# Patient Record
Sex: Male | Born: 1970
Health system: Southern US, Community
[De-identification: ages and names within clinical notes are randomized; demographics above are authoritative.]

## PROBLEM LIST (undated history)

## (undated) DIAGNOSIS — D582 Other hemoglobinopathies: Secondary | ICD-10-CM

## (undated) DIAGNOSIS — F32A Depression, unspecified: Secondary | ICD-10-CM

## (undated) DIAGNOSIS — F419 Anxiety disorder, unspecified: Secondary | ICD-10-CM

## (undated) DIAGNOSIS — R7989 Other specified abnormal findings of blood chemistry: Secondary | ICD-10-CM

## (undated) DIAGNOSIS — G4730 Sleep apnea, unspecified: Secondary | ICD-10-CM

## (undated) DIAGNOSIS — F329 Major depressive disorder, single episode, unspecified: Secondary | ICD-10-CM

## (undated) DIAGNOSIS — K56609 Unspecified intestinal obstruction, unspecified as to partial versus complete obstruction: Secondary | ICD-10-CM

## (undated) HISTORY — DX: Other hemoglobinopathies: D58.2

## (undated) HISTORY — PX: WISDOM TOOTH EXTRACTION: SHX21

## (undated) HISTORY — PX: LUMBAR SPINE SURGERY: SHX701

## (undated) HISTORY — DX: Sleep apnea, unspecified: G47.30

## (undated) HISTORY — DX: Anxiety disorder, unspecified: F41.9

## (undated) HISTORY — DX: Other specified abnormal findings of blood chemistry: R79.89

## (undated) HISTORY — DX: Major depressive disorder, single episode, unspecified: F32.9

## (undated) HISTORY — DX: Depression, unspecified: F32.A

---

## 2005-05-04 ENCOUNTER — Other Ambulatory Visit: Payer: Self-pay

## 2005-05-04 ENCOUNTER — Emergency Department: Payer: Self-pay | Admitting: General Practice

## 2013-01-06 ENCOUNTER — Telehealth: Payer: Self-pay | Admitting: Nurse Practitioner

## 2013-01-06 NOTE — Telephone Encounter (Signed)
Weakness and fatigue x 1 week.  Denies other symptoms.    Requested Bennie Pierini, FNP.  Appt scheduled for tomorrow.

## 2013-01-07 ENCOUNTER — Ambulatory Visit (INDEPENDENT_AMBULATORY_CARE_PROVIDER_SITE_OTHER): Payer: Medicaid Other | Admitting: Nurse Practitioner

## 2013-01-07 ENCOUNTER — Encounter: Payer: Self-pay | Admitting: Nurse Practitioner

## 2013-01-07 VITALS — BP 127/78 | HR 109 | Temp 99.8°F | Ht 75.0 in | Wt 266.0 lb

## 2013-01-07 DIAGNOSIS — F329 Major depressive disorder, single episode, unspecified: Secondary | ICD-10-CM

## 2013-01-07 DIAGNOSIS — R5381 Other malaise: Secondary | ICD-10-CM

## 2013-01-07 DIAGNOSIS — R5383 Other fatigue: Secondary | ICD-10-CM

## 2013-01-07 NOTE — Patient Instructions (Signed)
Sleep Apnea  Sleep apnea is a sleep disorder characterized by abnormal pauses in breathing while you sleep. When your breathing pauses, the level of oxygen in your blood decreases. This causes you to move out of deep sleep and into light sleep. As a result, your quality of sleep is poor, and the system that carries your blood throughout your body (cardiovascular system) experiences stress. If sleep apnea remains untreated, the following conditions can develop:  High blood pressure (hypertension).  Coronary artery disease.  Inability to achieve or maintain an erection (impotence).  Impairment of your thought process (cognitive dysfunction). There are three types of sleep apnea: 1. Obstructive sleep apnea Pauses in breathing during sleep because of a blocked airway. 2. Central sleep apnea Pauses in breathing during sleep because the area of the brain that controls your breathing does not send the correct signals to the muscles that control breathing. 3. Mixed sleep apnea A combination of both obstructive and central sleep apnea. RISK FACTORS The following risk factors can increase your risk of developing sleep apnea:  Being overweight.  Smoking.  Having narrow passages in your nose and throat.  Being of older age.  Being male.  Alcohol use.  Sedative and tranquilizer use.  Ethnicity. Among individuals younger than 35 years, African Americans are at increased risk of sleep apnea. SYMPTOMS   Difficulty staying asleep.  Daytime sleepiness and fatigue.  Loss of energy.  Irritability.  Loud, heavy snoring.  Morning headaches.  Trouble concentrating.  Forgetfulness.  Decreased interest in sex. DIAGNOSIS  In order to diagnose sleep apnea, your caregiver will perform a physical examination. Your caregiver may suggest that you take a home sleep test. Your caregiver may also recommend that you spend the night in a sleep lab. In the sleep lab, several monitors record  information about your heart, lungs, and brain while you sleep. Your leg and arm movements and blood oxygen level are also recorded. TREATMENT The following actions may help to resolve mild sleep apnea:  Sleeping on your side.   Using a decongestant if you have nasal congestion.   Avoiding the use of depressants, including alcohol, sedatives, and narcotics.   Losing weight and modifying your diet if you are overweight. There also are devices and treatments to help open your airway:  Oral appliances. These are custom-made mouthpieces that shift your lower jaw forward and slightly open your bite. This opens your airway.  Devices that create positive airway pressure. This positive pressure "splints" your airway open to help you breathe better during sleep. The following devices create positive airway pressure:  Continuous positive airway pressure (CPAP) device. The CPAP device creates a continuous level of air pressure with an air pump. The air is delivered to your airway through a mask while you sleep. This continuous pressure keeps your airway open.  Nasal expiratory positive airway pressure (EPAP) device. The EPAP device creates positive air pressure as you exhale. The device consists of single-use valves, which are inserted into each nostril and held in place by adhesive. The valves create very little resistance when you inhale but create much more resistance when you exhale. That increased resistance creates the positive airway pressure. This positive pressure while you exhale keeps your airway open, making it easier to breath when you inhale again.  Bilevel positive airway pressure (BPAP) device. The BPAP device is used mainly in patients with central sleep apnea. This device is similar to the CPAP device because it also uses an air pump to deliver   continuous air pressure through a mask. However, with the BPAP machine, the pressure is set at two different levels. The pressure when you  exhale is lower than the pressure when you inhale.  Surgery. Typically, surgery is only done if you cannot comply with less invasive treatments or if the less invasive treatments do not improve your condition. Surgery involves removing excess tissue in your airway to create a wider passage way. Document Released: 09/06/2002 Document Revised: 03/17/2012 Document Reviewed: 01/23/2012 [employer] Patient Information 2013 [employer].  

## 2013-01-07 NOTE — Progress Notes (Signed)
  Subjective:    Patient ID: Emmanuelle Coxe, male    DOB: Oct 15, 1970, 42 y.o.   MRN: [id number]  HPI-Patient in C/O fatigue. Feels very weak. Stays sleepy all the time even though he sleeps all night long. Says that he does snore at night. Patient states that he just never feels rested.    Review of Systems  Constitutional: Positive for fatigue.  HENT: Negative.   Eyes: Negative.   Respiratory: Negative.   Cardiovascular: Negative.   Gastrointestinal: Positive for abdominal distention.  Genitourinary: Negative.   Musculoskeletal: Negative.   Neurological: Negative.   Psychiatric/Behavioral: Negative.        Patient feels that his depression is under control.       Objective:   Physical Exam  Constitutional: He is oriented to person, place, and time. He appears well-developed and well-nourished.  Cardiovascular: Normal rate, regular rhythm and intact distal pulses.   No murmur heard. Pulmonary/Chest: Effort normal and breath sounds normal.  Abdominal: Soft. Bowel sounds are normal.  Neurological: He is alert and oriented to person, place, and time.  Skin: Skin is warm and dry.  Psychiatric: He has a normal mood and affect. His behavior is normal. Judgment and thought content normal.   BP 127/78  Pulse 109  Temp(Src) 99.8 F (37.7 C) (Oral)  Ht 6\' 3"  (1.905 m)  Wt 266 lb (120.657 kg)  BMI 33.25 kg/m2        Assessment & Plan:  Fatigue  Will schedule Sleep study  Labs pending  Will talk once gets all results back   Orders Placed This Encounter  Procedures  . COMPLETE METABOLIC PANEL WITH GFR  . Anemia panel 7  . Thyroid Panel With TSH  . Polysomnography 4 or more parameters     Mary-Margaret Daphine Deutscher, FNP

## 2013-01-08 LAB — ANEMIA PANEL 7
ABS Retic: 32.6 10*3/uL (ref 19.0–186.0)
HCT: 50.1 % (ref 39.0–52.0)
Iron: 66 ug/dL (ref 42–165)
Platelets: 307 10*3/uL (ref 150–400)
RBC.: 5.44 MIL/uL (ref 4.22–5.81)
RBC: 5.44 MIL/uL (ref 4.22–5.81)
Retic Ct Pct: 0.6 % (ref 0.4–2.3)
UIBC: 291 ug/dL (ref 125–400)
Vitamin B-12: 497 pg/mL (ref 211–911)
WBC: 9.1 10*3/uL (ref 4.0–10.5)

## 2013-01-08 LAB — COMPLETE METABOLIC PANEL WITH GFR
BUN: 13 mg/dL (ref 6–23)
CO2: 30 mEq/L (ref 19–32)
Calcium: 10 mg/dL (ref 8.4–10.5)
Chloride: 102 mEq/L (ref 96–112)
Creat: 1.12 mg/dL (ref 0.50–1.35)
GFR, Est African American: >89 mL/min
GFR, Est Non African American: 81 mL/min
Total Bilirubin: 0.7 mg/dL (ref 0.3–1.2)

## 2013-01-08 LAB — THYROID PANEL WITH TSH
Free Thyroxine Index: 2.8 (ref 1.0–3.9)
T3 Uptake: 30.3 % (ref 22.5–37.0)
T4, Total: 9.1 ug/dL (ref 5.0–12.5)
TSH: 2.876 u[IU]/mL (ref 0.350–4.500)

## 2013-01-11 ENCOUNTER — Encounter: Payer: Self-pay | Admitting: Family Medicine

## 2013-01-20 ENCOUNTER — Other Ambulatory Visit (HOSPITAL_COMMUNITY): Payer: Self-pay

## 2013-01-20 ENCOUNTER — Other Ambulatory Visit: Payer: Self-pay

## 2013-01-20 DIAGNOSIS — G4730 Sleep apnea, unspecified: Secondary | ICD-10-CM

## 2013-01-21 ENCOUNTER — Telehealth: Payer: Self-pay | Admitting: Nurse Practitioner

## 2013-01-21 NOTE — Telephone Encounter (Signed)
FIRST AVAIL APPT IS Monday WITH XRAY HERE- THAT WAS OFFERED BUT PT MAY TRY URGENT FIRST.

## 2013-01-25 ENCOUNTER — Ambulatory Visit: Payer: Medicaid Other | Attending: Family Medicine | Admitting: Sleep Medicine

## 2013-01-25 DIAGNOSIS — G4733 Obstructive sleep apnea (adult) (pediatric): Secondary | ICD-10-CM | POA: Insufficient documentation

## 2013-01-25 DIAGNOSIS — G4730 Sleep apnea, unspecified: Secondary | ICD-10-CM

## 2013-01-25 DIAGNOSIS — G4710 Hypersomnia, unspecified: Secondary | ICD-10-CM

## 2013-01-25 DIAGNOSIS — Z6833 Body mass index (BMI) 33.0-33.9, adult: Secondary | ICD-10-CM | POA: Insufficient documentation

## 2013-02-03 NOTE — Procedures (Signed)
[facility] Daeshon Grammatico A. Gerilyn Pilgrim, MD     [email or web address]        NAMELEVESTER, WALDRIDGE                 ACCOUNT NO.:  [id number]  MEDICAL RECORD NO.:  [id number]          PATIENT TYPE:  OUT  LOCATION:  SLEEP LAB                     FACILITY:  [facility]  PHYSICIAN:  Freedom Lopezperez A. Gerilyn Pilgrim, M.D. DATE OF BIRTH:  11-26-1970  DATE OF STUDY:  01/25/2013                           NOCTURNAL POLYSOMNOGRAM  REFERRING PHYSICIAN:  MARY-MARGARET Fesler  REFERRING PHYSICIAN:  Ernestina Penna, M.D.  INDICATION:  A 42 year old, who presents with fatigue, snoring, and obesity.  MEDICATIONS:  Trazodone, clonazepam, prednisone, Cymbalta.  Epworth Sleepiness Scale 10.  BMI 33.  ARCHITECTURAL SUMMARY:  The total recording time is 411 minutes.  Sleep efficiency 70%, sleep latency 27 minutes.  REM latency 105 minutes. Stage N1 4%, N2 51%, N3 31%, and REM sleep 30%.  RESPIRATORY SUMMARY:  Baseline oxygen saturation is 92, lowest saturation 82 during REM sleep.  Diagnostic AHI is 8 and RDI also 8. The events tended to occur during the REM sleep with a REM AHI of 32.  LIMB MOVEMENT SUMMARY:  PLM index 0.  ELECTROCARDIOGRAM SUMMARY:  Average heart rate is 59 with no significant arrhythmias observed.  IMPRESSION:  Mild obstructive sleep apnea syndrome, not requiring positive pressure treatment.  Thanks for this referral.     Lezly Rumpf A. Gerilyn Pilgrim, M.D.    KAD/MEDQ  D:  02/03/2013 09:28:19  T:  02/03/2013 09:40:23  Job:  [id number]

## 2013-02-11 ENCOUNTER — Telehealth: Payer: Self-pay | Admitting: Nurse Practitioner

## 2013-02-11 NOTE — Telephone Encounter (Signed)
Pt aware of sleep study report

## 2013-04-20 ENCOUNTER — Other Ambulatory Visit: Payer: Self-pay | Admitting: Nurse Practitioner

## 2013-04-20 ENCOUNTER — Other Ambulatory Visit (INDEPENDENT_AMBULATORY_CARE_PROVIDER_SITE_OTHER): Payer: Medicaid Other

## 2013-04-20 DIAGNOSIS — R5381 Other malaise: Secondary | ICD-10-CM

## 2013-04-20 DIAGNOSIS — R5383 Other fatigue: Secondary | ICD-10-CM

## 2013-04-20 NOTE — Progress Notes (Signed)
Pt came in for labs only 

## 2013-04-21 ENCOUNTER — Telehealth: Payer: Self-pay | Admitting: Nurse Practitioner

## 2013-04-22 ENCOUNTER — Telehealth: Payer: Self-pay | Admitting: Nurse Practitioner

## 2013-04-22 LAB — TESTOSTERONE, TOTAL AND FREE DIRECT MEASURE: Testosterone: 199 ng/dL — ABNORMAL LOW (ref 300–890)

## 2013-04-23 ENCOUNTER — Ambulatory Visit (INDEPENDENT_AMBULATORY_CARE_PROVIDER_SITE_OTHER): Payer: Medicaid Other | Admitting: Family Medicine

## 2013-04-23 ENCOUNTER — Encounter: Payer: Self-pay | Admitting: Family Medicine

## 2013-04-23 VITALS — BP 136/90 | HR 102 | Temp 98.8°F | Ht 75.0 in | Wt 257.0 lb

## 2013-04-23 DIAGNOSIS — E291 Testicular hypofunction: Secondary | ICD-10-CM

## 2013-04-23 DIAGNOSIS — L408 Other psoriasis: Secondary | ICD-10-CM

## 2013-04-23 DIAGNOSIS — F329 Major depressive disorder, single episode, unspecified: Secondary | ICD-10-CM

## 2013-04-23 DIAGNOSIS — L409 Psoriasis, unspecified: Secondary | ICD-10-CM

## 2013-04-23 LAB — PSA: PSA: 0.47 ng/mL (ref <=4.00)

## 2013-04-23 MED ORDER — CLOBETASOL PROPIONATE 0.05 % EX CREA: TOPICAL_CREAM | Freq: Two times a day (BID) | CUTANEOUS | Status: DC

## 2013-04-23 MED ORDER — CLONAZEPAM 0.5 MG PO TABS: 0.5000 mg | ORAL_TABLET | Freq: Two times a day (BID) | ORAL | Status: DC | PRN

## 2013-04-23 MED ORDER — TESTOSTERONE CYPIONATE 200 MG/ML IM SOLN: INTRAMUSCULAR | Status: DC

## 2013-04-23 MED ORDER — METHYLPREDNISOLONE (PAK) 4 MG PO TABS: ORAL_TABLET | ORAL | Status: DC

## 2013-04-23 MED ORDER — SERTRALINE HCL 50 MG PO TABS: 50.0000 mg | ORAL_TABLET | Freq: Every day | ORAL | Status: DC

## 2013-04-23 NOTE — Progress Notes (Signed)
  Subjective:    Patient ID: Paul Parrish, male    DOB: 26-Jul-1971, 42 y.o.   MRN: [id number]  HPI This 42 y.o. male presents for evaluation of fatigue and weakness.  He has hx of depression And has had to have ECT therapy in the past.  He has been using his cymbalta 60mg  qod to Make them last.  He states cymbalta has not been working and he would like to try something Else.  He has had a testosterone check and he has low Total Testosterone of 199 and free of 3.3. He has been having some problems with rash on his bilateral arms and he hx of psoriasis.  He Doesn't want to see his psychiatrist anymore because it is too expensive and hasn't been to  See him for some time.  He has taken clonazepam in the past for severe anxiety.  He states his Wife tells him he is moody..  Review of Systems C/o rash, depression, and fatigue. No chest pain, SOB, HA, dizziness, vision change, N/V, diarrhea, constipation, dysuria, urinary urgency or frequency, myalgias, arthralgias.     Objective:   Physical Exam Vital signs noted  Well developed well nourished depressed male.  HEENT - Head atraumatic Normocephalic                Eyes - PERRLA, Conjuctiva - clear Sclera- Clear EOMI                Ears - EAC's Wnl TM's Wnl Gross Hearing WNL                Nose - Nares patent                 Throat - oropharanx wnl Respiratory - Lungs CTA bilateral Cardiac - RRR S1 and S2 without murmur GI - Abdomen soft Nontender and bowel sounds active x 4 Extremities - No edema. Neuro - Grossly intact.       Assessment & Plan:  Psoriasis - Plan: clobetasol cream (TEMOVATE) 0.05 %, methylPREDNIsolone (MEDROL DOSPACK) 4 MG tablet  Depression - DC cymbalta and start sertrline and clonazepam.    Psoriasis - Medrol dose pack as directed, clobetasol cream.

## 2013-04-23 NOTE — Patient Instructions (Signed)
Needs Tdap at next well visit.

## 2013-04-23 NOTE — Telephone Encounter (Signed)
Pt coming today at 10:30 and he said he would have [employer] give him results then

## 2013-05-03 ENCOUNTER — Telehealth: Payer: Self-pay | Admitting: Family Medicine

## 2013-05-03 NOTE — Telephone Encounter (Signed)
Needs to follow up.  If he cannot get out of bed perhaps he may need more advanced care and may need to go to the ED.

## 2013-05-03 NOTE — Telephone Encounter (Signed)
After his injection testosterone cant get out of bed no energy not doing any better is worse than before the injection

## 2013-05-05 NOTE — Telephone Encounter (Signed)
Appt scheduled with Ander Slade, FNP for 05/06/13.  Patient's wife is aware. She states that he won't get out bed.  He is calling in sick to work and won't eat.  [place] and wouldn't leave the bed there.

## 2013-05-06 ENCOUNTER — Other Ambulatory Visit: Payer: Self-pay | Admitting: Family Medicine

## 2013-05-06 ENCOUNTER — Ambulatory Visit: Payer: Medicaid Other | Admitting: Family Medicine

## 2013-05-06 DIAGNOSIS — L409 Psoriasis, unspecified: Secondary | ICD-10-CM

## 2013-05-10 ENCOUNTER — Other Ambulatory Visit: Payer: Self-pay | Admitting: Family Medicine

## 2013-05-10 DIAGNOSIS — L409 Psoriasis, unspecified: Secondary | ICD-10-CM

## 2013-05-10 MED ORDER — CLOBETASOL PROPIONATE 0.05 % EX CREA: TOPICAL_CREAM | Freq: Two times a day (BID) | CUTANEOUS | Status: DC

## 2013-05-10 NOTE — Telephone Encounter (Signed)
PT AWARE RX SENT TO HIS PHARMACY

## 2013-05-10 NOTE — Telephone Encounter (Signed)
Cream sent to pharmacy.

## 2013-05-24 ENCOUNTER — Other Ambulatory Visit: Payer: Self-pay | Admitting: Family Medicine

## 2013-05-25 ENCOUNTER — Encounter: Payer: Self-pay | Admitting: Family Medicine

## 2013-05-25 ENCOUNTER — Ambulatory Visit (INDEPENDENT_AMBULATORY_CARE_PROVIDER_SITE_OTHER): Payer: Medicaid Other | Admitting: Family Medicine

## 2013-05-25 VITALS — BP 126/83 | HR 82 | Temp 97.9°F | Ht 75.0 in | Wt 264.4 lb

## 2013-05-25 DIAGNOSIS — F99 Mental disorder, not otherwise specified: Secondary | ICD-10-CM

## 2013-05-25 DIAGNOSIS — F489 Nonpsychotic mental disorder, unspecified: Secondary | ICD-10-CM

## 2013-05-25 NOTE — Progress Notes (Signed)
  Subjective:    Patient ID: Paul Parrish, male    DOB: 1971-05-13, 42 y.o.   MRN: [id number]  HPI This 42 y.o. male presents for evaluation of weakness and fatigue and is accompanied by His wife. He was seen 04/23/13 for fatigue.   He has hx of depression and anxiety And was on cymbalta.  He c/o cymbalta not working and was taking the cymbalta Qod.  He was told to titrate off cymbalta and was rx'd sertraline 50mg  po qd and  Clonazepam 05mg  bid prn anxiety.  He also had labs drawn and was found to be hypogonadal And then was rx'd testosterone cyp 200mg  IM biweekly.  Since his wife has called and  C/o patient not feeling better and was laying in bed and not going to work.  Patient was advised to Follow up.  Yesterday Dr. Christell Constant was seeing the patient's father in the office and the father was  Telling Dr. Christell Constant about his son becoming violent and threatening to kill his family and burn ther house down. I ask the patient today if he did threaten his family and he admits to threatening to kill his family by burning down The house.  He admits to having violent mood swings.  He admits to having difficulty sleeping and feeling Tired.  He denies homicidal or suicidal ideation at present.  He refuses to be admitted to a psychiatric  Facility because he is worried about his job.   .   Review of Systems C/o anxiety, fatigue, and violent mood swings   No chest pain, SOB, HA, dizziness, vision change, N/V, diarrhea, constipation, dysuria, urinary urgency or frequency, myalgias, arthralgias or rash.  Objective:   Physical Exam Vital signs noted  Pale and anxious male in NAD.  HEENT - Head atraumatic Normocephalic                Eyes - PERRLA, Conjuctiva - clear Sclera- Clear EOMI                Ears - EAC's Wnl TM's Wnl Gross Hearing WNL                Nose - Nares patent                 Throat - oropharanx wnl Respiratory - Lungs CTA bilateral Cardiac - RRR S1 and S2 without murmur GI - Abdomen  soft Nontender and bowel sounds active x 4 Extremities - No edema. Neuro - Grossly intact.       Assessment & Plan:  Psychiatric illness Recommend Psychiatry ASAP.  He states he does not want to be admitted and would agree to see a psychiatrist ASAP. Discussed with Dr. Christell Constant patient's H&P and [facility] in [place] is called and I have spoke to  Staff there and they are going to show the note to the Psychiatrist and call back with information or appointment. Behavioral staff in [place] call back and they recommend patient be seen in [place] office and so this Office is called and I speak with staff there who inform me that  Patient's insurance is not accepted at the facility and So I am referred to [facility] facility who accept patient and recommend patient come to be seen as walk in Immediately.  Patient is given directions and phone number and patient is advised to DC testosterone and follow up after Seeing Psychiatric provider at [facility].

## 2013-05-25 NOTE — Patient Instructions (Signed)

## 2013-07-16 ENCOUNTER — Other Ambulatory Visit (INDEPENDENT_AMBULATORY_CARE_PROVIDER_SITE_OTHER): Payer: Medicaid Other

## 2013-07-16 DIAGNOSIS — E291 Testicular hypofunction: Secondary | ICD-10-CM

## 2013-07-18 LAB — TESTOSTERONE,FREE AND TOTAL
Testosterone, Free: 20.6 pg/mL (ref 6.8–21.5)
Testosterone: 789 ng/dL (ref 348–1197)

## 2013-07-23 ENCOUNTER — Telehealth: Payer: Self-pay | Admitting: Family Medicine

## 2013-07-23 ENCOUNTER — Other Ambulatory Visit: Payer: Self-pay | Admitting: Family Medicine

## 2013-07-26 NOTE — Telephone Encounter (Signed)
Last filled 04/23/13 with 2 refills, last seen 08/26 Print or have nurse call in to [facility]

## 2013-07-28 NOTE — Telephone Encounter (Signed)
LM on vm, normal.

## 2013-07-28 NOTE — Telephone Encounter (Signed)
Pt notified that RX for Testosterone ready for pick up RX to front

## 2013-08-05 ENCOUNTER — Other Ambulatory Visit: Payer: Self-pay

## 2013-08-13 ENCOUNTER — Ambulatory Visit (INDEPENDENT_AMBULATORY_CARE_PROVIDER_SITE_OTHER): Payer: Medicaid Other | Admitting: Family Medicine

## 2013-08-13 ENCOUNTER — Encounter: Payer: Self-pay | Admitting: Family Medicine

## 2013-08-13 VITALS — BP 127/85 | HR 76 | Temp 98.0°F | Ht 73.0 in | Wt 264.0 lb

## 2013-08-13 DIAGNOSIS — E162 Hypoglycemia, unspecified: Secondary | ICD-10-CM

## 2013-08-13 DIAGNOSIS — E291 Testicular hypofunction: Secondary | ICD-10-CM

## 2013-08-13 LAB — POCT CBC
Granulocyte percent: 54.9 %G (ref 37–80)
HCT, POC: 58.8 % — AB (ref 43.5–53.7)
Hemoglobin: 19.4 g/dL — AB (ref 14.1–18.1)
Lymph, poc: 3.2 (ref 0.6–3.4)
MCH, POC: 28.9 pg (ref 27–31.2)
MCHC: 33 g/dL (ref 31.8–35.4)
MCV: 87.5 fL (ref 80–97)
MPV: 7.7 fL (ref 0–99.8)
POC Granulocyte: 4.3 (ref 2–6.9)
POC LYMPH PERCENT: 39.9 %L (ref 10–50)
Platelet Count, POC: 267 10*3/uL (ref 142–424)
RBC: 6.7 M/uL — AB (ref 4.69–6.13)
RDW, POC: 13.9 %
WBC: 7.9 10*3/uL (ref 4.6–10.2)

## 2013-08-13 LAB — POCT GLYCOSYLATED HEMOGLOBIN (HGB A1C): Hemoglobin A1C: 4.9

## 2013-08-13 NOTE — Patient Instructions (Signed)

## 2013-08-13 NOTE — Progress Notes (Signed)
  Subjective:    Patient ID: Paul Parrish, male    DOB: Nov 15, 1970, 42 y.o.   MRN: [id number]  HPI  This 42 y.o. male presents for evaluation of low blood sugar.  He states he has  Been having low blood sugars in the 40's.  He states he has been having These in the evening before he goes to bed.  He states he has been having some Elevated blood pressure when at the dentist.  He has hx of depression and has been Started on effexor xr.  He has been having a tooth abcess and is on abx's.  He Has been dx with hypogonadism and is taking testosterone injections biweekly. He has had a testosterone level last month and it was normal.  Review of Systems No chest pain, SOB, HA, dizziness, vision change, N/V, diarrhea, constipation, dysuria, urinary urgency or frequency, myalgias, arthralgias or rash.     Objective:   Physical Exam Vital signs noted  Well developed well nourished male.  HEENT - Head atraumatic Normocephalic                Eyes - PERRLA, Conjuctiva - clear Sclera- Clear EOMI                Ears - EAC's Wnl TM's Wnl Gross Hearing WNL                Nose - Nares patent                 Throat - oropharanx wnl Respiratory - Lungs CTA bilateral Cardiac - RRR S1 and S2 without murmur GI - Abdomen soft Nontender and bowel sounds active x 4 Extremities - No edema. Neuro - Grossly intact.       Assessment & Plan:  Hypoglycemia - Plan: POCT glycosylated hemoglobin (Hb A1C), CMP14+EGFR. Discussed with patient that he needs to ear low carb diet and at least eat 3 to 4 times a day Or ac and hs.  He may want to back off on the testosterone to monthly instead of biweekly Please follow up with Tammy Eckerd Pharm D for eval and tx.  May also be due to dental abcess and abx's.  Hypogonadism male - Plan: POCT glycosylated hemoglobin (Hb A1C), CMP14+EGFR, PSA, total and free, POCT CBC.  Decrease IM testosterone to monthly and repeat testosterone, cmp, cbc, in 2 - 3 months.  Depression -  Continue effexor xr and followup with psychiatry.  Follow up in 3 months  Paul Canter FNP Elevated BP - Bp is normal 120/80 left arm and was elevated because he was at dentist.

## 2013-08-14 LAB — CMP14+EGFR
ALT: 19 IU/L (ref 0–44)
AST: 22 IU/L (ref 0–40)
Albumin/Globulin Ratio: 1.7 (ref 1.1–2.5)
Albumin: 4.5 g/dL (ref 3.5–5.5)
Alkaline Phosphatase: 63 IU/L (ref 39–117)
BUN/Creatinine Ratio: 11 (ref 9–20)
BUN: 10 mg/dL (ref 6–24)
CO2: 22 mmol/L (ref 18–29)
Calcium: 9.7 mg/dL (ref 8.7–10.2)
Chloride: 98 mmol/L (ref 97–108)
Creatinine, Ser: 0.91 mg/dL (ref 0.76–1.27)
GFR calc Af Amer: 120 mL/min/{1.73_m2} (ref >59)
GFR calc non Af Amer: 104 mL/min/{1.73_m2} (ref >59)
Globulin, Total: 2.7 g/dL (ref 1.5–4.5)
Glucose: 78 mg/dL (ref 65–99)
Potassium: 4.2 mmol/L (ref 3.5–5.2)
Sodium: 138 mmol/L (ref 134–144)
Total Bilirubin: 0.6 mg/dL (ref 0.0–1.2)
Total Protein: 7.2 g/dL (ref 6.0–8.5)

## 2013-08-14 LAB — PSA, TOTAL AND FREE
PSA, Free Pct: 17.5 %
PSA, Free: 0.14 ng/mL
PSA: 0.8 ng/mL (ref 0.0–4.0)

## 2013-08-16 ENCOUNTER — Other Ambulatory Visit: Payer: Self-pay | Admitting: Family Medicine

## 2013-08-16 DIAGNOSIS — D751 Secondary polycythemia: Secondary | ICD-10-CM

## 2013-09-07 ENCOUNTER — Ambulatory Visit (INDEPENDENT_AMBULATORY_CARE_PROVIDER_SITE_OTHER): Payer: Medicaid Other

## 2013-09-07 DIAGNOSIS — Z23 Encounter for immunization: Secondary | ICD-10-CM

## 2013-09-21 ENCOUNTER — Telehealth: Payer: Self-pay | Admitting: *Deleted

## 2013-09-21 NOTE — Telephone Encounter (Signed)
Read lab results per Traci to wife.  She will make sure he gets therapeutic lab at [facility]. Lab and will cut back to teatosterone monthly, then come back here for cbc.  Wife wants to know does Yazir need order to take to [facility].  Call her at [phone].

## 2013-09-21 NOTE — Telephone Encounter (Signed)
Message copied by Baltazar Apo on Tue Sep 21, 2013  1:10 PM ------      Message from: Deatra Canter      Created: Mon Aug 16, 2013  2:26 PM       He has very high hgb and hct due testosterone.  Would recommend he cut back on the       Testosterone to monthly and would get therapeutic phlebotomy at the Lab at [facility]      Would get therapeutic phlebotomy done and then repeat a cbc here at the office.  Cut      Back on the testosterone to monthly. ------

## 2013-09-22 NOTE — Telephone Encounter (Signed)
Corrie Dandy can you p[lease send order over to the hospital for this patient bill oxford wanted it done.

## 2013-09-27 ENCOUNTER — Other Ambulatory Visit: Payer: Self-pay | Admitting: Family Medicine

## 2013-09-27 DIAGNOSIS — D751 Secondary polycythemia: Secondary | ICD-10-CM

## 2013-09-27 NOTE — Telephone Encounter (Signed)
Bill can u please take care of this- i didn't have time last week

## 2013-10-07 ENCOUNTER — Ambulatory Visit (INDEPENDENT_AMBULATORY_CARE_PROVIDER_SITE_OTHER): Payer: Medicaid Other | Admitting: Nurse Practitioner

## 2013-10-07 ENCOUNTER — Encounter: Payer: Self-pay | Admitting: Nurse Practitioner

## 2013-10-07 VITALS — BP 140/90 | HR 97 | Temp 98.0°F | Ht 73.0 in | Wt 264.0 lb

## 2013-10-07 DIAGNOSIS — J019 Acute sinusitis, unspecified: Secondary | ICD-10-CM

## 2013-10-07 DIAGNOSIS — B9689 Other specified bacterial agents as the cause of diseases classified elsewhere: Secondary | ICD-10-CM

## 2013-10-07 MED ORDER — HYDROCODONE-HOMATROPINE 5-1.5 MG/5ML PO SYRP: 5.0000 mL | ORAL_SOLUTION | Freq: Three times a day (TID) | ORAL | Status: DC | PRN

## 2013-10-07 MED ORDER — AZITHROMYCIN 250 MG PO TABS: ORAL_TABLET | ORAL | Status: DC

## 2013-10-07 NOTE — Patient Instructions (Signed)

## 2013-10-07 NOTE — Progress Notes (Signed)
   Subjective:    Patient ID: Paul Parrish, male    DOB: Nov 26, 1970, 43 y.o.   MRN: [id number]  HPI  Patient in today c/o cough and congestion that started 4 days ago. OTC sinus meds an ddelsym- no better.    Review of Systems  Constitutional: Negative for fever, chills and appetite change.  HENT: Positive for congestion, postnasal drip, rhinorrhea and sinus pressure. Negative for ear pain, sore throat and trouble swallowing.   Respiratory: Positive for cough (slightly productive).   Cardiovascular: Negative.   All other systems reviewed and are negative.       Objective:   Physical Exam  Constitutional: He is oriented to person, place, and time. He appears well-developed and well-nourished.  HENT:  Right Ear: Hearing, external ear and ear canal normal. Tympanic membrane is erythematous (mild).  Left Ear: Hearing, external ear and ear canal normal. Tympanic membrane is erythematous (mild).  Nose: Mucosal edema and rhinorrhea present. Right sinus exhibits maxillary sinus tenderness. Right sinus exhibits no frontal sinus tenderness. Left sinus exhibits maxillary sinus tenderness. Left sinus exhibits no frontal sinus tenderness.  Mouth/Throat: Uvula is midline, oropharynx is clear and moist and mucous membranes are normal.  Cardiovascular: Normal rate and normal heart sounds.   Pulmonary/Chest: Effort normal and breath sounds normal.  Neurological: He is alert and oriented to person, place, and time.  Skin: Skin is warm and dry.    BP 140/90  Pulse 97  Temp(Src) 98 F (36.7 C) (Oral)  Ht 6\' 1"  (1.854 m)  Wt 264 lb (119.75 kg)  BMI 34.84 kg/m2        Assessment & Plan:   1. Acute bacterial rhinosinusitis    Meds ordered this encounter  Medications  . azithromycin (ZITHROMAX Z-PAK) 250 MG tablet    Sig: As directed    Dispense:  6 each    Refill:  0    Order Specific Question:  Supervising Provider    Answer:  Chipper Herb [[id number]]  . HYDROcodone-homatropine (HYCODAN)  5-1.5 MG/5ML syrup    Sig: Take 5 mLs by mouth every 8 (eight) hours as needed for cough.    Dispense:  120 mL    Refill:  0    Order Specific Question:  Supervising Provider    Answer:  Chipper Herb [[id number]]   1. Take meds as prescribed 2. Use a cool mist humidifier especially during the winter months and when heat has  been humid. 3. Use saline nose sprays frequently 4. Saline irrigations of the nose can be very helpful if done frequently.  * 4X daily for 1 week*  * Use of a nettie pot can be helpful with this. Follow directions with this* 5. Drink plenty of fluids 6. Keep thermostat turn down low 7.For any cough or congestion  Use plain Mucinex- regular strength or max strength is fine   * Children- consult with Pharmacist for dosing 8. For fever or aces or pains- take tylenol or ibuprofen appropriate for age and weight.  * for fevers greater than 101 orally you may alternate ibuprofen and tylenol every  3 hours.   Mary-Margaret Hassell Done, FNP

## 2013-10-14 ENCOUNTER — Other Ambulatory Visit (HOSPITAL_COMMUNITY): Payer: Self-pay | Admitting: *Deleted

## 2013-10-15 ENCOUNTER — Encounter (HOSPITAL_COMMUNITY)
Admission: RE | Admit: 2013-10-15 | Discharge: 2013-10-15 | Disposition: A | Discharge status: Home / Self Care | Payer: Medicaid Other | Source: Ambulatory Visit | Attending: Family Medicine | Admitting: Family Medicine

## 2013-10-15 DIAGNOSIS — D45 Polycythemia vera: Secondary | ICD-10-CM | POA: Insufficient documentation

## 2013-10-15 LAB — CBC
HCT: 52.4 % — ABNORMAL HIGH (ref 39.0–52.0)
Hemoglobin: 19.6 g/dL — ABNORMAL HIGH (ref 13.0–17.0)
MCH: 31.8 pg (ref 26.0–34.0)
MCHC: 37.4 g/dL — ABNORMAL HIGH (ref 30.0–36.0)
MCV: 85.1 fL (ref 78.0–100.0)
Platelets: 269 10*3/uL (ref 150–400)
RBC: 6.16 MIL/uL — ABNORMAL HIGH (ref 4.22–5.81)
RDW: 12.7 % (ref 11.5–15.5)
WBC: 7.7 10*3/uL (ref 4.0–10.5)

## 2013-10-15 LAB — POCT HEMOGLOBIN-HEMACUE: Hemoglobin: 18.9 g/dL — ABNORMAL HIGH (ref 13.0–17.0)

## 2013-10-15 NOTE — Progress Notes (Signed)
Phlebotomized Left a/c by S. Duayne Cal, RN for 80 ml before blood stopped running. Phlebotomized left a/c by Cathey Endow, RN for 420 ml. Tolerated well. CBC obtained post phlebotomy per order.

## 2013-10-18 ENCOUNTER — Other Ambulatory Visit: Payer: Self-pay | Admitting: Family Medicine

## 2013-10-18 DIAGNOSIS — D751 Secondary polycythemia: Secondary | ICD-10-CM

## 2013-10-21 ENCOUNTER — Telehealth: Payer: Self-pay | Admitting: Family Medicine

## 2013-10-22 ENCOUNTER — Telehealth: Payer: Self-pay | Admitting: Family Medicine

## 2013-10-22 NOTE — Telephone Encounter (Signed)
Please advise 

## 2013-10-22 NOTE — Telephone Encounter (Signed)
Discussed with wife. Patient will return for lab test next week and will consider hematology referral.

## 2013-10-22 NOTE — Telephone Encounter (Signed)
Yes take ASA  qd

## 2013-10-25 NOTE — Telephone Encounter (Signed)
Pt noified that he does need to take ASA daily

## 2013-10-27 ENCOUNTER — Other Ambulatory Visit: Payer: Medicaid Other

## 2013-10-27 DIAGNOSIS — D751 Secondary polycythemia: Secondary | ICD-10-CM

## 2013-10-29 ENCOUNTER — Encounter (HOSPITAL_COMMUNITY)
Admission: RE | Admit: 2013-10-29 | Discharge: 2013-10-29 | Disposition: A | Discharge status: Home / Self Care | Payer: Medicaid Other | Source: Ambulatory Visit | Attending: Family Medicine | Admitting: Family Medicine

## 2013-10-29 LAB — LEUKOCYTE ALKALINE PHOSPHATASE: Leukocyte Alkaline Phos Stain: 98 (ref 22–124)

## 2013-10-29 LAB — POCT HEMOGLOBIN-HEMACUE: Hemoglobin: 19 g/dL — ABNORMAL HIGH (ref 13.0–17.0)

## 2013-11-04 ENCOUNTER — Telehealth: Payer: Self-pay | Admitting: *Deleted

## 2013-11-04 NOTE — Telephone Encounter (Signed)
Please call.  Your LAP test came back normal.  (leukocyte alkaline phosphate=measures enzymes(protein), in white blood cells.) (Looking for leukemia.)

## 2013-11-04 NOTE — Telephone Encounter (Signed)
Message copied by Shelbie Ammons on Thu Nov 04, 2013 10:58 AM ------      Message from: Lysbeth Penner      Created: Mon Nov 01, 2013  1:25 PM       LAP is normal value so no polycythemia vera and can get therapeutic phlebotomy ------

## 2013-11-05 NOTE — Telephone Encounter (Signed)
Wife aware

## 2013-11-09 ENCOUNTER — Encounter: Payer: Self-pay | Admitting: Family Medicine

## 2013-11-09 ENCOUNTER — Telehealth: Payer: Self-pay | Admitting: Family Medicine

## 2013-11-09 ENCOUNTER — Ambulatory Visit (INDEPENDENT_AMBULATORY_CARE_PROVIDER_SITE_OTHER): Payer: Medicaid Other | Admitting: Family Medicine

## 2013-11-09 VITALS — BP 130/87 | HR 75 | Temp 98.3°F | Ht 75.0 in | Wt 263.0 lb

## 2013-11-09 DIAGNOSIS — R5383 Other fatigue: Secondary | ICD-10-CM

## 2013-11-09 DIAGNOSIS — R5381 Other malaise: Secondary | ICD-10-CM

## 2013-11-09 LAB — POCT CBC
Granulocyte percent: 57.9 %G (ref 37–80)
HEMATOCRIT: 53.9 % — AB (ref 43.5–53.7)
Hemoglobin: 17.4 g/dL (ref 14.1–18.1)
Lymph, poc: 4.3 — AB (ref 0.6–3.4)
MCH: 29.1 pg (ref 27–31.2)
MCHC: 32.4 g/dL (ref 31.8–35.4)
MCV: 89.9 fL (ref 80–97)
MPV: 7.2 fL (ref 0–99.8)
POC Granulocyte: 6.5 (ref 2–6.9)
POC LYMPH PERCENT: 38.3 %L (ref 10–50)
Platelet Count, POC: 273 10*3/uL (ref 142–424)
RBC: 6 M/uL (ref 4.69–6.13)
RDW, POC: 13.6 %
WBC: 11.2 10*3/uL — AB (ref 4.6–10.2)

## 2013-11-09 NOTE — Addendum Note (Signed)
Addended by: Pollyann Kennedy F on: 11/09/2013 10:29 AM   Modules accepted: Orders

## 2013-11-09 NOTE — Progress Notes (Signed)
   Subjective:    Patient ID: Paul Parrish, male    DOB: 10-31-1970, 43 y.o.   MRN: [id number]  HPI Patient presents today with chief complaint of chronic fatigue. Patient states he's had persistent fatigue since July of last year. Was formally diagnosed with low testosterone at that point has been taking testosterone cypionate IM once to twice monthly. Also has a baseline history of depression is on Effexor for this. States that testosterone has been minimally effective in helping with fatigue. 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 Has been giving therapeutic phlebotomy was as twice a month over the past one to 2 months. Patient does work third shift, physical he is overall poor sleep. Denies any recent infections. No fevers or chills. No outdoor exposures.  No homicidal or suicidal ideations. Wife is with patient today, states that his mood is a real major issue the patient is on Effexor and is followed by outpatient psychiatry.   Review of Systems  All other systems reviewed and are negative.       Objective:   Physical Exam  Constitutional: He is oriented to person, place, and time. He appears well-developed and well-nourished.  HENT:  Head: Normocephalic and atraumatic.  Eyes: Conjunctivae are normal. Pupils are equal, round, and reactive to light.  Neck: Normal range of motion. Neck supple.  Cardiovascular: Normal rate and regular rhythm.   Pulmonary/Chest: Effort normal and breath sounds normal.  Abdominal: Soft.  Musculoskeletal: Normal range of motion.  Neurological: He is alert and oriented to person, place, and time.  Skin: Skin is warm.          Assessment & Plan:  Fatigue - Plan: POCT CBC, Comprehensive metabolic panel, TSH, CK total and CKMB (cardiac), Sedimentation rate, Vitamin D pnl(25-hydrxy+1,25-dihy)-bld, Testosterone, free, total, Vitamin B12, Rocky mtn spotted fvr  abs pnl(IgG+IgM), B. Burgdorfi Antibodies, HIV Antibody   Differential diagnosis for her fatigue is extremely broad including metabolic, psychologic, medication, autoimmune/infectious causes. We'll check baseline labs including CBC, CMP, thyroid, CK, sedimentation rate, vitamin D, RMSF titers.  Polycythemia did not seem to be an issue prior to starting testosterone. Will recheck CBC today. Is on a daily aspirin. We'll consider down titration of testosterone given markedly polycythemia. Would also consider endocrine and hematology/oncology referral if workup is otherwise negative.  Malignancy is also low on the differential diagnosis. May consider early colonoscopy if symptoms persist.

## 2013-11-12 ENCOUNTER — Encounter (HOSPITAL_COMMUNITY)
Admission: RE | Admit: 2013-11-12 | Discharge: 2013-11-12 | Disposition: A | Discharge status: Home / Self Care | Payer: Medicaid Other | Source: Ambulatory Visit | Attending: Family Medicine | Admitting: Family Medicine

## 2013-11-12 DIAGNOSIS — D45 Polycythemia vera: Secondary | ICD-10-CM | POA: Insufficient documentation

## 2013-11-12 LAB — CBC
HCT: 46 % (ref 39.0–52.0)
Hemoglobin: 16.5 g/dL (ref 13.0–17.0)
MCH: 32.4 pg (ref 26.0–34.0)
MCHC: 35.9 g/dL (ref 30.0–36.0)
MCV: 85.7 fL (ref 78.0–100.0)
Platelets: 288 10*3/uL (ref 150–400)
RBC: 5.53 MIL/uL (ref 4.22–5.81)
RDW: 13.2 % (ref 11.5–15.5)
WBC: 8.4 10*3/uL (ref 4.0–10.5)

## 2013-11-12 NOTE — Progress Notes (Signed)
Therapeutic phlebotomy performed without difficulty.  Pre hemoglobin 18.2 via hemocue.  CBC drawn post phlebotomy.  Po's offered.  Will monitor prior to discharge.Marland Kitchen

## 2013-11-13 LAB — COMPREHENSIVE METABOLIC PANEL
A/G RATIO: 1.7 (ref 1.1–2.5)
ALT: 25 IU/L (ref 0–44)
AST: 21 IU/L (ref 0–40)
Albumin: 4.7 g/dL (ref 3.5–5.5)
Alkaline Phosphatase: 76 IU/L (ref 39–117)
BUN / CREAT RATIO: 10 (ref 9–20)
BUN: 11 mg/dL (ref 6–24)
CO2: 21 mmol/L (ref 18–29)
Calcium: 9.6 mg/dL (ref 8.7–10.2)
Chloride: 98 mmol/L (ref 97–108)
Creatinine, Ser: 1.05 mg/dL (ref 0.76–1.27)
GFR calc Af Amer: 101 mL/min/{1.73_m2} (ref >59)
GFR calc non Af Amer: 87 mL/min/{1.73_m2} (ref >59)
Globulin, Total: 2.7 g/dL (ref 1.5–4.5)
Glucose: 98 mg/dL (ref 65–99)
Potassium: 3.9 mmol/L (ref 3.5–5.2)
SODIUM: 139 mmol/L (ref 134–144)
Total Bilirubin: 0.4 mg/dL (ref 0.0–1.2)
Total Protein: 7.4 g/dL (ref 6.0–8.5)

## 2013-11-13 LAB — VITAMIN D 25 HYDROXY (VIT D DEFICIENCY, FRACTURES): Vit D, 25-Hydroxy: 18.1 ng/mL — ABNORMAL LOW (ref 30.0–100.0)

## 2013-11-13 LAB — TESTOSTERONE, FREE, TOTAL, SHBG
TESTOSTERONE FREE: 6.2 pg/mL — AB (ref 6.8–21.5)
TESTOSTERONE, TOTAL: 241.9 ng/dL — AB (ref 348.0–1197.0)

## 2013-11-13 LAB — HIV ANTIBODY (ROUTINE TESTING W REFLEX): HIV 1/HIV 2 AB: NONREACTIVE

## 2013-11-13 LAB — CK TOTAL AND CKMB (NOT AT ARMC)
CK MB INDEX: 4.8 ng/mL (ref 0.0–5.0)
Total CK: 331 U/L — ABNORMAL HIGH (ref 24–204)

## 2013-11-13 LAB — LYME AB/WESTERN BLOT REFLEX: LYME DISEASE AB, QUANT, IGM: <0.8 index (ref 0.00–0.79)

## 2013-11-13 LAB — SEDIMENTATION RATE: Sed Rate: 6 mm/hr (ref 0–15)

## 2013-11-13 LAB — ROCKY MTN SPOTTED FVR ABS PNL(IGG+IGM)
RMSF IgG: NEGATIVE
RMSF IgM: 0.4 index (ref 0.00–0.89)

## 2013-11-13 LAB — VITAMIN B12: VITAMIN B 12: 322 pg/mL (ref 211–946)

## 2013-11-13 LAB — TSH: TSH: 6.29 u[IU]/mL — ABNORMAL HIGH (ref 0.450–4.500)

## 2013-11-15 ENCOUNTER — Telehealth: Payer: Self-pay | Admitting: Family Medicine

## 2013-11-15 LAB — POCT HEMOGLOBIN-HEMACUE: Hemoglobin: 18.2 g/dL — ABNORMAL HIGH (ref 13.0–17.0)

## 2013-11-16 ENCOUNTER — Other Ambulatory Visit: Payer: Self-pay | Admitting: Family Medicine

## 2013-11-16 DIAGNOSIS — R7989 Other specified abnormal findings of blood chemistry: Secondary | ICD-10-CM

## 2013-11-17 ENCOUNTER — Telehealth: Payer: Self-pay | Admitting: Family Medicine

## 2013-11-17 NOTE — Telephone Encounter (Signed)
Patient aware.

## 2013-11-19 ENCOUNTER — Other Ambulatory Visit: Payer: Self-pay | Admitting: *Deleted

## 2013-11-19 DIAGNOSIS — R7989 Other specified abnormal findings of blood chemistry: Secondary | ICD-10-CM

## 2013-11-24 ENCOUNTER — Ambulatory Visit: Payer: Medicaid Other | Admitting: Endocrinology

## 2013-11-26 ENCOUNTER — Encounter (HOSPITAL_COMMUNITY): Payer: Medicaid Other

## 2013-11-30 ENCOUNTER — Encounter: Payer: Self-pay | Admitting: Endocrinology

## 2013-11-30 ENCOUNTER — Ambulatory Visit (INDEPENDENT_AMBULATORY_CARE_PROVIDER_SITE_OTHER): Payer: Medicaid Other | Admitting: Endocrinology

## 2013-11-30 VITALS — BP 124/82 | HR 78 | Temp 97.6°F | Ht 75.0 in | Wt 265.0 lb

## 2013-11-30 DIAGNOSIS — E291 Testicular hypofunction: Secondary | ICD-10-CM

## 2013-11-30 DIAGNOSIS — E039 Hypothyroidism, unspecified: Secondary | ICD-10-CM

## 2013-11-30 MED ORDER — LEVOTHYROXINE SODIUM 50 MCG PO TABS: 50.0000 ug | ORAL_TABLET | Freq: Every day | ORAL | Status: DC

## 2013-11-30 NOTE — Patient Instructions (Addendum)
i have sent a prescription to your pharmacy, for the thyroid.   Most people continue to need this for the rest of their lives. Please have the blood test rechecked in 1 month.  I would be happy to see you back here whenever you want.

## 2013-11-30 NOTE — Progress Notes (Signed)
   Subjective:    Patient ID: Paul Parrish, male    DOB: 02-25-1971, 43 y.o.   MRN: [id number]  HPI Pt was noted in February of 2015 to have a slightly elevated TSH.  He has arthralgias of all 4 extremities, and assoc fatigue. Past Medical History  Diagnosis Date  . Depression   . Anxiety   . Elevated hemoglobin   . Low testosterone     No past surgical history on file.  History   Social History  . Marital Status: Married    Spouse Name: N/A    Number of Children: N/A  . Years of Education: N/A   Occupational History  . Not on file.   Social History Main Topics  . Smoking status: Never Smoker   . Smokeless tobacco: Never Used  . Alcohol Use: No  . Drug Use: No  . Sexual Activity: Not on file   Other Topics Concern  . Not on file   Social History Narrative  . No narrative on file    Current Outpatient Prescriptions on File Prior to Visit  Medication Sig Dispense Refill  . aspirin 81 MG tablet Take 81 mg by mouth daily.      Marland Kitchen testosterone cypionate (DEPOTESTOTERONE CYPIONATE) 200 MG/ML injection INJECT ONE ML INTRAMUSCULARLY EVERY TWO WEEKS  10 mL  0  . venlafaxine XR (EFFEXOR-XR) 150 MG 24 hr capsule Take 150 mg by mouth daily with breakfast.       No current facility-administered medications on file prior to visit.   Allergies  Allergen Reactions  . Amoxicillin   . Penicillins    Family History  Problem Relation Age of Onset  . Cancer Mother   . Hypertension Father   . Diabetes Father   . Healthy Brother   brother has hypothyroidism.  BP 124/82  Pulse 78  Temp(Src) 97.6 F (36.4 C) (Oral)  Ht 6\' 3"  (1.905 m)  Wt 265 lb (120.203 kg)  BMI 33.12 kg/m2  SpO2 97%  Review of Systems denies hair loss, cramps, sob, weight gain, constipation, blurry vision, rhinorrhea, easy bruising, and syncope.  He has depression, difficulty with concentration, acral numbness, and excessive diaphoresis.    Objective:   Physical Exam VS: see vs page GEN: no  distress HEAD: head: no deformity eyes: no periorbital swelling, no proptosis external nose and ears are normal mouth: no lesion seen NECK: supple, thyroid is not enlarged CHEST WALL: no deformity LUNGS: clear to auscultation BREASTS:  No gynecomastia CV: reg rate and rhythm, no murmur ABD: abdomen is soft, nontender.  no hepatosplenomegaly.  not distended.  no hernia MUSCULOSKELETAL: muscle bulk and strength are grossly normal.  no obvious joint swelling.  gait is normal and steady EXTEMITIES: no deformity.   no edema PULSES: no carotid bruit NEURO:  cn 2-12 grossly intact.   readily moves all 4's.  sensation is intact to touch on all 4's. SKIN:  Normal texture and temperature.  No rash or suspicious lesion is visible.   NODES:  None palpable at the neck PSYCH: alert, well-oriented.  Does not appear anxious nor depressed.  Lab Results  Component Value Date   TSH 6.290* 11/09/2013   T4TOTAL 9.1 01/07/2013      Assessment & Plan:  Hypothyroidism, new, mild Depression: unlikely thyroid-related Arthralgias: not thyroid-related

## 2013-12-01 DIAGNOSIS — E291 Testicular hypofunction: Secondary | ICD-10-CM | POA: Insufficient documentation

## 2013-12-27 ENCOUNTER — Encounter: Payer: Self-pay | Admitting: *Deleted

## 2013-12-31 ENCOUNTER — Ambulatory Visit: Payer: Medicaid Other | Admitting: Endocrinology

## 2014-01-05 ENCOUNTER — Other Ambulatory Visit (INDEPENDENT_AMBULATORY_CARE_PROVIDER_SITE_OTHER): Payer: Medicaid Other

## 2014-01-05 DIAGNOSIS — R7989 Other specified abnormal findings of blood chemistry: Secondary | ICD-10-CM

## 2014-01-05 DIAGNOSIS — R946 Abnormal results of thyroid function studies: Secondary | ICD-10-CM

## 2014-01-05 NOTE — Addendum Note (Signed)
Addended by: Wyline Mood on: 01/05/2014 08:22 AM   Modules accepted: Orders

## 2014-01-05 NOTE — Progress Notes (Signed)
Patient came in for labs only.

## 2014-01-06 ENCOUNTER — Encounter: Payer: Self-pay | Admitting: Endocrinology

## 2014-01-07 LAB — T3, FREE: T3 FREE: 3.9 pg/mL (ref 2.0–4.4)

## 2014-01-07 LAB — T4+FREE T4

## 2014-01-07 LAB — CK TOTAL AND CKMB (NOT AT ARMC)
CK-MB Index: 4.1 ng/mL (ref 0.0–10.4)
Total CK: 266 U/L — ABNORMAL HIGH (ref 24–204)

## 2014-01-08 LAB — SPECIMEN STATUS REPORT

## 2014-01-14 ENCOUNTER — Ambulatory Visit: Payer: Medicaid Other | Admitting: Endocrinology

## 2014-01-21 ENCOUNTER — Encounter: Payer: Self-pay | Admitting: Endocrinology

## 2014-01-21 ENCOUNTER — Ambulatory Visit (INDEPENDENT_AMBULATORY_CARE_PROVIDER_SITE_OTHER): Payer: Medicaid Other | Admitting: Endocrinology

## 2014-01-21 VITALS — BP 116/82 | HR 73 | Temp 97.9°F | Ht 75.0 in | Wt 254.0 lb

## 2014-01-21 DIAGNOSIS — E039 Hypothyroidism, unspecified: Secondary | ICD-10-CM

## 2014-01-21 DIAGNOSIS — E291 Testicular hypofunction: Secondary | ICD-10-CM

## 2014-01-21 LAB — TSH: TSH: 2.21 u[IU]/mL (ref 0.35–5.50)

## 2014-01-21 NOTE — Patient Instructions (Addendum)
blood tests are being requested for you today.  We'll contact you with results. Pending the results, please continue the same medications. Most people continue to need this for the rest of their lives.

## 2014-01-21 NOTE — Progress Notes (Signed)
   Subjective:    Patient ID: Paul Parrish, male    DOB: [birth year], 43 y.o.   MRN: [id number]  HPI Pt was noted in February of 2015 to have a slightly elevated TSH.  He was rx'ed with a low dosage of synthroid.  Since he has been on it, he still has depression and fatigue Past Medical History  Diagnosis Date  . Depression   . Anxiety   . Elevated hemoglobin   . Low testosterone     No past surgical history on file.  History   Social History  . Marital Status: Married    Spouse Name: N/A    Number of Children: N/A  . Years of Education: N/A   Occupational History  . Not on file.   Social History Main Topics  . Smoking status: Never Smoker   . Smokeless tobacco: Never Used  . Alcohol Use: No  . Drug Use: No  . Sexual Activity: Not on file   Other Topics Concern  . Not on file   Social History Narrative  . No narrative on file    Current Outpatient Prescriptions on File Prior to Visit  Medication Sig Dispense Refill  . aspirin 81 MG tablet Take 81 mg by mouth daily.      . cholecalciferol (VITAMIN D) 1000 UNITS tablet Take 2,000 Units by mouth daily.      . cyanocobalamin 100 MCG tablet Take 100 mcg by mouth daily.      Marland Kitchen levothyroxine (SYNTHROID, LEVOTHROID) 50 MCG tablet Take 1 tablet (50 mcg total) by mouth daily before breakfast.  30 tablet  11  . testosterone cypionate (DEPOTESTOTERONE CYPIONATE) 200 MG/ML injection INJECT ONE ML INTRAMUSCULARLY EVERY TWO WEEKS  10 mL  0  . venlafaxine XR (EFFEXOR-XR) 150 MG 24 hr capsule Take 150 mg by mouth daily with breakfast.       No current facility-administered medications on file prior to visit.    Allergies  Allergen Reactions  . Amoxicillin   . Penicillins     Family History  Problem Relation Age of Onset  . Cancer Mother   . Hypertension Father   . Diabetes Father   . Healthy Brother     BP 116/82  Pulse 73  Temp(Src) 97.9 F (36.6 C) (Oral)  Ht 6\' 3"  (1.905 m)  Wt 254 lb (115.214 kg)  BMI 31.75  kg/m2  SpO2 97%  Review of Systems He has lost a few lbs.    Objective:   Physical Exam VITAL SIGNS:  See vs page GENERAL: no distress NECK: There is no palpable thyroid enlargement.  No thyroid nodule is palpable.  No palpable lymphadenopathy at the anterior neck.  Lab Results  Component Value Date   TSH 2.21 01/21/2014   T4TOTAL 9.1 01/07/2013      Assessment & Plan:  Chronic hypothyroidism: well-replaced Depression and other sxs, not thyroid-related

## 2014-01-23 ENCOUNTER — Other Ambulatory Visit: Payer: Self-pay | Admitting: Family Medicine

## 2014-01-25 ENCOUNTER — Other Ambulatory Visit: Payer: Self-pay | Admitting: Family Medicine

## 2014-01-26 ENCOUNTER — Telehealth: Payer: Self-pay | Admitting: Family Medicine

## 2014-01-26 NOTE — Telephone Encounter (Signed)
appt given for Monday with mmm

## 2014-01-31 ENCOUNTER — Ambulatory Visit (INDEPENDENT_AMBULATORY_CARE_PROVIDER_SITE_OTHER): Payer: Medicaid Other | Admitting: Family Medicine

## 2014-01-31 VITALS — BP 133/85 | HR 80 | Temp 97.0°F | Ht 75.0 in | Wt 252.0 lb

## 2014-01-31 DIAGNOSIS — F32A Depression, unspecified: Secondary | ICD-10-CM

## 2014-01-31 DIAGNOSIS — F329 Major depressive disorder, single episode, unspecified: Secondary | ICD-10-CM

## 2014-01-31 DIAGNOSIS — E291 Testicular hypofunction: Secondary | ICD-10-CM

## 2014-01-31 DIAGNOSIS — F3289 Other specified depressive episodes: Secondary | ICD-10-CM

## 2014-01-31 LAB — POCT CBC
Granulocyte percent: 52.2 %G (ref 37–80)
HCT, POC: 55.2 % — AB (ref 43.5–53.7)
Hemoglobin: 18.1 g/dL (ref 14.1–18.1)
Lymph, poc: 3.3 (ref 0.6–3.4)
MCH, POC: 29.7 pg (ref 27–31.2)
MCHC: 32.8 g/dL (ref 31.8–35.4)
MCV: 90.4 fL (ref 80–97)
MPV: 7.3 fL (ref 0–99.8)
POC Granulocyte: 4.1 (ref 2–6.9)
POC LYMPH PERCENT: 42.2 %L (ref 10–50)
Platelet Count, POC: 298 10*3/uL (ref 142–424)
RBC: 6.1 M/uL (ref 4.69–6.13)
RDW, POC: 12.3 %
WBC: 7.9 10*3/uL (ref 4.6–10.2)

## 2014-01-31 MED ORDER — TESTOSTERONE CYPIONATE 200 MG/ML IM SOLN: 200.0000 mg | INTRAMUSCULAR | Status: DC

## 2014-01-31 MED ORDER — VENLAFAXINE HCL ER 150 MG PO CP24: 150.0000 mg | ORAL_CAPSULE | Freq: Every day | ORAL | Status: DC

## 2014-01-31 NOTE — Progress Notes (Signed)
   Subjective:    Patient ID: Revel Stellmach, male    DOB: 1971/07/28, 43 y.o.   MRN: [id number]  HPI  This 43 y.o. male presents for evaluation of hypogonadism.  He needs refills on his testosterone that he is taking biweekly.  He has not been seen since 11/4.  Review of Systems No chest pain, SOB, HA, dizziness, vision change, N/V, diarrhea, constipation, dysuria, urinary urgency or frequency, myalgias, arthralgias or rash.     Objective:   Physical Exam Vital signs noted  Well developed well nourished male.  HEENT - Head atraumatic Normocephalic                Eyes - PERRLA, Conjuctiva - clear Sclera- Clear EOMI                Ears - EAC's Wnl TM's Wnl Gross Hearing WNL                Throat - oropharanx wnl Respiratory - Lungs CTA bilateral Cardiac - RRR S1 and S2 without murmur GI - Abdomen soft Nontender and bowel sounds active x 4 Extremities - No edema. Neuro - Grossly intact.       Assessment & Plan:  Hypogonadism male - Plan: testosterone cypionate (DEPOTESTOTERONE CYPIONATE) 200 MG/ML injection, PSA, total and free, POCT CBC, Testosterone,Free and Total, venlafaxine XR (EFFEXOR-XR) 150 MG 24 hr capsule, CMP14+EGFR  Depression - Plan: testosterone cypionate (DEPOTESTOTERONE CYPIONATE) 200 MG/ML injection, PSA, total and free, POCT CBC, Testosterone,Free and Total, venlafaxine XR (EFFEXOR-XR) 150 MG 24 hr capsule, CMP14+EGFR  Lysbeth Penner FNP

## 2014-02-01 LAB — CMP14+EGFR
ALT: 24 IU/L (ref 0–44)
AST: 24 IU/L (ref 0–40)
Albumin/Globulin Ratio: 1.7 (ref 1.1–2.5)
Albumin: 4.5 g/dL (ref 3.5–5.5)
Alkaline Phosphatase: 74 IU/L (ref 39–117)
BUN/Creatinine Ratio: 12 (ref 9–20)
BUN: 14 mg/dL (ref 6–24)
CO2: 23 mmol/L (ref 18–29)
Calcium: 9.4 mg/dL (ref 8.7–10.2)
Chloride: 100 mmol/L (ref 97–108)
Creatinine, Ser: 1.21 mg/dL (ref 0.76–1.27)
GFR calc Af Amer: 85 mL/min/{1.73_m2} (ref >59)
GFR calc non Af Amer: 73 mL/min/{1.73_m2} (ref >59)
Globulin, Total: 2.6 g/dL (ref 1.5–4.5)
Glucose: 95 mg/dL (ref 65–99)
Potassium: 3.8 mmol/L (ref 3.5–5.2)
Sodium: 140 mmol/L (ref 134–144)
Total Bilirubin: 0.3 mg/dL (ref 0.0–1.2)
Total Protein: 7.1 g/dL (ref 6.0–8.5)

## 2014-02-01 LAB — TESTOSTERONE,FREE AND TOTAL
Testosterone, Free: 5.8 pg/mL — ABNORMAL LOW (ref 6.8–21.5)
Testosterone: 170 ng/dL — ABNORMAL LOW (ref 348–1197)

## 2014-02-01 LAB — PSA, TOTAL AND FREE
PSA, Free Pct: 7.3 %
PSA, Free: 0.08 ng/mL
PSA: 1.1 ng/mL (ref 0.0–4.0)

## 2014-02-25 ENCOUNTER — Telehealth: Payer: Self-pay | Admitting: Family Medicine

## 2014-02-25 NOTE — Telephone Encounter (Signed)
Patient aware.

## 2014-02-26 ENCOUNTER — Other Ambulatory Visit: Payer: Self-pay | Admitting: Family Medicine

## 2014-04-11 ENCOUNTER — Other Ambulatory Visit: Payer: Self-pay | Admitting: Family Medicine

## 2014-05-16 ENCOUNTER — Telehealth: Payer: Self-pay | Admitting: Family Medicine

## 2014-05-16 DIAGNOSIS — R5383 Other fatigue: Secondary | ICD-10-CM

## 2014-05-16 DIAGNOSIS — E349 Endocrine disorder, unspecified: Secondary | ICD-10-CM

## 2014-05-16 DIAGNOSIS — R5381 Other malaise: Secondary | ICD-10-CM

## 2014-05-16 DIAGNOSIS — Z139 Encounter for screening, unspecified: Secondary | ICD-10-CM

## 2014-05-16 NOTE — Telephone Encounter (Signed)
Paul Parrish will you please review

## 2014-05-18 ENCOUNTER — Other Ambulatory Visit (INDEPENDENT_AMBULATORY_CARE_PROVIDER_SITE_OTHER): Payer: Medicaid Other

## 2014-05-18 DIAGNOSIS — Z139 Encounter for screening, unspecified: Secondary | ICD-10-CM

## 2014-05-18 DIAGNOSIS — R5381 Other malaise: Secondary | ICD-10-CM

## 2014-05-18 DIAGNOSIS — E291 Testicular hypofunction: Secondary | ICD-10-CM

## 2014-05-18 DIAGNOSIS — R5383 Other fatigue: Secondary | ICD-10-CM

## 2014-05-18 LAB — POCT CBC
Granulocyte percent: 51.2 %G (ref 37–80)
HCT, POC: 54.9 % — AB (ref 43.5–53.7)
Hemoglobin: 19.2 g/dL — AB (ref 14.1–18.1)
Lymph, poc: 3.4 (ref 0.6–3.4)
MCH, POC: 31 pg (ref 27–31.2)
MCHC: 35 g/dL (ref 31.8–35.4)
MCV: 88.4 fL (ref 80–97)
MPV: 7.4 fL (ref 0–99.8)
POC Granulocyte: 4 (ref 2–6.9)
POC LYMPH PERCENT: 44.1 %L (ref 10–50)
Platelet Count, POC: 292 10*3/uL (ref 142–424)
RBC: 6.2 M/uL — AB (ref 4.69–6.13)
RDW, POC: 13.4 %
WBC: 7.8 10*3/uL (ref 4.6–10.2)

## 2014-05-18 NOTE — Progress Notes (Signed)
Pt came in for lab  only 

## 2014-05-21 LAB — CMP14+EGFR
ALT: 25 IU/L (ref 0–44)
AST: 24 IU/L (ref 0–40)
Albumin/Globulin Ratio: 1.6 (ref 1.1–2.5)
Albumin: 4.4 g/dL (ref 3.5–5.5)
Alkaline Phosphatase: 69 IU/L (ref 39–117)
BUN/Creatinine Ratio: 8 — ABNORMAL LOW (ref 9–20)
BUN: 10 mg/dL (ref 6–24)
CO2: 21 mmol/L (ref 18–29)
Calcium: 9.5 mg/dL (ref 8.7–10.2)
Chloride: 95 mmol/L — ABNORMAL LOW (ref 97–108)
Creatinine, Ser: 1.22 mg/dL (ref 0.76–1.27)
GFR calc Af Amer: 83 mL/min/{1.73_m2} (ref >59)
GFR calc non Af Amer: 72 mL/min/{1.73_m2} (ref >59)
Globulin, Total: 2.7 g/dL (ref 1.5–4.5)
Glucose: 88 mg/dL (ref 65–99)
Potassium: 4.4 mmol/L (ref 3.5–5.2)
Sodium: 140 mmol/L (ref 134–144)
Total Bilirubin: 0.5 mg/dL (ref 0.0–1.2)
Total Protein: 7.1 g/dL (ref 6.0–8.5)

## 2014-05-21 LAB — LIPID PANEL
Chol/HDL Ratio: 7.3 ratio units — ABNORMAL HIGH (ref 0.0–5.0)
Cholesterol, Total: 212 mg/dL — ABNORMAL HIGH (ref 100–199)
HDL: 29 mg/dL — ABNORMAL LOW (ref >39)
LDL Calculated: 111 mg/dL — ABNORMAL HIGH (ref 0–99)
Triglycerides: 361 mg/dL — ABNORMAL HIGH (ref 0–149)
VLDL Cholesterol Cal: 72 mg/dL — ABNORMAL HIGH (ref 5–40)

## 2014-05-21 LAB — TESTOSTERONE,FREE AND TOTAL
Testosterone, Free: 8.4 pg/mL (ref 6.8–21.5)
Testosterone: 348 ng/dL (ref 348–1197)

## 2014-05-24 ENCOUNTER — Telehealth: Payer: Self-pay | Admitting: *Deleted

## 2014-05-27 ENCOUNTER — Ambulatory Visit (INDEPENDENT_AMBULATORY_CARE_PROVIDER_SITE_OTHER): Payer: Medicaid Other | Admitting: Family Medicine

## 2014-05-27 ENCOUNTER — Encounter: Payer: Self-pay | Admitting: Family Medicine

## 2014-05-27 VITALS — BP 123/88 | HR 94 | Temp 99.0°F | Ht 75.0 in | Wt 251.0 lb

## 2014-05-27 DIAGNOSIS — F329 Major depressive disorder, single episode, unspecified: Secondary | ICD-10-CM

## 2014-05-27 DIAGNOSIS — F3289 Other specified depressive episodes: Secondary | ICD-10-CM

## 2014-05-27 DIAGNOSIS — F32A Depression, unspecified: Secondary | ICD-10-CM

## 2014-05-27 DIAGNOSIS — E291 Testicular hypofunction: Secondary | ICD-10-CM

## 2014-05-27 DIAGNOSIS — D751 Secondary polycythemia: Secondary | ICD-10-CM

## 2014-05-27 LAB — POCT CBC
Granulocyte percent: 67.1 %G (ref 37–80)
HCT, POC: 56.6 % — AB (ref 43.5–53.7)
Hemoglobin: 19.8 g/dL — AB (ref 14.1–18.1)
Lymph, poc: 2.2 (ref 0.6–3.4)
MCH, POC: 30.8 pg (ref 27–31.2)
MCHC: 34.9 g/dL (ref 31.8–35.4)
MCV: 88.1 fL (ref 80–97)
MPV: 7 fL (ref 0–99.8)
POC Granulocyte: 5 (ref 2–6.9)
POC LYMPH PERCENT: 29.6 %L (ref 10–50)
Platelet Count, POC: 272 10*3/uL (ref 142–424)
RBC: 6.4 M/uL — AB (ref 4.69–6.13)
RDW, POC: 13.3 %
WBC: 7.5 10*3/uL (ref 4.6–10.2)

## 2014-05-27 MED ORDER — TESTOSTERONE CYPIONATE 200 MG/ML IM SOLN: 200.0000 mg | INTRAMUSCULAR | Status: DC

## 2014-05-27 NOTE — Progress Notes (Signed)
   Subjective:    Patient ID: Paul Parrish, male    DOB: 1971-03-05, 43 y.o.   MRN: [id number]  HPI  Patient is here for follow up.  He needs refills on testosterone.  He has elevated hgb and hct. He has had to have therapeutic blood draws in the past.  He did have a LAP which is negative. He has secondary polycythemia due to testosterone replacement.  His testosterone level is normal and he feels better.  Review of Systems No chest pain, SOB, HA, dizziness, vision change, N/V, diarrhea, constipation, dysuria, urinary urgency or frequency, myalgias, arthralgias or rash.     Objective:   Physical Exam Vital signs noted  Well developed well nourished male.  HEENT - Head atraumatic Normocephalic                Eyes - PERRLA, Conjuctiva - clear Sclera- Clear EOMI                Ears - EAC's Wnl TM's Wnl Gross Hearing WNL                Throat - oropharanx wnl Respiratory - Lungs CTA bilateral Cardiac - RRR S1 and S2 without murmur GI - Abdomen soft Nontender and bowel sounds active x 4       Assessment & Plan:  Hypogonadism male - Plan: testosterone cypionate (DEPOTESTOTERONE CYPIONATE) 200 MG/ML injection, POCT CBC  Depression - Plan: testosterone cypionate (DEPOTESTOTERONE CYPIONATE) 200 MG/ML injection, POCT CBC  Polycythemia, secondary - Plan: testosterone cypionate (DEPOTESTOTERONE CYPIONATE) 200 MG/ML injection, POCT CBC, Ambulatory referral to Hematology  Lysbeth Penner FNP

## 2014-05-31 ENCOUNTER — Encounter (HOSPITAL_COMMUNITY): Payer: Self-pay | Admitting: Lab

## 2014-05-31 ENCOUNTER — Ambulatory Visit (HOSPITAL_COMMUNITY): Payer: Medicaid Other

## 2014-06-02 ENCOUNTER — Encounter: Payer: Self-pay | Admitting: Family Medicine

## 2014-06-07 ENCOUNTER — Encounter: Payer: Self-pay | Admitting: Family Medicine

## 2014-06-07 ENCOUNTER — Other Ambulatory Visit: Payer: Self-pay | Admitting: Family Medicine

## 2014-06-08 ENCOUNTER — Encounter: Payer: Self-pay | Admitting: Endocrinology

## 2014-06-09 ENCOUNTER — Encounter (HOSPITAL_COMMUNITY): Payer: Self-pay | Admitting: Lab

## 2014-06-09 NOTE — Progress Notes (Unsigned)
I have called patient numerous times and left message for new appointment.  Patient has not called back to confirm appointment.

## 2014-06-10 ENCOUNTER — Other Ambulatory Visit (INDEPENDENT_AMBULATORY_CARE_PROVIDER_SITE_OTHER): Payer: Medicaid Other

## 2014-06-10 ENCOUNTER — Other Ambulatory Visit: Payer: Self-pay

## 2014-06-10 DIAGNOSIS — E039 Hypothyroidism, unspecified: Secondary | ICD-10-CM

## 2014-06-10 LAB — TSH: TSH: 0.3 u[IU]/mL — ABNORMAL LOW (ref 0.35–4.50)

## 2014-06-13 ENCOUNTER — Encounter (HOSPITAL_COMMUNITY): Payer: Medicaid Other | Attending: Hematology and Oncology

## 2014-06-13 ENCOUNTER — Encounter (HOSPITAL_COMMUNITY): Payer: Self-pay

## 2014-06-13 VITALS — BP 141/79 | HR 97 | Temp 98.3°F | Resp 18 | Wt 252.0 lb

## 2014-06-13 DIAGNOSIS — F918 Other conduct disorders: Secondary | ICD-10-CM | POA: Insufficient documentation

## 2014-06-13 DIAGNOSIS — D751 Secondary polycythemia: Secondary | ICD-10-CM

## 2014-06-13 DIAGNOSIS — E038 Other specified hypothyroidism: Secondary | ICD-10-CM

## 2014-06-13 DIAGNOSIS — E039 Hypothyroidism, unspecified: Secondary | ICD-10-CM | POA: Insufficient documentation

## 2014-06-13 DIAGNOSIS — T387X5A Adverse effect of androgens and anabolic congeners, initial encounter: Secondary | ICD-10-CM | POA: Diagnosis not present

## 2014-06-13 DIAGNOSIS — D45 Polycythemia vera: Secondary | ICD-10-CM | POA: Diagnosis present

## 2014-06-13 DIAGNOSIS — E291 Testicular hypofunction: Secondary | ICD-10-CM

## 2014-06-13 DIAGNOSIS — F69 Unspecified disorder of adult personality and behavior: Secondary | ICD-10-CM

## 2014-06-13 DIAGNOSIS — F919 Conduct disorder, unspecified: Secondary | ICD-10-CM

## 2014-06-13 LAB — CBC WITH DIFFERENTIAL/PLATELET
BASOS PCT: 0 % (ref 0–1)
Basophils Absolute: 0 10*3/uL (ref 0.0–0.1)
EOS ABS: 0.1 10*3/uL (ref 0.0–0.7)
Eosinophils Relative: 1 % (ref 0–5)
HEMATOCRIT: 56.9 % — AB (ref 39.0–52.0)
Hemoglobin: 20.4 g/dL — ABNORMAL HIGH (ref 13.0–17.0)
Lymphocytes Relative: 34 % (ref 12–46)
Lymphs Abs: 2 10*3/uL (ref 0.7–4.0)
MCH: 31.1 pg (ref 26.0–34.0)
MCHC: 35.9 g/dL (ref 30.0–36.0)
MCV: 86.7 fL (ref 78.0–100.0)
MONO ABS: 0.6 10*3/uL (ref 0.1–1.0)
MONOS PCT: 10 % (ref 3–12)
NEUTROS ABS: 3.2 10*3/uL (ref 1.7–7.7)
Neutrophils Relative %: 54 % (ref 43–77)
Platelets: 245 10*3/uL (ref 150–400)
RBC: 6.56 MIL/uL — AB (ref 4.22–5.81)
RDW: 12.9 % (ref 11.5–15.5)
WBC: 5.9 10*3/uL (ref 4.0–10.5)

## 2014-06-13 LAB — COMPREHENSIVE METABOLIC PANEL
ALT: 29 U/L (ref 0–53)
AST: 30 U/L (ref 0–37)
Albumin: 4.2 g/dL (ref 3.5–5.2)
Alkaline Phosphatase: 75 U/L (ref 39–117)
Anion gap: 12 (ref 5–15)
BILIRUBIN TOTAL: 0.8 mg/dL (ref 0.3–1.2)
BUN: 9 mg/dL (ref 6–23)
CHLORIDE: 101 meq/L (ref 96–112)
CO2: 27 mEq/L (ref 19–32)
CREATININE: 1.15 mg/dL (ref 0.50–1.35)
Calcium: 9.5 mg/dL (ref 8.4–10.5)
GFR calc Af Amer: 89 mL/min — ABNORMAL LOW (ref >90)
GFR, EST NON AFRICAN AMERICAN: 76 mL/min — AB (ref >90)
Glucose, Bld: 79 mg/dL (ref 70–99)
Potassium: 4.4 mEq/L (ref 3.7–5.3)
Sodium: 140 mEq/L (ref 137–147)
Total Protein: 8 g/dL (ref 6.0–8.3)

## 2014-06-13 LAB — RETICULOCYTES
RBC.: 6.56 MIL/uL — AB (ref 4.22–5.81)
RETIC COUNT ABSOLUTE: 59 10*3/uL (ref 19.0–186.0)
Retic Ct Pct: 0.9 % (ref 0.4–3.1)

## 2014-06-13 LAB — LACTATE DEHYDROGENASE: LDH: 250 U/L (ref 94–250)
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
## 2014-06-13 NOTE — Patient Instructions (Addendum)
[facility] Discharge Instructions  RECOMMENDATIONS MADE BY THE CONSULTANT AND ANY TEST RESULTS WILL BE SENT TO YOUR REFERRING PHYSICIAN.  We will see you in 2 weeks for follow up. We will review your lab work at that time.  Please call for any questions or concerns.   Thank you for choosing [facility] to provide your oncology and hematology care.  To afford each patient quality time with our providers, please arrive at least 15 minutes before your scheduled appointment time.  With your help, our goal is to use those 15 minutes to complete the necessary work-up to ensure our physicians have the information they need to help with your evaluation and healthcare recommendations.    Effective January 1st, 2014, we ask that you re-schedule your appointment with our physicians should you arrive 10 or more minutes late for your appointment.  We strive to give you quality time with our providers, and arriving late affects you and other patients whose appointments are after yours.    Again, thank you for choosing [facility].  Our hope is that these requests will decrease the amount of time that you wait before being seen by our physicians.       _____________________________________________________________  Should you have questions after your visit to [facility], please contact our office at [phone] between the hours of 8:30 a.m. and 4:30 p.m.  Voicemails left after 4:30 p.m. will not be returned until the following business day.  For prescription refill requests, have your pharmacy contact our office with your prescription refill request.    _______________________________________________________________  We hope that we have given you very good care.  You may receive a patient satisfaction survey in the mail, please complete it and return it as soon as possible.  We value your  feedback!  _______________________________________________________________  Have you asked about our STAR program?  STAR stands for Survivorship Training and Rehabilitation, and this is a nationally recognized cancer care program that focuses on survivorship and rehabilitation.  Cancer and cancer treatments may cause problems, such as, pain, making you feel tired and keeping you from doing the things that you need or want to do. Cancer rehabilitation can help. Our goal is to reduce these troubling effects and help you have the best quality of life possible.  You may receive a survey from a nurse that asks questions about your current state of health.  Based on the survey results, all eligible patients will be referred to the [facility] program for an evaluation so we can better serve you!  A frequently asked questions sheet is available upon request.

## 2014-06-15 LAB — ERYTHROPOIETIN: Erythropoietin: 5.4 m[IU]/mL (ref 2.6–18.5)

## 2014-06-15 LAB — JAK2 GENOTYPR: JAK2 GenotypR: NOT DETECTED

## 2014-06-16 ENCOUNTER — Other Ambulatory Visit: Payer: Self-pay | Admitting: Family Medicine

## 2014-06-16 LAB — P190 BCR-ABL 1: P190 BCR ABL1: NOT DETECTED

## 2014-06-16 LAB — BCR/ABL GENE REARRANGEMENT QNT, PCR
BCR ABL1 / ABL1 IS: 0 %
BCR ABL1 / ABL1: 0 %

## 2014-06-16 LAB — P210 BCR-ABL 1: P210 BCR ABL1: NOT DETECTED

## 2014-06-25 DIAGNOSIS — D751 Secondary polycythemia: Secondary | ICD-10-CM | POA: Insufficient documentation

## 2014-06-27 ENCOUNTER — Encounter (HOSPITAL_BASED_OUTPATIENT_CLINIC_OR_DEPARTMENT_OTHER): Payer: Medicaid Other

## 2014-06-27 ENCOUNTER — Encounter (HOSPITAL_COMMUNITY): Payer: Medicaid Other

## 2014-06-27 ENCOUNTER — Encounter (HOSPITAL_COMMUNITY): Payer: Self-pay

## 2014-06-27 VITALS — BP 137/88 | HR 85 | Temp 98.0°F | Resp 18 | Wt 255.2 lb

## 2014-06-27 DIAGNOSIS — Z23 Encounter for immunization: Secondary | ICD-10-CM

## 2014-06-27 DIAGNOSIS — F69 Unspecified disorder of adult personality and behavior: Secondary | ICD-10-CM

## 2014-06-27 DIAGNOSIS — F919 Conduct disorder, unspecified: Secondary | ICD-10-CM

## 2014-06-27 DIAGNOSIS — D751 Secondary polycythemia: Secondary | ICD-10-CM

## 2014-06-27 DIAGNOSIS — E291 Testicular hypofunction: Secondary | ICD-10-CM

## 2014-06-27 DIAGNOSIS — E038 Other specified hypothyroidism: Secondary | ICD-10-CM

## 2014-06-27 MED ORDER — INFLUENZA VAC SPLIT QUAD 0.5 ML IM SUSY
PREFILLED_SYRINGE | INTRAMUSCULAR | Status: AC
  Filled 2014-06-27: qty 0.5

## 2014-06-27 MED ORDER — INFLUENZA VAC SPLIT QUAD 0.5 ML IM SUSY
0.5000 mL | PREFILLED_SYRINGE | Freq: Once | INTRAMUSCULAR | Status: AC
  Administered 2014-06-27: 0.5 mL via INTRAMUSCULAR

## 2014-06-27 NOTE — Patient Instructions (Signed)
[facility] Discharge Instructions  RECOMMENDATIONS MADE BY THE CONSULTANT AND ANY TEST RESULTS WILL BE SENT TO YOUR REFERRING PHYSICIAN.  Three options open for management:  A. Stop testosterone.  B. Periodic phlebotomy to maintain hemoglobin around 16 while continuing testosterone.  C. Use of Hydrea with the danger of producing leukopenia and thrombocytopenia while continuing testosterone. I It will be left to the patient and his family physician as well as endocrinologist at the site which of the above to implement. No further appointments are necessary in this office.   Thank you for choosing [facility] to provide your oncology and hematology care.  To afford each patient quality time with our providers, please arrive at least 15 minutes before your scheduled appointment time.  With your help, our goal is to use those 15 minutes to complete the necessary work-up to ensure our physicians have the information they need to help with your evaluation and healthcare recommendations.    Effective January 1st, 2014, we ask that you re-schedule your appointment with our physicians should you arrive 10 or more minutes late for your appointment.  We strive to give you quality time with our providers, and arriving late affects you and other patients whose appointments are after yours.    Again, thank you for choosing [facility].  Our hope is that these requests will decrease the amount of time that you wait before being seen by our physicians.       _____________________________________________________________  Should you have questions after your visit to [facility], please contact our office at [phone] between the hours of 8:30 a.m. and 4:30 p.m.  Voicemails left after 4:30 p.m. will not be returned until the following business day.  For prescription refill requests, have your pharmacy contact our office with your prescription refill  request.    _______________________________________________________________  We hope that we have given you very good care.  You may receive a patient satisfaction survey in the mail, please complete it and return it as soon as possible.  We value your feedback!  _______________________________________________________________  Have you asked about our STAR program?  STAR stands for Survivorship Training and Rehabilitation, and this is a nationally recognized cancer care program that focuses on survivorship and rehabilitation.  Cancer and cancer treatments may cause problems, such as, pain, making you feel tired and keeping you from doing the things that you need or want to do. Cancer rehabilitation can help. Our goal is to reduce these troubling effects and help you have the best quality of life possible.  You may receive a survey from a nurse that asks questions about your current state of health.  Based on the survey results, all eligible patients will be referred to the [facility] program for an evaluation so we can better serve you!  A frequently asked questions sheet is available upon request.

## 2014-06-27 NOTE — Progress Notes (Signed)
Flu shot given to right deltoid. Pt tolerated well.  Labs cancelled per Dr.Formanek.

## 2014-06-27 NOTE — Progress Notes (Signed)
[facility]  OFFICE PROGRESS NOTE  [address], [place] [address]  DIAGNOSIS: Polycythemia, secondary - Plan: CBC with Differential  Polycythemia - Plan: CBC with Differential  Hypogonadism in male - Plan: Testosterone, PSA, CBC with Differential, Testosterone, PSA  Other specified hypothyroidism - Plan: CBC with Differential  Rage attacks, probably due to testosterone therapy - Plan: CBC with Differential  Chief Complaint  Patient presents with  . Polycythemia    CURRENT THERAPY: Depo testosterone every 2 weeks intramuscularly.   INTERVAL HISTORY: Paul Parrish 43 y.o. male returns for followup after additional workup for polycythemia.  He continues on not Depo testosterone every 2 weeks. Appetite is good no new complaints.  MEDICAL HISTORY: Past Medical History  Diagnosis Date  . Depression   . Anxiety   . Elevated hemoglobin   . Low testosterone     INTERIM HISTORY: has Depression; Unspecified hypothyroidism; Hypogonadism male; and Polycythemia, secondary on his problem list.   1. Disseminated herpes zoster with keratoconjunctivitis, on viroptic soln 1 gtt in each eye every 2 hours while awake, acyclovir 5 times per day, and VZ immune globulin 625 IM on 06/01/2014  2. Tinea corporis, improved.  3. Chronic leukocytic leukemia, currently off treatment due to #1   ALLERGIES:  is allergic to amoxicillin and penicillins.  MEDICATIONS: has a current medication list which includes the following prescription(s): aspirin, levothyroxine, testosterone cypionate, and venlafaxine xr.  SURGICAL HISTORY: History reviewed. No pertinent past surgical history.  FAMILY HISTORY: family history includes Cancer in his mother and paternal grandmother; Diabetes in his brother and father; Hypertension in his father.  SOCIAL HISTORY:  reports that he has never smoked. He has never used smokeless tobacco. He reports that he  drinks alcohol. He reports that he does not use illicit drugs.  REVIEW OF SYSTEMS:  Other than that discussed above is noncontributory.  PHYSICAL EXAMINATION: ECOG PERFORMANCE STATUS: 0 - Asymptomatic  Blood pressure 137/88, pulse 85, temperature 98 F (36.7 C), temperature source Oral, resp. rate 18, weight 255 lb 3.2 oz (115.758 kg), SpO2 100.00%.  GENERAL:alert, no distress and comfortable SKIN: skin color, texture, turgor are normal, no rashes or significant lesions EYES: PERLA; Conjunctiva are pink and non-injected, sclera clear SINUSES: No redness or tenderness over maxillary or ethmoid sinuses OROPHARYNX:no exudate, no erythema on lips, buccal mucosa, or tongue. NECK: supple, thyroid normal size, non-tender, without nodularity. No masses CHEST: Normal AP diameter with no gynecomastia. LYMPH:  no palpable lymphadenopathy in the cervical, axillary or inguinal LUNGS: clear to auscultation and percussion with normal breathing effort HEART: regular rate & rhythm and no murmurs. ABDOMEN:abdomen soft, non-tender and normal bowel sounds. Spleen not palpable. MUSCULOSKELETAL:no cyanosis of digits and no clubbing. Range of motion normal.  NEURO: alert & oriented x 3 with fluent speech, no focal motor/sensory deficits   LABORATORY DATA: Office Visit on 06/13/2014  Component Date Value Ref Range Status  . WBC 06/13/2014 5.9  4.0 - 10.5 K/uL Final  . RBC 06/13/2014 6.56* 4.22 - 5.81 MIL/uL Final  . Hemoglobin 06/13/2014 20.4* 13.0 - 17.0 g/dL Final  . HCT 06/13/2014 56.9* 39.0 - 52.0 % Final  . MCV 06/13/2014 86.7  78.0 - 100.0 fL Final  . MCH 06/13/2014 31.1  26.0 - 34.0 pg Final  . MCHC 06/13/2014 35.9  30.0 - 36.0 g/dL Final  . RDW 06/13/2014 12.9  11.5 - 15.5 % Final  . Platelets 06/13/2014  245  150 - 400 K/uL Final  . Neutrophils Relative % 06/13/2014 54  43 - 77 % Final  . Neutro Abs 06/13/2014 3.2  1.7 - 7.7 K/uL Final  . Lymphocytes Relative 06/13/2014 34  12 - 46 % Final  .  Lymphs Abs 06/13/2014 2.0  0.7 - 4.0 K/uL Final  . Monocytes Relative 06/13/2014 10  3 - 12 % Final  . Monocytes Absolute 06/13/2014 0.6  0.1 - 1.0 K/uL Final  . Eosinophils Relative 06/13/2014 1  0 - 5 % Final  . Eosinophils Absolute 06/13/2014 0.1  0.0 - 0.7 K/uL Final  . Basophils Relative 06/13/2014 0  0 - 1 % Final  . Basophils Absolute 06/13/2014 0.0  0.0 - 0.1 K/uL Final  . Retic Ct Pct 06/13/2014 0.9  0.4 - 3.1 % Final  . RBC. 06/13/2014 6.56* 4.22 - 5.81 MIL/uL Final  . Retic Count, Manual 06/13/2014 59.0  19.0 - 186.0 K/uL Final  . Sodium 06/13/2014 140  137 - 147 mEq/L Final  . Potassium 06/13/2014 4.4  3.7 - 5.3 mEq/L Final  . Chloride 06/13/2014 101  96 - 112 mEq/L Final  . CO2 06/13/2014 27  19 - 32 mEq/L Final  . Glucose, Bld 06/13/2014 79  70 - 99 mg/dL Final  . BUN 06/13/2014 9  6 - 23 mg/dL Final  . Creatinine, Ser 06/13/2014 1.15  0.50 - 1.35 mg/dL Final  . Calcium 06/13/2014 9.5  8.4 - 10.5 mg/dL Final  . Total Protein 06/13/2014 8.0  6.0 - 8.3 g/dL Final  . Albumin 06/13/2014 4.2  3.5 - 5.2 g/dL Final  . AST 06/13/2014 30  0 - 37 U/L Final  . ALT 06/13/2014 29  0 - 53 U/L Final  . Alkaline Phosphatase 06/13/2014 75  39 - 117 U/L Final  . Total Bilirubin 06/13/2014 0.8  0.3 - 1.2 mg/dL Final  . GFR calc non Af Amer 06/13/2014 76* >90 mL/min Final  . GFR calc Af Amer 06/13/2014 89* >90 mL/min Final   Comment: (NOTE)                          The eGFR has been calculated using the CKD EPI equation.                          This calculation has not been validated in all clinical situations.                          eGFR's persistently <90 mL/min signify possible Chronic Kidney                          Disease.  . Anion gap 06/13/2014 12  5 - 15 Final  . LDH 06/13/2014 250  94 - 250 U/L Final   SLIGHT HEMOLYSIS  . BCR ABL1 / ABL1 06/13/2014 0.000   Final  . BCR ABL1 / ABL1 IS 06/13/2014 0.000   Final  . Interpretation - BCRQ 06/13/2014 REPORT   Final   Comment:  (NOTE)                          The P190 and P210 BCR-ABL1 fusion transcripts are NOT  detected.                          Reverse transcription real-time PCR is performed for                          the P190 and P210 BCR-ABL1 transcripts associated with                          the t(9;22) chromosomal translocation. For P190,                          results are expressed as a percent ratio of BCR-ABL1                          to the ABL1 transcript, and further adjusted to the                          international scale (IS) for P210.                          Assay sensitivity is dependent on RNA quality and                          sample cellularity but is usually at least 4-logs                          below BCR-ABL1 baseline transcript levels. Reference                          range is 0.000% BCR-ABL1/ABL1.                          This test was developed and its performance                          characteristics have been determined by [employer], [place], [place].                          Performance characteristics refer to the                          analytical performance of the test.                                                                               Neldon Labella, M.D.                          Staff Pathologist  Performed at [facility]  . JAK2 GenotypR 06/13/2014 Not Detected   Final   Comment: (NOTE)                                   ** Normal Reference Range: Not Detected **                          Clinical Utility:                          The somatic acquired mutation affecting Janus Tyrosine Kinase 2 (JAK2                          V617F) in exon 14 is associated with myeloproliferative disorders                          (MPD).  JAK2 V617F has been found to be the most common molecular                          abnormality in patients with  Polycythemia Vera (PV, >90%) or Essential                          Thombocythemia (ET, 35% - 70%).  The lowest frequency is found in IMF                          patients (chronic Idiopathic Myelofibrosis, 50%).  The presence of the                          JAK2 mutation causes activation of molecular signals that lead to                          proliferation of hematopoietic precursors outside of their normal                          pathways including erythropoietin-independent erythroid colony growth                          in most patients with PV and some patients with ET.  The JAK2 mutation                          is considered the main oncogenic event responsible for PV development                          but its precise role in ET and IMF remains questionable and may                          suggest the requirement of other genetic events to induce these                          pathologies.  The absence of JAK2 V617F does not exclude other  changes, including in the exon 12.                          Test Methodology:                          Patient DNA is assayed using allele specific PCR technology from                          Qiagen and is tested using the Roche Light Cycler Real Time PCR                          analyzer. This assay is reported as detected when >5% of cells show                          the presence of the JAK2 V617F mutation.                          This test was developed and its analytical performance characteristics                          have been determined by [facility].  It has not been cleared                          or approved by FDA. This assay has been validated pursuant to the CLIA                          regulations and is used for clinical purposes.                          Performed at [facility]  . Erythropoietin 06/13/2014 5.4  2.6 - 18.5 mIU/mL Final   Performed at [facility]  .  P190 BCR ABL1 06/13/2014 Not Detected   Final   Performed at [facility]  . P210 BCR ABL1 06/13/2014 Not Detected   Final   Performed at [facility] on 06/10/2014  Component Date Value Ref Range Status  . TSH 06/10/2014 0.30* 0.35 - 4.50 uIU/mL Final    PATHOLOGY: JAK-2 negative.  Urinalysis No results found for this basename: colorurine,  appearanceur,  labspec,  phurine,  glucoseu,  hgbur,  bilirubinur,  ketonesur,  proteinur,  urobilinogen,  nitrite,  leukocytesur    RADIOGRAPHIC STUDIES: No results found.  ASSESSMENT:  #1. Secondary polycythemia due to testosterone therapy. #2. Hypogonadism. #3. Episodes of rage associated with testosterone therapy. #4. Hypothyroidism, on treatment, controlled.   PLAN:  #1. Influenza virus I seen was given today. #2. Continue aspirin 81 mg daily. #3. Three options open for management:         A. Stop testosterone.        B. Periodic phlebotomy to maintain hemoglobin around 16 while continuing testosterone.        C. Use of Hydrea with the danger of producing leukopenia and thrombocytopenia while continuing                           testosterone. #4. It will be left to the  patient and his family physician as well as endocrinologist at the site which of the above to implement. No further appointments were made in this office.                   All questions were answered. The patient knows to call the clinic with any problems, questions or concerns. We can certainly see the patient much sooner if necessary.   I spent 25 minutes counseling the patient face to face. The total time spent in the appointment was 30 minutes.    Doroteo Bradford, MD 06/27/2014 3:47 PM  DISCLAIMER:  This note was dictated with voice recognition software.  Similar sounding words can inadvertently be transcribed inaccurately and may not be corrected upon review.

## 2014-06-28 ENCOUNTER — Ambulatory Visit: Payer: Medicaid Other

## 2014-06-30 ENCOUNTER — Ambulatory Visit: Payer: Medicaid Other

## 2014-07-26 ENCOUNTER — Encounter: Payer: Self-pay | Admitting: Family Medicine

## 2014-07-26 ENCOUNTER — Ambulatory Visit (INDEPENDENT_AMBULATORY_CARE_PROVIDER_SITE_OTHER): Payer: BC Managed Care – PPO | Admitting: Family Medicine

## 2014-07-26 VITALS — BP 123/85 | HR 80 | Temp 97.4°F | Ht 75.0 in | Wt 251.0 lb

## 2014-07-26 DIAGNOSIS — E038 Other specified hypothyroidism: Secondary | ICD-10-CM

## 2014-07-26 DIAGNOSIS — D751 Secondary polycythemia: Secondary | ICD-10-CM

## 2014-07-26 DIAGNOSIS — F329 Major depressive disorder, single episode, unspecified: Secondary | ICD-10-CM

## 2014-07-26 DIAGNOSIS — F32A Depression, unspecified: Secondary | ICD-10-CM

## 2014-07-26 DIAGNOSIS — E291 Testicular hypofunction: Secondary | ICD-10-CM

## 2014-07-26 MED ORDER — LEVOTHYROXINE SODIUM 50 MCG PO TABS: 50.0000 ug | ORAL_TABLET | Freq: Every day | ORAL | Status: DC

## 2014-07-26 MED ORDER — VENLAFAXINE HCL ER 150 MG PO CP24: 150.0000 mg | ORAL_CAPSULE | Freq: Every day | ORAL | Status: DC

## 2014-07-26 MED ORDER — TESTOSTERONE CYPIONATE 200 MG/ML IM SOLN: 200.0000 mg | INTRAMUSCULAR | Status: DC

## 2014-07-26 NOTE — Progress Notes (Signed)
   Subjective:    Patient ID: Paul Parrish, male    DOB: 1971-07-11, 43 y.o.   MRN: [id number]  HPI Patient is here for routine follow up appointment.  He has hx of secondary polycythemia due to testosterone administration and he sees hematology and they have ruled out PCV and have recommended he follow up with PCP to get therapeutic blood draws/ phlebotomy.  He has hx of depression and anxiety.  He sees a endocrinologist for hypothyroidism.  He sees psychiatry for psychiatric illness.   Review of Systems    No chest pain, SOB, HA, dizziness, vision change, N/V, diarrhea, constipation, dysuria, urinary urgency or frequency, myalgias, arthralgias or rash.  Objective:   Physical Exam Vital signs noted  Well developed well nourished male.  HEENT - Head atraumatic Normocephalic                Eyes - PERRLA, Conjuctiva - clear Sclera- Clear EOMI                Ears - EAC's Wnl TM's Wnl Gross Hearing WNL                Nose - Nares patent                 Throat - oropharanx wnl Respiratory - Lungs CTA bilateral Cardiac - RRR S1 and S2 without murmur GI - Abdomen soft Nontender and bowel sounds active x 4 Extremities - No edema. Neuro - Grossly intact.       Assessment & Plan:  Hypogonadism male - Plan: testosterone cypionate (DEPOTESTOTERONE CYPIONATE) 200 MG/ML injection, Testosterone, POCT CBC, CMP14+EGFR  Depression - Plan: venlafaxine XR (EFFEXOR-XR) 150 MG 24 hr capsule, testosterone cypionate (DEPOTESTOTERONE CYPIONATE) 200 MG/ML injection  Polycythemia, secondary - Plan: testosterone cypionate (DEPOTESTOTERONE CYPIONATE) 200 MG/ML injection, POCT CBC.  Will get order for him to have therapeutic blood draws from [facility].  Other specified hypothyroidism - Plan: levothyroxine (SYNTHROID, LEVOTHROID) 50 MCG tablet, TSH  Lysbeth Penner FNP

## 2014-07-27 ENCOUNTER — Encounter: Payer: Self-pay | Admitting: Family Medicine

## 2014-07-27 LAB — CMP14+EGFR
ALT: 22 IU/L (ref 0–44)
AST: 18 IU/L (ref 0–40)
Albumin/Globulin Ratio: 1.6 (ref 1.1–2.5)
Albumin: 4.3 g/dL (ref 3.5–5.5)
Alkaline Phosphatase: 68 IU/L (ref 39–117)
BUN/Creatinine Ratio: 6 — ABNORMAL LOW (ref 9–20)
BUN: 7 mg/dL (ref 6–24)
CO2: 24 mmol/L (ref 18–29)
Calcium: 9.6 mg/dL (ref 8.7–10.2)
Chloride: 100 mmol/L (ref 97–108)
Creatinine, Ser: 1.1 mg/dL (ref 0.76–1.27)
GFR calc Af Amer: 95 mL/min/{1.73_m2} (ref >59)
GFR calc non Af Amer: 82 mL/min/{1.73_m2} (ref >59)
Globulin, Total: 2.7 g/dL (ref 1.5–4.5)
Glucose: 89 mg/dL (ref 65–99)
Potassium: 4.3 mmol/L (ref 3.5–5.2)
Sodium: 140 mmol/L (ref 134–144)
Total Bilirubin: 0.6 mg/dL (ref 0.0–1.2)
Total Protein: 7 g/dL (ref 6.0–8.5)

## 2014-07-27 LAB — CBC WITH DIFFERENTIAL
Basophils Absolute: 0.1 10*3/uL (ref 0.0–0.2)
Basos: 1 %
Eos: 2 %
Eosinophils Absolute: 0.1 10*3/uL (ref 0.0–0.4)
HCT: 54.2 % — ABNORMAL HIGH (ref 37.5–51.0)
Hemoglobin: 19.1 g/dL — ABNORMAL HIGH (ref 12.6–17.7)
Immature Grans (Abs): 0 10*3/uL (ref 0.0–0.1)
Immature Granulocytes: 0 %
Lymphocytes Absolute: 2.3 10*3/uL (ref 0.7–3.1)
Lymphs: 39 %
MCH: 30.8 pg (ref 26.6–33.0)
MCHC: 35.2 g/dL (ref 31.5–35.7)
MCV: 87 fL (ref 79–97)
Monocytes Absolute: 0.6 10*3/uL (ref 0.1–0.9)
Monocytes: 10 %
Neutrophils Absolute: 2.8 10*3/uL (ref 1.4–7.0)
Neutrophils Relative %: 48 %
Platelets: 237 10*3/uL (ref 150–379)
RBC: 6.21 x10E6/uL — ABNORMAL HIGH (ref 4.14–5.80)
RDW: 14 % (ref 12.3–15.4)
WBC: 5.8 10*3/uL (ref 3.4–10.8)

## 2014-07-27 LAB — TESTOSTERONE: Testosterone: 209 ng/dL — ABNORMAL LOW (ref 348–1197)

## 2014-07-27 LAB — TSH: TSH: 1.86 u[IU]/mL (ref 0.450–4.500)

## 2014-07-28 ENCOUNTER — Encounter (HOSPITAL_COMMUNITY)
Admission: RE | Admit: 2014-07-28 | Discharge: 2014-07-28 | Disposition: A | Discharge status: Home / Self Care | Payer: BC Managed Care – PPO | Source: Ambulatory Visit | Attending: Family Medicine | Admitting: Family Medicine

## 2014-07-28 ENCOUNTER — Other Ambulatory Visit (HOSPITAL_COMMUNITY): Payer: Medicaid Other

## 2014-07-28 ENCOUNTER — Encounter (HOSPITAL_COMMUNITY): Payer: Medicaid Other

## 2014-07-28 ENCOUNTER — Encounter (HOSPITAL_COMMUNITY): Payer: Self-pay

## 2014-07-28 ENCOUNTER — Encounter (HOSPITAL_COMMUNITY): Admission: RE | Admit: 2014-07-28 | Payer: BC Managed Care – PPO | Source: Ambulatory Visit

## 2014-07-28 DIAGNOSIS — D751 Secondary polycythemia: Secondary | ICD-10-CM | POA: Insufficient documentation

## 2014-07-28 NOTE — Progress Notes (Signed)
Paul Parrish presents today for phlebotomy per MD orders. HGB/HCT:19.1/54.2 Phlebotomy procedure started at 1442 and ended at 1448 19 ounces removed. Patient tolerated procedure well. IV needle removed intact.

## 2014-08-03 ENCOUNTER — Telehealth: Payer: Self-pay | Admitting: *Deleted

## 2014-08-09 ENCOUNTER — Other Ambulatory Visit: Payer: Self-pay | Admitting: Family Medicine

## 2014-08-09 ENCOUNTER — Encounter: Payer: Self-pay | Admitting: Family Medicine

## 2014-08-09 DIAGNOSIS — E291 Testicular hypofunction: Secondary | ICD-10-CM

## 2014-08-10 NOTE — Telephone Encounter (Signed)
Paul Parrish,     We will mail you a list of psychiatrist.  We can not refer you but you can call to schedule with whom ever you choose.  If we can be of any further assistance , let us know.

## 2014-08-12 ENCOUNTER — Encounter (HOSPITAL_COMMUNITY)
Admission: RE | Admit: 2014-08-12 | Discharge: 2014-08-12 | Disposition: A | Discharge status: Home / Self Care | Payer: BC Managed Care – PPO | Source: Ambulatory Visit | Attending: Family Medicine | Admitting: Family Medicine

## 2014-08-12 DIAGNOSIS — D751 Secondary polycythemia: Secondary | ICD-10-CM | POA: Diagnosis present

## 2014-08-12 LAB — HEMOGLOBIN AND HEMATOCRIT, BLOOD
HCT: 50.4 % (ref 39.0–52.0)
Hemoglobin: 18.1 g/dL — ABNORMAL HIGH (ref 13.0–17.0)

## 2014-08-12 NOTE — Progress Notes (Signed)
hh results faxed to Stevan Born, FNP

## 2014-08-12 NOTE — Progress Notes (Addendum)
hgb 18.1 therefore therapeutic phlebotomy performed with 1.6 lb blood obtained from left ac  without difficulty. Tolerated well. Next apt falls on holiday. Pt prefers to wait until next Friday for repeat procedure.

## 2014-08-23 ENCOUNTER — Encounter: Payer: Self-pay | Admitting: Endocrinology

## 2014-09-02 ENCOUNTER — Encounter (HOSPITAL_COMMUNITY)
Admission: RE | Admit: 2014-09-02 | Discharge: 2014-09-02 | Disposition: A | Discharge status: Home / Self Care | Payer: BC Managed Care – PPO | Source: Ambulatory Visit | Attending: Family Medicine | Admitting: Family Medicine

## 2014-09-02 DIAGNOSIS — D751 Secondary polycythemia: Secondary | ICD-10-CM | POA: Insufficient documentation

## 2014-09-02 LAB — HEMOGLOBIN AND HEMATOCRIT, BLOOD
HCT: 50.4 % (ref 39.0–52.0)
Hemoglobin: 18.2 g/dL — ABNORMAL HIGH (ref 13.0–17.0)

## 2014-09-02 NOTE — Progress Notes (Signed)
Paul Parrish presents today for phlebotomy per MD orders. HGB/HCT:18.2/50.4% Phlebotomy procedure started at 1457 and ended at 1501. 1 unit removed. Patient tolerated procedure well. IV needle removed intact. Post phlebotomy BP 145/82

## 2014-09-16 ENCOUNTER — Encounter (HOSPITAL_COMMUNITY): Admission: RE | Admit: 2014-09-16 | Payer: BC Managed Care – PPO | Source: Ambulatory Visit

## 2014-09-16 ENCOUNTER — Encounter (HOSPITAL_COMMUNITY)
Admission: RE | Admit: 2014-09-16 | Discharge: 2014-09-16 | Disposition: A | Discharge status: Home / Self Care | Payer: BC Managed Care – PPO | Source: Ambulatory Visit | Attending: Family Medicine | Admitting: Family Medicine

## 2014-10-07 ENCOUNTER — Encounter (HOSPITAL_COMMUNITY)
Admission: RE | Admit: 2014-10-07 | Discharge: 2014-10-07 | Disposition: A | Discharge status: Home / Self Care | Payer: BLUE CROSS/BLUE SHIELD | Source: Ambulatory Visit | Attending: Family Medicine | Admitting: Family Medicine

## 2014-10-07 DIAGNOSIS — D751 Secondary polycythemia: Secondary | ICD-10-CM | POA: Insufficient documentation

## 2014-10-07 LAB — HEMOGLOBIN AND HEMATOCRIT, BLOOD
HCT: 51.2 % (ref 39.0–52.0)
Hemoglobin: 18.1 g/dL — ABNORMAL HIGH (ref 13.0–17.0)

## 2014-10-07 NOTE — Progress Notes (Signed)
Results for [name], [name] (MRN [id number]) as of 10/07/2014 14:46  Ref. Range 10/07/2014 12:54  Hemoglobin Latest Range: 13.0-17.0 g/dL 18.1 (H)  HCT Latest Range: 39.0-52.0 % 51.2

## 2014-10-07 NOTE — Progress Notes (Signed)
Phlebotomy started. Needle inserted right AC. Tolerated well.

## 2014-10-07 NOTE — Progress Notes (Signed)
Here for therapeutic phlebotomy. hgb drawn and sent to lab for results. Dx polycythemia-secondary due to testosterone tx's.

## 2014-10-07 NOTE — Progress Notes (Signed)
Phlebotomy completed. Needle d/c'd. drsg to site. Tolerated well. Blood bag weight 1.2 lb. D/C to home in good condition.

## 2014-10-07 NOTE — Progress Notes (Signed)
States "I feel fine." Wants to go home. Coke and graham crackers sent home with pt. Wife at side.

## 2014-10-18 ENCOUNTER — Other Ambulatory Visit: Payer: Self-pay | Admitting: Family Medicine

## 2014-10-21 ENCOUNTER — Encounter (HOSPITAL_COMMUNITY): Admission: RE | Admit: 2014-10-21 | Payer: BLUE CROSS/BLUE SHIELD | Source: Ambulatory Visit

## 2014-11-15 ENCOUNTER — Other Ambulatory Visit: Payer: Self-pay | Admitting: Endocrinology

## 2015-05-02 ENCOUNTER — Ambulatory Visit (INDEPENDENT_AMBULATORY_CARE_PROVIDER_SITE_OTHER): Payer: Medicaid Other | Admitting: Family Medicine

## 2015-05-02 ENCOUNTER — Encounter: Payer: Self-pay | Admitting: Family Medicine

## 2015-05-02 VITALS — BP 146/100 | HR 88 | Temp 97.3°F | Ht 75.0 in | Wt 252.6 lb

## 2015-05-02 DIAGNOSIS — F329 Major depressive disorder, single episode, unspecified: Secondary | ICD-10-CM

## 2015-05-02 DIAGNOSIS — D751 Secondary polycythemia: Secondary | ICD-10-CM

## 2015-05-02 DIAGNOSIS — R21 Rash and other nonspecific skin eruption: Secondary | ICD-10-CM

## 2015-05-02 DIAGNOSIS — N522 Drug-induced erectile dysfunction: Secondary | ICD-10-CM | POA: Diagnosis not present

## 2015-05-02 DIAGNOSIS — F32A Depression, unspecified: Secondary | ICD-10-CM

## 2015-05-02 DIAGNOSIS — E039 Hypothyroidism, unspecified: Secondary | ICD-10-CM

## 2015-05-02 DIAGNOSIS — E291 Testicular hypofunction: Secondary | ICD-10-CM | POA: Diagnosis not present

## 2015-05-02 DIAGNOSIS — I10 Essential (primary) hypertension: Secondary | ICD-10-CM | POA: Insufficient documentation

## 2015-05-02 DIAGNOSIS — R030 Elevated blood-pressure reading, without diagnosis of hypertension: Secondary | ICD-10-CM | POA: Diagnosis not present

## 2015-05-02 DIAGNOSIS — N529 Male erectile dysfunction, unspecified: Secondary | ICD-10-CM | POA: Insufficient documentation

## 2015-05-02 MED ORDER — TRIAMCINOLONE ACETONIDE 0.5 % EX OINT: 1.0000 "application " | TOPICAL_OINTMENT | Freq: Two times a day (BID) | CUTANEOUS | Status: DC

## 2015-05-02 MED ORDER — SILDENAFIL CITRATE 20 MG PO TABS: ORAL_TABLET | ORAL | Status: DC

## 2015-05-02 NOTE — Assessment & Plan Note (Signed)
Trouble maintaining erections for several months now. Discussed with him that it's possibly side effect of his aspirin. Trial of sildenafil Also possibly due to low testosterone, however we are trying a trial off of it.

## 2015-05-02 NOTE — Assessment & Plan Note (Signed)
With secondary polycythemia discussed his several options Has not had therapeutic phlebotomy in quite some time For now will trial off of testosterone and trend CBC Follow-up one month, may need to do therapeutic phlebotomy in the meantime to maintain hemoglobin less than 16

## 2015-05-02 NOTE — Progress Notes (Addendum)
Patient ID: Paul Parrish, male   DOB: Jul 17, 1971, 44 y.o.   MRN: [id number]   HPI  Patient presents today for follow-up Mood disorder Follows up with psychiatry where he is given an SNRI, benzodiazepine, and Adderall He feels those medications helped him significantly, Adderall helps his fatigue  Thyroid, hypogonadism States he has generalized fatigue He can't tell that the testosterone is helping him much. He understands that he has secondary polycythemia due to testosterone administration, it's been about 3 weeks since his last testosterone dose.  Polycythemia Has been seen by hematology who feels this is due to testosterone injections With previously getting therapeutic phlebotomy, however he has not had this since January of this year, thats7 months   Has occasional moderate stomach cramps first thing in the morning after drinking cold liquids, drinks cold sweet tea or soda No nausea Notes that he's been a little bit more constipated lately but is still having a stool every 2-3 days and this is not making him feel uncomfortable.  Erectile dysfunction Has had a several month history of difficulty maintaining an erection. Would like to try medication to help. Does not feel that testosterone has been helping much  Rash Patient currently with sunburn states that he has chronic issues with scaly rash on bilateral arms, he states that they're usually in circular pattern with silvery scale on them. At times they have less scale they're usually red and raised also. No pain, or itching. He states that he has been told he has psoriasis  PMH: Smoking status noted ROS: Per HPI  Plus Positive generalized fatigue No chest pain No dyspnea No edema + rash   Objective: BP 146/100 mmHg  Pulse 88  Temp(Src) 97.3 F (36.3 C) (Oral)  Ht 6' 3"  (1.905 m)  Wt 252 lb 9.6 oz (114.579 kg)  BMI 31.57 kg/m2 Gen: NAD, alert, cooperative with exam HEENT: NCAT CV: RRR, good S1/S2, no murmur Resp:  CTABL, no wheezes, non-labored Abd: SNTND, BS present, no guarding or organomegaly Ext: No edema, warm Neuro: Alert and oriented, No gross deficits, 2+ patellar reflexes Skin: Erythema diffusely across arms and posterior back and neck consistent with sunburn, erythematous circular lesions on bilateral arms with silver scale  Assessment and plan:  Hypogonadism male With secondary polycythemia discussed his several options Has not had therapeutic phlebotomy in quite some time For now will trial off of testosterone and trend CBC Follow-up one month, may need to do therapeutic phlebotomy in the meantime to maintain hemoglobin less than 16   Depression Initiated SSRI by psychiatry Also of benzodiazepine or anxiety and Adderall for low energy, this is all managed by psychiatry in [place]  Polycythemia, secondary Checking labs today Has been evaluated by hematology who feels that it is due to testosterone injections Trial off testosterone as discussed above Follow-up one month  Hypothyroidism Checking labs today Continue current dose of Synthroid for now, if he needs testosterone and Synthroid would consider asking endocrinology to assist with testosterone as well as hypothyroidism  Erectile dysfunction Trouble maintaining erections for several months now. Discussed with him that it's possibly side effect of his aspirin. Trial of sildenafil Also possibly due to low testosterone, however we are trying a trial off of it.  Elevated blood pressure reading without diagnosis of hypertension persistently elevated blood pressure despite rest Has family history of hypertension Likely secondary to Adderall, however he feels he needs this medication and does not plan to stop anytime soon Discussed possibility of starting medication today versus  keeping a log and following up in one month. He would like follow-up Consider starting HCTZ  Rash and nonspecific skin eruption Circular  erythematous scaly rash on bilateral arms consistent with healing after a sunburn versus psoriasis Patient states he has chronic issues with it but does have a sunburn at this time Would like a trial of steroids topically    Orders Placed This Encounter  Procedures  . Testosterone,Free and Total  . CMP14+EGFR  . CBC with Differential  . TSH    Meds ordered this encounter  Medications  . Levomilnacipran HCl ER (FETZIMA) 80 MG CP24    Sig: Take by mouth.  Marland Kitchen LORazepam (ATIVAN) 0.5 MG tablet    Sig: Take 0.5 mg by mouth 2 (two) times daily.  Marland Kitchen amphetamine-dextroamphetamine (ADDERALL) 30 MG tablet    Sig: Take 1 tablet by mouth 2 (two) times daily.    Refill:  0

## 2015-05-02 NOTE — Assessment & Plan Note (Signed)
Initiated SSRI by psychiatry Also of benzodiazepine or anxiety and Adderall for low energy, this is all managed by psychiatry in [place]

## 2015-05-02 NOTE — Patient Instructions (Signed)
Great to meet you!  Lets follow up in 1 month  We will contact you in a couple of days about your labs, if they are all normal we will send a letter  Please don't hesitate to call if you need something

## 2015-05-02 NOTE — Addendum Note (Signed)
Addended by: Timmothy Euler on: 05/02/2015 12:44 PM   Modules accepted: Orders

## 2015-05-02 NOTE — Assessment & Plan Note (Signed)
Checking labs today Continue current dose of Synthroid for now, if he needs testosterone and Synthroid would consider asking endocrinology to assist with testosterone as well as hypothyroidism

## 2015-05-02 NOTE — Assessment & Plan Note (Signed)
Checking labs today Has been evaluated by hematology who feels that it is due to testosterone injections Trial off testosterone as discussed above Follow-up one month

## 2015-05-02 NOTE — Assessment & Plan Note (Addendum)
persistently elevated blood pressure despite rest Has family history of hypertension Likely secondary to Adderall, however he feels he needs this medication and does not plan to stop anytime soon Discussed possibility of starting medication today versus keeping a log and following up in one month. He would like follow-up Consider starting HCTZ

## 2015-05-02 NOTE — Assessment & Plan Note (Signed)
Circular erythematous scaly rash on bilateral arms consistent with healing after a sunburn versus psoriasis Patient states he has chronic issues with it but does have a sunburn at this time Would like a trial of steroids topically

## 2015-05-03 LAB — CMP14+EGFR
A/G RATIO: 1.6 (ref 1.1–2.5)
ALT: 37 IU/L (ref 0–44)
AST: 24 IU/L (ref 0–40)
Albumin: 4.5 g/dL (ref 3.5–5.5)
Alkaline Phosphatase: 71 IU/L (ref 39–117)
BILIRUBIN TOTAL: 0.8 mg/dL (ref 0.0–1.2)
BUN/Creatinine Ratio: 9 (ref 9–20)
BUN: 9 mg/dL (ref 6–24)
CALCIUM: 9.5 mg/dL (ref 8.7–10.2)
CO2: 23 mmol/L (ref 18–29)
CREATININE: 1.01 mg/dL (ref 0.76–1.27)
Chloride: 98 mmol/L (ref 97–108)
GFR calc Af Amer: 104 mL/min/{1.73_m2} (ref >59)
GFR calc non Af Amer: 90 mL/min/{1.73_m2} (ref >59)
GLUCOSE: 87 mg/dL (ref 65–99)
Globulin, Total: 2.8 g/dL (ref 1.5–4.5)
Potassium: 4.1 mmol/L (ref 3.5–5.2)
Sodium: 140 mmol/L (ref 134–144)
Total Protein: 7.3 g/dL (ref 6.0–8.5)

## 2015-05-03 LAB — CBC WITH DIFFERENTIAL/PLATELET
Basophils Absolute: 0 10*3/uL (ref 0.0–0.2)
Basos: 1 %
EOS (ABSOLUTE): 0.1 10*3/uL (ref 0.0–0.4)
EOS: 1 %
Hematocrit: 50.5 % (ref 37.5–51.0)
Hemoglobin: 18 g/dL — ABNORMAL HIGH (ref 12.6–17.7)
Immature Grans (Abs): 0 10*3/uL (ref 0.0–0.1)
Immature Granulocytes: 0 %
LYMPHS ABS: 2.6 10*3/uL (ref 0.7–3.1)
Lymphs: 41 %
MCH: 31.6 pg (ref 26.6–33.0)
MCHC: 35.6 g/dL (ref 31.5–35.7)
MCV: 89 fL (ref 79–97)
Monocytes Absolute: 0.6 10*3/uL (ref 0.1–0.9)
Monocytes: 9 %
NEUTROS ABS: 3.1 10*3/uL (ref 1.4–7.0)
Neutrophils: 48 %
Platelets: 319 10*3/uL (ref 150–379)
RBC: 5.7 x10E6/uL (ref 4.14–5.80)
RDW: 13.8 % (ref 12.3–15.4)
WBC: 6.4 10*3/uL (ref 3.4–10.8)

## 2015-05-03 LAB — TESTOSTERONE,FREE AND TOTAL
TESTOSTERONE FREE: 3.7 pg/mL — AB (ref 6.8–21.5)
Testosterone: 104 ng/dL — ABNORMAL LOW (ref 348–1197)

## 2015-05-03 LAB — TSH: TSH: 2.52 u[IU]/mL (ref 0.450–4.500)

## 2015-05-04 ENCOUNTER — Telehealth: Payer: Self-pay | Admitting: *Deleted

## 2015-05-04 NOTE — Telephone Encounter (Signed)
-----   Message from Timmothy Euler, MD sent at 05/04/2015  8:17 AM EDT ----- His testosterone is low, but that was expected. We are trying a month without testosterone replacement to see how he does. We can talk about it in more detail when he follows up in a month. His Hemoglobin is still high but a little lower than previous, we will recheck that in 1 month.  The rest of his labs look perfect including liver/kidney function, blood sugar, and thyroid.   Will you let him know? Thanks! Sam

## 2015-05-04 NOTE — Telephone Encounter (Signed)
Patient aware of results.

## 2015-06-02 ENCOUNTER — Ambulatory Visit (INDEPENDENT_AMBULATORY_CARE_PROVIDER_SITE_OTHER): Payer: Medicaid Other | Admitting: Family Medicine

## 2015-06-02 ENCOUNTER — Encounter: Payer: Self-pay | Admitting: Family Medicine

## 2015-06-02 ENCOUNTER — Ambulatory Visit: Payer: Medicaid Other | Admitting: Family Medicine

## 2015-06-02 VITALS — BP 133/95 | HR 67 | Temp 97.0°F | Ht 75.0 in | Wt 250.2 lb

## 2015-06-02 DIAGNOSIS — E291 Testicular hypofunction: Secondary | ICD-10-CM

## 2015-06-02 DIAGNOSIS — R030 Elevated blood-pressure reading, without diagnosis of hypertension: Secondary | ICD-10-CM | POA: Diagnosis not present

## 2015-06-02 DIAGNOSIS — F32A Depression, unspecified: Secondary | ICD-10-CM

## 2015-06-02 DIAGNOSIS — N522 Drug-induced erectile dysfunction: Secondary | ICD-10-CM

## 2015-06-02 DIAGNOSIS — D751 Secondary polycythemia: Secondary | ICD-10-CM | POA: Diagnosis not present

## 2015-06-02 DIAGNOSIS — F329 Major depressive disorder, single episode, unspecified: Secondary | ICD-10-CM

## 2015-06-02 MED ORDER — SILDENAFIL CITRATE 20 MG PO TABS: ORAL_TABLET | ORAL | Status: DC

## 2015-06-02 MED ORDER — TESTOSTERONE CYPIONATE 200 MG/ML IM SOLN: 200.0000 mg | INTRAMUSCULAR | Status: DC

## 2015-06-02 NOTE — Progress Notes (Signed)
   HPI  Patient presents today for follow-up blood pressure, and hypogonadism, and polycythemia  Elevated blood pressure - no headaches, chest pain, dyspnea  Low testosterone He is now been off of testosterone for about 6 weeks, he states that his energy has started to different and has more difficulty with sex drive and erectile dysfunction. He would like to restart testosterone despite the dangers of polycythemia that we've discussed at length.  Polycythemia He has not had therapeutic blood draws in quite some time, he understands that it's most likely due to the testosterone that he would like to continue given his low energy and sex drive.  Erectile dysfunction Helped by sildenafil, request refill  Depression He is on an SSRI, Ativan, and Adderall. He asked if I can take over prescribing due to the cost of his psychiatrist. I stated that with daily use of Ativan and Adderall think that his care is most well served by psychiatrist, will refer to psychiatry; help him find a medication covered psychiatrist.    PMH: Smoking status noted ROS: Per HPI  Objective: BP 133/95 mmHg  Pulse 67  Temp(Src) 97 F (36.1 C) (Oral)  Ht 6\' 3"  (1.905 m)  Wt 250 lb 3.2 oz (113.49 kg)  BMI 31.27 kg/m2 Gen: NAD, alert, cooperative with exam HEENT: NCAT CV: RRR, good S1/S2, no murmur Resp: CTABL, no wheezes, non-labored Ext: No edema, warm Neuro: Alert and oriented, No gross deficits  Assessment and plan:  # Hypogonadism Check testosterone, FSH, LH to try to determine if this is primary hypogonadism. He does have children so I do not believe karyotyping is important if he has findings consistent with primary hypogonadism Restart testosterone  # Polycythemia Recheck CBC, I was hoping that he would do well off of testosterone so this will hopefully resolve without therapeutic phlebotomy Will pursue therapeutic phlebotomy depending on his CBC, will contact his hematologist to find out  details of therapeutic phlebotomy  # Depression Psychiatry referral, declined management of his chronic benzodiazepine and Adderall use.  # Elevated blood pressure without diagnosis of hypertension Improved today, follow-up with routine blood pressure checks at follow-up     Orders Placed This Encounter  Procedures  . Testosterone,Free and Total  . FSH/LH  . CBC with Differential    Meds ordered this encounter  Medications  . FLUoxetine (PROZAC) 40 MG capsule    Sig: Take 40 mg by mouth daily.    Laroy Apple, MD [facility] 06/02/2015, 8:41 AM

## 2015-06-02 NOTE — Patient Instructions (Addendum)
Great to meet you again!  I will put in a referral to see if we can find a medicaid covered psychiatrist  Lets follow up in 3 months, I will try to get a hold of Dr. Barnet Glasgow to see if we can get a routine worked out for your blood draws

## 2015-06-03 LAB — CBC WITH DIFFERENTIAL/PLATELET
Basophils Absolute: 0 10*3/uL (ref 0.0–0.2)
Basos: 1 %
EOS (ABSOLUTE): 0.1 10*3/uL (ref 0.0–0.4)
EOS: 1 %
HEMATOCRIT: 48.8 % (ref 37.5–51.0)
HEMOGLOBIN: 16.6 g/dL (ref 12.6–17.7)
IMMATURE GRANS (ABS): 0 10*3/uL (ref 0.0–0.1)
Immature Granulocytes: 0 %
LYMPHS: 45 %
Lymphocytes Absolute: 2.9 10*3/uL (ref 0.7–3.1)
MCH: 30.6 pg (ref 26.6–33.0)
MCHC: 34 g/dL (ref 31.5–35.7)
MCV: 90 fL (ref 79–97)
MONOCYTES: 9 %
Monocytes Absolute: 0.6 10*3/uL (ref 0.1–0.9)
Neutrophils Absolute: 2.8 10*3/uL (ref 1.4–7.0)
Neutrophils: 44 %
Platelets: 288 10*3/uL (ref 150–379)
RBC: 5.43 x10E6/uL (ref 4.14–5.80)
RDW: 13 % (ref 12.3–15.4)
WBC: 6.4 10*3/uL (ref 3.4–10.8)

## 2015-06-03 LAB — TESTOSTERONE,FREE AND TOTAL
TESTOSTERONE: 250 ng/dL — AB (ref 348–1197)
Testosterone, Free: 7.7 pg/mL (ref 6.8–21.5)

## 2015-06-03 LAB — FSH/LH
FSH: 3 m[IU]/mL (ref 1.5–12.4)
LH: 3.8 m[IU]/mL (ref 1.7–8.6)

## 2015-06-08 ENCOUNTER — Telehealth: Payer: Self-pay | Admitting: Family Medicine

## 2015-06-08 DIAGNOSIS — E291 Testicular hypofunction: Secondary | ICD-10-CM

## 2015-06-08 NOTE — Telephone Encounter (Signed)
Called to discuss results, will try again Left voicemail.   Laroy Apple, MD [facility] 06/08/2015, 12:49 PM

## 2015-06-08 NOTE — Telephone Encounter (Signed)
Patient is returning your call about his labs

## 2015-06-08 NOTE — Telephone Encounter (Signed)
Called and discussed. Low Testerone, previously much lower but still total is low. [place] and LH are inappropriately normal leading me to think he has secondary hypogonadism.   Checking pituitary function and for hemochromatosis. If any abnormalities likely will need to pursue imaging or refer for specialist work up. I'll need to do a careful neuro exam with special attention to visual fields.   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   Laroy Apple, MD [facility] 06/08/2015, 6:01 PM   '

## 2015-06-14 ENCOUNTER — Other Ambulatory Visit: Payer: Medicaid Other

## 2015-06-14 DIAGNOSIS — R7989 Other specified abnormal findings of blood chemistry: Secondary | ICD-10-CM

## 2015-06-14 DIAGNOSIS — E229 Hyperfunction of pituitary gland, unspecified: Principal | ICD-10-CM

## 2015-06-14 DIAGNOSIS — D751 Secondary polycythemia: Secondary | ICD-10-CM

## 2015-06-14 DIAGNOSIS — E291 Testicular hypofunction: Secondary | ICD-10-CM

## 2015-06-15 ENCOUNTER — Encounter: Payer: Self-pay | Admitting: Family Medicine

## 2015-06-15 ENCOUNTER — Other Ambulatory Visit: Payer: Self-pay

## 2015-06-15 DIAGNOSIS — E229 Hyperfunction of pituitary gland, unspecified: Principal | ICD-10-CM

## 2015-06-15 DIAGNOSIS — R7989 Other specified abnormal findings of blood chemistry: Secondary | ICD-10-CM

## 2015-06-15 LAB — CBC WITH DIFFERENTIAL/PLATELET
BASOS: 0 %
Basophils Absolute: 0 10*3/uL (ref 0.0–0.2)
EOS (ABSOLUTE): 0.1 10*3/uL (ref 0.0–0.4)
EOS: 1 %
HEMATOCRIT: 47 % (ref 37.5–51.0)
Hemoglobin: 17 g/dL (ref 12.6–17.7)
IMMATURE GRANULOCYTES: 0 %
Immature Grans (Abs): 0 10*3/uL (ref 0.0–0.1)
LYMPHS ABS: 3.5 10*3/uL — AB (ref 0.7–3.1)
Lymphs: 51 %
MCH: 32.2 pg (ref 26.6–33.0)
MCHC: 36.2 g/dL — AB (ref 31.5–35.7)
MCV: 89 fL (ref 79–97)
Monocytes Absolute: 0.7 10*3/uL (ref 0.1–0.9)
Monocytes: 10 %
NEUTROS PCT: 38 %
Neutrophils Absolute: 2.6 10*3/uL (ref 1.4–7.0)
PLATELETS: 285 10*3/uL (ref 150–379)
RBC: 5.28 x10E6/uL (ref 4.14–5.80)
RDW: 13.4 % (ref 12.3–15.4)
WBC: 6.9 10*3/uL (ref 3.4–10.8)

## 2015-06-15 LAB — CORTISOL: CORTISOL: 7.4 ug/dL

## 2015-06-15 LAB — IRON: IRON: 108 ug/dL (ref 38–169)

## 2015-06-15 LAB — T4, FREE: Free T4: 1.27 ng/dL (ref 0.82–1.77)

## 2015-06-15 LAB — PROLACTIN: Prolactin: 16.4 ng/mL — ABNORMAL HIGH (ref 4.0–15.2)

## 2015-06-15 LAB — TRANSFERRIN: TRANSFERRIN: 261 mg/dL (ref 200–370)

## 2015-06-15 NOTE — Progress Notes (Signed)
Patient ID: Karla Vines, male   DOB: 1970/11/16, 44 y.o.   MRN: [id number]   I have been seeing this patient for low testosterone.  He is previously started on testosterone injections after being found to be due to testosterone deficient. He didn't develop polycythemia and was seen by a hematologist who recommended stopping testosterone or therapeutic phlebotomy.  We had a trial off of testosterone in his blood counts improved. He had return of symptoms and wanted to restart testosterone.  We pursued a complete workup for hypogonadism including FSH and LH which were inappropriately normal.  He then had a transferrin, cortisol, T4 which were all found to be normal. He had a slightly elevated prolactin at 16.4, normal upper limit is 15.2.  Considering his constellation of symptoms that think he would probably warrant review of his workup so far with urology, if he does not wish together I think that the next step would be an MRI brain to rule out prolactinoma.  For now will recommend urology appointment, referral and message sent to nursing.  Laroy Apple, MD [facility] 06/15/2015, 10:07 AM

## 2015-06-27 ENCOUNTER — Telehealth: Payer: Self-pay | Admitting: Family Medicine

## 2015-06-27 NOTE — Telephone Encounter (Signed)
Patient aware of results.

## 2015-06-27 NOTE — Addendum Note (Signed)
Addended by: Thana Ates on: 06/27/2015 01:49 PM   Modules accepted: Orders

## 2015-07-04 ENCOUNTER — Ambulatory Visit (INDEPENDENT_AMBULATORY_CARE_PROVIDER_SITE_OTHER): Payer: Medicaid Other

## 2015-07-04 DIAGNOSIS — Z23 Encounter for immunization: Secondary | ICD-10-CM

## 2015-07-14 ENCOUNTER — Encounter: Payer: Self-pay | Admitting: Family Medicine

## 2015-07-23 ENCOUNTER — Encounter: Payer: Self-pay | Admitting: Family Medicine

## 2015-07-24 ENCOUNTER — Telehealth: Payer: Self-pay

## 2015-07-24 DIAGNOSIS — F32A Depression, unspecified: Secondary | ICD-10-CM

## 2015-07-24 DIAGNOSIS — F329 Major depressive disorder, single episode, unspecified: Secondary | ICD-10-CM

## 2015-07-24 NOTE — Telephone Encounter (Signed)
Patient says he was supposed to be referred to [facility]

## 2015-08-02 ENCOUNTER — Telehealth: Payer: Self-pay

## 2015-08-02 DIAGNOSIS — F32A Depression, unspecified: Secondary | ICD-10-CM

## 2015-08-02 DIAGNOSIS — F329 Major depressive disorder, single episode, unspecified: Secondary | ICD-10-CM

## 2015-08-02 NOTE — Telephone Encounter (Signed)
Patient said you were going to put in referral for psychiatry

## 2015-08-03 NOTE — Telephone Encounter (Signed)
Psych referral written.   Laroy Apple, MD [facility] 08/03/2015, 8:09 AM

## 2015-08-07 ENCOUNTER — Encounter (HOSPITAL_BASED_OUTPATIENT_CLINIC_OR_DEPARTMENT_OTHER): Payer: Self-pay | Admitting: *Deleted

## 2015-08-07 ENCOUNTER — Emergency Department (HOSPITAL_BASED_OUTPATIENT_CLINIC_OR_DEPARTMENT_OTHER)
Admission: EM | Admit: 2015-08-07 | Discharge: 2015-08-07 | Disposition: A | Discharge status: Home / Self Care | Payer: Medicaid Other | Attending: Emergency Medicine | Admitting: Emergency Medicine

## 2015-08-07 ENCOUNTER — Emergency Department (HOSPITAL_BASED_OUTPATIENT_CLINIC_OR_DEPARTMENT_OTHER): Payer: Medicaid Other

## 2015-08-07 DIAGNOSIS — F419 Anxiety disorder, unspecified: Secondary | ICD-10-CM | POA: Diagnosis not present

## 2015-08-07 DIAGNOSIS — B349 Viral infection, unspecified: Secondary | ICD-10-CM | POA: Diagnosis not present

## 2015-08-07 DIAGNOSIS — M791 Myalgia: Secondary | ICD-10-CM | POA: Diagnosis not present

## 2015-08-07 DIAGNOSIS — R509 Fever, unspecified: Secondary | ICD-10-CM | POA: Diagnosis present

## 2015-08-07 DIAGNOSIS — F329 Major depressive disorder, single episode, unspecified: Secondary | ICD-10-CM | POA: Diagnosis not present

## 2015-08-07 DIAGNOSIS — Z79899 Other long term (current) drug therapy: Secondary | ICD-10-CM | POA: Insufficient documentation

## 2015-08-07 DIAGNOSIS — Z880 Allergy status to penicillin: Secondary | ICD-10-CM | POA: Insufficient documentation

## 2015-08-07 MED ORDER — KETOROLAC TROMETHAMINE 60 MG/2ML IM SOLN
60.0000 mg | Freq: Once | INTRAMUSCULAR | Status: AC
  Administered 2015-08-07: 60 mg via INTRAMUSCULAR
  Filled 2015-08-07: qty 2

## 2015-08-07 MED ORDER — CETIRIZINE-PSEUDOEPHEDRINE ER 5-120 MG PO TB12: 1.0000 | ORAL_TABLET | Freq: Two times a day (BID) | ORAL | Status: DC

## 2015-08-07 MED ORDER — NAPROXEN 500 MG PO TABS: 500.0000 mg | ORAL_TABLET | Freq: Two times a day (BID) | ORAL | Status: DC

## 2015-08-07 MED ORDER — BENZONATATE 100 MG PO CAPS: 100.0000 mg | ORAL_CAPSULE | Freq: Three times a day (TID) | ORAL | Status: DC

## 2015-08-07 NOTE — ED Notes (Signed)
Returned from xray

## 2015-08-07 NOTE — ED Provider Notes (Signed)
CSN: [id number]     Arrival date & time 08/07/15  0413 History   First MD Initiated Contact with Patient 08/07/15 [phone]     Chief Complaint  Patient presents with  . Fever     (Consider location/radiation/quality/duration/timing/severity/associated sxs/prior Treatment) Patient is a 44 y.o. male presenting with URI. The history is provided by the patient.  URI Presenting symptoms: congestion and cough   Presenting symptoms: no ear pain and no sore throat   Presenting symptoms comment:  "low grade temps of 99.4" and body aches Severity:  Mild Onset quality:  Gradual Timing:  Sporadic Progression:  Unchanged Chronicity:  New Relieved by:  Nothing Worsened by:  Nothing tried Ineffective treatments: a z pak and bactrim. Associated symptoms: myalgias   Associated symptoms: no arthralgias, no headaches, no neck pain, no sinus pain, no sneezing, no swollen glands and no wheezing   Risk factors: no immunosuppression     Past Medical History  Diagnosis Date  . Depression   . Anxiety   . Elevated hemoglobin ([place])   . Low testosterone    History reviewed. No pertinent past surgical history. Family History  Problem Relation Age of Onset  . Cancer Mother   . Hypertension Father   . Diabetes Father   . Diabetes Brother   . Cancer Paternal Grandmother    Social History  Substance Use Topics  . Smoking status: Never Smoker   . Smokeless tobacco: Never Used  . Alcohol Use: Yes     Comment: occ     Review of Systems  Constitutional: Negative for diaphoresis.  HENT: Positive for congestion. Negative for drooling, ear pain, sneezing and sore throat.   Eyes: Negative for photophobia.  Respiratory: Positive for cough. Negative for shortness of breath and wheezing.   Cardiovascular: Negative for chest pain.  Gastrointestinal: Negative for vomiting and diarrhea.  Musculoskeletal: Positive for myalgias. Negative for arthralgias and neck pain.  Neurological: Negative for headaches.       Allergies  Amoxicillin and Penicillins  Home Medications   Prior to Admission medications   Medication Sig Start Date End Date Taking? Authorizing Provider  amphetamine-dextroamphetamine (ADDERALL) 30 MG tablet Take 1 tablet by mouth 2 (two) times daily. 04/25/15   Historical Provider, MD  benzonatate (TESSALON) 100 MG capsule Take 1 capsule (100 mg total) by mouth every 8 (eight) hours. 08/07/15   Jarmar Rousseau, MD  cetirizine-pseudoephedrine (ZYRTEC-D) 5-120 MG tablet Take 1 tablet by mouth 2 (two) times daily. 08/07/15   Jaxyn Mestas, MD  FLUoxetine (PROZAC) 40 MG capsule Take 40 mg by mouth daily.    Historical Provider, MD  LORazepam (ATIVAN) 0.5 MG tablet Take 1 mg by mouth 3 (three) times daily.     Historical Provider, MD  naproxen (NAPROSYN) 500 MG tablet Take 1 tablet (500 mg total) by mouth 2 (two) times daily. 08/07/15   Aprille Sawhney, MD  sildenafil (REVATIO) 20 MG tablet Take 2-4 pills once daily as needed 06/02/15   Timmothy Euler, MD  testosterone cypionate (DEPOTESTOSTERONE CYPIONATE) 200 MG/ML injection Inject 1 mL (200 mg total) into the muscle every 14 (fourteen) days. 06/02/15   Timmothy Euler, MD  triamcinolone ointment (KENALOG) 0.5 % Apply 1 application topically 2 (two) times daily. For up to 10 days at a time, take 1 week off between courses 05/02/15   Timmothy Euler, MD   BP 132/82 mmHg  Pulse 79  Temp(Src) 99.7 F (37.6 C) (Oral)  Resp 18  Ht 6\' 3"  (  1.905 m)  Wt 250 lb (113.399 kg)  BMI 31.25 kg/m2  SpO2 96% Physical Exam  Constitutional: He is oriented to person, place, and time. He appears well-developed and well-nourished. No distress.  HENT:  Head: Normocephalic and atraumatic.  Mouth/Throat: No oropharyngeal exudate.  Eyes: Conjunctivae and EOM are normal. Pupils are equal, round, and reactive to light.  Neck: Normal range of motion. Neck supple.  Cardiovascular: Normal rate, regular rhythm and intact distal pulses.   Pulmonary/Chest:  Effort normal and breath sounds normal. No respiratory distress. He has no wheezes. He has no rales.  Abdominal: Soft. Bowel sounds are normal. There is no tenderness. There is no rebound and no guarding.  Musculoskeletal: Normal range of motion.  Lymphadenopathy:    He has no cervical adenopathy.  Neurological: He is alert and oriented to person, place, and time.  Skin: Skin is warm and dry.  Psychiatric: He has a normal mood and affect.    ED Course  Procedures (including critical care time) Labs Review Labs Reviewed - No data to display  Imaging Review Dg Chest 2 View  08/07/2015  CLINICAL DATA:  Fever for 3 days.  On antibiotics. EXAM: CHEST  2 VIEW COMPARISON:  01/07/2010 FINDINGS: Normal heart size and mediastinal contours. No acute infiltrate or edema. No effusion or pneumothorax. No acute osseous findings. IMPRESSION: Negative for pneumonia. Electronically Signed   By: Monte Fantasia M.D.   On: 08/07/2015 05:31   I have personally reviewed and evaluated these images and lab results as part of my medical decision-making.   EKG Interpretation None      MDM   Final diagnoses:  Viral illness    Explained to the patient and his wife that antibiotics did not help as the symptoms are consistent with a viral illness. CXR is normal.  Vital signs and exam are benign and reassuring.  Will treat with naproxen, tessalon and zyrtec D.  Close follow up with your PMD    Paytyn Mesta, MD 08/07/15 [phone]

## 2015-08-07 NOTE — Discharge Instructions (Signed)
Viral Infections °A viral infection can be caused by different types of viruses. Most viral infections are not serious and resolve on their own. However, some infections may cause severe symptoms and may lead to further complications. °SYMPTOMS °Viruses can frequently cause: °· Minor sore throat. °· Aches and pains. °· Headaches. °· Runny nose. °· Different types of rashes. °· Watery eyes. °· Tiredness. °· Cough. °· Loss of appetite. °· Gastrointestinal infections, resulting in nausea, vomiting, and diarrhea. °These symptoms do not respond to antibiotics because the infection is not caused by bacteria. However, you might catch a bacterial infection following the viral infection. This is sometimes called a "superinfection." Symptoms of such a bacterial infection may include: °· Worsening sore throat with pus and difficulty swallowing. °· Swollen neck glands. °· Chills and a high or persistent fever. °· Severe headache. °· Tenderness over the sinuses. °· Persistent overall ill feeling (malaise), muscle aches, and tiredness (fatigue). °· Persistent cough. °· Yellow, green, or brown mucus production with coughing. °HOME CARE INSTRUCTIONS  °· Only take over-the-counter or prescription medicines for pain, discomfort, diarrhea, or fever as directed by your caregiver. °· Drink enough water and fluids to keep your urine clear or pale yellow. Sports drinks can provide valuable electrolytes, sugars, and hydration. °· Get plenty of rest and maintain proper nutrition. Soups and broths with crackers or rice are fine. °SEEK IMMEDIATE MEDICAL CARE IF:  °· You have severe headaches, shortness of breath, chest pain, neck pain, or an unusual rash. °· You have uncontrolled vomiting, diarrhea, or you are unable to keep down fluids. °· You or your child has an oral temperature above 102° F (38.9° C), not controlled by medicine. °· Your baby is older than 3 months with a rectal temperature of 102° F (38.9° C) or higher. °· Your baby is 3  months old or younger with a rectal temperature of 100.4° F (38° C) or higher. °MAKE SURE YOU:  °· Understand these instructions. °· Will watch your condition. °· Will get help right away if you are not doing well or get worse. °  °This information is not intended to replace advice given to you by your health care provider. Make sure you discuss any questions you have with your health care provider. °  °Document Released: 06/26/2005 Document Revised: 12/09/2011 Document Reviewed: 02/22/2015 °Elsevier Interactive Patient Education ©2016 [employer] Inc[employer] ° °

## 2015-08-07 NOTE — ED Notes (Signed)
Pt c/o low grade fever that started on Friday. States he has recently been treated with bactrim and zpack for "bronchitis" c/o general aches. States he has taken a flu shot. Denies any sob. resp even and unlabored. C/o congestion. C/o dry cough

## 2015-08-08 ENCOUNTER — Ambulatory Visit (INDEPENDENT_AMBULATORY_CARE_PROVIDER_SITE_OTHER): Payer: Medicaid Other | Admitting: Family Medicine

## 2015-08-08 ENCOUNTER — Other Ambulatory Visit: Payer: Self-pay | Admitting: Family Medicine

## 2015-08-08 ENCOUNTER — Encounter: Payer: Self-pay | Admitting: Family Medicine

## 2015-08-08 VITALS — BP 132/92 | HR 95 | Temp 98.8°F | Ht 75.0 in | Wt 253.0 lb

## 2015-08-08 DIAGNOSIS — M791 Myalgia, unspecified site: Secondary | ICD-10-CM

## 2015-08-08 DIAGNOSIS — B349 Viral infection, unspecified: Secondary | ICD-10-CM | POA: Diagnosis not present

## 2015-08-08 DIAGNOSIS — R509 Fever, unspecified: Secondary | ICD-10-CM

## 2015-08-08 LAB — POCT INFLUENZA A/B
INFLUENZA A, POC: NEGATIVE
Influenza B, POC: NEGATIVE

## 2015-08-08 NOTE — Patient Instructions (Addendum)
Rest  Drink Plenty of Fluids  Take tylenol or ibuprofen as needed for fever  We will call you once your lab results come back in.  If you receive a survey please fill it out.

## 2015-08-08 NOTE — Progress Notes (Signed)
Subjective:    Patient ID: Paul Parrish, male    DOB: 1971/09/06, 44 y.o.   MRN: [id number]  HPI Patient here due to low grade fever since Friday 11/4. Went to urgent care 11/7 with tempeture spike of 101.8, here to follow up. The patient has had fever and chills and myalgias. The patient had a chest x-ray rate at current urgent care on [address] and this was negative according to the patient. He had taken a course of Bactrim from another urgent care center and this was discontinued by the emergency room on [address]. He did not have a rash are reaction to the medicine unless it was the fever. The patient said he had had a flu shot in early October and that he had felt bad since he did receive the flu shot.  Patient Active Problem List   Diagnosis Date Noted  . Erectile dysfunction 05/02/2015  . Elevated blood pressure reading without diagnosis of hypertension 05/02/2015  . Rash and nonspecific skin eruption 05/02/2015  . Polycythemia, secondary 06/25/2014  . Hypothyroidism 12/01/2013  . Hypogonadism male 12/01/2013  . Depression 01/07/2013   Outpatient Encounter Prescriptions as of 08/08/2015  Medication Sig  . FLUoxetine (PROZAC) 40 MG capsule Take 40 mg by mouth daily.  Marland Kitchen LORazepam (ATIVAN) 1 MG tablet Take 1 mg by mouth every 8 (eight) hours.  Marland Kitchen amphetamine-dextroamphetamine (ADDERALL) 30 MG tablet Take 1 tablet by mouth 2 (two) times daily.  . benzonatate (TESSALON) 100 MG capsule Take 1 capsule (100 mg total) by mouth every 8 (eight) hours.  . cetirizine-pseudoephedrine (ZYRTEC-D) 5-120 MG tablet Take 1 tablet by mouth 2 (two) times daily.  Marland Kitchen FLUoxetine (PROZAC) 40 MG capsule Take 40 mg by mouth daily.  Marland Kitchen LORazepam (ATIVAN) 0.5 MG tablet Take 1 mg by mouth 3 (three) times daily.   . naproxen (NAPROSYN) 500 MG tablet Take 1 tablet (500 mg total) by mouth 2 (two) times daily.  . sildenafil (REVATIO) 20 MG tablet Take 2-4 pills once daily as needed  . testosterone cypionate (DEPOTESTOSTERONE  CYPIONATE) 200 MG/ML injection Inject 1 mL (200 mg total) into the muscle every 14 (fourteen) days.  Marland Kitchen triamcinolone ointment (KENALOG) 0.5 % Apply 1 application topically 2 (two) times daily. For up to 10 days at a time, take 1 week off between courses   No facility-administered encounter medications on file as of 08/08/2015.       Review of Systems  Constitutional: Positive for fever and chills.  HENT: Negative.   Eyes: Negative.   Respiratory: Negative.   Cardiovascular: Negative.   Gastrointestinal: Negative.   Endocrine: Negative.   Genitourinary: Negative.   Musculoskeletal: Positive for myalgias.  Skin: Negative.   Allergic/Immunologic: Negative.   Neurological: Negative.   Hematological: Negative.   Psychiatric/Behavioral: Negative.        Objective:   Physical Exam  Constitutional: He is oriented to person, place, and time. He appears well-developed and well-nourished. No distress.  HENT:  Head: Normocephalic and atraumatic.  Right Ear: External ear normal.  Left Ear: External ear normal.  Mouth/Throat: No oropharyngeal exudate.  There is nasal congestion and turbinate swelling bilaterally. The throat is slightly red posteriorly. There is slight sinus pressure and tenderness in the right maxillary sinus area.  Eyes: Conjunctivae and EOM are normal. Pupils are equal, round, and reactive to light. Right eye exhibits no discharge. Left eye exhibits no discharge. No scleral icterus.  Neck: Normal range of motion. Neck supple. No thyromegaly present.  There is a small anterior cervical node on the left. This is tender to palpation.  Cardiovascular: Normal rate, regular rhythm and normal heart sounds.   No murmur heard. Pulmonary/Chest: Effort normal and breath sounds normal. No respiratory distress. He has no wheezes. He has no rales. He exhibits no tenderness.  Clear anteriorly and posteriorly  Abdominal: Soft. Bowel sounds are normal. He exhibits no mass. There is no  tenderness. There is no rebound and no guarding.  No abdominal tenderness  Genitourinary: Rectum normal.  Musculoskeletal: Normal range of motion. He exhibits no edema.  Lymphadenopathy:    He has cervical adenopathy.  Neurological: He is alert and oriented to person, place, and time.  Skin: Skin is warm and dry. No rash noted.  The skin is somewhat clammy and no tactile fever  Psychiatric: He has a normal mood and affect. His behavior is normal. Judgment and thought content normal.  Nursing note and vitals reviewed.  BP 132/92 mmHg  Pulse 95  Temp(Src) 98.8 F (37.1 C) (Oral)  Ht 6\' 3"  (1.905 m)  Wt 253 lb (114.76 kg)  BMI 31.62 kg/m2  Results for orders placed or performed in visit on 08/08/15  POCT Influenza A/B  Result Value Ref Range   Influenza A, POC Negative Negative   Influenza B, POC Negative Negative         Assessment & Plan:   1. Myalgia -Take Tylenol for aches pains and fever - POCT Influenza A/B - CBC with Differential/Platelet  2. Fever, unspecified fever cause -Tylenol for aches pains and fever  -POCT Influenza A/B - CBC with Differential/Platelet  3. Viral syndrome -Rest and fluids -Remain out of work for the next 2-3 days  Patient Instructions  Rest  Drink Plenty of Fluids  Take tylenol or ibuprofen as needed for fever  We will call you once your lab results come back in.  If you receive a survey please fill it out.    Arrie Senate MD

## 2015-08-09 ENCOUNTER — Telehealth (HOSPITAL_COMMUNITY): Payer: Self-pay | Admitting: *Deleted

## 2015-08-09 LAB — CBC WITH DIFFERENTIAL/PLATELET
BASOS ABS: 0 10*3/uL (ref 0.0–0.2)
Basos: 1 %
EOS (ABSOLUTE): 0.1 10*3/uL (ref 0.0–0.4)
Eos: 2 %
HEMOGLOBIN: 18 g/dL — AB (ref 12.6–17.7)
Hematocrit: 53.3 % — ABNORMAL HIGH (ref 37.5–51.0)
IMMATURE GRANS (ABS): 0 10*3/uL (ref 0.0–0.1)
IMMATURE GRANULOCYTES: 0 %
LYMPHS: 31 %
Lymphocytes Absolute: 1.6 10*3/uL (ref 0.7–3.1)
MCH: 31 pg (ref 26.6–33.0)
MCHC: 33.8 g/dL (ref 31.5–35.7)
MCV: 92 fL (ref 79–97)
MONOCYTES: 19 %
Monocytes Absolute: 1 10*3/uL — ABNORMAL HIGH (ref 0.1–0.9)
NEUTROS ABS: 2.5 10*3/uL (ref 1.4–7.0)
NEUTROS PCT: 47 %
PLATELETS: 174 10*3/uL (ref 150–379)
RBC: 5.81 x10E6/uL — ABNORMAL HIGH (ref 4.14–5.80)
RDW: 14.3 % (ref 12.3–15.4)
WBC: 5.4 10*3/uL (ref 3.4–10.8)

## 2015-08-31 ENCOUNTER — Other Ambulatory Visit: Payer: Self-pay

## 2015-08-31 DIAGNOSIS — N522 Drug-induced erectile dysfunction: Secondary | ICD-10-CM

## 2015-08-31 MED ORDER — SILDENAFIL CITRATE 20 MG PO TABS: ORAL_TABLET | ORAL | Status: DC

## 2015-08-31 NOTE — Telephone Encounter (Signed)
Saw Dr Wendi Snipes  06/02/15  If approved print

## 2015-09-04 ENCOUNTER — Ambulatory Visit (INDEPENDENT_AMBULATORY_CARE_PROVIDER_SITE_OTHER): Payer: Medicaid Other | Admitting: Family Medicine

## 2015-09-04 ENCOUNTER — Encounter: Payer: Self-pay | Admitting: Family Medicine

## 2015-09-04 ENCOUNTER — Ambulatory Visit: Payer: Medicaid Other | Admitting: Family Medicine

## 2015-09-04 VITALS — BP 140/85 | HR 74 | Temp 98.0°F | Ht 75.0 in | Wt 254.2 lb

## 2015-09-04 DIAGNOSIS — E291 Testicular hypofunction: Secondary | ICD-10-CM

## 2015-09-04 NOTE — Progress Notes (Signed)
   HPI  Patient presents today to discuss fatigue and anorexia  He has had continued symptoms since our last visit. He's had anorexia over the last 2 months or so.   He is on prozac and ativan for anxiety/depression.  His recent labs are normal including Hgb and thyroid.   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    PMH: Smoking status noted ROS: Per HPI  Objective: BP 140/85 mmHg  Pulse 74  Temp(Src) 98 F (36.7 C) (Oral)  Ht 6\' 3"  (1.905 m)  Wt 254 lb 3.2 oz (115.304 kg)  BMI 31.77 kg/m2 Gen: NAD, alert, cooperative with exam HEENT: NCAT CV: RRR, good S1/S2, no murmur Resp: CTABL, no wheezes, non-labored Abd: SNTND, BS present, no guarding or organomegaly Ext: No edema, warm Neuro: Alert and oriented, No gross deficits  Assessment and plan:  # fatigue Multifactorial, likely mood and testosterone related.  He previously had a PA at psychiatry and is on a stable dose of SSRI and benzo All labs normal except Testeroene as below Recommend psych follow up considering chronic benzo He is also on adderall 10 mg BID  # Hypogonadism I was previously under the false assumption that he stopped testosterone injections when we both decided to stop them, on review of the controlled substance database i see that he has not.  I  Have called and confirmed, he has not stopped.  Future labs- testosterone and CBC, previously high Hgb presumable 2/2 testosterone injections Repeat prolactin as this was only mildly elevated. Perhaps will send to urology with clearly needing testosterone but having side effects with elevated hgb for further recommendations.     Laroy Apple, MD [facility] 09/04/2015, 5:07 PM

## 2015-09-04 NOTE — Patient Instructions (Signed)
Great to see you!  Call behavioral health, try to get the appointment, I think you would get good treatment there.   Lets see you back in 6 months  Be sure to follow up with the urologists

## 2015-09-06 ENCOUNTER — Telehealth: Payer: Self-pay | Admitting: Family Medicine

## 2015-09-08 ENCOUNTER — Other Ambulatory Visit (INDEPENDENT_AMBULATORY_CARE_PROVIDER_SITE_OTHER): Payer: Medicaid Other

## 2015-09-08 DIAGNOSIS — E291 Testicular hypofunction: Secondary | ICD-10-CM | POA: Diagnosis not present

## 2015-09-08 NOTE — Progress Notes (Signed)
LAB only 

## 2015-09-09 LAB — CBC
HEMATOCRIT: 51.7 % — AB (ref 37.5–51.0)
Hemoglobin: 17.9 g/dL — ABNORMAL HIGH (ref 12.6–17.7)
MCH: 30.5 pg (ref 26.6–33.0)
MCHC: 34.6 g/dL (ref 31.5–35.7)
MCV: 88 fL (ref 79–97)
PLATELETS: 255 10*3/uL (ref 150–379)
RBC: 5.87 x10E6/uL — ABNORMAL HIGH (ref 4.14–5.80)
RDW: 14.2 % (ref 12.3–15.4)
WBC: 5.4 10*3/uL (ref 3.4–10.8)

## 2015-09-09 LAB — TESTOSTERONE: Testosterone: 369 ng/dL (ref 348–1197)

## 2015-09-09 LAB — PROLACTIN: Prolactin: 14.5 ng/mL (ref 4.0–15.2)

## 2015-09-11 ENCOUNTER — Other Ambulatory Visit: Payer: Self-pay | Admitting: Family Medicine

## 2015-09-11 MED ORDER — FLUOXETINE HCL 40 MG PO CAPS: 40.0000 mg | ORAL_CAPSULE | Freq: Every day | ORAL | Status: DC

## 2015-09-20 ENCOUNTER — Ambulatory Visit (INDEPENDENT_AMBULATORY_CARE_PROVIDER_SITE_OTHER): Payer: Medicaid Other | Admitting: Family Medicine

## 2015-09-20 ENCOUNTER — Encounter: Payer: Self-pay | Admitting: Family Medicine

## 2015-09-20 VITALS — BP 130/87 | HR 81 | Temp 97.6°F | Ht 75.0 in | Wt 260.0 lb

## 2015-09-20 DIAGNOSIS — J019 Acute sinusitis, unspecified: Secondary | ICD-10-CM

## 2015-09-20 MED ORDER — AZITHROMYCIN 250 MG PO TABS: ORAL_TABLET | ORAL | Status: DC

## 2015-09-20 MED ORDER — PREDNISONE 20 MG PO TABS: ORAL_TABLET | ORAL | Status: DC

## 2015-09-20 NOTE — Progress Notes (Signed)
BP 130/87 mmHg  Pulse 81  Temp(Src) 97.6 F (36.4 C) (Oral)  Ht 6\' 3"  (1.905 m)  Wt 260 lb (117.935 kg)  BMI 32.50 kg/m2   Subjective:    Patient ID: Paul Parrish, male    DOB: 22-Jul-1971, 44 y.o.   MRN: [id number]  HPI: Paul Parrish is a 44 y.o. male presenting on 09/20/2015 for Sinusitis; Bronchitis; and Cough   HPI Sinus congestion and chest congestion and cough Patient has been having sinus congestion and sinus pressure and postnasal drainage and sore throat and cough for the past few weeks. It has come and gone over the past couple months though. He feels like he got better from one of the earlier times in October and then got worse again. He says he gets a sinus pressure and infection every year around this time year. He has tried Mucinex and an antihistamine without much success. Initially in October he did have an antibiotic that seemed to help but didn't fully clear everything up. She also has nasal steroid but has not been using that. He denies any sick contacts besides his wife that he knows of. And she has had a mild congestion prior to him getting ill. He is also been using Tylenol and Advil. He feels over the past week that is getting worse and his headaches from his sinus pressure or getting worse.  Relevant past medical, surgical, family and social history reviewed and updated as indicated. Interim medical history since our last visit reviewed. Allergies and medications reviewed and updated.  Review of Systems  Constitutional: Negative for fever and chills.  HENT: Positive for congestion, postnasal drip, rhinorrhea, sinus pressure and sore throat. Negative for ear discharge, ear pain, sneezing and voice change.   Eyes: Negative for pain, discharge, redness and visual disturbance.  Respiratory: Positive for cough. Negative for chest tightness, shortness of breath and wheezing.   Cardiovascular: Negative for chest pain and leg swelling.  Gastrointestinal: Negative for  abdominal pain, diarrhea and constipation.  Genitourinary: Negative for difficulty urinating.  Musculoskeletal: Negative for back pain and gait problem.  Skin: Negative for rash.  Neurological: Negative for syncope, light-headedness and headaches.  All other systems reviewed and are negative.   Per HPI unless specifically indicated above     Medication List       This list is accurate as of: 09/20/15  6:20 PM.  Always use your most recent med list.               azithromycin 250 MG tablet  Commonly known as:  ZITHROMAX  Take 2 the first day and then one each day after.     FLUoxetine 40 MG capsule  Commonly known as:  PROZAC  Take 1 capsule (40 mg total) by mouth daily.     LORazepam 1 MG tablet  Commonly known as:  ATIVAN  Take 1 mg by mouth every 8 (eight) hours.     MUCINEX SINUS-MAX 10-650-400 MG/20ML Liqd  Generic drug:  Phenylephrine-APAP-Guaifenesin  Take by mouth.     predniSONE 20 MG tablet  Commonly known as:  DELTASONE  2 po at same time daily for 5 days     testosterone cypionate 200 MG/ML injection  Commonly known as:  DEPOTESTOSTERONE CYPIONATE  Inject 1 mL (200 mg total) into the muscle every 14 (fourteen) days.     triamcinolone ointment 0.5 %  Commonly known as:  KENALOG  Apply 1 application topically 2 (two) times daily. For up to  10 days at a time, take 1 week off between courses           Objective:    BP 130/87 mmHg  Pulse 81  Temp(Src) 97.6 F (36.4 C) (Oral)  Ht 6\' 3"  (1.905 m)  Wt 260 lb (117.935 kg)  BMI 32.50 kg/m2  Wt Readings from Last 3 Encounters:  09/20/15 260 lb (117.935 kg)  09/04/15 254 lb 3.2 oz (115.304 kg)  08/08/15 253 lb (114.76 kg)    Physical Exam  Constitutional: He is oriented to person, place, and time. He appears well-developed and well-nourished. No distress.  HENT:  Right Ear: Tympanic membrane, external ear and ear canal normal.  Left Ear: Tympanic membrane, external ear and ear canal normal.    Nose: Mucosal edema and rhinorrhea present. No sinus tenderness. No epistaxis. Right sinus exhibits maxillary sinus tenderness. Right sinus exhibits no frontal sinus tenderness. Left sinus exhibits maxillary sinus tenderness. Left sinus exhibits no frontal sinus tenderness.  Mouth/Throat: Uvula is midline and mucous membranes are normal. Posterior oropharyngeal edema and posterior oropharyngeal erythema present. No oropharyngeal exudate or tonsillar abscesses.  Eyes: Conjunctivae and EOM are normal. Pupils are equal, round, and reactive to light. Right eye exhibits no discharge. No scleral icterus.  Cardiovascular: Normal rate, regular rhythm, normal heart sounds and intact distal pulses.   No murmur heard. Pulmonary/Chest: Effort normal and breath sounds normal. No respiratory distress. He has no wheezes.  Abdominal: He exhibits no distension.  Musculoskeletal: Normal range of motion. He exhibits no edema.  Neurological: He is alert and oriented to person, place, and time. Coordination normal.  Skin: Skin is warm and dry. No rash noted. He is not diaphoretic.  Psychiatric: He has a normal mood and affect. His behavior is normal.  Vitals reviewed.   Results for orders placed or performed in visit on 09/08/15  CBC  Result Value Ref Range   WBC 5.4 3.4 - 10.8 x10E3/uL   RBC 5.87 (H) 4.14 - 5.80 x10E6/uL   Hemoglobin 17.9 (H) 12.6 - 17.7 g/dL   Hematocrit 51.7 (H) 37.5 - 51.0 %   MCV 88 79 - 97 fL   MCH 30.5 26.6 - 33.0 pg   MCHC 34.6 31.5 - 35.7 g/dL   RDW 14.2 12.3 - 15.4 %   Platelets 255 150 - 379 x10E3/uL  Testosterone  Result Value Ref Range   Testosterone 369 348 - 1197 ng/dL   Comment, Testosterone Comment   Prolactin  Result Value Ref Range   Prolactin 14.5 4.0 - 15.2 ng/mL      Assessment & Plan:   Problem List Items Addressed This Visit    None    Visit Diagnoses    Acute rhinosinusitis    -  Primary    It is been going on for 2-3 weeks, will send azithromycin and  prednisone    Relevant Medications    Phenylephrine-APAP-Guaifenesin (MUCINEX SINUS-MAX) 10-650-400 MG/20ML LIQD    predniSONE (DELTASONE) 20 MG tablet    azithromycin (ZITHROMAX) 250 MG tablet        Follow up plan: Return if symptoms worsen or fail to improve.  Counseling provided for all of the vaccine components No orders of the defined types were placed in this encounter.    [name], MD [facility] 09/20/2015, 6:20 PM

## 2015-09-26 ENCOUNTER — Other Ambulatory Visit: Payer: Self-pay | Admitting: Family Medicine

## 2015-09-26 ENCOUNTER — Encounter: Payer: Self-pay | Admitting: Family Medicine

## 2015-09-26 MED ORDER — LORAZEPAM 1 MG PO TABS: 1.0000 mg | ORAL_TABLET | Freq: Three times a day (TID) | ORAL | Status: DC

## 2015-09-26 NOTE — Telephone Encounter (Signed)
Please advise and route to pools. 

## 2015-09-26 NOTE — Telephone Encounter (Signed)
Please contact the patient to let him know I wrote a 10 day prescription to tide him over until Dr. Wendi Snipes returns. It will be up to Dr. Wendi Snipes to determine if further prescriptions will need to come from a psychiatrist.Thanks, WS

## 2015-09-26 NOTE — Telephone Encounter (Signed)
Scrip written. I will sign. Only 10 day supply given. Dr. Wendi Snipes will need to see him to make the final decision.

## 2015-09-27 NOTE — Telephone Encounter (Signed)
Pt notifed of RX 

## 2015-09-28 ENCOUNTER — Ambulatory Visit (INDEPENDENT_AMBULATORY_CARE_PROVIDER_SITE_OTHER): Payer: Medicaid Other | Admitting: Family Medicine

## 2015-09-28 VITALS — Temp 98.0°F | Ht 75.0 in | Wt 259.6 lb

## 2015-09-28 DIAGNOSIS — F411 Generalized anxiety disorder: Secondary | ICD-10-CM | POA: Diagnosis not present

## 2015-09-28 DIAGNOSIS — F4010 Social phobia, unspecified: Secondary | ICD-10-CM

## 2015-09-28 DIAGNOSIS — F4000 Agoraphobia, unspecified: Secondary | ICD-10-CM | POA: Diagnosis not present

## 2015-09-28 MED ORDER — ALPRAZOLAM 0.5 MG PO TABS: 0.5000 mg | ORAL_TABLET | Freq: Two times a day (BID) | ORAL | Status: DC

## 2015-09-28 MED ORDER — DULOXETINE HCL 60 MG PO CPEP: 60.0000 mg | ORAL_CAPSULE | Freq: Every day | ORAL | Status: DC

## 2015-09-28 NOTE — Progress Notes (Signed)
Subjective:  Patient ID: Paul Parrish, male    DOB: May 24, 1971  Age: 44 y.o. MRN: [id number]  CC: GAD   HPI Paul Parrish presents for recheck of his anxiety symptoms. He says that he is needing more and more lorazepam to give him less and less relief. He would like to have his dose increased. However after after further discussion it is more apparent that he would simply like to have better relief of his anxiety and realizes the lorazepam has not been sufficient for that. He started using extra tablets increasing the lorazepam from 3-4 daily. He has now run out completely and is in danger of withdrawal from the benzodiazepine if he waits until he can refill the lorazepam on January 6.  GAD 7 : Generalized Anxiety Score 09/28/2015  Nervous, Anxious, on Edge 3  Control/stop worrying 3  Worry too much - different things 3  Trouble relaxing 2  Restless 0  Easily annoyed or irritable 0  Afraid - awful might happen 0  Total GAD 7 Score 11  Anxiety Difficulty Somewhat difficult       History Paul Parrish has a past medical history of Depression; Anxiety; Elevated hemoglobin ([place]); and Low testosterone.   He has no past surgical history on file.   His family history includes Cancer in his mother and paternal grandmother; Diabetes in his brother and father; Hypertension in his father.He reports that he has never smoked. He has never used smokeless tobacco. He reports that he drinks alcohol. He reports that he does not use illicit drugs.  Outpatient Prescriptions Prior to Visit  Medication Sig Dispense Refill  . FLUoxetine (PROZAC) 40 MG capsule Take 1 capsule (40 mg total) by mouth daily. 90 capsule 3  . LORazepam (ATIVAN) 1 MG tablet Take 1 tablet (1 mg total) by mouth every 8 (eight) hours. 30 tablet 0  . testosterone cypionate (DEPOTESTOSTERONE CYPIONATE) 200 MG/ML injection Inject 1 mL (200 mg total) into the muscle every 14 (fourteen) days. 10 mL 0  . Phenylephrine-APAP-Guaifenesin (MUCINEX  SINUS-MAX) 10-650-400 MG/20ML LIQD Take by mouth.    . predniSONE (DELTASONE) 20 MG tablet 2 po at same time daily for 5 days 10 tablet 0  . triamcinolone ointment (KENALOG) 0.5 % Apply 1 application topically 2 (two) times daily. For up to 10 days at a time, take 1 week off between courses (Patient not taking: Reported on 09/20/2015) 30 g 1   No facility-administered medications prior to visit.    ROS Review of Systems  Constitutional: Negative for fever, chills, diaphoresis and unexpected weight change.  HENT: Negative for congestion, hearing loss, rhinorrhea and sore throat.   Eyes: Negative for visual disturbance.  Respiratory: Negative for cough and shortness of breath.   Cardiovascular: Negative for chest pain.  Gastrointestinal: Negative for abdominal pain, diarrhea and constipation.  Genitourinary: Negative for dysuria and flank pain.  Musculoskeletal: Negative for joint swelling and arthralgias.  Skin: Negative for rash.  Neurological: Negative for dizziness and headaches.  Psychiatric/Behavioral: Negative for sleep disturbance and dysphoric mood.    Objective:  Temp(Src) 98 F (36.7 C) (Oral)  Ht 6\' 3"  (1.905 m)  Wt 259 lb 9.6 oz (117.754 kg)  BMI 32.45 kg/m2  BP Readings from Last 3 Encounters:  09/20/15 130/87  09/04/15 140/85  08/08/15 132/92    Wt Readings from Last 3 Encounters:  09/28/15 259 lb 9.6 oz (117.754 kg)  09/20/15 260 lb (117.935 kg)  09/04/15 254 lb 3.2 oz (115.304 kg)  Physical Exam  Constitutional: He is oriented to person, place, and time. He appears well-developed and well-nourished. No distress.  HENT:  Head: Normocephalic and atraumatic.  Right Ear: External ear normal.  Left Ear: External ear normal.  Nose: Nose normal.  Mouth/Throat: Oropharynx is clear and moist.  Eyes: Conjunctivae and EOM are normal. Pupils are equal, round, and reactive to light.  Neck: Normal range of motion. Neck supple. No thyromegaly present.    Cardiovascular: Normal rate, regular rhythm and normal heart sounds.   No murmur heard. Pulmonary/Chest: Effort normal and breath sounds normal. No respiratory distress. He has no wheezes. He has no rales.  Abdominal: Soft. Bowel sounds are normal. He exhibits no distension. There is no tenderness.  Lymphadenopathy:    He has no cervical adenopathy.  Neurological: He is alert and oriented to person, place, and time. He has normal reflexes.  Skin: Skin is warm and dry.  Psychiatric: He has a normal mood and affect. His behavior is normal. Judgment and thought content normal.     Lab Results  Component Value Date   WBC 5.4 09/08/2015   HGB 18.1* 10/07/2014   HCT 51.7* 09/08/2015   PLT 237 07/26/2014   GLUCOSE 87 05/02/2015   CHOL 212* 05/18/2014   TRIG 361* 05/18/2014   HDL 29* 05/18/2014   LDLCALC 111* 05/18/2014   ALT 37 05/02/2015   AST 24 05/02/2015   NA 140 05/02/2015   K 4.1 05/02/2015   CL 98 05/02/2015   CREATININE 1.01 05/02/2015   BUN 9 05/02/2015   CO2 23 05/02/2015   TSH 2.520 05/02/2015   PSA 1.1 01/31/2014   HGBA1C 4.9% 08/13/2013    Dg Chest 2 View  08/07/2015  CLINICAL DATA:  Fever for 3 days.  On antibiotics. EXAM: CHEST  2 VIEW COMPARISON:  01/07/2010 FINDINGS: Normal heart size and mediastinal contours. No acute infiltrate or edema. No effusion or pneumothorax. No acute osseous findings. IMPRESSION: Negative for pneumonia. Electronically Signed   By: Monte Fantasia M.D.   On: 08/07/2015 05:31    Assessment & Plan:   Paul Parrish was seen today for gad.  Diagnoses and all orders for this visit:  GAD (generalized anxiety disorder)  Social phobia  Agoraphobia  Other orders -     ALPRAZolam (XANAX) 0.5 MG tablet; Take 1 tablet (0.5 mg total) by mouth 2 (two) times daily. -     DULoxetine (CYMBALTA) 60 MG capsule; Take 1 capsule (60 mg total) by mouth daily.   I have discontinued Paul Parrish triamcinolone ointment, Phenylephrine-APAP-Guaifenesin, and  predniSONE. I am also having him start on ALPRAZolam and DULoxetine. Additionally, I am having him maintain his testosterone cypionate, FLUoxetine, and LORazepam.  Meds ordered this encounter  Medications  . ALPRAZolam (XANAX) 0.5 MG tablet    Sig: Take 1 tablet (0.5 mg total) by mouth 2 (two) times daily.    Dispense:  16 tablet    Refill:  0  . DULoxetine (CYMBALTA) 60 MG capsule    Sig: Take 1 capsule (60 mg total) by mouth daily.    Dispense:  30 capsule    Refill:  3   . Discontinue the Prozac and lorazepam right away. Start the cymbalta (duloxetine) when your next dose of prozac is due. Take it with a full meal of protein food to avoid nausea.   Instead of lorazepam, use xanax (alprazolam) twice daily until you see Dr. Wendi Snipes next week. He was cautioned regarding using extra medication. We discussed tapering  off of the alprazolam as quickly as possible but that the ultimate decision rests with his PCP, Dr. Wendi Snipes if he uses the alprazolam as directed twice daily he should have enough to last 8 days therefore he must see Dr. Wendi Snipes by that time. I encouraged him that Cymbalta was a superior medication for anxiety to both Prozac and lorazepam therefore he should be able to replace both of those with 1 Cymbalta daily once he is tapered off of the benzodiazepine.  Follow-up: Return in about 8 days (around 10/06/2015), or with Dr. Wendi Snipes.  Claretta Fraise, M.D.

## 2015-09-28 NOTE — Patient Instructions (Signed)
Discontinue the Prozac and lorazepam right away. Start the cymbalta (duloxetine) when your next dose of prozac is due. Take it with a full meal of protein food to avoid nausea.   Instead of lorazepam, use xanax (alprazolam) twice daily until you see Dr. Wendi Snipes next week.

## 2015-10-02 ENCOUNTER — Encounter: Payer: Self-pay | Admitting: Family Medicine

## 2015-10-06 ENCOUNTER — Encounter: Payer: Self-pay | Admitting: Family Medicine

## 2015-10-06 ENCOUNTER — Ambulatory Visit (INDEPENDENT_AMBULATORY_CARE_PROVIDER_SITE_OTHER): Payer: Medicaid Other | Admitting: Family Medicine

## 2015-10-06 VITALS — BP 124/79 | HR 91 | Temp 97.1°F | Ht 75.0 in | Wt 256.8 lb

## 2015-10-06 DIAGNOSIS — F419 Anxiety disorder, unspecified: Secondary | ICD-10-CM | POA: Diagnosis not present

## 2015-10-06 DIAGNOSIS — F329 Major depressive disorder, single episode, unspecified: Secondary | ICD-10-CM

## 2015-10-06 DIAGNOSIS — F32A Depression, unspecified: Secondary | ICD-10-CM

## 2015-10-06 MED ORDER — ALPRAZOLAM 0.5 MG PO TABS: 0.5000 mg | ORAL_TABLET | Freq: Two times a day (BID) | ORAL | Status: DC

## 2015-10-06 MED ORDER — GABAPENTIN 300 MG PO CAPS: 300.0000 mg | ORAL_CAPSULE | Freq: Two times a day (BID) | ORAL | Status: DC

## 2015-10-06 NOTE — Progress Notes (Signed)
   HPI  Patient presents today here today to discuss anxiety.  Patient explains that he was previously seen a psychiatrist who is treating him with Prozac and Ativan.  He was taking more Ativan than he was supposed to per day, he is prescribed 3 pills per day and taking up to 5 daily. When he ran out early his psychiatrist refused to refill it early, and now he will not see him anymore. He also explains that the psychiatrist is too expensive as he was self pay  He explains that he does not think that he has any depression, he feels that he has social anxiety disorder. He states that the Ativan sometimes they simply on numb and like a zombie He states that he had more energy using Xanax instead of Ativan, however he did not feel there was enough pills to cover resolving symptoms. He's tried Klonopin without any improvement previously. He states that he is having a difficult time finding a psychiatrist.  He did not try Cymbalta, he explained that he was previously on it and had a bad reaction. He did not realize this until he was at home.   PMH: Smoking status noted ROS: Per HPI  Objective: BP 124/79 mmHg  Pulse 91  Temp(Src) 97.1 F (36.2 C) (Oral)  Ht 6\' 3"  (1.905 m)  Wt 256 lb 12.8 oz (116.484 kg)  BMI 32.10 kg/m2 Gen: NAD, alert, cooperative with exam HEENT: NCAT Neuro: Alert and oriented, No gross deficits  Psych: Appropriate mood and affect, denies suicidal ideation  Assessment and plan:  # Generalized anxiety disorder, mood disorder Unclear of exact diagnosis Discussed with him my desire to minimize his benzodiazepine use, I have given him 3 months before I aggressively decrease his benzodiazepine use. I have explained that in the meantime we will start adjunctive medications that will hopefully take away the need of them. Continue Prozac Continue Xanax, still twice daily as needed, given #60 pills for this month. Start gabapentin, 300 twice a day I've encouraged him  to follow-up and to be persistent with finding a psychiatrist.     Meds ordered this encounter  Medications  . ALPRAZolam (XANAX) 0.5 MG tablet    Sig: Take 1 tablet (0.5 mg total) by mouth 2 (two) times daily.    Dispense:  60 tablet    Refill:  0  . gabapentin (NEURONTIN) 300 MG capsule    Sig: Take 1 capsule (300 mg total) by mouth 2 (two) times daily.    Dispense:  60 capsule    Refill:  [place], MD [facility] 10/06/2015, 8:43 AM

## 2015-10-06 NOTE — Patient Instructions (Signed)
Great to see you!  In th etime that I treat your anxiety I will try very hard to reduce the amount of xanax you need  Today we are adding gabapentin, try it first at night as it can cause sleepiness, but it is a twice daily medicine. We can increase the dose as well as decrease it.   Please come back in 3-4 weeks  Please call around and find a psychiatrist

## 2015-11-02 ENCOUNTER — Ambulatory Visit (INDEPENDENT_AMBULATORY_CARE_PROVIDER_SITE_OTHER): Payer: Medicaid Other | Admitting: Family Medicine

## 2015-11-02 ENCOUNTER — Ambulatory Visit: Payer: Medicaid Other | Admitting: Family Medicine

## 2015-11-02 ENCOUNTER — Encounter: Payer: Self-pay | Admitting: Family Medicine

## 2015-11-02 VITALS — BP 121/81 | HR 78 | Temp 97.3°F | Ht 75.0 in | Wt 270.2 lb

## 2015-11-02 DIAGNOSIS — E291 Testicular hypofunction: Secondary | ICD-10-CM

## 2015-11-02 DIAGNOSIS — F329 Major depressive disorder, single episode, unspecified: Secondary | ICD-10-CM

## 2015-11-02 DIAGNOSIS — F32A Depression, unspecified: Secondary | ICD-10-CM

## 2015-11-02 DIAGNOSIS — D751 Secondary polycythemia: Secondary | ICD-10-CM

## 2015-11-02 MED ORDER — LORAZEPAM 1 MG PO TABS: 1.0000 mg | ORAL_TABLET | Freq: Two times a day (BID) | ORAL | Status: DC | PRN

## 2015-11-02 MED ORDER — TESTOSTERONE CYPIONATE 200 MG/ML IM SOLN: 200.0000 mg | INTRAMUSCULAR | Status: DC

## 2015-11-02 MED ORDER — GABAPENTIN 300 MG PO CAPS: ORAL_CAPSULE | ORAL | Status: DC

## 2015-11-02 NOTE — Patient Instructions (Signed)
Great to see you!  I have changed the gabapentin for 3 pills during the day and 2 at night   I have given your old dose of ativan back  Lets follow up in 1 month

## 2015-11-02 NOTE — Progress Notes (Signed)
   HPI  Patient presents today here to discuss anxiety and testosterone deficiency.  Testosterone He's been out for about 3 weeks. He did not request a refill because he knew that he was coming in the top today. Sent him because of abnormalities in his labs and concern with polycythemia to urology. They agree with treatment, they recommend therapeutic phlebotomy as needed.  Anxiety He was recently released from his psychiatrist due to taking more than his prescribed benzodiazepines He was changed over to Xanax I saw him the following month then added gabapentin. He now complains that they next wears off too fast, however the gabapentin is helping. He would like to take gabapentin 4 times a day, he would like to continue consider a higher dose at bedtime. He's continued Prozac. No suicidal ideation. He is doing better in social situations than previously    PMH: Smoking status noted ROS: Per HPI  Objective: BP 121/81 mmHg  Pulse 78  Temp(Src) 97.3 F (36.3 C) (Oral)  Ht 6\' 3"  (1.905 m)  Wt 270 lb 3.2 oz (122.562 kg)  BMI 33.77 kg/m2 Gen: NAD, alert, cooperative with exam HEENT: NCAT CV: RRR, good S1/S2, no murmur Resp: CTABL, no wheezes, non-labored Ext: No edema, warm Neuro: Alert and oriented, No gross deficits Psych: Denies suicidal ideation, normal mood and affect  Assessment and plan:  # Anxiety Good Benefit from gabapentin, increase to 300 mg 3 times a day and 600 mg at night, total of 5 pills daily Still I'm recommending an encouraging him to see a psychiatrist, I plan to start weaning his benzodiazepines if he is unable to establish care within 3 months.  # Hypogonadism Refilled testosterone today, decreased energy since he has not had it for 3 weeks  #Secondary polycythemia CBC today Previous hemoglobin 17.9, would plan therapeutic phlebotomy over 18  Orders Placed This Encounter  Procedures  . CBC    Meds ordered this encounter  Medications  .  gabapentin (NEURONTIN) 300 MG capsule    Sig: 1 pill three times a day and 2 at night    Dispense:  150 capsule    Refill:  3  . LORazepam (ATIVAN) 1 MG tablet    Sig: Take 1 tablet (1 mg total) by mouth 2 (two) times daily as needed for anxiety.    Dispense:  60 tablet    Refill:  0  . testosterone cypionate (DEPOTESTOSTERONE CYPIONATE) 200 MG/ML injection    Sig: Inject 1 mL (200 mg total) into the muscle every 14 (fourteen) days.    Dispense:  10 mL    Refill:  [place], MD [place] 11/02/2015, 5:14 PM

## 2015-11-03 LAB — CBC
HEMATOCRIT: 52.1 % — AB (ref 37.5–51.0)
HEMOGLOBIN: 18.4 g/dL — AB (ref 12.6–17.7)
MCH: 30.3 pg (ref 26.6–33.0)
MCHC: 35.3 g/dL (ref 31.5–35.7)
MCV: 86 fL (ref 79–97)
Platelets: 290 10*3/uL (ref 150–379)
RBC: 6.07 x10E6/uL — AB (ref 4.14–5.80)
RDW: 14.5 % (ref 12.3–15.4)
WBC: 9.3 10*3/uL (ref 3.4–10.8)

## 2015-11-15 ENCOUNTER — Other Ambulatory Visit: Payer: Self-pay

## 2015-11-15 MED ORDER — TRIAMCINOLONE ACETONIDE 0.5 % EX OINT: 1.0000 "application " | TOPICAL_OINTMENT | Freq: Two times a day (BID) | CUTANEOUS | Status: DC

## 2015-11-15 NOTE — Telephone Encounter (Signed)
Last seen 11/02/15  Dr Wendi Snipes  This med not on EPIC list

## 2015-11-17 ENCOUNTER — Other Ambulatory Visit: Payer: Self-pay | Admitting: *Deleted

## 2015-11-17 DIAGNOSIS — D582 Other hemoglobinopathies: Secondary | ICD-10-CM

## 2015-11-30 ENCOUNTER — Ambulatory Visit (INDEPENDENT_AMBULATORY_CARE_PROVIDER_SITE_OTHER): Payer: Medicaid Other | Admitting: Family Medicine

## 2015-11-30 ENCOUNTER — Encounter: Payer: Self-pay | Admitting: Family Medicine

## 2015-11-30 VITALS — BP 139/86 | HR 93 | Temp 98.0°F | Ht 75.0 in | Wt 272.4 lb

## 2015-11-30 DIAGNOSIS — F419 Anxiety disorder, unspecified: Secondary | ICD-10-CM

## 2015-11-30 DIAGNOSIS — S00412A Abrasion of left ear, initial encounter: Secondary | ICD-10-CM | POA: Diagnosis not present

## 2015-11-30 DIAGNOSIS — D751 Secondary polycythemia: Secondary | ICD-10-CM | POA: Diagnosis not present

## 2015-11-30 MED ORDER — LORAZEPAM 1 MG PO TABS: 1.0000 mg | ORAL_TABLET | Freq: Two times a day (BID) | ORAL | Status: DC | PRN

## 2015-11-30 NOTE — Patient Instructions (Addendum)
Great to see you!  Try 2-3 drops of mineral oil every night for 1-2 weeks to help soothe your ear and soften the wax If it doesn't come out easily we can flush it after your ear canal heals

## 2015-11-30 NOTE — Progress Notes (Signed)
   HPI  Patient presents today to discuss anxiety and ear pain.  Anxiety Doing better with gabapentin, Paxil, and Ativan. Trying to resist increasing his dose of Ativan by adding gabapentin. He has seen improvement. He denies suicidal ideation.  Ear pain Left-sided ear pain and muffled hearing He states that he used a ear cleaning kit from [employer] last night and had sudden pain and onset of the muffled hearing.   PMH: Smoking status noted ROS: Per HPI  Objective: BP 139/86 mmHg  Pulse 93  Temp(Src) 98 F (36.7 C) (Oral)  Ht '6\' 3"'$  (1.905 m)  Wt 272 lb 6.4 oz (123.56 kg)  BMI 34.05 kg/m2 Gen: NAD, alert, cooperative with exam HEENT: NCAT, left-sided ear canal with blisterlike abrasion at 6:00, TM with slight erythema but also with thick yellow cerumen butting up against it, right TM partially obscured by cerumen but normal appearing Ext: No edema, warm Neuro: Alert and oriented, No gross deficits  Assessment and plan:  # Anxiety Refilled Ativan Continue gabapentin and Prozac He has appointment with psychiatry in 2 weeks.   # Ear pain, ear canal abrasion Recommended mineral oil drops and waiting for the earwax to clear, we did flush out the ear after the abrasion heals if necessary  # secondary polycythemia Due to testosterone replacement Arranging therapeutic phlebotomy to keep Hgb less than 16    Orders Placed This Encounter  Procedures  . Phlebotomy therapeutic    Standing Status: Future     Number of Occurrences:      Standing Expiration Date: 11/29/2016    Meds ordered this encounter  Medications  . LORazepam (ATIVAN) 1 MG tablet    Sig: Take 1 tablet (1 mg total) by mouth 2 (two) times daily as needed for anxiety.    Dispense:  60 tablet    Refill:  0    Laroy Apple, MD [facility] 11/30/2015, 5:16 PM

## 2015-12-01 ENCOUNTER — Other Ambulatory Visit: Payer: Self-pay | Admitting: *Deleted

## 2015-12-01 ENCOUNTER — Ambulatory Visit (HOSPITAL_COMMUNITY)
Admission: RE | Admit: 2015-12-01 | Discharge: 2015-12-01 | Disposition: A | Discharge status: Home / Self Care | Payer: Medicaid Other | Source: Ambulatory Visit | Attending: Family Medicine | Admitting: Family Medicine

## 2015-12-01 DIAGNOSIS — D751 Secondary polycythemia: Secondary | ICD-10-CM | POA: Insufficient documentation

## 2015-12-01 LAB — HEMOGLOBIN AND HEMATOCRIT, BLOOD
HEMATOCRIT: 52.8 % — AB (ref 39.0–52.0)
Hemoglobin: 18.9 g/dL — ABNORMAL HIGH (ref 13.0–17.0)

## 2015-12-01 NOTE — Progress Notes (Signed)
Results for SAVERIO, SORTO (MRN [id number]) as of 12/01/2015 13:25  Ref. Range 12/01/2015 13:04  Hemoglobin Latest Ref Range: 13.0-17.0 g/dL 18.9 (H)  HCT Latest Ref Range: 39.0-52.0 % 52.8 (H)

## 2015-12-01 NOTE — Addendum Note (Signed)
Addended by: Nigel Berthold C on: 12/01/2015 08:40 AM   Modules accepted: Orders

## 2015-12-01 NOTE — Progress Notes (Signed)
Paul Parrish presents today for phlebotomy per MD orders. HGB/HCT:18.9/52.8% Phlebotomy procedure started at 1312 and ended at 1321. 1 unit (22 oz.) removed. Patient tolerated procedure well. IV needle removed intact.

## 2015-12-31 ENCOUNTER — Encounter: Payer: Self-pay | Admitting: Family Medicine

## 2016-01-01 ENCOUNTER — Other Ambulatory Visit: Payer: Self-pay | Admitting: Family Medicine

## 2016-01-01 ENCOUNTER — Other Ambulatory Visit: Payer: Self-pay

## 2016-01-01 ENCOUNTER — Encounter (INDEPENDENT_AMBULATORY_CARE_PROVIDER_SITE_OTHER): Payer: Self-pay

## 2016-01-03 ENCOUNTER — Encounter: Payer: Self-pay | Admitting: Family Medicine

## 2016-01-03 ENCOUNTER — Ambulatory Visit (INDEPENDENT_AMBULATORY_CARE_PROVIDER_SITE_OTHER): Payer: Medicaid Other | Admitting: Family Medicine

## 2016-01-03 ENCOUNTER — Telehealth: Payer: Self-pay | Admitting: Family Medicine

## 2016-01-03 VITALS — BP 130/84 | HR 98 | Temp 97.7°F | Ht 75.0 in | Wt 270.0 lb

## 2016-01-03 DIAGNOSIS — J329 Chronic sinusitis, unspecified: Secondary | ICD-10-CM

## 2016-01-03 DIAGNOSIS — J40 Bronchitis, not specified as acute or chronic: Secondary | ICD-10-CM

## 2016-01-03 MED ORDER — GUAIFENESIN-CODEINE 100-10 MG/5ML PO SYRP: 5.0000 mL | ORAL_SOLUTION | ORAL | Status: DC | PRN

## 2016-01-03 MED ORDER — LEVOFLOXACIN 500 MG PO TABS: 500.0000 mg | ORAL_TABLET | Freq: Every day | ORAL | Status: DC

## 2016-01-03 MED ORDER — PSEUDOEPHEDRINE-GUAIFENESIN ER 60-600 MG PO TB12: 1.0000 | ORAL_TABLET | Freq: Two times a day (BID) | ORAL | Status: AC

## 2016-01-03 NOTE — Telephone Encounter (Signed)
Short Stay aware of labs to be drawn

## 2016-01-03 NOTE — Progress Notes (Signed)
Subjective:  Patient ID: Paul Parrish, male    DOB: 05-15-1971  Age: 45 y.o. MRN: [id number]  CC: URI   HPI Paul Parrish presents for chest congestion, cough, chest is sore from the cough x 2 wks  History Paul Parrish has a past medical history of Depression; Anxiety; Elevated hemoglobin ([place]); and Low testosterone.   He has no past surgical history on file.   His family history includes Cancer in his mother and paternal grandmother; Diabetes in his brother and father; Hypertension in his father.He reports that he has never smoked. He has never used smokeless tobacco. He reports that he drinks alcohol. He reports that he does not use illicit drugs.    ROS Review of Systems  Constitutional: Negative for fever, chills, activity change and appetite change.  HENT: Positive for congestion, postnasal drip, rhinorrhea and sinus pressure. Negative for ear discharge, ear pain, hearing loss, nosebleeds, sneezing and trouble swallowing.   Respiratory: Negative for chest tightness and shortness of breath.   Cardiovascular: Negative for chest pain and palpitations.  Skin: Negative for rash.    Objective:  BP 130/84 mmHg  Pulse 98  Temp(Src) 97.7 F (36.5 C) (Oral)  Ht 6\' 3"  (1.905 m)  Wt 270 lb (122.471 kg)  BMI 33.75 kg/m2  SpO2 97%  BP Readings from Last 3 Encounters:  01/03/16 130/84  12/01/15 145/91  11/30/15 139/86    Wt Readings from Last 3 Encounters:  01/03/16 270 lb (122.471 kg)  12/01/15 272 lb (123.378 kg)  11/30/15 272 lb 6.4 oz (123.56 kg)     Physical Exam  Constitutional: He appears well-developed and well-nourished.  HENT:  Head: Normocephalic and atraumatic.  Right Ear: Tympanic membrane and external ear normal. No decreased hearing is noted.  Left Ear: Tympanic membrane and external ear normal. No decreased hearing is noted.  Nose: Mucosal edema present. Right sinus exhibits no frontal sinus tenderness. Left sinus exhibits no frontal sinus tenderness.    Mouth/Throat: No oropharyngeal exudate or posterior oropharyngeal erythema.  Neck: No Brudzinski's sign noted.  Pulmonary/Chest: Breath sounds normal. No respiratory distress.  Lymphadenopathy:       Head (right side): No preauricular adenopathy present.       Head (left side): No preauricular adenopathy present.       Right cervical: No superficial cervical adenopathy present.      Left cervical: No superficial cervical adenopathy present.     Lab Results  Component Value Date   WBC 9.3 11/02/2015   HGB 18.9* 12/01/2015   HCT 52.8* 12/01/2015   PLT 290 11/02/2015   GLUCOSE 87 05/02/2015   CHOL 212* 05/18/2014   TRIG 361* 05/18/2014   HDL 29* 05/18/2014   LDLCALC 111* 05/18/2014   ALT 37 05/02/2015   AST 24 05/02/2015   NA 140 05/02/2015   K 4.1 05/02/2015   CL 98 05/02/2015   CREATININE 1.01 05/02/2015   BUN 9 05/02/2015   CO2 23 05/02/2015   TSH 2.520 05/02/2015   PSA 1.1 01/31/2014   HGBA1C 4.9% 08/13/2013    No results found.  Assessment & Plan:   Paul Parrish was seen today for uri.  Diagnoses and all orders for this visit:  Sinobronchitis  Other orders -     levofloxacin (LEVAQUIN) 500 MG tablet; Take 1 tablet (500 mg total) by mouth daily. -     pseudoephedrine-guaifenesin (MUCINEX D) 60-600 MG 12 hr tablet; Take 1 tablet by mouth every 12 (twelve) hours. As needed for congestion -  guaiFENesin-codeine (CHERATUSSIN AC) 100-10 MG/5ML syrup; Take 5 mLs by mouth every 4 (four) hours as needed for cough.    I am having Paul Parrish start on levofloxacin, pseudoephedrine-guaifenesin, and guaiFENesin-codeine. I am also having him maintain his gabapentin, testosterone cypionate, triamcinolone ointment, FLUoxetine, FLUoxetine, LORazepam, sildenafil, and clotrimazole-betamethasone.  Meds ordered this encounter  Medications  . FLUoxetine (PROZAC) 40 MG capsule    Sig: Take 1 capsule by mouth daily.    Refill:  3  . FLUoxetine (PROZAC) 20 MG capsule    Sig: Take 1  capsule by mouth daily.    Refill:  0  . LORazepam (ATIVAN) 1 MG tablet    Sig: Take 1 tablet by mouth 3 (three) times daily as needed.    Refill:  0  . sildenafil (REVATIO) 20 MG tablet    Sig: TAKE 2-4 PILLS ONCE DAILY AS NEEDED    Refill:  5  . clotrimazole-betamethasone (LOTRISONE) cream    Sig: APPLY AND RUB IN A THIN FILM TO AFFECTED AREAS TWICE DAILY IN THE MORNING AND EVENING    Refill:  2  . levofloxacin (LEVAQUIN) 500 MG tablet    Sig: Take 1 tablet (500 mg total) by mouth daily.    Dispense:  7 tablet    Refill:  0  . pseudoephedrine-guaifenesin (MUCINEX D) 60-600 MG 12 hr tablet    Sig: Take 1 tablet by mouth every 12 (twelve) hours. As needed for congestion    Dispense:  20 tablet    Refill:  0  . guaiFENesin-codeine (CHERATUSSIN AC) 100-10 MG/5ML syrup    Sig: Take 5 mLs by mouth every 4 (four) hours as needed for cough.    Dispense:  180 mL    Refill:  0     Follow-up: No Follow-up on file.  Claretta Fraise, M.D.

## 2016-01-04 ENCOUNTER — Encounter (HOSPITAL_COMMUNITY)
Admission: RE | Admit: 2016-01-04 | Discharge: 2016-01-04 | Disposition: A | Discharge status: Home / Self Care | Payer: Medicaid Other | Source: Ambulatory Visit | Attending: Family Medicine | Admitting: Family Medicine

## 2016-01-04 ENCOUNTER — Other Ambulatory Visit (HOSPITAL_COMMUNITY): Payer: Medicaid Other

## 2016-01-17 ENCOUNTER — Encounter (HOSPITAL_COMMUNITY): Admission: RE | Admit: 2016-01-17 | Payer: Medicaid Other | Source: Ambulatory Visit

## 2016-01-18 ENCOUNTER — Encounter: Payer: Self-pay | Admitting: Nurse Practitioner

## 2016-01-18 ENCOUNTER — Ambulatory Visit (INDEPENDENT_AMBULATORY_CARE_PROVIDER_SITE_OTHER): Payer: Medicaid Other | Admitting: Nurse Practitioner

## 2016-01-18 VITALS — BP 136/90 | HR 101 | Temp 97.9°F | Ht 75.0 in | Wt 269.0 lb

## 2016-01-18 DIAGNOSIS — J0101 Acute recurrent maxillary sinusitis: Secondary | ICD-10-CM | POA: Diagnosis not present

## 2016-01-18 MED ORDER — AZITHROMYCIN 250 MG PO TABS: 250.0000 mg | ORAL_TABLET | Freq: Every day | ORAL | Status: DC

## 2016-01-18 MED ORDER — GUAIFENESIN-CODEINE 100-10 MG/5ML PO SYRP: 5.0000 mL | ORAL_SOLUTION | ORAL | Status: DC | PRN

## 2016-01-18 NOTE — Patient Instructions (Signed)

## 2016-01-18 NOTE — Progress Notes (Addendum)
  Subjective:     Paul Parrish is a 45 y.o. male who presents for evaluation of sore throat. Associated symptoms include dry cough, headache, nasal blockage, post nasal drip, sinus and nasal congestion and sore throat. Onset of symptoms was 3 days ago, and have been unchanged since that time. He is drinking plenty of fluids. He has not had a recent close exposure to someone with proven streptococcal pharyngitis. * had same symptoms 2 weeks ago and was given antibiotic and tussionex- got some better but now is worse. The following portions of the patient's history were reviewed and updated as appropriate: allergies, current medications, past family history, past medical history, past social history, past surgical history and problem list.  Review of Systems Pertinent items are noted in HPI.    Objective:    BP 136/90 mmHg  Pulse 101  Temp(Src) 97.9 F (36.6 C) (Oral)  Ht 6\' 3"  (1.905 m)  Wt 269 lb (122.018 kg)  BMI 33.62 kg/m2 General appearance: alert and cooperative Eyes: conjunctivae/corneas clear. PERRL, EOM's intact. Fundi benign. Ears: normal TM's and external ear canals both ears Nose: clear discharge, severe congestion, turbinates red, sinus tenderness bilateral Throat: lips, mucosa, and tongue normal; teeth and gums normal Lungs: clear to auscultation bilaterally Heart: regular rate and rhythm, S1, S2 normal, no murmur, click, rub or gallop  Laboratory Strep test not done. Results    Assessment:     unresolved sinusitis.    Plan:   1. Take meds as prescribed 2. Use a cool mist humidifier especially during the winter months and when heat has been humid. 3. Use saline nose sprays frequently 4. Saline irrigations of the nose can be very helpful if done frequently.  * 4X daily for 1 week*  * Use of a nettie pot can be helpful with this. Follow directions with this* 5. Drink plenty of fluids 6. Keep thermostat turn down low 7.For any cough or congestion  Use plain  Mucinex- regular strength or max strength is fine   * Children- consult with Pharmacist for dosing 8. For fever or aces or pains- take tylenol or ibuprofen appropriate for age and weight.  * for fevers greater than 101 orally you may alternate ibuprofen and tylenol every  3 hours.     Meds ordered this encounter  Medications  . azithromycin (ZITHROMAX) 250 MG tablet    Sig: Take 1 tablet (250 mg total) by mouth daily.    Dispense:  6 each    Refill:  0    Order Specific Question:  Supervising Provider    Answer:  Chipper Herb [[id number]]  . guaiFENesin-codeine (CHERATUSSIN AC) 100-10 MG/5ML syrup    Sig: Take 5 mLs by mouth every 4 (four) hours as needed for cough.    Dispense:  180 mL    Refill:  0    Order Specific Question:  Supervising Provider    Answer:  Chipper Herb [id number]    Mary-Margaret Hassell Done, FNP

## 2016-01-18 NOTE — Addendum Note (Signed)
Addended by: Chevis Pretty on: 01/18/2016 03:37 PM   Modules accepted: Orders, SmartSet

## 2016-01-23 ENCOUNTER — Encounter: Payer: Self-pay | Admitting: Family

## 2016-01-23 ENCOUNTER — Ambulatory Visit (INDEPENDENT_AMBULATORY_CARE_PROVIDER_SITE_OTHER): Payer: Medicaid Other | Admitting: Family

## 2016-01-23 VITALS — BP 130/93 | HR 94 | Temp 97.4°F | Ht 75.0 in | Wt 269.8 lb

## 2016-01-23 DIAGNOSIS — J329 Chronic sinusitis, unspecified: Secondary | ICD-10-CM

## 2016-01-23 DIAGNOSIS — J309 Allergic rhinitis, unspecified: Secondary | ICD-10-CM

## 2016-01-23 MED ORDER — MONTELUKAST SODIUM 10 MG PO TABS: 10.0000 mg | ORAL_TABLET | Freq: Every day | ORAL | Status: DC

## 2016-01-23 MED ORDER — DOXYCYCLINE HYCLATE 100 MG PO TABS
100.0000 mg | ORAL_TABLET | Freq: Two times a day (BID) | ORAL | Status: DC
Start: 2016-01-23 — End: 2016-10-21

## 2016-01-23 MED ORDER — HYDROCODONE-HOMATROPINE 5-1.5 MG/5ML PO SYRP: 5.0000 mL | ORAL_SOLUTION | Freq: Three times a day (TID) | ORAL | Status: DC | PRN

## 2016-01-23 MED ORDER — FLUTICASONE PROPIONATE 50 MCG/ACT NA SUSP
2.0000 | Freq: Every day | NASAL | Status: DC
Start: 2016-01-23 — End: 2016-10-21

## 2016-01-23 NOTE — Progress Notes (Signed)
Subjective:    Patient ID: Paul Parrish, male    DOB: [birth year], 45 y.o.   MRN: [id number]  Pt presents to the office today with recurrent sinus problems. PT was seen in the office on 01/03/16 and started on Levaquin which patient states helped, but then reoccurred. PT was then seen on 01/18/16 and given Zpak and Robitussin AC. PT states he continues to have nonproductive cough and sinus headache.  Sinus Problem This is a recurrent problem. The current episode started 1 to 4 weeks ago. The problem has been waxing and waning since onset. There has been no fever. His pain is at a severity of 8/10. Associated symptoms include congestion, coughing, headaches, a hoarse voice and sinus pressure. Pertinent negatives include no chills, ear pain, shortness of breath, sneezing, sore throat or swollen glands. Past treatments include oral decongestants and antibiotics. The treatment provided mild relief.  Cough Associated symptoms include headaches. Pertinent negatives include no chills, ear pain, sore throat or shortness of breath.  Headache  Associated symptoms include coughing and sinus pressure. Pertinent negatives include no ear pain, sore throat or swollen glands.      Review of Systems  Constitutional: Negative for chills.  HENT: Positive for congestion, hoarse voice and sinus pressure. Negative for ear pain, sneezing and sore throat.   Respiratory: Positive for cough. Negative for shortness of breath.   Neurological: Positive for headaches.  All other systems reviewed and are negative.      Objective:   Physical Exam  Constitutional: He is oriented to person, place, and time. He appears well-developed and well-nourished. No distress.  HENT:  Head: Normocephalic.  Right Ear: External ear normal. Tympanic membrane is erythematous.  Left Ear: External ear normal.  Nose: Mucosal edema and rhinorrhea present. Right sinus exhibits maxillary sinus tenderness and frontal sinus tenderness. Left  sinus exhibits maxillary sinus tenderness and frontal sinus tenderness.  Mouth/Throat: Oropharynx is clear and moist.  Nasal passage erythemas with mild swelling    Eyes: Pupils are equal, round, and reactive to light. Right eye exhibits no discharge. Left eye exhibits no discharge.  Neck: Normal range of motion. Neck supple. No thyromegaly present.  Cardiovascular: Normal rate, regular rhythm, normal heart sounds and intact distal pulses.   No murmur heard. Pulmonary/Chest: Effort normal and breath sounds normal. No respiratory distress. He has no wheezes.  Abdominal: Soft. Bowel sounds are normal. He exhibits no distension. There is no tenderness.  Musculoskeletal: Normal range of motion. He exhibits no edema or tenderness.  Neurological: He is alert and oriented to person, place, and time. He has normal reflexes. No cranial nerve deficit.  Skin: Skin is warm and dry. No rash noted. No erythema.  Psychiatric: He has a normal mood and affect. His behavior is normal. Judgment and thought content normal.  Vitals reviewed.     BP 130/93 mmHg  Pulse 94  Temp(Src) 97.4 F (36.3 C) (Oral)  Ht 6\' 3"  (1.905 m)  Wt 269 lb 12.8 oz (122.38 kg)  BMI 33.72 kg/m2     Assessment & Plan:  1. Recurrent sinusitis -- Take meds as prescribed - Use a cool mist humidifier  -Use saline nose sprays frequently -Saline irrigations of the nose can be very helpful if done frequently.  * 4X daily for 1 week*  * Use of a nettie pot can be helpful with this. Follow directions with this* -Force fluids -For any cough or congestion  Use plain Mucinex- regular strength or max strength is fine   *  Children- consult with Pharmacist for dosing -For fever or aces or pains- take tylenol or ibuprofen appropriate for age and weight.  * for fevers greater than 101 orally you may alternate ibuprofen and tylenol every  3 hours. -Throat lozenges if help - doxycycline (VIBRA-TABS) 100 MG tablet; Take 1 tablet (100 mg  total) by mouth 2 (two) times daily.  Dispense: 20 tablet; Refill: 0 - Ambulatory referral to ENT - fluticasone (FLONASE) 50 MCG/ACT nasal spray; Place 2 sprays into both nostrils daily.  Dispense: 16 g; Refill: 6 - montelukast (SINGULAIR) 10 MG tablet; Take 1 tablet (10 mg total) by mouth at bedtime.  Dispense: 30 tablet; Refill: 3  2. Allergic rhinitis, unspecified allergic rhinitis type -Avoid allergens when possible - fluticasone (FLONASE) 50 MCG/ACT nasal spray; Place 2 sprays into both nostrils daily.  Dispense: 16 g; Refill: 6 - montelukast (SINGULAIR) 10 MG tablet; Take 1 tablet (10 mg total) by mouth at bedtime.  Dispense: 30 tablet; Refill: [place], FNP

## 2016-01-23 NOTE — Addendum Note (Signed)
Addended by: Evelina Dun A on: 01/23/2016 03:10 PM   Modules accepted: Orders

## 2016-01-23 NOTE — Patient Instructions (Signed)
Sinusitis, Adult Sinusitis is redness, soreness, and inflammation of the paranasal sinuses. Paranasal sinuses are air pockets within the bones of your face. They are located beneath your eyes, in the middle of your forehead, and above your eyes. In healthy paranasal sinuses, mucus is able to drain out, and air is able to circulate through them by way of your nose. However, when your paranasal sinuses are inflamed, mucus and air can become trapped. This can allow bacteria and other germs to grow and cause infection. Sinusitis can develop quickly and last only a short time (acute) or continue over a long period (chronic). Sinusitis that lasts for more than 12 weeks is considered chronic. CAUSES Causes of sinusitis include:  Allergies.  Structural abnormalities, such as displacement of the cartilage that separates your nostrils (deviated septum), which can decrease the air flow through your nose and sinuses and affect sinus drainage.  Functional abnormalities, such as when the small hairs (cilia) that line your sinuses and help remove mucus do not work properly or are not present. SIGNS AND SYMPTOMS Symptoms of acute and chronic sinusitis are the same. The primary symptoms are pain and pressure around the affected sinuses. Other symptoms include:  Upper toothache.  Earache.  Headache.  Bad breath.  Decreased sense of smell and taste.  A cough, which worsens when you are lying flat.  Fatigue.  Fever.  Thick drainage from your nose, which often is green and may contain pus (purulent).  Swelling and warmth over the affected sinuses. DIAGNOSIS Your health care provider will perform a physical exam. During your exam, your health care provider may perform any of the following to help determine if you have acute sinusitis or chronic sinusitis:  Look in your nose for signs of abnormal growths in your nostrils (nasal polyps).  Tap over the affected sinus to check for signs of  infection.  View the inside of your sinuses using an imaging device that has a light attached (endoscope). If your health care provider suspects that you have chronic sinusitis, one or more of the following tests may be recommended:  Allergy tests.  Nasal culture. A sample of mucus is taken from your nose, sent to a lab, and screened for bacteria.  Nasal cytology. A sample of mucus is taken from your nose and examined by your health care provider to determine if your sinusitis is related to an allergy. TREATMENT Most cases of acute sinusitis are related to a viral infection and will resolve on their own within 10 days. Sometimes, medicines are prescribed to help relieve symptoms of both acute and chronic sinusitis. These may include pain medicines, decongestants, nasal steroid sprays, or saline sprays. However, for sinusitis related to a bacterial infection, your health care provider will prescribe antibiotic medicines. These are medicines that will help kill the bacteria causing the infection. Rarely, sinusitis is caused by a fungal infection. In these cases, your health care provider will prescribe antifungal medicine. For some cases of chronic sinusitis, surgery is needed. Generally, these are cases in which sinusitis recurs more than 3 times per year, despite other treatments. HOME CARE INSTRUCTIONS  Drink plenty of water. Water helps thin the mucus so your sinuses can drain more easily.  Use a humidifier.  Inhale steam 3-4 times a day (for example, sit in the bathroom with the shower running).  Apply a warm, moist washcloth to your face 3-4 times a day, or as directed by your health care provider.  Use saline nasal sprays to help   moisten and clean your sinuses.  Take medicines only as directed by your health care provider.  If you were prescribed either an antibiotic or antifungal medicine, finish it all even if you start to feel better. SEEK IMMEDIATE MEDICAL CARE IF:  You have  increasing pain or severe headaches.  You have nausea, vomiting, or drowsiness.  You have swelling around your face.  You have vision problems.  You have a stiff neck.  You have difficulty breathing.   This information is not intended to replace advice given to you by your health care provider. Make sure you discuss any questions you have with your health care provider.   Document Released: 09/16/2005 Document Revised: 10/07/2014 Document Reviewed: 10/01/2011 Elsevier Interactive Patient Education 2016 [employer]  - Take meds as prescribed - Use a cool mist humidifier  -Use saline nose sprays frequently -Saline irrigations of the nose can be very helpful if done frequently.  * 4X daily for 1 week*  * Use of a nettie pot can be helpful with this. Follow directions with this* -Force fluids -For any cough or congestion  Use plain Mucinex- regular strength or max strength is fine   * Children- consult with Pharmacist for dosing -For fever or aces or pains- take tylenol or ibuprofen appropriate for age and weight.  * for fevers greater than 101 orally you may alternate ibuprofen and tylenol every  3 hours. -Throat lozenges if help -New toothbrush in 3 days   Wylee Ogden, FNP   

## 2016-02-11 ENCOUNTER — Encounter: Payer: Self-pay | Admitting: Family Medicine

## 2016-02-12 ENCOUNTER — Other Ambulatory Visit: Payer: Self-pay | Admitting: Family Medicine

## 2016-02-12 DIAGNOSIS — L409 Psoriasis, unspecified: Secondary | ICD-10-CM | POA: Insufficient documentation

## 2016-02-20 ENCOUNTER — Other Ambulatory Visit: Payer: Self-pay | Admitting: Family Medicine

## 2016-02-29 HISTORY — PX: NASAL SINUS SURGERY: SHX719

## 2016-05-03 ENCOUNTER — Other Ambulatory Visit: Payer: Self-pay

## 2016-05-03 DIAGNOSIS — F32A Depression, unspecified: Secondary | ICD-10-CM

## 2016-05-03 DIAGNOSIS — E291 Testicular hypofunction: Secondary | ICD-10-CM

## 2016-05-03 DIAGNOSIS — F329 Major depressive disorder, single episode, unspecified: Secondary | ICD-10-CM

## 2016-05-03 DIAGNOSIS — D751 Secondary polycythemia: Secondary | ICD-10-CM

## 2016-05-03 MED ORDER — TESTOSTERONE CYPIONATE 200 MG/ML IM SOLN: 200.0000 mg | INTRAMUSCULAR | 1 refills | Status: DC

## 2016-05-23 ENCOUNTER — Other Ambulatory Visit: Payer: Self-pay | Admitting: Family

## 2016-05-23 DIAGNOSIS — J329 Chronic sinusitis, unspecified: Secondary | ICD-10-CM

## 2016-05-23 DIAGNOSIS — J309 Allergic rhinitis, unspecified: Secondary | ICD-10-CM

## 2016-05-24 ENCOUNTER — Telehealth: Payer: Self-pay | Admitting: Family Medicine

## 2016-05-24 NOTE — Telephone Encounter (Signed)
Advised pharmacy that pt was given Sildenafil due to erectile dysfunction.

## 2016-06-13 ENCOUNTER — Encounter: Payer: Self-pay | Admitting: Family Medicine

## 2016-06-13 DIAGNOSIS — R030 Elevated blood-pressure reading, without diagnosis of hypertension: Secondary | ICD-10-CM

## 2016-06-13 DIAGNOSIS — E291 Testicular hypofunction: Secondary | ICD-10-CM

## 2016-06-13 DIAGNOSIS — D751 Secondary polycythemia: Secondary | ICD-10-CM

## 2016-06-13 MED ORDER — SILDENAFIL CITRATE 20 MG PO TABS: ORAL_TABLET | ORAL | 5 refills | Status: DC

## 2016-06-15 ENCOUNTER — Other Ambulatory Visit: Payer: Medicaid Other

## 2016-06-15 DIAGNOSIS — D751 Secondary polycythemia: Secondary | ICD-10-CM

## 2016-06-15 DIAGNOSIS — R030 Elevated blood-pressure reading, without diagnosis of hypertension: Secondary | ICD-10-CM

## 2016-06-15 DIAGNOSIS — E291 Testicular hypofunction: Secondary | ICD-10-CM

## 2016-06-16 LAB — CBC WITH DIFFERENTIAL/PLATELET
BASOS: 1 %
Basophils Absolute: 0 10*3/uL (ref 0.0–0.2)
EOS (ABSOLUTE): 0.1 10*3/uL (ref 0.0–0.4)
Eos: 1 %
Hematocrit: 56.6 % — ABNORMAL HIGH (ref 37.5–51.0)
Hemoglobin: 19.1 g/dL — ABNORMAL HIGH (ref 12.6–17.7)
IMMATURE GRANULOCYTES: 0 %
Immature Grans (Abs): 0 10*3/uL (ref 0.0–0.1)
Lymphocytes Absolute: 2 10*3/uL (ref 0.7–3.1)
Lymphs: 30 %
MCH: 30.8 pg (ref 26.6–33.0)
MCHC: 33.7 g/dL (ref 31.5–35.7)
MCV: 91 fL (ref 79–97)
MONOS ABS: 0.8 10*3/uL (ref 0.1–0.9)
Monocytes: 12 %
NEUTROS PCT: 56 %
Neutrophils Absolute: 3.7 10*3/uL (ref 1.4–7.0)
PLATELETS: 248 10*3/uL (ref 150–379)
RBC: 6.21 x10E6/uL — ABNORMAL HIGH (ref 4.14–5.80)
RDW: 15 % (ref 12.3–15.4)
WBC: 6.6 10*3/uL (ref 3.4–10.8)

## 2016-06-16 LAB — CMP14+EGFR
A/G RATIO: 1.5 (ref 1.2–2.2)
ALBUMIN: 4.3 g/dL (ref 3.5–5.5)
ALK PHOS: 78 IU/L (ref 39–117)
ALT: 28 IU/L (ref 0–44)
AST: 20 IU/L (ref 0–40)
BILIRUBIN TOTAL: 0.7 mg/dL (ref 0.0–1.2)
BUN / CREAT RATIO: 6 — AB (ref 9–20)
BUN: 7 mg/dL (ref 6–24)
CHLORIDE: 96 mmol/L (ref 96–106)
CO2: 24 mmol/L (ref 18–29)
Calcium: 9 mg/dL (ref 8.7–10.2)
Creatinine, Ser: 1.14 mg/dL (ref 0.76–1.27)
GFR calc non Af Amer: 77 mL/min/{1.73_m2} (ref >59)
GFR, EST AFRICAN AMERICAN: 89 mL/min/{1.73_m2} (ref >59)
Globulin, Total: 2.9 g/dL (ref 1.5–4.5)
Glucose: 83 mg/dL (ref 65–99)
POTASSIUM: 4.6 mmol/L (ref 3.5–5.2)
Sodium: 137 mmol/L (ref 134–144)
TOTAL PROTEIN: 7.2 g/dL (ref 6.0–8.5)

## 2016-06-16 LAB — LIPID PANEL
Chol/HDL Ratio: 8.8 ratio units — ABNORMAL HIGH (ref 0.0–5.0)
Cholesterol, Total: 221 mg/dL — ABNORMAL HIGH (ref 100–199)
HDL: 25 mg/dL — ABNORMAL LOW (ref >39)
LDL Calculated: 118 mg/dL — ABNORMAL HIGH (ref 0–99)
Triglycerides: 390 mg/dL — ABNORMAL HIGH (ref 0–149)
VLDL Cholesterol Cal: 78 mg/dL — ABNORMAL HIGH (ref 5–40)

## 2016-06-16 LAB — TESTOSTERONE: Testosterone: 418 ng/dL (ref 264–916)

## 2016-07-26 ENCOUNTER — Other Ambulatory Visit: Payer: Self-pay | Admitting: Family Medicine

## 2016-07-26 ENCOUNTER — Encounter: Payer: Self-pay | Admitting: Family Medicine

## 2016-07-26 DIAGNOSIS — D751 Secondary polycythemia: Secondary | ICD-10-CM

## 2016-08-05 ENCOUNTER — Encounter: Payer: Self-pay | Admitting: Family Medicine

## 2016-09-24 ENCOUNTER — Encounter: Payer: Self-pay | Admitting: Family Medicine

## 2016-10-04 ENCOUNTER — Other Ambulatory Visit: Payer: Self-pay | Admitting: *Deleted

## 2016-10-04 MED ORDER — SILDENAFIL CITRATE 20 MG PO TABS: ORAL_TABLET | ORAL | 1 refills | Status: DC

## 2016-10-09 ENCOUNTER — Encounter: Payer: Self-pay | Admitting: Family Medicine

## 2016-10-11 DIAGNOSIS — R5382 Chronic fatigue, unspecified: Secondary | ICD-10-CM | POA: Diagnosis not present

## 2016-10-18 ENCOUNTER — Encounter: Payer: Self-pay | Admitting: Family Medicine

## 2016-10-21 ENCOUNTER — Inpatient Hospital Stay (HOSPITAL_BASED_OUTPATIENT_CLINIC_OR_DEPARTMENT_OTHER)
Admission: EM | Admit: 2016-10-21 | Discharge: 2016-10-23 | DRG: 389 | Disposition: A | Discharge status: Home / Self Care | Payer: 59 | Attending: Internal Medicine | Admitting: Internal Medicine

## 2016-10-21 ENCOUNTER — Observation Stay (HOSPITAL_BASED_OUTPATIENT_CLINIC_OR_DEPARTMENT_OTHER): Payer: 59

## 2016-10-21 ENCOUNTER — Emergency Department (HOSPITAL_BASED_OUTPATIENT_CLINIC_OR_DEPARTMENT_OTHER): Payer: 59

## 2016-10-21 ENCOUNTER — Encounter (HOSPITAL_BASED_OUTPATIENT_CLINIC_OR_DEPARTMENT_OTHER): Payer: Self-pay | Admitting: Emergency Medicine

## 2016-10-21 ENCOUNTER — Observation Stay (HOSPITAL_COMMUNITY): Payer: 59

## 2016-10-21 DIAGNOSIS — Z4682 Encounter for fitting and adjustment of non-vascular catheter: Secondary | ICD-10-CM | POA: Diagnosis not present

## 2016-10-21 DIAGNOSIS — Z7951 Long term (current) use of inhaled steroids: Secondary | ICD-10-CM

## 2016-10-21 DIAGNOSIS — K56609 Unspecified intestinal obstruction, unspecified as to partial versus complete obstruction: Secondary | ICD-10-CM | POA: Diagnosis not present

## 2016-10-21 DIAGNOSIS — E291 Testicular hypofunction: Secondary | ICD-10-CM | POA: Diagnosis present

## 2016-10-21 DIAGNOSIS — F329 Major depressive disorder, single episode, unspecified: Secondary | ICD-10-CM | POA: Diagnosis present

## 2016-10-21 DIAGNOSIS — F419 Anxiety disorder, unspecified: Secondary | ICD-10-CM | POA: Diagnosis present

## 2016-10-21 DIAGNOSIS — Z79899 Other long term (current) drug therapy: Secondary | ICD-10-CM

## 2016-10-21 DIAGNOSIS — R109 Unspecified abdominal pain: Secondary | ICD-10-CM | POA: Diagnosis not present

## 2016-10-21 DIAGNOSIS — E871 Hypo-osmolality and hyponatremia: Secondary | ICD-10-CM | POA: Diagnosis present

## 2016-10-21 DIAGNOSIS — R1084 Generalized abdominal pain: Secondary | ICD-10-CM | POA: Diagnosis not present

## 2016-10-21 DIAGNOSIS — D751 Secondary polycythemia: Secondary | ICD-10-CM | POA: Diagnosis present

## 2016-10-21 DIAGNOSIS — F32A Depression, unspecified: Secondary | ICD-10-CM | POA: Diagnosis present

## 2016-10-21 DIAGNOSIS — K5669 Other partial intestinal obstruction: Secondary | ICD-10-CM | POA: Diagnosis not present

## 2016-10-21 DIAGNOSIS — L409 Psoriasis, unspecified: Secondary | ICD-10-CM | POA: Diagnosis present

## 2016-10-21 DIAGNOSIS — K56699 Other intestinal obstruction unspecified as to partial versus complete obstruction: Secondary | ICD-10-CM | POA: Diagnosis not present

## 2016-10-21 DIAGNOSIS — R5382 Chronic fatigue, unspecified: Secondary | ICD-10-CM | POA: Diagnosis present

## 2016-10-21 HISTORY — DX: Unspecified intestinal obstruction, unspecified as to partial versus complete obstruction: K56.609

## 2016-10-21 LAB — COMPREHENSIVE METABOLIC PANEL
ALBUMIN: 4.4 g/dL (ref 3.5–5.0)
ALT: 31 U/L (ref 17–63)
ANION GAP: 10 (ref 5–15)
AST: 28 U/L (ref 15–41)
Alkaline Phosphatase: 75 U/L (ref 38–126)
BILIRUBIN TOTAL: 0.8 mg/dL (ref 0.3–1.2)
BUN: 11 mg/dL (ref 6–20)
CO2: 25 mmol/L (ref 22–32)
Calcium: 9.4 mg/dL (ref 8.9–10.3)
Chloride: 99 mmol/L — ABNORMAL LOW (ref 101–111)
Creatinine, Ser: 1.22 mg/dL (ref 0.61–1.24)
GFR calc non Af Amer: >60 mL/min (ref >60)
Glucose, Bld: 120 mg/dL — ABNORMAL HIGH (ref 65–99)
POTASSIUM: 4.1 mmol/L (ref 3.5–5.1)
Sodium: 134 mmol/L — ABNORMAL LOW (ref 135–145)
TOTAL PROTEIN: 7.8 g/dL (ref 6.5–8.1)

## 2016-10-21 LAB — CBC WITH DIFFERENTIAL/PLATELET
BASOS PCT: 0 %
Basophils Absolute: 0 10*3/uL (ref 0.0–0.1)
Eosinophils Absolute: 0.1 10*3/uL (ref 0.0–0.7)
Eosinophils Relative: 0 %
HEMATOCRIT: 54.2 % — AB (ref 39.0–52.0)
Hemoglobin: 19.3 g/dL — ABNORMAL HIGH (ref 13.0–17.0)
Lymphocytes Relative: 14 %
Lymphs Abs: 2 10*3/uL (ref 0.7–4.0)
MCH: 30 pg (ref 26.0–34.0)
MCHC: 35.6 g/dL (ref 30.0–36.0)
MCV: 84.2 fL (ref 78.0–100.0)
MONO ABS: 1.3 10*3/uL — AB (ref 0.1–1.0)
MONOS PCT: 9 %
NEUTROS ABS: 11.4 10*3/uL — AB (ref 1.7–7.7)
Neutrophils Relative %: 77 %
Platelets: 315 10*3/uL (ref 150–400)
RBC: 6.44 MIL/uL — ABNORMAL HIGH (ref 4.22–5.81)
RDW: 13.6 % (ref 11.5–15.5)
WBC: 14.8 10*3/uL — ABNORMAL HIGH (ref 4.0–10.5)

## 2016-10-21 LAB — URINALYSIS, ROUTINE W REFLEX MICROSCOPIC
Bilirubin Urine: NEGATIVE
Glucose, UA: NEGATIVE mg/dL
Hgb urine dipstick: NEGATIVE
KETONES UR: NEGATIVE mg/dL
LEUKOCYTES UA: NEGATIVE
NITRITE: NEGATIVE
PROTEIN: NEGATIVE mg/dL
Specific Gravity, Urine: 1.039 — ABNORMAL HIGH (ref 1.005–1.030)
pH: 8 (ref 5.0–8.0)

## 2016-10-21 LAB — LIPASE, BLOOD: LIPASE: 23 U/L (ref 11–51)

## 2016-10-21 MED ORDER — SODIUM CHLORIDE 0.9 % IV BOLUS (SEPSIS)
1000.0000 mL | Freq: Once | INTRAVENOUS | Status: AC
  Administered 2016-10-21: 1000 mL via INTRAVENOUS

## 2016-10-21 MED ORDER — ONDANSETRON HCL 4 MG/2ML IJ SOLN
4.0000 mg | Freq: Once | INTRAMUSCULAR | Status: AC
  Administered 2016-10-21: 4 mg via INTRAVENOUS
  Filled 2016-10-21: qty 2

## 2016-10-21 MED ORDER — PHENOL 1.4 % MT LIQD: 1.0000 | OROMUCOSAL | Status: DC | PRN

## 2016-10-21 MED ORDER — ONDANSETRON HCL 4 MG PO TABS: 4.0000 mg | ORAL_TABLET | Freq: Four times a day (QID) | ORAL | Status: DC | PRN

## 2016-10-21 MED ORDER — MORPHINE SULFATE (PF) 4 MG/ML IV SOLN
4.0000 mg | Freq: Once | INTRAVENOUS | Status: DC
  Filled 2016-10-21 (×2): qty 1

## 2016-10-21 MED ORDER — ACETAMINOPHEN 650 MG RE SUPP: 650.0000 mg | Freq: Four times a day (QID) | RECTAL | Status: DC | PRN

## 2016-10-21 MED ORDER — SODIUM CHLORIDE 0.9 % IV SOLN: Freq: Once | INTRAVENOUS | Status: DC

## 2016-10-21 MED ORDER — SODIUM CHLORIDE 0.9 % IV SOLN
INTRAVENOUS | Status: DC
  Administered 2016-10-21 (×2): via INTRAVENOUS

## 2016-10-21 MED ORDER — ONDANSETRON HCL 4 MG/2ML IJ SOLN
4.0000 mg | Freq: Four times a day (QID) | INTRAMUSCULAR | Status: DC | PRN
  Administered 2016-10-21: 4 mg via INTRAVENOUS
  Filled 2016-10-21 (×2): qty 2

## 2016-10-21 MED ORDER — MORPHINE SULFATE (PF) 4 MG/ML IV SOLN
4.0000 mg | Freq: Once | INTRAVENOUS | Status: AC
  Administered 2016-10-21: 4 mg via INTRAVENOUS
  Filled 2016-10-21: qty 1

## 2016-10-21 MED ORDER — LORAZEPAM 2 MG/ML IJ SOLN
0.5000 mg | Freq: Four times a day (QID) | INTRAMUSCULAR | Status: DC | PRN
  Administered 2016-10-21 – 2016-10-23 (×4): 0.5 mg via INTRAVENOUS
  Filled 2016-10-21 (×4): qty 1

## 2016-10-21 MED ORDER — CLOTRIMAZOLE 1 % EX CREA
TOPICAL_CREAM | Freq: Two times a day (BID) | CUTANEOUS | Status: DC
  Administered 2016-10-21: 22:00:00 via TOPICAL
  Administered 2016-10-22: 1 via TOPICAL
  Administered 2016-10-23: 11:00:00 via TOPICAL
  Filled 2016-10-21: qty 15

## 2016-10-21 MED ORDER — BISACODYL 10 MG RE SUPP
10.0000 mg | Freq: Every day | RECTAL | Status: DC | PRN
Start: 2016-10-21 — End: 2016-10-23

## 2016-10-21 MED ORDER — MORPHINE SULFATE (PF) 2 MG/ML IV SOLN
2.0000 mg | INTRAVENOUS | Status: DC | PRN
  Administered 2016-10-21: 4 mg via INTRAVENOUS
  Filled 2016-10-21: qty 2

## 2016-10-21 MED ORDER — ENOXAPARIN SODIUM 40 MG/0.4ML ~~LOC~~ SOLN
40.0000 mg | SUBCUTANEOUS | Status: DC
  Administered 2016-10-21 – 2016-10-22 (×2): 40 mg via SUBCUTANEOUS
  Filled 2016-10-21 (×2): qty 0.4

## 2016-10-21 MED ORDER — KCL IN DEXTROSE-NACL 20-5-0.45 MEQ/L-%-% IV SOLN
INTRAVENOUS | Status: DC
  Administered 2016-10-21 – 2016-10-23 (×5): via INTRAVENOUS
  Filled 2016-10-21 (×4): qty 1000

## 2016-10-21 MED ORDER — ACETAMINOPHEN 325 MG PO TABS: 650.0000 mg | ORAL_TABLET | Freq: Four times a day (QID) | ORAL | Status: DC | PRN

## 2016-10-21 MED ORDER — FAMOTIDINE IN NACL 20-0.9 MG/50ML-% IV SOLN
20.0000 mg | Freq: Two times a day (BID) | INTRAVENOUS | Status: DC
  Administered 2016-10-21 – 2016-10-23 (×4): 20 mg via INTRAVENOUS
  Filled 2016-10-21 (×4): qty 50

## 2016-10-21 MED ORDER — IOPAMIDOL (ISOVUE-300) INJECTION 61%
100.0000 mL | Freq: Once | INTRAVENOUS | Status: AC | PRN
  Administered 2016-10-21: 100 mL via INTRAVENOUS

## 2016-10-21 MED ORDER — ONDANSETRON HCL 4 MG/2ML IJ SOLN: 4.0000 mg | Freq: Four times a day (QID) | INTRAMUSCULAR | Status: DC | PRN

## 2016-10-21 MED ORDER — MORPHINE SULFATE (PF) 4 MG/ML IV SOLN
4.0000 mg | INTRAVENOUS | Status: DC | PRN
  Administered 2016-10-21 (×3): 4 mg via INTRAVENOUS
  Filled 2016-10-21: qty 1

## 2016-10-21 NOTE — ED Notes (Signed)
Pt tolerated well. NG tube secured with special securing device.

## 2016-10-21 NOTE — ED Notes (Signed)
Patient transported to CT 

## 2016-10-21 NOTE — Progress Notes (Signed)
   Pt coming from [place] for inpatient admission for treatment of a small bowel obstruction. NG tube to be placed at [facility] [place] prior to transfer. No surgical consultation at this time this patient has no prior history of abdominal surgery and will likely do well with conservative management. Patient accepted to medical surgical bed.  Linna Darner, MD [facility] 10/21/2016, 7:29 AM

## 2016-10-21 NOTE — ED Provider Notes (Signed)
TIME SEEN: 4:25 AM  CHIEF COMPLAINT: Abdominal pain, nausea  HPI: Pt is a 46 y.o. male with history of anxiety and depression who presents to the emergency department with complaints of abdominal pain. Described as pressure, achy and severe. Pain started last night around 10 PM and progressively worsened. Unable to lie flat on his abdomen secondary to pain. No other aggravating or relieving factors. Denies fevers, chills. Has had nausea but no vomiting or diarrhea. No bloody stools or melena. No dysuria, hematuria, testicular pain or swelling, penile discharge. No history of abdominal surgeries. No sick contacts. Denies alcohol use, NSAID use recently.  Has recently had a normal bowel movement.  ROS: See HPI Constitutional: no fever  Eyes: no drainage  ENT: no runny nose   Cardiovascular:  no chest pain  Resp: no SOB  GI: no vomiting GU: no dysuria Integumentary: no rash  Allergy: no hives  Musculoskeletal: no leg swelling  Neurological: no slurred speech ROS otherwise negative  PAST MEDICAL HISTORY/PAST SURGICAL HISTORY:  Past Medical History:  Diagnosis Date  . Anxiety   . Depression   . Elevated hemoglobin ([place])   . Low testosterone     MEDICATIONS:  Prior to Admission medications   Medication Sig Start Date End Date Taking? Authorizing Provider  clotrimazole-betamethasone (LOTRISONE) cream APPLY AND RUB IN A THIN FILM TO AFFECTED AREAS TWICE DAILY IN THE MORNING AND EVENING 12/27/15   Historical Provider, MD  doxycycline (VIBRA-TABS) 100 MG tablet Take 1 tablet (100 mg total) by mouth 2 (two) times daily. 01/23/16   Sharion Balloon, FNP  FLUoxetine HCl 60 MG TABS TAKE 1 TABLET BY MOUTH AT BEDTIME FOR ANXIETY/DEPRESSION 01/18/16   Historical Provider, MD  fluticasone (FLONASE) 50 MCG/ACT nasal spray Place 2 sprays into both nostrils daily. 01/23/16   Sharion Balloon, FNP  gabapentin (NEURONTIN) 300 MG capsule 1 pill three times a day and 2 at night 11/02/15   Timmothy Euler, MD   guaiFENesin-codeine [facility]) 100-10 MG/5ML syrup Take 5 mLs by mouth every 4 (four) hours as needed for cough. 01/18/16   Mary-Margaret Hassell Done, FNP  HYDROcodone-homatropine (HYCODAN) 5-1.5 MG/5ML syrup Take 5 mLs by mouth every 8 (eight) hours as needed for cough. 01/23/16   Sharion Balloon, FNP  LORazepam (ATIVAN) 1 MG tablet Take 1 tablet by mouth 3 (three) times daily as needed. 12/17/15   Historical Provider, MD  montelukast (SINGULAIR) 10 MG tablet TAKE 1 TABLET (10 MG TOTAL) BY MOUTH AT BEDTIME. 05/23/16   Timmothy Euler, MD  sildenafil (REVATIO) 20 MG tablet TAKE 2-5 PILLS ONCE DAILY AS NEEDED 10/04/16   Terald Sleeper, PA-C  testosterone cypionate (DEPOTESTOSTERONE CYPIONATE) 200 MG/ML injection Inject 1 mL (200 mg total) into the muscle every 14 (fourteen) days. 05/03/16   Mary-Margaret Hassell Done, FNP  triamcinolone ointment (KENALOG) 0.5 % Apply 1 application topically 2 (two) times daily. 11/15/15   Timmothy Euler, MD    ALLERGIES:  Allergies  Allergen Reactions  . Amoxicillin   . Bactrim [Sulfamethoxazole-Trimethoprim] Other (See Comments)    Fever, body aches, chills  . Penicillins     SOCIAL HISTORY:  Social History  Substance Use Topics  . Smoking status: Never Smoker  . Smokeless tobacco: Never Used  . Alcohol use Yes     Comment: occ     FAMILY HISTORY: Family History  Problem Relation Age of Onset  . Cancer Mother   . Hypertension Father   . Diabetes Father   .  Diabetes Brother   . Cancer Paternal Grandmother     EXAM: BP (!) 142/105   Pulse 93   Temp 97.8 F (36.6 C) (Oral)   Resp 20   SpO2 99%  CONSTITUTIONAL: Alert and oriented and responds appropriately to questions. Well-appearing; well-nourished, Afebrile, appears uncomfortable HEAD: Normocephalic EYES: Conjunctivae clear, PERRL, EOMI ENT: normal nose; no rhinorrhea; moist mucous membranes NECK: Supple, no meningismus, no nuchal rigidity, no LAD  CARD: RRR; S1 and S2 appreciated; no murmurs,  no clicks, no rubs, no gallops RESP: Normal chest excursion without splinting or tachypnea; breath sounds clear and equal bilaterally; no wheezes, no rhonchi, no rales, no hypoxia or respiratory distress, speaking full sentences ABD/GI: Normal bowel sounds; non-distended; soft, tender throughout the abdomen worse in the upper abdomen, negative Murphy sign, no specific tenderness at McBurney's point, no rebound, no guarding, no peritoneal signs, no hepatosplenomegaly BACK:  The back appears normal and is non-tender to palpation, there is no CVA tenderness EXT: Normal ROM in all joints; non-tender to palpation; no edema; normal capillary refill; no cyanosis, no calf tenderness or swelling    SKIN: Normal color for age and race; warm; no rash NEURO: Moves all extremities equally, sensation to light touch intact diffusely, cranial nerves II through XII intact, normal speech PSYCH: The patient's mood and manner are appropriate. Grooming and personal hygiene are appropriate.  MEDICAL DECISION MAKING: Patient here with diffuse abdominal pain worse in the upper abdomen. Differential diagnosis includes small bowel obstruction, colitis, cholecystitis, pancreatitis, gastritis, appendicitis. We'll obtain labs, urine and a CT of his abdomen and pelvis for further evaluation. Will give IV fluids with pain and nausea medicine.  ED PROGRESS: 5:20 AM  Pt has a leukocytosis with left shift. LFTs, lipase normal. Creatinine 1.22 with normal GFR.  Patient's pain is improved to a 2/10.  7:15 AM  Pt's CT scan shows a segmental small bowel obstruction. Will place NG tube. Patient will need admission. PCP is Dr. Wendi Snipes.  He would like transfer to [facility].  Pt currently hemodynamically stable. We'll give second dose of IV morphine.  He reports he has never had a bowel obstruction before.  7:25 AM  D/w Dr. Marily Memos with hospitalist service at [facility] who agrees to accept at patient to medical/surgical bed to an  inpatient bed. We'll place holding orders. He does not feel general surgery needs to be consulted emergently. Patient and family have been updated with the plan.   I reviewed all nursing notes, vitals, pertinent old records, EKGs, labs, imaging (as available).    [place], DO 10/21/16 [phone]

## 2016-10-21 NOTE — H&P (Signed)
[facility] History and Physical   Patient: Paul Parrish [id number]   PCP: Kenn File, MD DOB: 1971-07-10   DOA: 10/21/2016   DOS: 10/21/2016   DOS: the patient was seen and examined on 10/21/2016  Patient coming from: The patient is coming from home.  Chief Complaint: abdominal pain and nausea  HPI: [name] is a 46 y.o. male with Past medical history of Anxiety, depression, chronic fatigue syndrome. Presented with complains of sudden onset of abdominal pain started last night. Along with nausea which is progressively worsening overnight. Patient has been passing gas but it is progressively getting less and less. Patient had a 1 small smear of BM today. With this patient presented to ER at [facility]. After initial evaluation patient was found to be having small bowel obstruction, NG tube was inserted and patient was brought to the [facility]. At the time of my evaluation patient denies any nausea continues to have some abdominal discomfort. No fever no chills reported. No prior surgical exposure. No recent change in medication. Prior to this episode patient denies any pending constipation. No significant weight loss reported.  ED Course: NG tube was inserted and patient was transferred from urgent care to [facility].  At his baseline ambulates without any support And is independent for most of his ADL; manages his medication on his own.  Review of Systems: as mentioned in the history of present illness.  All other systems reviewed and are negative.  Past Medical History:  Diagnosis Date  . Anxiety   . Depression   . Elevated hemoglobin ([place])   . Low testosterone    History reviewed. No pertinent surgical history. Social History:  reports that he has never smoked. He has never used smokeless tobacco. He reports that he drinks alcohol. He reports that he does not use drugs.  Allergies  Allergen Reactions  . Amoxicillin Other (See  Comments)    High fever.   . Bactrim [Sulfamethoxazole-Trimethoprim] Other (See Comments)    Fever, body aches, chills  . Penicillins Other (See Comments)    High fever.    Family History  Problem Relation Age of Onset  . Cancer Mother     kidney  . Hypertension Father   . Diabetes Father   . Diabetes Brother   . Cancer Paternal Grandmother      Prior to Admission medications   Medication Sig Start Date End Date Taking? Authorizing Provider  ALPRAZolam Duanne Moron) 1 MG tablet Take 1 mg by mouth 3 (three) times daily as needed for anxiety.   Yes Historical Provider, MD  amphetamine-dextroamphetamine (ADDERALL XR) 20 MG 24 hr capsule Take 20 mg by mouth daily.   Yes Historical Provider, MD  BuPROPion HCl (WELLBUTRIN PO) Take by mouth.   Yes Historical Provider, MD  FLUoxetine (PROZAC) 20 MG capsule Take 60 mg by mouth at bedtime.   Yes Historical Provider, MD  clotrimazole-betamethasone (LOTRISONE) cream daily 12/27/15   Historical Provider, MD  gabapentin (NEURONTIN) 300 MG capsule 1 pill three times a day and 2 at night Patient taking differently: 300 mg. 1 pill AM and 2 at night 11/02/15   Timmothy Euler, MD  sildenafil (REVATIO) 20 MG tablet TAKE 2-5 PILLS ONCE DAILY AS NEEDED 10/04/16   Terald Sleeper, PA-C  testosterone cypionate (DEPOTESTOSTERONE CYPIONATE) 200 MG/ML injection Inject 1 mL (200 mg total) into the muscle every 14 (fourteen) days. 05/03/16   Mary-Margaret Hassell Done, FNP    Physical Exam: Vitals:  10/21/16 1000 10/21/16 1100 10/21/16 1128 10/21/16 1200  BP: 129/76 125/77 125/77 117/78  Pulse: 81 81 91 88  Resp:   18   Temp:      TempSrc:      SpO2: 93% 95% 97% 91%  Weight:      Height:        General: Alert, Awake and Oriented to Time, Place and Person. Appear in moderate distress, affect appropriate Eyes: PERRL, Conjunctiva normal ENT: Oral Mucosa clear moist. Neck: no JVD, n Abnormal Mass Or lumps Cardiovascular: S1 and S2 Present, no Murmur, Peripheral Pulses  Present Respiratory: Bilateral Air entry equal and Decreased, no use of accessory muscle, Clear to Auscultation, no Crackles, no wheezes Abdomen: Bowel Sound present, Soft and diffuse tenderness Skin: no redness, no Rash, no induration Extremities: no Pedal edema, no calf tenderness Neurologic: Grossly no focal neuro deficit. Bilaterally Equal motor strength  Labs on Admission:  CBC:  Recent Labs Lab 10/21/16 0440  WBC 14.8*  NEUTROABS 11.4*  HGB 19.3*  HCT 54.2*  MCV 84.2  PLT 123456   Basic Metabolic Panel:  Recent Labs Lab 10/21/16 0440  NA 134*  K 4.1  CL 99*  CO2 25  GLUCOSE 120*  BUN 11  CREATININE 1.22  CALCIUM 9.4   GFR: Estimated Creatinine Clearance: 107.8 mL/min (by C-G formula based on SCr of 1.22 mg/dL). Liver Function Tests:  Recent Labs Lab 10/21/16 0440  AST 28  ALT 31  ALKPHOS 75  BILITOT 0.8  PROT 7.8  ALBUMIN 4.4    Recent Labs Lab 10/21/16 0440  LIPASE 23   No results for input(s): AMMONIA in the last 168 hours. Coagulation Profile: No results for input(s): INR, PROTIME in the last 168 hours. Cardiac Enzymes: No results for input(s): CKTOTAL, CKMB, CKMBINDEX, TROPONINI in the last 168 hours. BNP (last 3 results) No results for input(s): PROBNP in the last 8760 hours. HbA1C: No results for input(s): HGBA1C in the last 72 hours. CBG: No results for input(s): GLUCAP in the last 168 hours. Lipid Profile: No results for input(s): CHOL, HDL, LDLCALC, TRIG, CHOLHDL, LDLDIRECT in the last 72 hours. Thyroid Function Tests: No results for input(s): TSH, T4TOTAL, FREET4, T3FREE, THYROIDAB in the last 72 hours. Anemia Panel: No results for input(s): VITAMINB12, FOLATE, FERRITIN, TIBC, IRON, RETICCTPCT in the last 72 hours. Urine analysis:    Component Value Date/Time   COLORURINE YELLOW 10/21/2016 0620   APPEARANCEUR CLEAR 10/21/2016 0620   LABSPEC 1.039 (H) 10/21/2016 0620   PHURINE 8.0 10/21/2016 0620   GLUCOSEU NEGATIVE 10/21/2016  0620   HGBUR NEGATIVE 10/21/2016 0620   BILIRUBINUR NEGATIVE 10/21/2016 0620   KETONESUR NEGATIVE 10/21/2016 0620   PROTEINUR NEGATIVE 10/21/2016 0620   NITRITE NEGATIVE 10/21/2016 0620   LEUKOCYTESUR NEGATIVE 10/21/2016 0620    Radiological Exams on Admission: Ct Abdomen Pelvis W Contrast  Result Date: 10/21/2016 CLINICAL DATA:  Periumbilical abdominal pain beginning 8 hours ago. Associated nausea up. EXAM: CT ABDOMEN AND PELVIS WITH CONTRAST TECHNIQUE: Multidetector CT imaging of the abdomen and pelvis was performed using the standard protocol following bolus administration of intravenous contrast. CONTRAST:  140mL ISOVUE-300 IOPAMIDOL (ISOVUE-300) INJECTION 61% COMPARISON:  10/25/2015 FINDINGS: Lower chest: Normal Hepatobiliary: Liver appears normal.  No calcified gallstones. Pancreas: Normal Spleen: Normal Adrenals/Urinary Tract: Adrenal glands are normal. Kidneys are normal. No cyst, mass, stone or hydronephrosis. Stomach/Bowel: Abnormal dilated fluid and air-filled proximal small intestine beginning beyond the ligament of Treitz with normal caliber distal small intestine. Findings are consistent  with a closed loop small bowel obstruction. There is an abnormal appearance at the written mesentery in that region in the central abdomen, with some stranding and distortion. Large intestine appears unremarkable. Vascular/Lymphatic: Normal Reproductive: Normal Other: No free fluid or air. Musculoskeletal: Ordinary lower lumbar degenerative changes. IMPRESSION: Segmental small bowel obstruction suggesting closed loop obstruction. Abnormal appearance of the mesentery at the root of that region, nonspecific. Electronically Signed   By: Nelson Chimes M.D.   On: 10/21/2016 06:49   Dg Chest Portable 1 View  Result Date: 10/21/2016 CLINICAL DATA:  Mid abdominal pain since this morning. Nasogastric tube placement. EXAM: PORTABLE CHEST 1 VIEW COMPARISON:  08/06/2015. FINDINGS: Poor inspiration. Associated  vascular crowding. No gross change in a normal sized heart. Clear lungs. Nasogastric tube extending into the stomach, poorly visualized distally. Mild scoliosis. IMPRESSION: No acute abnormality. Electronically Signed   By: Claudie Revering M.D.   On: 10/21/2016 08:48    Assessment/Plan 1. SBO (small bowel obstruction) Closed loop small bowel obstruction based on the CT scan with mesenteric stranding and distortion. Patient S/P NG tube insertion. Mild abdominal pain at present. Continue IV normal saline, once this is complete, I will transition the patient to D5 half normal saline with potassium. Daily electrolytes monitoring. Given the closed loop nature of the obstruction, general surgery consulted. Appreciate input. IV morphine for pain control, IV Zofran for nausea control.  2. History of depression with anxiety. Patient is on Xanax and Prozac at home. Also on gabapentin. I will change it to lorazepam IV at present. Monitor.  3. Polycythemia, secondary. Patient has chronically elevated hemoglobin count. At the present it appears to be worsening due to hemoconcentration. Continue IV hydration. Monitor.  4. Psoriasis. Continue steroid ointment.  Nutrition: NPO DVT Prophylaxis: subcutaneous Heparin  Advance goals of care discussion: full code   Consults: general surgery  Family Communication: family was present at bedside, at the time of interview.  Opportunity was given to ask question and all questions were answered satisfactorily.  Disposition: Admitted as observation, med-surge unit. Likely to be discharged home, in 2 days.  Author: Berle Mull, MD [facility] Pager: [phone] 10/21/2016  If 7PM-7AM, please contact night-coverage [email or web address] Password [email or web address]

## 2016-10-21 NOTE — ED Triage Notes (Signed)
Pt stated abd pain around umbilicus around 0000000 last night. Some nausea, but denies vomiting, fever, chills, diarrhea, or other symptoms.

## 2016-10-21 NOTE — ED Notes (Signed)
Attempted to call report to nurse on 6N; nurse off the floor; this RN contact info provided.

## 2016-10-21 NOTE — Consult Note (Addendum)
Reason for Consult:bowel obstruction Referring Physician: Susumu Hackler is an 46 y.o. male.  HPI: 46 yo male with 1 day of epigastric abdominal pain. Pain began at 10pm last night, he came to the ER in the morning and was diagnosed with small bowel obstruction. He had NGT placed initially 749m fluid evacuated. Currently, he has no pain, he notes flatus 1x today, last BM was yesterday. He has had no previous surgeries. He does not smoke. He has severe anxiety  Past Medical History:  Diagnosis Date  . Anxiety   . Depression   . Elevated hemoglobin ([place]   . Low testosterone     History reviewed. No pertinent surgical history.  Family History  Problem Relation Age of Onset  . Cancer Mother     kidney  . Hypertension Father   . Diabetes Father   . Diabetes Brother   . Cancer Paternal Grandmother     Social History:  reports that he has never smoked. He has never used smokeless tobacco. He reports that he drinks alcohol. He reports that he does not use drugs.  Allergies:  Allergies  Allergen Reactions  . Amoxicillin Other (See Comments)    High fever.   . Bactrim [Sulfamethoxazole-Trimethoprim] Other (See Comments)    Fever, body aches, chills  . Penicillins Other (See Comments)    High fever.     Medications: I have reviewed the patient's current medications.  Results for orders placed or performed during the hospital encounter of 10/21/16 (from the past 48 hour(s))  CBC with Differential     Status: Abnormal   Collection Time: 10/21/16  4:40 AM  Result Value Ref Range   WBC 14.8 (H) 4.0 - 10.5 K/uL   RBC 6.44 (H) 4.22 - 5.81 MIL/uL   Hemoglobin 19.3 (H) 13.0 - 17.0 g/dL   HCT 54.2 (H) 39.0 - 52.0 %   MCV 84.2 78.0 - 100.0 fL   MCH 30.0 26.0 - 34.0 pg   MCHC 35.6 30.0 - 36.0 g/dL   RDW 13.6 11.5 - 15.5 %   Platelets 315 150 - 400 K/uL   Neutrophils Relative % 77 %   Neutro Abs 11.4 (H) 1.7 - 7.7 K/uL   Lymphocytes Relative 14 %   Lymphs Abs 2.0 0.7 -  4.0 K/uL   Monocytes Relative 9 %   Monocytes Absolute 1.3 (H) 0.1 - 1.0 K/uL   Eosinophils Relative 0 %   Eosinophils Absolute 0.1 0.0 - 0.7 K/uL   Basophils Relative 0 %   Basophils Absolute 0.0 0.0 - 0.1 K/uL  Comprehensive metabolic panel     Status: Abnormal   Collection Time: 10/21/16  4:40 AM  Result Value Ref Range   Sodium 134 (L) 135 - 145 mmol/L   Potassium 4.1 3.5 - 5.1 mmol/L   Chloride 99 (L) 101 - 111 mmol/L   CO2 25 22 - 32 mmol/L   Glucose, Bld 120 (H) 65 - 99 mg/dL   BUN 11 6 - 20 mg/dL   Creatinine, Ser 1.22 0.61 - 1.24 mg/dL   Calcium 9.4 8.9 - 10.3 mg/dL   Total Protein 7.8 6.5 - 8.1 g/dL   Albumin 4.4 3.5 - 5.0 g/dL   AST 28 15 - 41 U/L   ALT 31 17 - 63 U/L   Alkaline Phosphatase 75 38 - 126 U/L   Total Bilirubin 0.8 0.3 - 1.2 mg/dL   GFR calc non Af Amer >60 >60 mL/min   GFR calc  Af Amer >60 >60 mL/min    Comment: (NOTE) The eGFR has been calculated using the CKD EPI equation. This calculation has not been validated in all clinical situations. eGFR's persistently <60 mL/min signify possible Chronic Kidney Disease.    Anion gap 10 5 - 15  Lipase, blood     Status: None   Collection Time: 10/21/16  4:40 AM  Result Value Ref Range   Lipase 23 11 - 51 U/L  Urinalysis, Routine w reflex microscopic     Status: Abnormal   Collection Time: 10/21/16  6:20 AM  Result Value Ref Range   Color, Urine YELLOW YELLOW   APPearance CLEAR CLEAR   Specific Gravity, Urine 1.039 (H) 1.005 - 1.030   pH 8.0 5.0 - 8.0   Glucose, UA NEGATIVE NEGATIVE mg/dL   Hgb urine dipstick NEGATIVE NEGATIVE   Bilirubin Urine NEGATIVE NEGATIVE   Ketones, ur NEGATIVE NEGATIVE mg/dL   Protein, ur NEGATIVE NEGATIVE mg/dL   Nitrite NEGATIVE NEGATIVE   Leukocytes, UA NEGATIVE NEGATIVE    Comment: Microscopic not done on urines with negative protein, blood, leukocytes, nitrite, or glucose < 500 mg/dL.    Ct Abdomen Pelvis W Contrast  Result Date: 10/21/2016 CLINICAL DATA:   Periumbilical abdominal pain beginning 8 hours ago. Associated nausea up. EXAM: CT ABDOMEN AND PELVIS WITH CONTRAST TECHNIQUE: Multidetector CT imaging of the abdomen and pelvis was performed using the standard protocol following bolus administration of intravenous contrast. CONTRAST:  130m ISOVUE-300 IOPAMIDOL (ISOVUE-300) INJECTION 61% COMPARISON:  10/25/2015 FINDINGS: Lower chest: Normal Hepatobiliary: Liver appears normal.  No calcified gallstones. Pancreas: Normal Spleen: Normal Adrenals/Urinary Tract: Adrenal glands are normal. Kidneys are normal. No cyst, mass, stone or hydronephrosis. Stomach/Bowel: Abnormal dilated fluid and air-filled proximal small intestine beginning beyond the ligament of Treitz with normal caliber distal small intestine. Findings are consistent with a closed loop small bowel obstruction. There is an abnormal appearance at the written mesentery in that region in the central abdomen, with some stranding and distortion. Large intestine appears unremarkable. Vascular/Lymphatic: Normal Reproductive: Normal Other: No free fluid or air. Musculoskeletal: Ordinary lower lumbar degenerative changes. IMPRESSION: Segmental small bowel obstruction suggesting closed loop obstruction. Abnormal appearance of the mesentery at the root of that region, nonspecific. Electronically Signed   By: MNelson ChimesM.D.   On: 10/21/2016 06:49   Dg Chest Portable 1 View  Result Date: 10/21/2016 CLINICAL DATA:  Mid abdominal pain since this morning. Nasogastric tube placement. EXAM: PORTABLE CHEST 1 VIEW COMPARISON:  08/06/2015. FINDINGS: Poor inspiration. Associated vascular crowding. No gross change in a normal sized heart. Clear lungs. Nasogastric tube extending into the stomach, poorly visualized distally. Mild scoliosis. IMPRESSION: No acute abnormality. Electronically Signed   By: SClaudie ReveringM.D.   On: 10/21/2016 08:48    Review of Systems  Constitutional: Negative for chills and fever.  HENT:  Negative for hearing loss.   Eyes: Negative for blurred vision and double vision.  Respiratory: Negative for cough and hemoptysis.   Cardiovascular: Negative for chest pain and palpitations.  Gastrointestinal: Positive for abdominal pain, nausea and vomiting.  Genitourinary: Negative for dysuria and urgency.  Musculoskeletal: Negative for myalgias and neck pain.  Skin: Negative for itching and rash.  Neurological: Negative for dizziness, tingling and headaches.  Endo/Heme/Allergies: Does not bruise/bleed easily.  Psychiatric/Behavioral: Negative for depression and suicidal ideas.   Blood pressure 117/78, pulse 88, temperature 97.8 F (36.6 C), temperature source Oral, resp. rate 18, height 6' 3"  (1.905 m), weight 122.5 kg (  270 lb), SpO2 91 %. Physical Exam  Nursing note and vitals reviewed. Constitutional: He is oriented to person, place, and time. He appears well-developed and well-nourished.  HENT:  Head: Normocephalic and atraumatic.  Eyes: Conjunctivae and EOM are normal. No scleral icterus.  Neck: Normal range of motion. Neck supple.  Cardiovascular: Normal rate and regular rhythm.   Respiratory: Effort normal and breath sounds normal. He has no wheezes. He has no rales. He exhibits no tenderness.  GI: Soft. He exhibits no distension. There is no tenderness. There is no rebound.  Musculoskeletal: Normal range of motion. He exhibits no edema.  Neurological: He is alert and oriented to person, place, and time.  Skin: Skin is warm and dry.  Psychiatric: He has a normal mood and affect. His behavior is normal.    Assessment/Plan: 47 yo male with SBO, CT reading as closed loop obstruction would normally required urgent operative intervention especially in a patient with leukocytosis, especially without previous surgery, however, this patient underwent 16h of conservative management and now has resolution of pain and no tenderness on exam. -stat Xr abdomen to assess current status of  SBO. If there is any abnormality will proceed with diagnostic laparoscopy -continue NG tube -continue NPO  Arta Bruce Kinsinger 10/21/2016, 6:40 PM   XR of abdomen shows contrast into colon and no dilated loops of small bowel, I think his SBO is resolved and he has no signs of closed loop obstruction

## 2016-10-22 ENCOUNTER — Encounter (HOSPITAL_COMMUNITY): Payer: Self-pay | Admitting: General Practice

## 2016-10-22 DIAGNOSIS — R5382 Chronic fatigue, unspecified: Secondary | ICD-10-CM | POA: Diagnosis present

## 2016-10-22 DIAGNOSIS — L409 Psoriasis, unspecified: Secondary | ICD-10-CM | POA: Diagnosis present

## 2016-10-22 DIAGNOSIS — E871 Hypo-osmolality and hyponatremia: Secondary | ICD-10-CM | POA: Diagnosis not present

## 2016-10-22 DIAGNOSIS — D751 Secondary polycythemia: Secondary | ICD-10-CM | POA: Diagnosis not present

## 2016-10-22 DIAGNOSIS — K5669 Other partial intestinal obstruction: Secondary | ICD-10-CM | POA: Diagnosis not present

## 2016-10-22 DIAGNOSIS — Z79899 Other long term (current) drug therapy: Secondary | ICD-10-CM | POA: Diagnosis not present

## 2016-10-22 DIAGNOSIS — Z7951 Long term (current) use of inhaled steroids: Secondary | ICD-10-CM | POA: Diagnosis not present

## 2016-10-22 DIAGNOSIS — F419 Anxiety disorder, unspecified: Secondary | ICD-10-CM | POA: Diagnosis present

## 2016-10-22 DIAGNOSIS — R109 Unspecified abdominal pain: Secondary | ICD-10-CM | POA: Diagnosis present

## 2016-10-22 DIAGNOSIS — K56609 Unspecified intestinal obstruction, unspecified as to partial versus complete obstruction: Secondary | ICD-10-CM | POA: Diagnosis not present

## 2016-10-22 DIAGNOSIS — E291 Testicular hypofunction: Secondary | ICD-10-CM | POA: Diagnosis present

## 2016-10-22 DIAGNOSIS — F329 Major depressive disorder, single episode, unspecified: Secondary | ICD-10-CM | POA: Diagnosis present

## 2016-10-22 LAB — CBC WITH DIFFERENTIAL/PLATELET
BASOS ABS: 0 10*3/uL (ref 0.0–0.1)
Basophils Relative: 0 %
EOS ABS: 0.1 10*3/uL (ref 0.0–0.7)
EOS PCT: 1 %
HCT: 48.6 % (ref 39.0–52.0)
Hemoglobin: 16.5 g/dL (ref 13.0–17.0)
Lymphocytes Relative: 25 %
Lymphs Abs: 2 10*3/uL (ref 0.7–4.0)
MCH: 30.3 pg (ref 26.0–34.0)
MCHC: 34 g/dL (ref 30.0–36.0)
MCV: 89.3 fL (ref 78.0–100.0)
MONO ABS: 0.8 10*3/uL (ref 0.1–1.0)
Monocytes Relative: 9 %
Neutro Abs: 5.4 10*3/uL (ref 1.7–7.7)
Neutrophils Relative %: 65 %
PLATELETS: 247 10*3/uL (ref 150–400)
RBC: 5.44 MIL/uL (ref 4.22–5.81)
RDW: 13.7 % (ref 11.5–15.5)
WBC: 8.3 10*3/uL (ref 4.0–10.5)

## 2016-10-22 LAB — COMPREHENSIVE METABOLIC PANEL
ALK PHOS: 56 U/L (ref 38–126)
ALT: 22 U/L (ref 17–63)
AST: 23 U/L (ref 15–41)
Albumin: 3.5 g/dL (ref 3.5–5.0)
Anion gap: 4 — ABNORMAL LOW (ref 5–15)
BILIRUBIN TOTAL: 0.7 mg/dL (ref 0.3–1.2)
BUN: 10 mg/dL (ref 6–20)
CO2: 28 mmol/L (ref 22–32)
CREATININE: 1.19 mg/dL (ref 0.61–1.24)
Calcium: 8.4 mg/dL — ABNORMAL LOW (ref 8.9–10.3)
Chloride: 102 mmol/L (ref 101–111)
GFR calc Af Amer: >60 mL/min (ref >60)
Glucose, Bld: 98 mg/dL (ref 65–99)
Potassium: 4.1 mmol/L (ref 3.5–5.1)
Sodium: 134 mmol/L — ABNORMAL LOW (ref 135–145)
TOTAL PROTEIN: 6.4 g/dL — AB (ref 6.5–8.1)

## 2016-10-22 LAB — MAGNESIUM: MAGNESIUM: 2.1 mg/dL (ref 1.7–2.4)

## 2016-10-22 MED ORDER — POLYETHYLENE GLYCOL 3350 17 G PO PACK
17.0000 g | PACK | Freq: Every day | ORAL | Status: DC
  Administered 2016-10-22 – 2016-10-23 (×2): 17 g via ORAL
  Filled 2016-10-22 (×2): qty 1

## 2016-10-22 NOTE — Progress Notes (Signed)
[facility] Progress Note     Subjective: Wife at bedside. Denies abdominal pain, nausea, vomiting. Having flatus. Denies BM.   Objective: Vital signs in last 24 hours: Temp:  [97.4 F (36.3 C)-97.7 F (36.5 C)] 97.4 F (36.3 C) (01/23 0629) Pulse Rate:  [81-91] 85 (01/23 0629) Resp:  [17-18] 17 (01/23 0629) BP: (107-129)/(59-80) 107/59 (01/23 0629) SpO2:  [91 %-97 %] 96 % (01/23 0629) Last BM Date: 10/21/16  Intake/Output from previous day: 01/22 0701 - 01/23 0700 In: 1733.3 [I.V.:1623.3; NG/GT:60; IV Piggyback:50] Out: 1225 [Urine:825; Emesis/NG output:400] Intake/Output this shift: Total I/O In: -  Out: 350 [Urine:350]  PE: Gen:  Alert, NAD, pleasant Card:  RRR Pulm:  CTAB, normal effort Abd: firm, non-tender, nondistended, +BS in all 4 quadrants, no peritonitis. Ext:  Psoriasis of bilateral lower extremities and abdomen  Lab Results:   Recent Labs  10/21/16 0440 10/22/16 0448  WBC 14.8* 8.3  HGB 19.3* 16.5  HCT 54.2* 48.6  PLT 315 247   BMET  Recent Labs  10/21/16 0440 10/22/16 0448  NA 134* 134*  K 4.1 4.1  CL 99* 102  CO2 25 28  GLUCOSE 120* 98  BUN 11 10  CREATININE 1.22 1.19  CALCIUM 9.4 8.4*   PT/INR No results for input(s): LABPROT, INR in the last 72 hours. CMP     Component Value Date/Time   NA 134 (L) 10/22/2016 0448   NA 137 06/15/2016 0820   K 4.1 10/22/2016 0448   CL 102 10/22/2016 0448   CO2 28 10/22/2016 0448   GLUCOSE 98 10/22/2016 0448   BUN 10 10/22/2016 0448   BUN 7 06/15/2016 0820   CREATININE 1.19 10/22/2016 0448   CREATININE 1.12 01/07/2013 1647   CALCIUM 8.4 (L) 10/22/2016 0448   PROT 6.4 (L) 10/22/2016 0448   PROT 7.2 06/15/2016 0820   ALBUMIN 3.5 10/22/2016 0448   ALBUMIN 4.3 06/15/2016 0820   AST 23 10/22/2016 0448   ALT 22 10/22/2016 0448   ALKPHOS 56 10/22/2016 0448   BILITOT 0.7 10/22/2016 0448   BILITOT 0.7 06/15/2016 0820   GFRNONAA >60 10/22/2016 0448   GFRNONAA 81 01/07/2013 1647    GFRAA >60 10/22/2016 0448   GFRAA >89 01/07/2013 1647   Lipase     Component Value Date/Time   LIPASE 23 10/21/2016 0440       Studies/Results: Ct Abdomen Pelvis W Contrast  Result Date: 10/21/2016 CLINICAL DATA:  Periumbilical abdominal pain beginning 8 hours ago. Associated nausea up. EXAM: CT ABDOMEN AND PELVIS WITH CONTRAST TECHNIQUE: Multidetector CT imaging of the abdomen and pelvis was performed using the standard protocol following bolus administration of intravenous contrast. CONTRAST:  170mL ISOVUE-300 IOPAMIDOL (ISOVUE-300) INJECTION 61% COMPARISON:  10/25/2015 FINDINGS: Lower chest: Normal Hepatobiliary: Liver appears normal.  No calcified gallstones. Pancreas: Normal Spleen: Normal Adrenals/Urinary Tract: Adrenal glands are normal. Kidneys are normal. No cyst, mass, stone or hydronephrosis. Stomach/Bowel: Abnormal dilated fluid and air-filled proximal small intestine beginning beyond the ligament of Treitz with normal caliber distal small intestine. Findings are consistent with a closed loop small bowel obstruction. There is an abnormal appearance at the written mesentery in that region in the central abdomen, with some stranding and distortion. Large intestine appears unremarkable. Vascular/Lymphatic: Normal Reproductive: Normal Other: No free fluid or air. Musculoskeletal: Ordinary lower lumbar degenerative changes. IMPRESSION: Segmental small bowel obstruction suggesting closed loop obstruction. Abnormal appearance of the mesentery at the root of that region, nonspecific. Electronically Signed   By: Elta Guadeloupe  Shogry M.D.   On: 10/21/2016 06:49   Dg Chest Portable 1 View  Result Date: 10/21/2016 CLINICAL DATA:  Mid abdominal pain since this morning. Nasogastric tube placement. EXAM: PORTABLE CHEST 1 VIEW COMPARISON:  08/06/2015. FINDINGS: Poor inspiration. Associated vascular crowding. No gross change in a normal sized heart. Clear lungs. Nasogastric tube extending into the stomach,  poorly visualized distally. Mild scoliosis. IMPRESSION: No acute abnormality. Electronically Signed   By: Claudie Revering M.D.   On: 10/21/2016 08:48   Dg Abd Portable 1v  Result Date: 10/21/2016 CLINICAL DATA:  Acute onset of central abdominal pain. Initial encounter. EXAM: PORTABLE ABDOMEN - 1 VIEW COMPARISON:  CT of the abdomen and pelvis performed earlier today at 6:11 a.m. FINDINGS: The visualized bowel gas pattern is unremarkable. Scattered air and stool filled loops of colon are seen; no abnormal dilatation of small bowel loops is seen to suggest small bowel obstruction. No free intra-abdominal air is identified, though evaluation for free air is limited on a single supine view. The enteric tube is noted ending overlying the body of the stomach. The visualized osseous structures are within normal limits; the sacroiliac joints are unremarkable in appearance. IMPRESSION: Unremarkable bowel gas pattern; no free intra-abdominal air seen. Moderate amount of stool noted in the colon. Electronically Signed   By: Garald Balding M.D.   On: 10/21/2016 19:32    Anti-infectives: Anti-infectives    None     Assessment/Plan SBO  - no medical history of abdominal surgeries/hernias - CT 1/22 in ED significant for possible closed loop bowel obstruction >>NGT placed - clinically improved: non-tender, non-distended, return of bowel function - DG abd evening of 1/22 unremarkable bowel gas pattern, no free air - WBC, CMET, lipase, and UA are within normal limits (mild hyponatremia 134 mmol/L)  Plan: clinically improving, having flatus. Clamp NGT and give clear liquids. If tolerates clears, NGT can be removed and diet advanced as tolerated. Stool noted on DG abdomen - miralax/dulcolax PRN to encourage bowel function.  General surgery will sign off but will remain available as needed. Thank you.      LOS: 0 days    Jill Alexanders , [facility] Surgery 10/22/2016, 8:26 AM Pager:  [phone] Consults: [phone] Mon-Fri 7:00 am-4:30 pm Sat-Sun 7:00 am-11:30 am

## 2016-10-22 NOTE — Progress Notes (Signed)
[facility] Progress Note  Patient: Paul Parrish [id number]   PCP: Kenn File, MD DOB: [birth year]   DOA: 10/21/2016   DOS: 10/22/2016   Date of Service: the patient was seen and examined on 10/22/2016  Brief hospital course: Paul Parrish is a 46 y.o. male with Past medical history of Anxiety, depression, chronic fatigue syndrome. Presented with complains of sudden onset of abdominal pain started last night. Along with nausea which is progressively worsening overnight. Patient has been passing gas but it is progressively getting less and less. Patient had a 1 small smear of BM today. With this patient presented to ER at [facility] [place]. After initial evaluation patient was found to be having small bowel obstruction, NG tube was inserted and patient was brought to the [facility]. At the time of my evaluation patient denies any nausea continues to have some abdominal discomfort. No fever no chills reported. No prior surgical exposure. Currently further plan is advance diet.  Assessment and Plan: 1. SBO (small bowel obstruction) Closed loop small bowel obstruction based on the CT scan with mesenteric stranding and distortion. Patient S/P NG tube insertion. No abdominal pain at present. Continue D5 half normal saline with potassium. Daily electrolytes monitoring. Given the closed loop nature of the obstruction, general surgery consulted. Appreciate input.Repeat x-ray better, patient is passing gas. Surgery recommends to clamp the NG tube and advance diet to clear liquid. IV morphine for pain control, IV Zofran for nausea control.  2. History of depression with anxiety. Patient is on Xanax and Prozac at home. Also on gabapentin. I will change it to lorazepam IV at present. Monitor. Resume once able to take full liquid diet.  3. Polycythemia, secondary. Patient has chronically elevated hemoglobin count. At the present it appears to be worsening due to  hemoconcentration. Continue IV hydration. Monitor.  4. Psoriasis. Continue steroid ointment.  Bowel regimen: last BM prior to admission Diet: clear liquid diet, NG tube may be removed if no vomiting or nausea DVT Prophylaxis: subcutaneous Heparin  Advance goals of care discussion: full code  Family Communication: family was present at bedside, at the time of interview. The pt provided permission to discuss medical plan with the family. Opportunity was given to ask question and all questions were answered satisfactorily.   Disposition:  Discharge to home. Expected discharge date: 01/24-25/2018, diet tolerance  Consultants: general surgry Procedures: none  Antibiotics: Anti-infectives    None        Subjective: feeling better  Objective: Physical Exam: Vitals:   10/21/16 1200 10/21/16 2159 10/22/16 0629 10/22/16 1347  BP: 117/78 128/78 (!) 107/59 (!) 143/90  Pulse: 88 82 85 72  Resp:  18 17 18   Temp:  97.7 F (36.5 C) 97.4 F (36.3 C) 97.5 F (36.4 C)  TempSrc:  Oral Oral Oral  SpO2: 91% 95% 96% 94%  Weight:      Height:        Intake/Output Summary (Last 24 hours) at 10/22/16 1457 Last data filed at 10/22/16 1347  Gross per 24 hour  Intake          2093.33 ml  Output             3475 ml  Net         -1381.67 ml   Filed Weights   10/21/16 0422  Weight: 122.5 kg (270 lb)    General: Alert, Awake and Oriented to Time, Place and Person. Appear in mild distress, affect appropriate Eyes: PERRL,  Conjunctiva normal ENT: Oral Mucosa clear moist. Neck: no JVD, no Abnormal Mass Or lumps Cardiovascular: S1 and S2 Present, no Murmur, Respiratory: Bilateral Air entry equal and Decreased, no use of accessory muscle, Clear to Auscultation, no Crackles, no wheezes Abdomen: Bowel Sound present, Soft and no tenderness Skin: no redness, no Rash, no induration Extremities: no Pedal edema, no calf tenderness Neurologic: Grossly no focal neuro deficit. Bilaterally Equal  motor strength  Data Reviewed: CBC:  Recent Labs Lab 10/21/16 0440 10/22/16 0448  WBC 14.8* 8.3  NEUTROABS 11.4* 5.4  HGB 19.3* 16.5  HCT 54.2* 48.6  MCV 84.2 89.3  PLT 315 [id number]   Basic Metabolic Panel:  Recent Labs Lab 10/21/16 0440 10/22/16 0448  NA 134* 134*  K 4.1 4.1  CL 99* 102  CO2 25 28  GLUCOSE 120* 98  BUN 11 10  CREATININE 1.22 1.19  CALCIUM 9.4 8.4*  MG  --  2.1    Liver Function Tests:  Recent Labs Lab 10/21/16 0440 10/22/16 0448  AST 28 23  ALT 31 22  ALKPHOS 75 56  BILITOT 0.8 0.7  PROT 7.8 6.4*  ALBUMIN 4.4 3.5    Recent Labs Lab 10/21/16 0440  LIPASE 23   No results for input(s): AMMONIA in the last 168 hours. Coagulation Profile: No results for input(s): INR, PROTIME in the last 168 hours. Cardiac Enzymes: No results for input(s): CKTOTAL, CKMB, CKMBINDEX, TROPONINI in the last 168 hours. BNP (last 3 results) No results for input(s): PROBNP in the last 8760 hours.  CBG: No results for input(s): GLUCAP in the last 168 hours.  Studies: Dg Abd Portable 1v  Result Date: 10/21/2016 CLINICAL DATA:  Acute onset of central abdominal pain. Initial encounter. EXAM: PORTABLE ABDOMEN - 1 VIEW COMPARISON:  CT of the abdomen and pelvis performed earlier today at 6:11 a.m. FINDINGS: The visualized bowel gas pattern is unremarkable. Scattered air and stool filled loops of colon are seen; no abnormal dilatation of small bowel loops is seen to suggest small bowel obstruction. No free intra-abdominal air is identified, though evaluation for free air is limited on a single supine view. The enteric tube is noted ending overlying the body of the stomach. The visualized osseous structures are within normal limits; the sacroiliac joints are unremarkable in appearance. IMPRESSION: Unremarkable bowel gas pattern; no free intra-abdominal air seen. Moderate amount of stool noted in the colon. Electronically Signed   By: Garald Balding M.D.   On: 10/21/2016 19:32      Scheduled Meds: . sodium chloride   Intravenous Once  . clotrimazole   Topical BID  . enoxaparin (LOVENOX) injection  40 mg Subcutaneous Q24H  . famotidine (PEPCID) IV  20 mg Intravenous Q12H  . polyethylene glycol  17 g Oral Daily   Continuous Infusions: . dextrose 5 % and 0.45 % NaCl with KCl 20 mEq/L 100 mL/hr at 10/22/16 1027   PRN Meds: acetaminophen **OR** acetaminophen, bisacodyl, LORazepam, morphine injection, ondansetron **OR** ondansetron (ZOFRAN) IV, phenol  Time spent: 30 minutes  Author: Berle Mull, MD [facility] Pager: [phone] 10/22/2016 2:57 PM  If 7PM-7AM, please contact night-coverage at [email or web address], password [facility]

## 2016-10-23 DIAGNOSIS — K56609 Unspecified intestinal obstruction, unspecified as to partial versus complete obstruction: Principal | ICD-10-CM

## 2016-10-23 NOTE — Discharge Instructions (Signed)
Follow with Kenn File, MD as needed  Please get a complete blood count and chemistry panel checked by your Primary MD at your next visit, and again as instructed by your Primary MD. Please get your medications reviewed and adjusted by your Primary MD.  Please request your Primary MD to go over all Hospital Tests and Procedure/Radiological results at the follow up, please get all Hospital records sent to your Prim MD by signing hospital release before you go home.  If you had Pneumonia of Lung problems at the Hospital: Please get a 2 view Chest X ray done in 6-8 weeks after hospital discharge or sooner if instructed by your Primary MD.  If you have Congestive Heart Failure: Please call your Cardiologist or Primary MD anytime you have any of the following symptoms:  1) 3 pound weight gain in 24 hours or 5 pounds in 1 week  2) shortness of breath, with or without a dry hacking cough  3) swelling in the hands, feet or stomach  4) if you have to sleep on extra pillows at night in order to breathe  Follow cardiac low salt diet and 1.5 lit/day fluid restriction.  If you have diabetes Accuchecks 4 times/day, Once in AM empty stomach and then before each meal. Log in all results and show them to your primary doctor at your next visit. If any glucose reading is under 80 or above 300 call your primary MD immediately.  If you have Seizure/Convulsions/Epilepsy: Please do not drive, operate heavy machinery, participate in activities at heights or participate in high speed sports until you have seen by Primary MD or a Neurologist and advised to do so again.  If you had Gastrointestinal Bleeding: Please ask your Primary MD to check a complete blood count within one week of discharge or at your next visit. Your endoscopic/colonoscopic biopsies that are pending at the time of discharge, will also need to followed by your Primary MD.  Get Medicines reviewed and adjusted. Please take all your  medications with you for your next visit with your Primary MD  Please request your Primary MD to go over all hospital tests and procedure/radiological results at the follow up, please ask your Primary MD to get all Hospital records sent to his/her office.  If you experience worsening of your admission symptoms, develop shortness of breath, life threatening emergency, suicidal or homicidal thoughts you must seek medical attention immediately by calling 911 or calling your MD immediately  if symptoms less severe.  You must read complete instructions/literature along with all the possible adverse reactions/side effects for all the Medicines you take and that have been prescribed to you. Take any new Medicines after you have completely understood and accpet all the possible adverse reactions/side effects.   Do not drive or operate heavy machinery when taking Pain medications.   Do not take more than prescribed Pain, Sleep and Anxiety Medications  Special Instructions: If you have smoked or chewed Tobacco  in the last 2 yrs please stop smoking, stop any regular Alcohol  and or any Recreational drug use.  Wear Seat belts while driving.  Please note You were cared for by a hospitalist during your hospital stay. If you have any questions about your discharge medications or the care you received while you were in the hospital after you are discharged, you can call the unit and asked to speak with the hospitalist on call if the hospitalist that took care of you is not available. Once you  are discharged, your primary care physician will handle any further medical issues. Please note that NO REFILLS for any discharge medications will be authorized once you are discharged, as it is imperative that you return to your primary care physician (or establish a relationship with a primary care physician if you do not have one) for your aftercare needs so that they can reassess your need for medications and monitor your  lab values.  You can reach the hospitalist office at phone [phone] or fax [phone]   If you do not have a primary care physician, you can call [phone] for a physician referral.  Activity: As tolerated with Full fall precautions use walker/cane & assistance as needed  Diet: regular  Disposition Home

## 2016-10-23 NOTE — Discharge Summary (Signed)
Physician Discharge Summary  Zakarie Girgenti [id number] DOB: September 30, 1971 DOA: 10/21/2016  PCP: Kenn File, MD  Admit date: 10/21/2016 Discharge date: 10/23/2016  Admitted From: home Disposition:  home  Recommendations for Outpatient Follow-up:  1. Follow up with PCP in 1-2 weeks   Home Health: none Equipment/Devices: none  Discharge Condition: stable CODE STATUS: Full code Diet recommendation: regular  HPI: Per Dr. Posey Pronto, Paul Parrish is a 46 y.o. male with Past medical history of Anxiety, depression, chronic fatigue syndrome. Presented with complains of sudden onset of abdominal pain started last night. Along with nausea which is progressively worsening overnight. Patient has been passing gas but it is progressively getting less and less. Patient had a 1 small smear of BM today. With this patient presented to ER at [facility] [place] [facility]. After initial evaluation patient was found to be having small bowel obstruction, NG tube was inserted and patient was brought to the [facility]. At the time of my evaluation patient denies any nausea continues to have some abdominal discomfort. No fever no chills reported. No prior surgical exposure. No recent change in medication. Prior to this episode patient denies any pending constipation. No significant weight loss reported.  Hospital Course: Discharge Diagnoses:  Principal Problem:   SBO (small bowel obstruction) Active Problems:   Depression   Hypogonadism male   Polycythemia, secondary   Anxiety   Psoriasis   Small bowel obstruction - patient was admitted to the hospital with SBO. CT scan showed a closed loop small bowel obstruction with mesenteric stranding. General surgery was consulted and followed patient will hospitalized. He had an NG tube placed, he was made NPO and treated conservatively. He improved with this, he had return of bowel function, his NG tube was discontinued and his diet was advanced. He was able to  tolerate a regular diet, small bowel obstruction has resolved, he was able to pass gas and have bowel movements and was discharged home in stable condition. No clear etiology for his SBO given no prior history of intra-abdominal surgeries or interventions.  Depression/anxiety  - resume home medications  Polycythemia - chronic, outpatient management Psoriasis - outpatient management   Discharge Instructions   Allergies as of 10/23/2016      Reactions   Amoxicillin Other (See Comments)   High fever and possible heart racing   Bactrim [sulfamethoxazole-trimethoprim] Other (See Comments)   Fever, body aches, chills   Penicillins Other (See Comments)   High fever Has patient had a PCN reaction causing immediate rash, facial/tongue/throat swelling, SOB or lightheadedness with hypotension: No Has patient had a PCN reaction causing severe rash involving mucus membranes or skin necrosis: No Has patient had a PCN reaction that required hospitalization No Has patient had a PCN reaction occurring within the last 10 years: No If all of the above answers are "NO", then may proceed with Cephalosporin use.      Medication List    TAKE these medications   acetaminophen 500 MG tablet Commonly known as:  TYLENOL Take 500-1,000 mg by mouth every 6 (six) hours as needed (for pain).   ALPRAZolam 1 MG tablet Commonly known as:  XANAX Take 1 mg by mouth 3 (three) times daily as needed for anxiety.   amphetamine-dextroamphetamine 20 MG 24 hr capsule Commonly known as:  ADDERALL XR Take 20 mg by mouth daily.   buPROPion 150 MG 12 hr tablet Commonly known as:  WELLBUTRIN SR Take 150 mg by mouth 2 (two) times daily.  FLUoxetine 20 MG capsule Commonly known as:  PROZAC Take 60 mg by mouth at bedtime.   gabapentin 300 MG capsule Commonly known as:  NEURONTIN 1 pill three times a day and 2 at night What changed:  how much to take  how to take this  when to take this  additional  instructions   sildenafil 20 MG tablet Commonly known as:  REVATIO TAKE 2-5 PILLS ONCE DAILY AS NEEDED   testosterone cypionate 200 MG/ML injection Commonly known as:  DEPOTESTOSTERONE CYPIONATE Inject 1 mL (200 mg total) into the muscle every 14 (fourteen) days.      Follow-up Information    Kenn File, MD Follow up.   Specialty:  Family Medicine Why:  As needed Contact information: [place] [address] [phone]          Allergies  Allergen Reactions  . Amoxicillin Other (See Comments)    High fever and possible heart racing  . Bactrim [Sulfamethoxazole-Trimethoprim] Other (See Comments)    Fever, body aches, chills  . Penicillins Other (See Comments)    High fever Has patient had a PCN reaction causing immediate rash, facial/tongue/throat swelling, SOB or lightheadedness with hypotension: No Has patient had a PCN reaction causing severe rash involving mucus membranes or skin necrosis: No Has patient had a PCN reaction that required hospitalization No Has patient had a PCN reaction occurring within the last 10 years: No If all of the above answers are "NO", then may proceed with Cephalosporin use.     Consultations: Gen. surgery   Procedures/Studies  Ct Abdomen Pelvis W Contrast  Result Date: 10/21/2016 CLINICAL DATA:  Periumbilical abdominal pain beginning 8 hours ago. Associated nausea up. EXAM: CT ABDOMEN AND PELVIS WITH CONTRAST TECHNIQUE: Multidetector CT imaging of the abdomen and pelvis was performed using the standard protocol following bolus administration of intravenous contrast. CONTRAST:  167mL ISOVUE-300 IOPAMIDOL (ISOVUE-300) INJECTION 61% COMPARISON:  10/25/2015 FINDINGS: Lower chest: Normal Hepatobiliary: Liver appears normal.  No calcified gallstones. Pancreas: Normal Spleen: Normal Adrenals/Urinary Tract: Adrenal glands are normal. Kidneys are normal. No cyst, mass, stone or hydronephrosis. Stomach/Bowel: Abnormal dilated fluid  and air-filled proximal small intestine beginning beyond the ligament of Treitz with normal caliber distal small intestine. Findings are consistent with a closed loop small bowel obstruction. There is an abnormal appearance at the written mesentery in that region in the central abdomen, with some stranding and distortion. Large intestine appears unremarkable. Vascular/Lymphatic: Normal Reproductive: Normal Other: No free fluid or air. Musculoskeletal: Ordinary lower lumbar degenerative changes. IMPRESSION: Segmental small bowel obstruction suggesting closed loop obstruction. Abnormal appearance of the mesentery at the root of that region, nonspecific. Electronically Signed   By: Nelson Chimes M.D.   On: 10/21/2016 06:49   Dg Chest Portable 1 View  Result Date: 10/21/2016 CLINICAL DATA:  Mid abdominal pain since this morning. Nasogastric tube placement. EXAM: PORTABLE CHEST 1 VIEW COMPARISON:  08/06/2015. FINDINGS: Poor inspiration. Associated vascular crowding. No gross change in a normal sized heart. Clear lungs. Nasogastric tube extending into the stomach, poorly visualized distally. Mild scoliosis. IMPRESSION: No acute abnormality. Electronically Signed   By: Claudie Revering M.D.   On: 10/21/2016 08:48   Dg Abd Portable 1v  Result Date: 10/21/2016 CLINICAL DATA:  Acute onset of central abdominal pain. Initial encounter. EXAM: PORTABLE ABDOMEN - 1 VIEW COMPARISON:  CT of the abdomen and pelvis performed earlier today at 6:11 a.m. FINDINGS: The visualized bowel gas pattern is unremarkable. Scattered air and stool  filled loops of colon are seen; no abnormal dilatation of small bowel loops is seen to suggest small bowel obstruction. No free intra-abdominal air is identified, though evaluation for free air is limited on a single supine view. The enteric tube is noted ending overlying the body of the stomach. The visualized osseous structures are within normal limits; the sacroiliac joints are unremarkable in  appearance. IMPRESSION: Unremarkable bowel gas pattern; no free intra-abdominal air seen. Moderate amount of stool noted in the colon. Electronically Signed   By: Garald Balding M.D.   On: 10/21/2016 19:32      Subjective: - no chest pain, shortness of breath, no abdominal pain, nausea or vomiting.    Discharge Exam: Vitals:   10/23/16 0443 10/23/16 1426  BP: 133/65 130/80  Pulse: 77 75  Resp: 19   Temp: 97.9 F (36.6 C) 98 F (36.7 C)   Vitals:   10/22/16 1347 10/22/16 2028 10/23/16 0443 10/23/16 1426  BP: (!) 143/90 140/80 133/65 130/80  Pulse: 72 70 77 75  Resp: 18 19 19    Temp: 97.5 F (36.4 C) 97.8 F (36.6 C) 97.9 F (36.6 C) 98 F (36.7 C)  TempSrc: Oral Oral Oral Oral  SpO2: 94% 95% 96% 98%  Weight:      Height:        General: Pt is alert, awake, not in acute distress Cardiovascular: RRR, S1/S2 +, no rubs, no gallops Respiratory: CTA bilaterally, no wheezing, no rhonchi Abdominal: Soft, NT, ND, bowel sounds + Extremities: no edema, no cyanosis    The results of significant diagnostics from this hospitalization (including imaging, microbiology, ancillary and laboratory) are listed below for reference.     Microbiology: No results found for this or any previous visit (from the past 240 hour(s)).   Labs: BNP (last 3 results) No results for input(s): BNP in the last 8760 hours. Basic Metabolic Panel:  Recent Labs Lab 10/21/16 0440 10/22/16 0448  NA 134* 134*  K 4.1 4.1  CL 99* 102  CO2 25 28  GLUCOSE 120* 98  BUN 11 10  CREATININE 1.22 1.19  CALCIUM 9.4 8.4*  MG  --  2.1   Liver Function Tests:  Recent Labs Lab 10/21/16 0440 10/22/16 0448  AST 28 23  ALT 31 22  ALKPHOS 75 56  BILITOT 0.8 0.7  PROT 7.8 6.4*  ALBUMIN 4.4 3.5    Recent Labs Lab 10/21/16 0440  LIPASE 23   No results for input(s): AMMONIA in the last 168 hours. CBC:  Recent Labs Lab 10/21/16 0440 10/22/16 0448  WBC 14.8* 8.3  NEUTROABS 11.4* 5.4  HGB  19.3* 16.5  HCT 54.2* 48.6  MCV 84.2 89.3  PLT 315 247   Cardiac Enzymes: No results for input(s): CKTOTAL, CKMB, CKMBINDEX, TROPONINI in the last 168 hours. BNP: Invalid input(s): POCBNP CBG: No results for input(s): GLUCAP in the last 168 hours. D-Dimer No results for input(s): DDIMER in the last 72 hours. Hgb A1c No results for input(s): HGBA1C in the last 72 hours. Lipid Profile No results for input(s): CHOL, HDL, LDLCALC, TRIG, CHOLHDL, LDLDIRECT in the last 72 hours. Thyroid function studies No results for input(s): TSH, T4TOTAL, T3FREE, THYROIDAB in the last 72 hours.  Invalid input(s): FREET3 Anemia work up No results for input(s): VITAMINB12, FOLATE, FERRITIN, TIBC, IRON, RETICCTPCT in the last 72 hours. Urinalysis    Component Value Date/Time   COLORURINE YELLOW 10/21/2016 0620   APPEARANCEUR CLEAR 10/21/2016 0620   LABSPEC 1.039 (H) 10/21/2016  [place] 8.0 10/21/2016 0620   GLUCOSEU NEGATIVE 10/21/2016 0620   HGBUR NEGATIVE 10/21/2016 0620   BILIRUBINUR NEGATIVE 10/21/2016 0620   KETONESUR NEGATIVE 10/21/2016 0620   PROTEINUR NEGATIVE 10/21/2016 0620   NITRITE NEGATIVE 10/21/2016 0620   LEUKOCYTESUR NEGATIVE 10/21/2016 0620   Sepsis Labs Invalid input(s): PROCALCITONIN,  WBC,  LACTICIDVEN Microbiology No results found for this or any previous visit (from the past 240 hour(s)).   Time coordinating discharge: Over 30 minutes  SIGNED:  Marzetta Board, MD  [facility] 10/23/2016, 4:15 PM Pager [phone]  If 7PM-7AM, please contact night-coverage [email or web address] Password [email or web address]

## 2016-10-28 ENCOUNTER — Encounter: Payer: Self-pay | Admitting: Family Medicine

## 2016-11-01 ENCOUNTER — Other Ambulatory Visit: Payer: Self-pay | Admitting: Family Medicine

## 2016-11-01 DIAGNOSIS — F32A Depression, unspecified: Secondary | ICD-10-CM

## 2016-11-01 DIAGNOSIS — D751 Secondary polycythemia: Secondary | ICD-10-CM

## 2016-11-01 DIAGNOSIS — F329 Major depressive disorder, single episode, unspecified: Secondary | ICD-10-CM

## 2016-11-01 DIAGNOSIS — E291 Testicular hypofunction: Secondary | ICD-10-CM

## 2016-11-01 NOTE — Telephone Encounter (Signed)
Last seen March 2017 - bradshaw - please address  If approved have nurse phone to [facility]

## 2016-11-08 ENCOUNTER — Encounter: Payer: Self-pay | Admitting: Family Medicine

## 2016-11-08 ENCOUNTER — Ambulatory Visit (INDEPENDENT_AMBULATORY_CARE_PROVIDER_SITE_OTHER): Payer: 59 | Admitting: Family Medicine

## 2016-11-08 VITALS — BP 129/89 | HR 87 | Temp 97.0°F | Ht 75.0 in | Wt 270.8 lb

## 2016-11-08 DIAGNOSIS — F419 Anxiety disorder, unspecified: Secondary | ICD-10-CM

## 2016-11-08 DIAGNOSIS — R5383 Other fatigue: Secondary | ICD-10-CM | POA: Diagnosis not present

## 2016-11-08 DIAGNOSIS — D751 Secondary polycythemia: Secondary | ICD-10-CM

## 2016-11-08 DIAGNOSIS — E291 Testicular hypofunction: Secondary | ICD-10-CM

## 2016-11-08 NOTE — Patient Instructions (Addendum)
Great to see you!  Come back in 3 months unless you need [place] sooner  Your provider wants you to schedule an appointment with a Psychologist/Psychiatrist. The following list of offices requires the patient to call and make their own appointment, as there is information they need that only you can provide. Please feel free to choose form the following providers:  [place] in [place]  [place]  [phone] [place], [place]  (Scheduled through [place]) Must call and do an interview for appointment. Sees Children / Accepts Medicaid  [facility] [place]  [address] [place]    [place], [place]  [phone] [place], [place] for Autism but does not treat it Sees Children / Accepts Medicaid  [facility]    [phone] [address] [place], [place] Medication management, substance abuse, bipolar, grief, family, marriage, OCD, anxiety, PTSD Sees children / Accepts Medicaid  [facility]    [phone] [address], [place], [place] children / Accepts [facility]  [facility]  [phone] [address] [place], [place]   Dr Lorenza Evangelist     [phone] [address], [place], [place]  Sees ADD & ADHD for treatment Accepts Medicaid  [facility]  [phone] Arlean Hopping, [place] for Autism Accepts [facility]  [facility]  [phone] [place], [place]  Does Adult ADD evaluations Does not accept Medicaid  [facility]   [phone] [place], [place] children as young as 20 years old Accepts [facility]     [phone]    [place], [place] [phone] Sees  children Accepts Medicaid

## 2016-11-08 NOTE — Progress Notes (Signed)
   HPI  Patient presents today here for follow up of chronic conditions.  Polycythemia, hypogonadism Patient's last testosterone injection was last week, he does not feel this is not very beneficial. Hgb has been measured as high as 20.8 when going to give blood, pt was giving blood as an alternative to therapeutic phlebotomy Pt understands why I am stopping testosterone.   Anxiety In complaints of severe anxiety and states that he has a difficult time being in public. He is not satisfied with his current psychiatrist and would like a referral to another psychiatrist.  Fatigue Pt feels this is mood related, requests blood work   PMH: Smoking status noted ROS: Per HPI  Objective: BP 129/89   Pulse 87   Temp 97 F (36.1 C) (Oral)   Ht '6\' 3"'$  (1.905 m)   Wt 270 lb 12.8 oz (122.8 kg)   BMI 33.85 kg/m  Gen: NAD, alert, cooperative with exam HEENT: NCAT CV: RRR, good S1/S2, no murmur Resp: CTABL, no wheezes, non-labored Ext: No edema, warm Neuro: Alert and oriented, No gross deficits  Assessment and plan:  # Polycythemia, secondary Secondary to testosterone treatment Discontinue testosterone, patient states that he's not sure this is beneficial in the first place. I discussed the dangers of polycythemia, especially hemoglobin over 20, and he understands why we are stopping it.  # Hypogonadism Stopping testosterone, see above   # Anxiety Severe, with agoraphobia and social anxiety Continue current medications, referred to psychiatry, I have given him a list of psychiatrists to call.  # fatigue Multifactorial Discussed likely underlying etiologies include mood, possible thyroid disorder labs    Orders Placed This Encounter  Procedures  . CBC with Differential/Platelet  . CMP14+EGFR  . TSH + free T4  . Ambulatory referral to Psychiatry    Referral Priority:   Routine    Referral Type:   Psychiatric    Referral Reason:   Specialty Services Required    Requested  Specialty:   Psychiatry    Number of Visits Requested:   [place], MD [facility] 11/08/2016, 5:16 PM

## 2016-11-09 LAB — CMP14+EGFR
ALK PHOS: 80 IU/L (ref 39–117)
ALT: 27 IU/L (ref 0–44)
AST: 21 IU/L (ref 0–40)
Albumin/Globulin Ratio: 1.8 (ref 1.2–2.2)
Albumin: 4.6 g/dL (ref 3.5–5.5)
BILIRUBIN TOTAL: 0.3 mg/dL (ref 0.0–1.2)
BUN/Creatinine Ratio: 7 — ABNORMAL LOW (ref 9–20)
BUN: 8 mg/dL (ref 6–24)
CHLORIDE: 95 mmol/L — AB (ref 96–106)
CO2: 24 mmol/L (ref 18–29)
Calcium: 9.5 mg/dL (ref 8.7–10.2)
Creatinine, Ser: 1.15 mg/dL (ref 0.76–1.27)
GFR calc Af Amer: 88 mL/min/{1.73_m2} (ref >59)
GFR calc non Af Amer: 76 mL/min/{1.73_m2} (ref >59)
GLUCOSE: 94 mg/dL (ref 65–99)
Globulin, Total: 2.6 g/dL (ref 1.5–4.5)
Potassium: 4.4 mmol/L (ref 3.5–5.2)
Sodium: 136 mmol/L (ref 134–144)
TOTAL PROTEIN: 7.2 g/dL (ref 6.0–8.5)

## 2016-11-09 LAB — CBC WITH DIFFERENTIAL/PLATELET
BASOS ABS: 0.1 10*3/uL (ref 0.0–0.2)
Basos: 1 %
EOS (ABSOLUTE): 0.1 10*3/uL (ref 0.0–0.4)
Eos: 1 %
Hematocrit: 52 % — ABNORMAL HIGH (ref 37.5–51.0)
Hemoglobin: 17.9 g/dL — ABNORMAL HIGH (ref 13.0–17.7)
IMMATURE GRANS (ABS): 0 10*3/uL (ref 0.0–0.1)
Immature Granulocytes: 0 %
LYMPHS: 29 %
Lymphocytes Absolute: 2.6 10*3/uL (ref 0.7–3.1)
MCH: 30.4 pg (ref 26.6–33.0)
MCHC: 34.4 g/dL (ref 31.5–35.7)
MCV: 88 fL (ref 79–97)
Monocytes Absolute: 0.9 10*3/uL (ref 0.1–0.9)
Monocytes: 11 %
NEUTROS ABS: 5 10*3/uL (ref 1.4–7.0)
NEUTROS PCT: 58 %
PLATELETS: 318 10*3/uL (ref 150–379)
RBC: 5.88 x10E6/uL — AB (ref 4.14–5.80)
RDW: 13.3 % (ref 12.3–15.4)
WBC: 8.7 10*3/uL (ref 3.4–10.8)

## 2016-11-09 LAB — TSH+FREE T4
Free T4: 0.98 ng/dL (ref 0.82–1.77)
TSH: 2.68 u[IU]/mL (ref 0.450–4.500)

## 2016-12-06 ENCOUNTER — Encounter: Payer: Self-pay | Admitting: Family Medicine

## 2016-12-06 DIAGNOSIS — E291 Testicular hypofunction: Secondary | ICD-10-CM

## 2016-12-09 NOTE — Addendum Note (Signed)
Addended by: Timmothy Euler on: 12/09/2016 02:03 PM   Modules accepted: Orders

## 2016-12-12 DIAGNOSIS — R5382 Chronic fatigue, unspecified: Secondary | ICD-10-CM | POA: Diagnosis not present

## 2016-12-30 ENCOUNTER — Encounter: Payer: Self-pay | Admitting: Family Medicine

## 2016-12-31 ENCOUNTER — Encounter: Payer: Self-pay | Admitting: Family Medicine

## 2017-01-06 ENCOUNTER — Ambulatory Visit (INDEPENDENT_AMBULATORY_CARE_PROVIDER_SITE_OTHER): Payer: 59 | Admitting: Psychiatry

## 2017-01-06 VITALS — BP 132/84 | HR 84 | Ht 75.0 in | Wt 264.6 lb

## 2017-01-06 DIAGNOSIS — F410 Panic disorder [episodic paroxysmal anxiety] without agoraphobia: Secondary | ICD-10-CM | POA: Diagnosis not present

## 2017-01-06 DIAGNOSIS — Z818 Family history of other mental and behavioral disorders: Secondary | ICD-10-CM

## 2017-01-06 DIAGNOSIS — Z79899 Other long term (current) drug therapy: Secondary | ICD-10-CM

## 2017-01-06 DIAGNOSIS — F331 Major depressive disorder, recurrent, moderate: Secondary | ICD-10-CM

## 2017-01-06 DIAGNOSIS — G4733 Obstructive sleep apnea (adult) (pediatric): Secondary | ICD-10-CM

## 2017-01-06 DIAGNOSIS — L409 Psoriasis, unspecified: Secondary | ICD-10-CM

## 2017-01-06 DIAGNOSIS — F411 Generalized anxiety disorder: Secondary | ICD-10-CM

## 2017-01-06 MED ORDER — ALPRAZOLAM 1 MG PO TABS: 1.0000 mg | ORAL_TABLET | Freq: Three times a day (TID) | ORAL | 0 refills | Status: DC | PRN

## 2017-01-06 MED ORDER — GABAPENTIN 300 MG PO CAPS: 600.0000 mg | ORAL_CAPSULE | Freq: Three times a day (TID) | ORAL | 11 refills | Status: DC

## 2017-01-06 MED ORDER — FLUOXETINE HCL 20 MG PO CAPS: 60.0000 mg | ORAL_CAPSULE | Freq: Every day | ORAL | 2 refills | Status: DC

## 2017-01-06 NOTE — Progress Notes (Signed)
Psychiatric Initial Adult Assessment   Patient Identification: Paul Parrish MRN:  [id number] Date of Evaluation:  01/06/2017 Referral Source: self, pcp Chief Complaint:   Visit Diagnosis:    ICD-9-CM ICD-10-CM   1. Psoriasis 696.1 L40.9 Ambulatory referral to Dermatology  2. Obstructive sleep apnea 327.23 G47.33 Polysomnography 4 or more parameters     Ambulatory referral to Sleep Studies     FLUoxetine (PROZAC) 20 MG capsule  3. Panic attacks 300.01 F41.0 FLUoxetine (PROZAC) 20 MG capsule     ALPRAZolam (XANAX) 1 MG tablet  4. GAD (generalized anxiety disorder) 300.02 F41.1 FLUoxetine (PROZAC) 20 MG capsule     ALPRAZolam (XANAX) 1 MG tablet  5. Moderate episode of recurrent major depressive disorder (HCC) 296.32 F33.1 ALPRAZolam (XANAX) 1 MG tablet   History of Present Illness:  Paul Parrish is a 46 year old male.  Patient arrives 20 minutes late for his initial visit.  I spent time with the patient reviewing his past psychiatric history.  He is switching psychiatric providers, as he feels that his current providers simply provide him with multiple drugs, but no active solutions.  He reports that he is struggled with anxiety, panic, and depression symptoms for the past 10-15 years. He reports that he has a significant biological family history of similar symptoms.  He has not had any episodes consistent with bipolar mania or psychosis.  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  He reports that he has an excellent childhood, and has a good relationship with his parents. No history of military service or trauma.  The patient is visibly tired during our initial intake, and he admits that he was quite sleepy on his drive here from [place]. He reports that he often has excessive daytime sleepiness, and he had been prescribed Adderall and Wellbutrin for these purposes.  He has not noticed any benefit from these. He continues to struggle with significant anxiety and panic, and feels that alprazolam does not help with his panic or anxiety symptoms. Viewing the patient's medical chart and his history, and it is concerning that he has been diagnosed with obstructive sleep apnea but does not currently have a CPAP machine. I spent time educating the patient about treatment resistant depression and obstructive sleep apnea. I spent time educating him about Wellbutrin and Adderall and how these may increase anxiety and panic. He reports that this makes sense given that he was never diagnosed with ADHD, and was told that Adderall can help with his energy levels.  Regarding his current pharmacology, I instructed the patient discontinue Adderall and discontinue Wellbutrin. Of note he has a history of childhood seizures and was on antiepileptic medications until he was 46 years old. I warned him against the use of Wellbutrin in people with seizure disorders. He has not had seizures since he was a teenager. I educated the patient on his diagnosis of anxiety and panic, and we agreed to continue Prozac and alprazolam at the current doses, and he is titrating gabapentin to 600 mg 3 times daily as needed for anxiety.  Of most concern, I encouraged the patient to get a repeat sleep study with a CPAP titration. I ordered a ambulatory referral to sleep study center, and a polysomnography at [facility]. In addition the patient has untreated unmanaged psoriasis, as he has not had the opportunity to find a new dermatologist as his most recent  dermatologist office changed insurances. I put in an ambulatory referral for dermatology as well.  Fill Date Product, Str, Form Qty Days Pt ID Prescriber Written RX# N/R* Pharm **MED+ ---------- -------------------------------- ------ ---- --------- ---------- ---------- ------------ ----- --------- ------ 12/30/2016 TESTOSTERONE CYP 200 MG/ML 2.00 28  [id number] 11/01/2016 [id number] R [id number] 00.0 12/21/2016 ALPRAZOLAM 1 MG TABLET 90.00 30 [id number] 12/16/2016 [id number] N [id number] 00.0 12/16/2016 ADDERALL XR 20 MG CAPSULE 30.00 30 [id number] 12/16/2016 [id number] N [id number] 00.0 11/22/2016 ALPRAZOLAM 1 MG TABLET 90.00 30 [id number] 10/11/2016 [id number] R [id number] 00.0 11/18/2016 ADDERALL XR 20 MG CAPSULE 30.00 30 [id number] 11/11/2016 [id number] N [id number] 00.0 11/12/2016 ACETAMINOPHEN-COD #3 TABLET 25.00 8 [id number] 11/12/2016 [id number] N [id number] 14.06 11/01/2016 TESTOSTERONE CYP 200 MG/ML 2.00 28 [id number] 11/01/2016 [id number] N [id number] 00.0 10/26/2016 ALPRAZOLAM 1 MG TABLET 90.00 30 [id number] 10/11/2016 [id number] N [id number] 00.0 10/14/2016 ADDERALL XR 20 MG CAPSULE 30.00 30 [id number] 10/12/2016 [id number] N [id number] 00.0 10/05/2016 TESTOSTERONE CYP 200 MG/ML 2.00 28 [id number] 05/03/2016 [id number] R [id number] 00.0 09/25/2016 ALPRAZOLAM 1 MG TABLET 90.00 30 [id number] 09/18/2016 [id number] N [id number] 00.0 09/18/2016 DEXTROAMP-AMPHETAMIN 20 MG TAB 30.00 30 [id number] 09/18/2016 [id number] N [id number] 00.0 09/09/2016 TESTOSTERONE CYP 200 MG/ML 2.00 28 [id number] 05/03/2016 [id number] R [id number] 00.0 08/28/2016 ALPRAZOLAM 1 MG TABLET 90.00 30 [id number] 08/16/2016 [id number] N [id number] 00.0 08/17/2016 DEXTROAMP-AMPHETAMIN 20 MG TAB 30.00 30 [id number] 08/17/2016 [id number] N [id number] 00.0 08/10/2016 TESTOSTERONE CYP 200 MG/ML 2.00 28 [id number] 05/03/2016 [id number] R [id number] 00.0 07/28/2016 ALPRAZOLAM 1 MG TABLET 90.00 30 [id number] 06/28/2016 [id number] R [id number] 00.0 07/13/2016 TESTOSTERONE CYP 200 MG/ML 2.00 28 [id number] 05/03/2016 [id number] R [id number] 00.0 07/01/2016 ALPRAZOLAM 1 MG TABLET 90.00 30 [id number] 06/28/2016 [id number] N [id number] 00.0 06/03/2016 ADDERALL XR 20 MG CAPSULE 30.00 30  [id number] 05/03/2016 [id number] N [id number] 00.0 06/01/2016 ALPRAZOLAM 1 MG TABLET 90.00 30 [id number] 05/03/2016 [id number] R [id number] 00.0 05/30/2016 TESTOSTERONE CYP 200 MG/ML 2.00 28 [id number] 05/03/2016 [id number] R [id number] 00.0 05/04/2016 ALPRAZOLAM 1 MG TABLET 90.00 30 [id number] 05/03/2016 [id number] N [id number] 00.0 05/04/2016 ADDERALL XR 20 MG CAPSULE 30.00 30 [id number] 05/03/2016 [id number] N [id number] 00.0 05/03/2016 TESTOSTERONE CYP 200 MG/ML 2.00 28 [id number] 05/03/2016 [id number] N [id number] 00.0 04/11/2016 LORAZEPAM 1 MG TABLET 90.00 30 [id number] 02/09/2016 [id number] R [id number] 00.0 04/07/2016 ADDERALL XR 20 MG CAPSULE 30.00 30 [id number] 02/09/2016 [id number] N [id number] 00.0 03/21/2016 TESTOSTERONE CYP 200 MG/ML 2.00 28 [id number] 11/02/2015 [id number] R [id number] 00.0 03/14/2016 LORAZEPAM 1 MG TABLET 90.00 30 [id number] 03/08/2016 [id number] N [id number] 00.0 03/08/2016 ADDERALL XR 20 MG CAPSULE 30.00 30 [id number] 03/08/2016 [id number] N [id number] 00.0 03/01/2016 HYDROCODONE-ACETAMIN 5-325 MG 25.00 4 [id number] 03/01/2016 [id number] N [id number] 31.25 02/18/2016 TESTOSTERONE CYP 200 MG/ML 2.00 28 [id number] 11/02/2015 [id number] R [id number] 00.0 02/14/2016 LORAZEPAM 1 MG TABLET 90.00 30 [id number] 02/09/2016 [id number] N [id number] 00.0 02/09/2016 ADDERALL XR 20 MG CAPSULE 30.00 30 [id number] 02/09/2016 [id number] N [id number] 00.0 01/23/2016 HYDROCODONE-HOMATROPINE SYRUP 120.00 8 [id number] 01/23/2016 [id number] N [id number] 00.0 01/22/2016 TESTOSTERONE CYP 200 MG/ML 2.00 28 [id number] 11/02/2015 [id number] R [id number] 00.0 01/18/2016 VIRTUSSIN AC LIQUID 180.00 9 [id number] 01/18/2016 [id number] N [id number] 00.0 01/17/2016 LORAZEPAM 1 MG TABLET 90.00 30 [id number] 01/17/2016 [id number] N [id number] 00.0 01/17/2016 DEXTROAMP-AMPHETAMIN 10 MG TAB 30.00 30  [id number] 01/17/2016 [id number] N [id number] 00.0 *N/R N=New R=Refill +MED Daily Prescribers for  prescriptions listed ---------------------------------------------------------------------------------------------------------------------------------- [id number] Sandrea Hammond; [place], [place], [place] [address] [id number] Breck Coons NP; [facility] [facility], [place], [place] [address] [id number] [place]; [place], [place] [employer], [place] [address] [id number] Evelina Dun, A; [place], [place], [place] [address] [id number] Cecil Cranker, MD; [place], [facility], [address] [place] [address] [id number] ARMSTRONG, ANN, L; [address] [place], [place] [address] Pharmacies that dispensed prescriptions listed ---------------------------------------------------------------------------------------------------------------------------------- [id number] [place], L.L.C.; DBA: [facility] [id number], [place], [place] [address], Patients that match search criteria ---------------------------------------------------------------------------------------------------------------------------------- [id number] [facility] [place], DOB [birth year]; [place], [place] [address] [id number] [place], DOB [birth year]; [place], [place] [address] [id number] [place], DOB [birth year]; [place], [place] [address] [id number] [place], DOB [birth year]; [place], [place] [address] MED Summary This section displays cumulative MED values by unique recipient. The MED Max value is the maximum occurrence of cumulative MED sustained for any 3 consecutive days. This value is calculated based on prescriptions dispensed during the date range  requested. ----------------------------------------------------------------------------------------------------------------------------------- 31.25 [place], [place]; 1971-05-21; [place], [place] [address]  Associated Signs/Symptoms: Depression Symptoms:  depressed mood, hypersomnia, psychomotor retardation, feelings of worthlessness/guilt, difficulty concentrating, impaired memory, anxiety, panic attacks, loss of energy/fatigue, weight gain, (Hypo) Manic Symptoms:  none Anxiety Symptoms:  Excessive Worry, Panic Symptoms, Social Anxiety, Psychotic Symptoms:  none PTSD Symptoms: Negative  Past Psychiatric History: No psychiatric hospitalization. Has a history of outpatient management with neuropsychiatric Associates  Previous Psychotropic Medications: Yes   Substance Abuse History in the last 12 months:  No. He has a history of alcohol use disorder in remission since 2014  Consequences of Substance Abuse: Negative  Past Medical History:  Past Medical History:  Diagnosis Date  . Anxiety   . Depression   . Elevated hemoglobin ([place])   . Low testosterone   . Small bowel obstruction 10/21/2016    Past Surgical History:  Procedure Laterality Date  . NASAL SINUS SURGERY  02/2016    Family Psychiatric History: Family psychiatric history of anxiety and panic attacks  Family History:  Family History  Problem Relation Age of Onset  . Cancer Mother     kidney  . Hypertension Father   . Diabetes Father   . Diabetes Brother   . Cancer Paternal Grandmother     Social History:   Social History   Social History  . Marital status: Married    Spouse name: N/A  . Number of children: N/A  . Years of education: N/A   Social History Main Topics  . Smoking status: Never Smoker  . Smokeless tobacco: Never Used  . Alcohol use Yes     Comment: 10/22/2016 "nothing in over 1 year"  . Drug use: No  . Sexual activity: Yes   Other Topics Concern  . Not on file   Social  History Narrative  . No narrative on file    Additional Social History: Lives in [place], works in IT department at [employer]:   Allergies  Allergen Reactions  . Amoxicillin Other (See Comments)    High fever and possible heart racing  . Bactrim [Sulfamethoxazole-Trimethoprim] Other (See Comments)    Fever, body aches, chills  . Penicillins Other (See Comments)    High fever Has patient had a PCN  reaction causing immediate rash, facial/tongue/throat swelling, SOB or lightheadedness with hypotension: No Has patient had a PCN reaction causing severe rash involving mucus membranes or skin necrosis: No Has patient had a PCN reaction that required hospitalization No Has patient had a PCN reaction occurring within the last 10 years: No If all of the above answers are "NO", then may proceed with Cephalosporin use.     Metabolic Disorder Labs: Lab Results  Component Value Date   HGBA1C 4.9% 08/13/2013   Lab Results  Component Value Date   PROLACTIN 14.5 09/08/2015   PROLACTIN 16.4 (H) 06/14/2015   Lab Results  Component Value Date   CHOL 221 (H) 06/15/2016   TRIG 390 (H) 06/15/2016   HDL 25 (L) 06/15/2016   CHOLHDL 8.8 (H) 06/15/2016   LDLCALC 118 (H) 06/15/2016   LDLCALC 111 (H) 05/18/2014     Current Medications: Current Outpatient Prescriptions  Medication Sig Dispense Refill  . ALPRAZolam (XANAX) 1 MG tablet Take 1 tablet (1 mg total) by mouth 3 (three) times daily as needed for anxiety. 90 tablet 0  . FLUoxetine (PROZAC) 20 MG capsule Take 3 capsules (60 mg total) by mouth daily. 90 capsule 2  . gabapentin (NEURONTIN) 300 MG capsule Take 2 capsules (600 mg total) by mouth 3 (three) times daily. 180 capsule 11  . sildenafil (REVATIO) 20 MG tablet TAKE 2-5 PILLS ONCE DAILY AS NEEDED 30 tablet 1  . testosterone cypionate (DEPOTESTOSTERONE CYPIONATE) 200 MG/ML injection      No current facility-administered medications for this visit.      Neurologic: Headache: Negative Seizure: Negative historical seizures, on AE ages 33-17 Paresthesias:Negative  Musculoskeletal: Strength & Muscle Tone: within normal limits Gait & Station: normal Patient leans: N/A  Psychiatric Specialty Exam: Review of Systems  Constitutional: Negative.   HENT: Negative.   Respiratory: Negative.   Cardiovascular: Negative.   Gastrointestinal: Negative.   Neurological: Negative.   Psychiatric/Behavioral: Positive for depression and memory loss. The patient is nervous/anxious.     Blood pressure 132/84, pulse 84, height 6\' 3"  (1.905 m), weight 264 lb 9.6 oz (120 kg).Body mass index is 33.07 kg/m.  General Appearance: Casual and Fairly Groomed  Eye Contact:  Good  Speech:  Clear and Coherent  Volume:  Normal  Mood:  Dysphoric  Affect:  Congruent and Flat  Thought Process:  Goal Directed  Orientation:  Full (Time, Place, and Person)  Thought Content:  Logical  Suicidal Thoughts:  No  Homicidal Thoughts:  No  Memory:  Immediate;   Fair  Judgement:  Fair  Insight:  Fair  Psychomotor Activity:  Normal  Concentration:  Concentration: Poor and Attention Span: Poor  Recall:  NA  Fund of Knowledge:Fair  Language: Good  Akathisia:  Negative  Handed:  Right  AIMS (if indicated):  na  Assets:  Communication Skills Desire for Improvement Financial Resources/Insurance Housing Social Support Transportation  ADL's:  Intact  Cognition: WNL  Sleep:  10 hours    Treatment Plan Summary: Paul Parrish is a 46 year old male with a psychiatric history of anxiety and panic who presents today for psychiatric intake assessment. The patient appears to be on a multitude of medications, to increase his energy levels, in the setting of what appears to be untreated sleep apnea.  He has a sleep study from 4 years ago with an AHI during REM sleep in the 30s. I'm sending him for repeat sleep study, with recommended CPAP titration. He continues on Prozac and  alprazolam prn, but  I've discontinued his Wellbutrin and Adderall, as he continues to have panic attacks daily, and continues to be very anxious and paranoid in public places. I suspect that these stimulating medications are worsening his mood at this time.  I suspect that significant underlying cause of his excessive daytime sleepiness, fatigue, depression is his untreated sleep apnea.  This contributes to treatment resistance to response to SSRI therapies. Will proceed as below and follow up in 6-8 weeks.  He does not present any acute safety issues at this time.  1. Psoriasis   2. Obstructive sleep apnea   3. Panic attacks   4. GAD (generalized anxiety disorder)   5. Moderate episode of recurrent major depressive disorder (HCC)    Discontinue Adderall and discontinue Wellbutrin given ongoing anxiety, panic and history of seizures Continue Prozac 60 mg daily Continue alprazolam 1 mg 3 times a day when necessary for anxiety or panic Continue gabapentin 600 mg 3 times daily for anxiety and alcohol cessation Referral for dermatology Referral for sleep study center polysomnography ordered with CPAP titration Follow-up in 6 weeks  Aundra Dubin, MD 4/9/20185:37 PM

## 2017-01-06 NOTE — Patient Instructions (Addendum)
[facility] at [facility] [address] [place] [address], [place] [address]  Get Driving Directions  Main: [phone]   STOP Adderall  STOP Wellbutrin  Please call the sleep clinic above to schedule a visit as soon as possible  Increase Gabapentin to 2 tablets 3 times a day

## 2017-01-13 ENCOUNTER — Telehealth (HOSPITAL_COMMUNITY): Payer: Self-pay

## 2017-01-13 NOTE — Telephone Encounter (Signed)
Patient is calling to let you know he started taking his Adderall again, patient states that when he stopped taking id he became extremely tired, he had to work from home some because he was having issues with falling asleep. Please review and advise, thank you

## 2017-01-14 ENCOUNTER — Encounter (HOSPITAL_COMMUNITY): Payer: Self-pay | Admitting: Psychiatry

## 2017-01-14 NOTE — Telephone Encounter (Signed)
I called patient and discussed with him taking the Xanax as a 1/2 tab, and to not take it unless he is having anxiety. I also told him he should get his vitamin D levels checked - patients frequently get low vitamin D during the winter, he was willing to do this . Patient also believes he has sleep apnea and has an appointment set up at the sleep center. I let him know that the Adderall will not be refilled and patient agreed to stop taking again. He will call back if he has any questions or problems.

## 2017-01-14 NOTE — Telephone Encounter (Signed)
He should cut down on his dose of xanax, rather than starting adderall back.  He can decrease to 1/2 tablet of xanax 3 times daily instead of the whole tablet. I am not going to continue adderall. Thank you!

## 2017-01-16 ENCOUNTER — Other Ambulatory Visit (HOSPITAL_COMMUNITY): Payer: Self-pay | Admitting: Psychiatry

## 2017-01-16 ENCOUNTER — Ambulatory Visit: Payer: 59 | Admitting: "Endocrinology

## 2017-01-16 MED ORDER — HYDROXYZINE PAMOATE 25 MG PO CAPS: 25.0000 mg | ORAL_CAPSULE | Freq: Three times a day (TID) | ORAL | 1 refills | Status: DC | PRN

## 2017-01-17 ENCOUNTER — Other Ambulatory Visit (HOSPITAL_COMMUNITY): Payer: Self-pay

## 2017-01-17 MED ORDER — HYDROXYZINE PAMOATE 25 MG PO CAPS: 25.0000 mg | ORAL_CAPSULE | Freq: Three times a day (TID) | ORAL | 1 refills | Status: DC | PRN

## 2017-01-17 NOTE — Telephone Encounter (Signed)
error 

## 2017-02-05 ENCOUNTER — Encounter (HOSPITAL_COMMUNITY): Payer: Self-pay | Admitting: Psychiatry

## 2017-02-05 ENCOUNTER — Other Ambulatory Visit (HOSPITAL_COMMUNITY): Payer: Self-pay | Admitting: Psychiatry

## 2017-02-06 ENCOUNTER — Ambulatory Visit (INDEPENDENT_AMBULATORY_CARE_PROVIDER_SITE_OTHER): Payer: 59 | Admitting: "Endocrinology

## 2017-02-06 ENCOUNTER — Encounter: Payer: Self-pay | Admitting: "Endocrinology

## 2017-02-06 VITALS — BP 134/85 | HR 112 | Ht 75.0 in | Wt 267.0 lb

## 2017-02-06 DIAGNOSIS — Z6833 Body mass index (BMI) 33.0-33.9, adult: Secondary | ICD-10-CM

## 2017-02-06 DIAGNOSIS — D751 Secondary polycythemia: Secondary | ICD-10-CM | POA: Diagnosis not present

## 2017-02-06 DIAGNOSIS — E6609 Other obesity due to excess calories: Secondary | ICD-10-CM | POA: Diagnosis not present

## 2017-02-06 DIAGNOSIS — E291 Testicular hypofunction: Secondary | ICD-10-CM | POA: Diagnosis not present

## 2017-02-06 DIAGNOSIS — Z6835 Body mass index (BMI) 35.0-35.9, adult: Secondary | ICD-10-CM | POA: Insufficient documentation

## 2017-02-06 DIAGNOSIS — E669 Obesity, unspecified: Secondary | ICD-10-CM | POA: Insufficient documentation

## 2017-02-06 MED ORDER — TESTOSTERONE CYPIONATE 200 MG/ML IM SOLN: 100.0000 mg | INTRAMUSCULAR | 0 refills | Status: DC

## 2017-02-06 NOTE — Progress Notes (Signed)
HPI: Paul Parrish is a 46 y.o.-year-old man, referred by his PCP, Dr. Wendi Snipes, for evaluation and management of low testosterone. - He was diagnosed with hypogonadism in 2014, etiology not clear. - He denies any significant history of testicular injury, infection, radiation, systemic chemotherapy, use of androgen's/steroids, and opioids. -He reports history of seizures as a young man until his teenage years. -He denies any history of head injury. -He was initiated on testosterone replacement currently on 200 mg IM every 2 weeks. -Over the years, he was observed to have secondary polycythemia for which he is undergoing periodic phlebotomy. -His most recent labs show increased levels of red blood cells, hemoglobin, and hematocrit. - He also is currently undergoing workup for sleep apnea, reports snoring as a symptom. - He fathers 2 teenage boys biologically. -In September 2016 he did have mildly elevated prolactin. - He denies low libido, however reports chronic fatigue. Has difficulty obtaining or maintaining an erection, currently on sildenafil 20 mg when necessary.  No h/o of mumps orchitis/h/o autoimmune disorders. No h/o cryptorchidism. He grew and went through puberty like his peers. No shrinking of testes. No very small testes (<5 ml). No incomplete/delayed sexual development.     No breast discomfort/gynecomastia.    No loss of body hair (axillary/pubic)/decreased need for shaving. No abnormal sense of smell (only allergies). No hot flushes. No vision problems. No report of changing headaches. No FH of hypogonadism/infertility . - He has steady weight lately. No chronic diseases. - He has significant mood disorder on multiple antidepressants and antianxiety. No herbal medicines. -He denies family history of premature coronary artery disease.   ROS: Constitutional: no weight gain/loss, + fatigue, no subjective hyperthermia/hypothermia Eyes: no blurry vision, no  xerophthalmia ENT: no sore throat, no nodules palpated in throat, no dysphagia/odynophagia, no hoarseness Cardiovascular: no CP/SOB/palpitations/leg swelling Respiratory:  + Snoring, no cough/SOB Gastrointestinal: no N/V/D/C Musculoskeletal: no muscle/joint aches Skin: no rashes Neurological: no tremors/numbness/tingling/dizziness Psychiatric: + depression, +anxiety  PE: BP 134/85   Pulse (!) 112   Ht 6' 3"  (1.905 m)   Wt 267 lb (121.1 kg)   BMI 33.37 kg/m  Wt Readings from Last 3 Encounters:  02/06/17 267 lb (121.1 kg)  11/08/16 270 lb 12.8 oz (122.8 kg)  10/21/16 270 lb (122.5 kg)   Constitutional: Obese, in NAD Eyes: PERRLA, EOMI, no exophthalmos ENT: moist mucous membranes, no thyromegaly, no cervical lymphadenopathy Cardiovascular: RRR, No MRG Respiratory: CTA B Gastrointestinal: abdomen soft, NT, ND, BS+ Musculoskeletal: no deformities, strength intact in all 4 Skin: moist, warm, diffuse psoriatic  Rashes on bilateral upper extremities . Neurological: no tremor with outstretched hands, DTR normal in all 4 Genital exam: normal male escutcheon, no inguinal LAD, normal phallus, testes ~25 mL bilaterally, no testicular masses, circumcised male, no penile discharge.  No gynecomastia.  Results for Paul, Parrish (MRN [id number]) as of 02/06/2017 09:54  Ref. Range 09/08/2015 09:58 06/15/2016 08:20 10/21/2016 04:40 10/22/2016 04:48 11/08/2016 17:09  Prolactin Latest Ref Range: 4.0 - 15.2 ng/mL 14.5      Testosterone Latest Ref Range: 264 - 916 ng/dL 369 418     Comment, Testosterone Unknown Comment      TSH Latest Ref Range: 0.450 - 4.500 uIU/mL     2.680  T4,Free(Direct) Latest Ref Range: 0.82 - 1.77 ng/dL     0.98  Glucose Latest Ref Range: 65 - 99 mg/dL  83 120 (H) 98 94     Recent Results (from the past 2160 hour(s))  CBC with Differential/Platelet  Status: Abnormal   Collection Time: 11/08/16  5:09 PM  Result Value Ref Range   WBC 8.7 3.4 - 10.8 x10E3/uL   RBC 5.88 (H)  4.14 - 5.80 x10E6/uL   Hemoglobin 17.9 (H) 13.0 - 17.7 g/dL   Hematocrit 52.0 (H) 37.5 - 51.0 %   MCV 88 79 - 97 fL   MCH 30.4 26.6 - 33.0 pg   MCHC 34.4 31.5 - 35.7 g/dL   RDW 13.3 12.3 - 15.4 %   Platelets 318 150 - 379 x10E3/uL   Neutrophils 58 Not Estab. %   Lymphs 29 Not Estab. %   Monocytes 11 Not Estab. %   Eos 1 Not Estab. %   Basos 1 Not Estab. %   Neutrophils Absolute 5.0 1.4 - 7.0 x10E3/uL   Lymphocytes Absolute 2.6 0.7 - 3.1 x10E3/uL   Monocytes Absolute 0.9 0.1 - 0.9 x10E3/uL   EOS (ABSOLUTE) 0.1 0.0 - 0.4 x10E3/uL   Basophils Absolute 0.1 0.0 - 0.2 x10E3/uL   Immature Granulocytes 0 Not Estab. %   Immature Grans (Abs) 0.0 0.0 - 0.1 x10E3/uL  CMP14+EGFR     Status: Abnormal   Collection Time: 11/08/16  5:09 PM  Result Value Ref Range   Glucose 94 65 - 99 mg/dL   BUN 8 6 - 24 mg/dL   Creatinine, Ser 1.15 0.76 - 1.27 mg/dL   GFR calc non Af Amer 76 >59 mL/min/1.73   GFR calc Af Amer 88 >59 mL/min/1.73   BUN/Creatinine Ratio 7 (L) 9 - 20   Sodium 136 134 - 144 mmol/L   Potassium 4.4 3.5 - 5.2 mmol/L   Chloride 95 (L) 96 - 106 mmol/L   CO2 24 18 - 29 mmol/L   Calcium 9.5 8.7 - 10.2 mg/dL   Total Protein 7.2 6.0 - 8.5 g/dL   Albumin 4.6 3.5 - 5.5 g/dL   Globulin, Total 2.6 1.5 - 4.5 g/dL   Albumin/Globulin Ratio 1.8 1.2 - 2.2   Bilirubin Total 0.3 0.0 - 1.2 mg/dL   Alkaline Phosphatase 80 39 - 117 IU/L   AST 21 0 - 40 IU/L   ALT 27 0 - 44 IU/L  TSH + free T4     Status: None   Collection Time: 11/08/16  5:09 PM  Result Value Ref Range   TSH 2.680 0.450 - 4.500 uIU/mL   Free T4 0.98 0.82 - 1.77 ng/dL      ASSESSMENT: 1.  Hypogonadism  2. Erectile Dysfunction  PLAN:  1. Hypogonadism  I have reviewed his available records and clinically evaluated the patient. He has hypogonadism, etiology unclear at this time. - He did have inappropriately normal gonadotropins and mildly elevated prolactin level in 2016- which may indicate secondary cause of  hypogonadism. - I have discussed diagnosis of hypogonadism, safe use of testosterone,   and relevant workup of hypogonadism with him.  -  I had a long discussion with the patient regarding his testosterone results, which we reviewed together.  I  discussed adverse effects of unnecessary testosterone replacement short-term and long-term- which is evident already causing secondary polycythemia and in relation to his sleep apnea. It   might even be harmful as per the latest studies that showed increased cardiac risk with testosterone supplementation.  - His main complaint of fatigue could be the result of sleep apnea which indirectly could be made worse by supraphysiologic testosterone, and would not improve with higher dose of testosterone.  - This will improve with measures targeted to  improving his sleep, diet and exercise .  - We  discussed about controlling his diet (advised him to cut down on processed carbohydrates including soda and sweet tea,  increase the use of fruits and vegetables). We set a goal of  losing 10% body weight,  which is about 25 pounds, in 1-2 years time.  - I have advised him to lower testosterone to 100 mg intramuscular every other week and repeat levels in 3 months with office visit. - I will include prolactin, thyroid function test along with his next labs. -If  Prolactin level remains high, he may need pituitary/sella imaging, to see if it is secondary  hypogonadism..  2. Erectile dysfunction - This is most likely not caused by the decreased testosterone, since his last testosterone level from September 2017 was in target range- 418. Also, erection is a function of vascular health of the corpora cavernosa, discussed with patient.  - I believe the above measures will improve his erections, but he  may continue to benefit from the PDE5 inhibitor- sildenafil 20 mg by mouth as needed which he is already taking.   Glade Lloyd, MD Phone: [phone]  Fax: [phone]    02/06/2017, 10:41 AM

## 2017-02-10 ENCOUNTER — Other Ambulatory Visit (HOSPITAL_COMMUNITY): Payer: Self-pay | Admitting: Psychiatry

## 2017-02-20 ENCOUNTER — Ambulatory Visit (INDEPENDENT_AMBULATORY_CARE_PROVIDER_SITE_OTHER): Payer: 59 | Admitting: Psychiatry

## 2017-02-20 ENCOUNTER — Encounter (HOSPITAL_COMMUNITY): Payer: Self-pay | Admitting: Psychiatry

## 2017-02-20 DIAGNOSIS — F331 Major depressive disorder, recurrent, moderate: Secondary | ICD-10-CM | POA: Diagnosis not present

## 2017-02-20 DIAGNOSIS — G4733 Obstructive sleep apnea (adult) (pediatric): Secondary | ICD-10-CM

## 2017-02-20 DIAGNOSIS — F411 Generalized anxiety disorder: Secondary | ICD-10-CM | POA: Diagnosis not present

## 2017-02-20 DIAGNOSIS — F410 Panic disorder [episodic paroxysmal anxiety] without agoraphobia: Secondary | ICD-10-CM | POA: Diagnosis not present

## 2017-02-20 MED ORDER — ALPRAZOLAM 0.5 MG PO TABS: 0.5000 mg | ORAL_TABLET | Freq: Two times a day (BID) | ORAL | 1 refills | Status: DC | PRN

## 2017-02-20 MED ORDER — AMPHETAMINE-DEXTROAMPHET ER 20 MG PO CP24: 20.0000 mg | ORAL_CAPSULE | Freq: Every day | ORAL | 0 refills | Status: DC

## 2017-02-20 MED ORDER — HYDROXYZINE PAMOATE 25 MG PO CAPS: 25.0000 mg | ORAL_CAPSULE | Freq: Three times a day (TID) | ORAL | 1 refills | Status: DC | PRN

## 2017-02-20 MED ORDER — FLUOXETINE HCL 20 MG PO CAPS: 60.0000 mg | ORAL_CAPSULE | Freq: Every day | ORAL | 2 refills | Status: DC

## 2017-02-20 MED ORDER — LAMOTRIGINE 25 MG PO TABS: 25.0000 mg | ORAL_TABLET | Freq: Every day | ORAL | 1 refills | Status: DC

## 2017-02-20 NOTE — Progress Notes (Signed)
[place] MD/PA/NP OP Progress Note  02/20/2017 2:09 PM Paul Parrish  MRN:  [id number]  Chief Complaint:  Chief Complaint    Follow-up     Subjective:  Paul Parrish presents today for psychiatric follow-up.  We have had multiple mitral communications. He has done a nice job tapering his Xanax from 1 mg 3 times daily to 0.5 mg twice daily. I applauded these efforts, and we agreed to continue gradually tapering over time, with the patient taking half of a 0.5 mg tablet during periods where he is not quite as anxious.  He reports that using the Vistaril 3 times a day also helps. He continues to struggle with depressed mood, largely in relation to the very heartbreaking circumstances that his family is going through. His stepdaughter has had a recurrence of her Wilms tumor, she is age 21, and it is very likely that she will be passing, as the cancer has progressed to stage IV.  I spent time expressing my grief and sadness for the patient and his family, and they are being set up with hospice and palliative care for family support. We discussed augmenting Prozac with Lamictal. We reviewed the risks and benefits of Lamictal, particularly SJS, and I cautioned the patient to discontinue this medicine immediately if he notices any skin rash that is different from psoriasis, or a worsening of his typical psoriatic rash.  He denies any suicidal thoughts, and is hopeful that Lamictal will help his mood. We agreed to continue Adderall 20 mg XR for now, as this is helping him be able to function in his job, and be able to focus.    He is scheduled for sleep study, and I encouraged him to reach out to the sleep clinic, to let them know that he has received this letter of declining coverage for his polysomnography. He agrees to do so, and will Biochemist, clinical know how this goes.  He reports that he is not drink alcohol at all, and even though things of been stressful with his family, he reports that he is just focused on being  there is a good father and husband, and he has not had any alcohol cravings.  Visit Diagnosis:    ICD-9-CM ICD-10-CM   1. Panic attacks 300.01 F41.0 ALPRAZolam (XANAX) 0.5 MG tablet     amphetamine-dextroamphetamine (ADDERALL XR) 20 MG 24 hr capsule     FLUoxetine (PROZAC) 20 MG capsule     hydrOXYzine (VISTARIL) 25 MG capsule  2. GAD (generalized anxiety disorder) 300.02 F41.1 ALPRAZolam (XANAX) 0.5 MG tablet     amphetamine-dextroamphetamine (ADDERALL XR) 20 MG 24 hr capsule     FLUoxetine (PROZAC) 20 MG capsule     lamoTRIgine (LAMICTAL) 25 MG tablet     hydrOXYzine (VISTARIL) 25 MG capsule  3. Moderate episode of recurrent major depressive disorder (HCC) 296.32 F33.1 ALPRAZolam (XANAX) 0.5 MG tablet     amphetamine-dextroamphetamine (ADDERALL XR) 20 MG 24 hr capsule     lamoTRIgine (LAMICTAL) 25 MG tablet  4. Obstructive sleep apnea 327.23 G47.33 FLUoxetine (PROZAC) 20 MG capsule    Past Psychiatric History: See intake H&P for full details. Reviewed, with no updates at this time.   Past Medical History:  Past Medical History:  Diagnosis Date  . Anxiety   . Depression   . Elevated hemoglobin ([place])   . Low testosterone   . Small bowel obstruction ([place]) 10/21/2016    Past Surgical History:  Procedure Laterality Date  . NASAL SINUS SURGERY  02/2016  Family Psychiatric History: See intake H&P for full details. Reviewed, with no updates at this time.   Family History:  Family History  Problem Relation Age of Onset  . Cancer Mother        kidney  . Hypertension Father   . Diabetes Father   . Diabetes Brother   . Cancer Paternal Grandmother     Social History:  Social History   Social History  . Marital status: Married    Spouse name: N/A  . Number of children: N/A  . Years of education: N/A   Social History Main Topics  . Smoking status: Never Smoker  . Smokeless tobacco: Never Used  . Alcohol use Yes     Comment: 10/22/2016 "nothing in over 1 year"  .  Drug use: No  . Sexual activity: Yes    Partners: Female    Birth control/ protection: None   Other Topics Concern  . None   Social History Narrative  . None    Allergies:  Allergies  Allergen Reactions  . Amoxicillin Other (See Comments)    High fever and possible heart racing  . Bactrim [Sulfamethoxazole-Trimethoprim] Other (See Comments)    Fever, body aches, chills  . Penicillins Other (See Comments)    High fever Has patient had a PCN reaction causing immediate rash, facial/tongue/throat swelling, SOB or lightheadedness with hypotension: No Has patient had a PCN reaction causing severe rash involving mucus membranes or skin necrosis: No Has patient had a PCN reaction that required hospitalization No Has patient had a PCN reaction occurring within the last 10 years: No If all of the above answers are "NO", then may proceed with Cephalosporin use.     Metabolic Disorder Labs: Lab Results  Component Value Date   HGBA1C 4.9% 08/13/2013   Lab Results  Component Value Date   PROLACTIN 14.5 09/08/2015   PROLACTIN 16.4 (H) 06/14/2015   Lab Results  Component Value Date   CHOL 221 (H) 06/15/2016   TRIG 390 (H) 06/15/2016   HDL 25 (L) 06/15/2016   CHOLHDL 8.8 (H) 06/15/2016   LDLCALC 118 (H) 06/15/2016   LDLCALC 111 (H) 05/18/2014     Current Medications: Current Outpatient Prescriptions  Medication Sig Dispense Refill  . ALPRAZolam (XANAX) 0.5 MG tablet Take 1 tablet (0.5 mg total) by mouth 2 (two) times daily as needed for anxiety. 60 tablet 1  . FLUoxetine (PROZAC) 20 MG capsule Take 3 capsules (60 mg total) by mouth daily. 90 capsule 2  . gabapentin (NEURONTIN) 300 MG capsule Take 2 capsules (600 mg total) by mouth 3 (three) times daily. 180 capsule 11  . hydrOXYzine (VISTARIL) 25 MG capsule Take 1 capsule (25 mg total) by mouth 3 (three) times daily as needed for anxiety. 90 capsule 1  . sildenafil (REVATIO) 20 MG tablet TAKE 2-5 PILLS ONCE DAILY AS NEEDED 30  tablet 1  . testosterone cypionate (DEPOTESTOSTERONE CYPIONATE) 200 MG/ML injection Inject 0.5 mLs (100 mg total) into the muscle every 14 (fourteen) days. 10 mL 0  . amphetamine-dextroamphetamine (ADDERALL XR) 20 MG 24 hr capsule Take 1 capsule (20 mg total) by mouth daily. 30 capsule 0  . [START ON 03/20/2017] amphetamine-dextroamphetamine (ADDERALL XR) 20 MG 24 hr capsule Take 1 capsule (20 mg total) by mouth daily. 30 capsule 0  . lamoTRIgine (LAMICTAL) 25 MG tablet Take 1 tablet (25 mg total) by mouth daily. Take 1 tablet daily for 1 week, then increase to 2 tablets 60 tablet 1  No current facility-administered medications for this visit.     Neurologic: Headache: Negative Seizure: Negative Paresthesias: Negative  Musculoskeletal: Strength & Muscle Tone: within normal limits Gait & Station: normal Patient leans: N/A  Psychiatric Specialty Exam: ROS  Blood pressure 120/76, pulse 95, height 6\' 3"  (1.905 m), weight 272 lb (123.4 kg), SpO2 96 %.Body mass index is 34 kg/m.  General Appearance: Casual and Fairly Groomed  Eye Contact:  Good  Speech:  Clear and Coherent and Normal Rate  Volume:  Normal  Mood:  Anxious and Depressed  Affect:  Congruent and Depressed  Thought Process:  Goal Directed  Orientation:  Full (Time, Place, and Person)  Thought Content: Logical   Suicidal Thoughts:  No  Homicidal Thoughts:  No  Memory:  Immediate;   Fair  Judgement:  Fair  Insight:  Shallow  Psychomotor Activity:  Normal  Concentration:  Concentration: Good  Recall:  Good  Fund of Knowledge: Good  Language: Good  Akathisia:  Negative  Handed:  Right  AIMS (if indicated):  0  Assets:  Communication Skills Desire for Improvement Neurosurgeon Vocational/Educational  ADL's:  Intact  Cognition: WNL  Sleep:  6-7 hours, suspected apnea    Treatment Plan Summary: Paul Parrish is a 46 year old male with a prior psychiatric history of anxiety  and depressive symptoms, with a history of untreated sleep apnea. I suspect his untreated sleep apnea is contributing significantly to his low energy and depressive symptoms. I suspect this is also contributed to treatment resistance with various SSRI therapies.  I recommended the patient that we reduce his polypharmacy, and express particular concern about using 2 highly controlled medications with contradicting mechanisms of action. I've recommended that he taper and discontinue Xanax.  He has done a nice job with tapering his Xanax, and we agreed to continue gradually tapering Xanax as we augment his antidepressant regimen with Lamictal. We are also using Vistaril to help make it easier for him to come off of Xanax.  He does not have any acute safety issues, and has not been engaging in any alcohol use. He and his family and going through a very strenuous time given his 11 year old stepdaughters stage IV cancer, and very poor prognosis.  They are getting set up with hospice and palliative care for family support. We will follow-up in 6-8 weeks.  1. Panic attacks   2. GAD (generalized anxiety disorder)   3. Moderate episode of recurrent major depressive disorder ([place])   4. Obstructive sleep apnea     Continue Prozac 60 mg daily Initiate Lamictal 25 mg daily, increase to 50 mg daily in 1 week; patient educated on risk of SJS Continue Vistaril 25-50 mg 3 times daily for anxiety Decrease Xanax to 0.5 mg twice daily; patient instructed to continue tapering gradually over the coming months Continue Adderall 20 mg XR daily Continue gabapentin 600 mg 3 times daily Polysomnography is scheduled for 2-3 weeks from now Follow-up in 6 weeks  Aundra Dubin, MD 02/20/2017, 2:09 PM

## 2017-02-21 ENCOUNTER — Other Ambulatory Visit (HOSPITAL_COMMUNITY): Payer: Self-pay | Admitting: Psychiatry

## 2017-02-21 ENCOUNTER — Encounter (HOSPITAL_COMMUNITY): Payer: Self-pay | Admitting: Psychiatry

## 2017-02-21 DIAGNOSIS — G4730 Sleep apnea, unspecified: Secondary | ICD-10-CM

## 2017-02-21 DIAGNOSIS — L409 Psoriasis, unspecified: Secondary | ICD-10-CM

## 2017-03-09 ENCOUNTER — Encounter (HOSPITAL_BASED_OUTPATIENT_CLINIC_OR_DEPARTMENT_OTHER): Payer: Self-pay

## 2017-03-17 DIAGNOSIS — L409 Psoriasis, unspecified: Secondary | ICD-10-CM | POA: Diagnosis not present

## 2017-03-19 ENCOUNTER — Encounter (HOSPITAL_COMMUNITY): Payer: Self-pay | Admitting: Psychiatry

## 2017-03-19 DIAGNOSIS — F410 Panic disorder [episodic paroxysmal anxiety] without agoraphobia: Secondary | ICD-10-CM

## 2017-03-19 DIAGNOSIS — F411 Generalized anxiety disorder: Secondary | ICD-10-CM

## 2017-03-24 MED ORDER — HYDROXYZINE PAMOATE 50 MG PO CAPS: 50.0000 mg | ORAL_CAPSULE | Freq: Three times a day (TID) | ORAL | 1 refills | Status: DC | PRN

## 2017-03-26 ENCOUNTER — Encounter: Payer: Self-pay | Admitting: Family Medicine

## 2017-04-15 ENCOUNTER — Ambulatory Visit (HOSPITAL_BASED_OUTPATIENT_CLINIC_OR_DEPARTMENT_OTHER): Payer: 59 | Attending: Psychiatry | Admitting: Internal Medicine

## 2017-04-15 VITALS — Ht 74.0 in | Wt 270.0 lb

## 2017-04-15 DIAGNOSIS — G4733 Obstructive sleep apnea (adult) (pediatric): Secondary | ICD-10-CM

## 2017-04-15 DIAGNOSIS — G4736 Sleep related hypoventilation in conditions classified elsewhere: Secondary | ICD-10-CM | POA: Diagnosis not present

## 2017-04-15 DIAGNOSIS — G4730 Sleep apnea, unspecified: Secondary | ICD-10-CM

## 2017-04-15 DIAGNOSIS — R0989 Other specified symptoms and signs involving the circulatory and respiratory systems: Secondary | ICD-10-CM | POA: Diagnosis not present

## 2017-04-17 ENCOUNTER — Encounter (HOSPITAL_COMMUNITY): Payer: Self-pay | Admitting: Psychiatry

## 2017-04-21 ENCOUNTER — Other Ambulatory Visit (HOSPITAL_COMMUNITY): Payer: Self-pay

## 2017-04-21 ENCOUNTER — Other Ambulatory Visit (HOSPITAL_COMMUNITY): Payer: Self-pay | Admitting: Psychiatry

## 2017-04-21 DIAGNOSIS — F411 Generalized anxiety disorder: Secondary | ICD-10-CM

## 2017-04-21 DIAGNOSIS — F331 Major depressive disorder, recurrent, moderate: Secondary | ICD-10-CM

## 2017-04-21 DIAGNOSIS — F410 Panic disorder [episodic paroxysmal anxiety] without agoraphobia: Secondary | ICD-10-CM

## 2017-04-21 MED ORDER — LAMOTRIGINE 100 MG PO TABS: 100.0000 mg | ORAL_TABLET | Freq: Every day | ORAL | 0 refills | Status: DC

## 2017-04-21 MED ORDER — LAMOTRIGINE 25 MG PO TABS
25.0000 mg | ORAL_TABLET | Freq: Every day | ORAL | 0 refills | Status: DC
Start: 2017-04-21 — End: 2017-04-21

## 2017-04-21 NOTE — Progress Notes (Signed)
Fax from patients pharmacy for a 90 day order on Lamotrigine. Insurance will not pay for month to month. I sent a new order for 90 days.

## 2017-04-22 ENCOUNTER — Other Ambulatory Visit (HOSPITAL_COMMUNITY): Payer: Self-pay | Admitting: Psychiatry

## 2017-04-22 DIAGNOSIS — F411 Generalized anxiety disorder: Secondary | ICD-10-CM

## 2017-04-22 DIAGNOSIS — F331 Major depressive disorder, recurrent, moderate: Secondary | ICD-10-CM

## 2017-04-22 DIAGNOSIS — F410 Panic disorder [episodic paroxysmal anxiety] without agoraphobia: Secondary | ICD-10-CM

## 2017-04-22 NOTE — Telephone Encounter (Signed)
Tapering to 0.5 mg once daily, as per treatment plan

## 2017-04-25 NOTE — Telephone Encounter (Signed)
If he wants to come pick this up today, we can refill. Otherwise, I am seeing him on Tuesday if he can wait until then

## 2017-04-28 ENCOUNTER — Encounter (HOSPITAL_COMMUNITY): Payer: Self-pay | Admitting: Psychiatry

## 2017-04-29 ENCOUNTER — Encounter (HOSPITAL_COMMUNITY): Payer: Self-pay | Admitting: Psychiatry

## 2017-04-29 ENCOUNTER — Ambulatory Visit (INDEPENDENT_AMBULATORY_CARE_PROVIDER_SITE_OTHER): Payer: 59 | Admitting: Psychiatry

## 2017-04-29 VITALS — BP 130/78 | HR 90 | Ht 74.0 in | Wt 276.6 lb

## 2017-04-29 DIAGNOSIS — F411 Generalized anxiety disorder: Secondary | ICD-10-CM

## 2017-04-29 DIAGNOSIS — R5382 Chronic fatigue, unspecified: Secondary | ICD-10-CM | POA: Diagnosis not present

## 2017-04-29 DIAGNOSIS — F331 Major depressive disorder, recurrent, moderate: Secondary | ICD-10-CM

## 2017-04-29 DIAGNOSIS — F410 Panic disorder [episodic paroxysmal anxiety] without agoraphobia: Secondary | ICD-10-CM

## 2017-04-29 MED ORDER — PROPRANOLOL HCL 10 MG PO TABS: 10.0000 mg | ORAL_TABLET | Freq: Three times a day (TID) | ORAL | 2 refills | Status: DC

## 2017-04-29 MED ORDER — AMPHETAMINE-DEXTROAMPHET ER 20 MG PO CP24: 20.0000 mg | ORAL_CAPSULE | Freq: Every day | ORAL | 0 refills | Status: DC

## 2017-04-29 NOTE — Progress Notes (Signed)
[facility] MD/PA/NP OP Progress Note  04/29/2017 2:21 PM Paul Parrish  MRN:  [id number]  Chief Complaint: med management  Subjective:  Paul Parrish presents for med management for depression and fatigue symptoms.  He reports that the Lamictal has started to help more now that we moved up to 100 mg daily. He reports that he still uses Xanax about once a day, and sometimes for sleep if he had taken a nap during the day.  He reports that he often feels jittery and nervous, and notices that he sweats a lot especially when he is anxious. He reports that the Vistaril does help calm his anxiety down, but he wonders about something else. We discussed propranolol, and how this can reduce anxiety, panic, and the physical symptoms of sweating and jitteriness. He reports that he does have a history of resting tremor in his family.  We agreed to start propranolol 10 mg 3 times a day, and discontinue Vistaril. We also agreed to continue Adderall XR at 20 mg daily, with the agreement that he is to use Xanax once a day only for the next month, and then we will decrease it further to only once a day as needed.    He denies any safety issues or suicidality. Reports that he is going to [place] in 2 weeks with his stepdaughter for her make a wish [employer] trip. He reports that she continues to contend with her cancer diagnosis, but there is a glimmer of hope that she may be able to make it through based on this recent round of chemotherapy. He reports that he has started going for walks with his wife to get more exercise, and he has cut out sugary sodas. I applauded this effort, and encouraged him to keep on moving with these behavioral modifications.  Visit Diagnosis:    ICD-10-CM   1. Chronic fatigue R53.82 amphetamine-dextroamphetamine (ADDERALL XR) 20 MG 24 hr capsule  2. Moderate episode of recurrent major depressive disorder (HCC) F33.1 amphetamine-dextroamphetamine (ADDERALL XR) 20 MG 24 hr capsule    propranolol  (INDERAL) 10 MG tablet  3. Panic attacks F41.0 amphetamine-dextroamphetamine (ADDERALL XR) 20 MG 24 hr capsule    propranolol (INDERAL) 10 MG tablet  4. GAD (generalized anxiety disorder) F41.1 amphetamine-dextroamphetamine (ADDERALL XR) 20 MG 24 hr capsule    propranolol (INDERAL) 10 MG tablet    Past Psychiatric History: See intake H&P for full details. Reviewed, with no updates at this time.   Past Medical History:  Past Medical History:  Diagnosis Date  . Anxiety   . Depression   . Elevated hemoglobin ([place])   . Low testosterone   . Small bowel obstruction ([place]) 10/21/2016    Past Surgical History:  Procedure Laterality Date  . NASAL SINUS SURGERY  02/2016    Family Psychiatric History: See intake H&P for full details. Reviewed, with no updates at this time.   Family History:  Family History  Problem Relation Age of Onset  . Cancer Mother        kidney  . Hypertension Father   . Diabetes Father   . Diabetes Brother   . Cancer Paternal Grandmother     Social History:  Social History   Social History  . Marital status: Married    Spouse name: N/A  . Number of children: N/A  . Years of education: N/A   Social History Main Topics  . Smoking status: Never Smoker  . Smokeless tobacco: Never Used  . Alcohol use Yes  Comment: 10/22/2016 "nothing in over 1 year"  . Drug use: No  . Sexual activity: Yes    Partners: Female    Birth control/ protection: None   Other Topics Concern  . None   Social History Narrative  . None    Allergies:  Allergies  Allergen Reactions  . Amoxicillin Other (See Comments)    High fever and possible heart racing  . Bactrim [Sulfamethoxazole-Trimethoprim] Other (See Comments)    Fever, body aches, chills  . Penicillins Other (See Comments)    High fever Has patient had a PCN reaction causing immediate rash, facial/tongue/throat swelling, SOB or lightheadedness with hypotension: No Has patient had a PCN reaction causing  severe rash involving mucus membranes or skin necrosis: No Has patient had a PCN reaction that required hospitalization No Has patient had a PCN reaction occurring within the last 10 years: No If all of the above answers are "NO", then may proceed with Cephalosporin use.     Metabolic Disorder Labs: Lab Results  Component Value Date   HGBA1C 4.9% 08/13/2013   Lab Results  Component Value Date   PROLACTIN 14.5 09/08/2015   PROLACTIN 16.4 (H) 06/14/2015   Lab Results  Component Value Date   CHOL 221 (H) 06/15/2016   TRIG 390 (H) 06/15/2016   HDL 25 (L) 06/15/2016   CHOLHDL 8.8 (H) 06/15/2016   LDLCALC 118 (H) 06/15/2016   LDLCALC 111 (H) 05/18/2014     Current Medications: Current Outpatient Prescriptions  Medication Sig Dispense Refill  . ALPRAZolam (XANAX) 0.5 MG tablet Take 1 tablet (0.5 mg total) by mouth daily as needed for anxiety. 30 tablet 1  . amphetamine-dextroamphetamine (ADDERALL XR) 20 MG 24 hr capsule Take 1 capsule (20 mg total) by mouth daily. 30 capsule 0  . [START ON 05/29/2017] amphetamine-dextroamphetamine (ADDERALL XR) 20 MG 24 hr capsule Take 1 capsule (20 mg total) by mouth daily. 30 capsule 0  . [START ON 06/28/2017] amphetamine-dextroamphetamine (ADDERALL XR) 20 MG 24 hr capsule Take 1 capsule (20 mg total) by mouth daily. 30 capsule 0  . FLUoxetine (PROZAC) 20 MG capsule Take 3 capsules (60 mg total) by mouth daily. 90 capsule 2  . gabapentin (NEURONTIN) 300 MG capsule Take 2 capsules (600 mg total) by mouth 3 (three) times daily. 180 capsule 11  . lamoTRIgine (LAMICTAL) 100 MG tablet Take 1 tablet (100 mg total) by mouth daily. 90 tablet 0  . propranolol (INDERAL) 10 MG tablet Take 1 tablet (10 mg total) by mouth 3 (three) times daily. 90 tablet 2  . sildenafil (REVATIO) 20 MG tablet TAKE 2-5 PILLS ONCE DAILY AS NEEDED 30 tablet 1  . testosterone cypionate (DEPOTESTOSTERONE CYPIONATE) 200 MG/ML injection Inject 0.5 mLs (100 mg total) into the muscle every  14 (fourteen) days. 10 mL 0   No current facility-administered medications for this visit.     Neurologic: Headache: Negative Seizure: Negative Paresthesias: Negative  Musculoskeletal: Strength & Muscle Tone: within normal limits Gait & Station: normal Patient leans: N/A  Psychiatric Specialty Exam: ROS  Blood pressure 130/78, pulse 90, height 6\' 2"  (1.88 m), weight 276 lb 9.6 oz (125.5 kg).Body mass index is 35.51 kg/m.  General Appearance: Casual and Well Groomed  Eye Contact:  Good  Speech:  Clear and Coherent and Normal Rate  Volume:  Normal  Mood:  Anxious  Affect:  Congruent and Depressed  Thought Process:  Goal Directed  Orientation:  Full (Time, Place, and Person)  Thought Content: Logical   Suicidal  Thoughts:  No  Homicidal Thoughts:  No  Memory:  Immediate;   Fair  Judgement:  Fair  Insight:  Fair, Present and improving  Psychomotor Activity:  Normal  Concentration:  Concentration: Good  Recall:  Good  Fund of Knowledge: Good  Language: Good  Akathisia:  Negative  Handed:  Right  AIMS (if indicated):  0  Assets:  Communication Skills Desire for Improvement Financial Resources/Insurance Housing Leisure Time Social Support Transportation Vocational/Educational  ADL's:  Intact  Cognition: WNL  Sleep:  6-7 hours, snoring    Treatment Plan Summary: Paul Parrish is a 46 year old male with a psychiatric history of anxiety and depressive symptoms, with a history of untreated sleep apnea.  He has had significant family stressors lately with his 46 year old 65 contending with stage IV cancer and a poor prognosis.  He recently completed his sleep study and we are waiting on the results. We are working on tapering Xanax to discontinue, and have titrated Lamictal to help with some of his anxiety. Given his physiologic symptoms of anxiety and panic, including tremulousness and sweating, we agreed to start propranolol. He does not present any acute safety  issues and agrees to follow-up in 2-3 months or sooner if needed.   1. Chronic fatigue   2. Moderate episode of recurrent major depressive disorder ([place])   3. Panic attacks   4. GAD (generalized anxiety disorder)     Continue Prozac 60 mg daily Continue Lamictal 100 mg daily Decrease Xanax to 0.5 mg once daily; in 4 weeks, reduced to once daily prn only Vistaril available as needed, but should not be scheduled Initiate propranolol 10 mg 3 times daily; okay to increase to 20 mg 3 times daily as tolerated Continue Adderall 20 mg XR daily Continue gabapentin 600 mg 3 times daily Follow-up on polysomnography results  Aundra Dubin, MD 04/29/2017, 2:21 PM

## 2017-05-02 ENCOUNTER — Encounter (HOSPITAL_COMMUNITY): Payer: Self-pay | Admitting: Psychiatry

## 2017-05-03 DIAGNOSIS — G4730 Sleep apnea, unspecified: Secondary | ICD-10-CM | POA: Diagnosis not present

## 2017-05-03 NOTE — Procedures (Signed)
    Patient Name: Paul Parrish, Paul Parrish Date: 04/15/2017 Gender: Male D.O.B: Jul 07, 1971 Age (years): 45 Referring Provider: Lulu Riding Height (inches): 49 Interpreting Physician: Baird Lyons MD, ABSM Weight (lbs): 270 RPSGT: Jacolyn Reedy BMI: 35 MRN: [id number] Neck Size: 19.00 CLINICAL INFORMATION Sleep Study Type: unattended HST  Indication for sleep study: Insomnia with sleep apnea  Epworth Sleepiness Score: 14  SLEEP STUDY TECHNIQUE A multi-channel overnight portable sleep study was performed. The channels recorded were: nasal airflow, thoracic respiratory movement, and oxygen saturation with a pulse oximetry. Snoring was also monitored.  MEDICATIONS Patient self administered medications include: none reported.  SLEEP ARCHITECTURE Patient was studied for 379.4 minutes. The sleep efficiency was 98.7 % and the patient was supine for 69.1%. The arousal index was 0.0 per hour.  RESPIRATORY PARAMETERS The overall AHI was 57.4 per hour, with a central apnea index of 0.2 per hour.  The oxygen nadir was 67% during sleep.  CARDIAC DATA Mean heart rate during sleep was 60.5 bpm.  IMPRESSIONS - Severe obstructive sleep apnea occurred during this study (AHI = 57.4/h). - No significant central sleep apnea occurred during this study (CAI = 0.2/h). - Severe oxygen desaturation was noted during this study (Min O2 = 67%, Mean 91%). - Patient snored.  DIAGNOSIS - Obstructive Sleep Apnea (327.23 [G47.33 ICD-10]) - Nocturnal Hypoxemia (327.26 [G47.36 ICD-10])  RECOMMENDATIONS - Recommend CPAP titration. Other choices would be based on clinical judgment. - Be careful with alcohol, sedatives and other CNS depressants that may worsen sleep apnea and disrupt normal sleep architecture. - Sleep hygiene should be reviewed to assess factors that may improve sleep quality. - Weight management and regular exercise should be initiated or continued.  [Electronically signed]  05/03/2017 10:54 AM  Baird Lyons MD, ABSM Diplomate, American Board of Sleep Medicine   NPI: [id number]  [place], American Board of Sleep Medicine  ELECTRONICALLY SIGNED ON:  05/03/2017, 10:51 AM [place] PH: [phone]   FX: [phone] [place]

## 2017-05-05 ENCOUNTER — Other Ambulatory Visit (HOSPITAL_COMMUNITY): Payer: Self-pay | Admitting: Psychiatry

## 2017-05-05 ENCOUNTER — Encounter (HOSPITAL_COMMUNITY): Payer: Self-pay | Admitting: Psychiatry

## 2017-05-05 DIAGNOSIS — G4733 Obstructive sleep apnea (adult) (pediatric): Secondary | ICD-10-CM

## 2017-05-05 DIAGNOSIS — L409 Psoriasis, unspecified: Secondary | ICD-10-CM | POA: Diagnosis not present

## 2017-05-14 ENCOUNTER — Ambulatory Visit: Payer: 59 | Admitting: "Endocrinology

## 2017-05-17 ENCOUNTER — Other Ambulatory Visit (HOSPITAL_COMMUNITY): Payer: Self-pay | Admitting: Psychiatry

## 2017-05-17 DIAGNOSIS — F411 Generalized anxiety disorder: Secondary | ICD-10-CM

## 2017-05-17 DIAGNOSIS — F410 Panic disorder [episodic paroxysmal anxiety] without agoraphobia: Secondary | ICD-10-CM

## 2017-05-21 ENCOUNTER — Encounter (HOSPITAL_COMMUNITY): Payer: Self-pay | Admitting: Psychiatry

## 2017-05-27 ENCOUNTER — Other Ambulatory Visit (HOSPITAL_COMMUNITY): Payer: Self-pay | Admitting: Psychiatry

## 2017-05-27 ENCOUNTER — Encounter (HOSPITAL_COMMUNITY): Payer: Self-pay | Admitting: Psychiatry

## 2017-05-27 DIAGNOSIS — F410 Panic disorder [episodic paroxysmal anxiety] without agoraphobia: Secondary | ICD-10-CM

## 2017-05-29 ENCOUNTER — Ambulatory Visit: Payer: 59 | Admitting: "Endocrinology

## 2017-05-29 ENCOUNTER — Other Ambulatory Visit (HOSPITAL_COMMUNITY): Payer: Self-pay | Admitting: Psychiatry

## 2017-06-03 ENCOUNTER — Encounter (HOSPITAL_COMMUNITY): Payer: Self-pay | Admitting: Psychiatry

## 2017-06-03 ENCOUNTER — Other Ambulatory Visit (HOSPITAL_COMMUNITY): Payer: Self-pay | Admitting: Psychiatry

## 2017-06-03 DIAGNOSIS — F410 Panic disorder [episodic paroxysmal anxiety] without agoraphobia: Secondary | ICD-10-CM

## 2017-06-03 DIAGNOSIS — F411 Generalized anxiety disorder: Secondary | ICD-10-CM

## 2017-06-03 MED ORDER — HYDROXYZINE PAMOATE 50 MG PO CAPS: 100.0000 mg | ORAL_CAPSULE | Freq: Three times a day (TID) | ORAL | 1 refills | Status: DC | PRN

## 2017-06-09 ENCOUNTER — Ambulatory Visit (INDEPENDENT_AMBULATORY_CARE_PROVIDER_SITE_OTHER): Payer: 59 | Admitting: Psychiatry

## 2017-06-09 ENCOUNTER — Encounter (HOSPITAL_COMMUNITY): Payer: Self-pay | Admitting: Psychiatry

## 2017-06-09 VITALS — BP 128/76 | HR 72 | Ht 74.0 in | Wt 273.2 lb

## 2017-06-09 DIAGNOSIS — F331 Major depressive disorder, recurrent, moderate: Secondary | ICD-10-CM

## 2017-06-09 DIAGNOSIS — F411 Generalized anxiety disorder: Secondary | ICD-10-CM | POA: Diagnosis not present

## 2017-06-09 DIAGNOSIS — F4010 Social phobia, unspecified: Secondary | ICD-10-CM | POA: Diagnosis not present

## 2017-06-09 DIAGNOSIS — F410 Panic disorder [episodic paroxysmal anxiety] without agoraphobia: Secondary | ICD-10-CM | POA: Diagnosis not present

## 2017-06-09 MED ORDER — VENLAFAXINE HCL ER 75 MG PO CP24: 150.0000 mg | ORAL_CAPSULE | Freq: Every day | ORAL | 1 refills | Status: DC

## 2017-06-09 MED ORDER — HYDROXYZINE PAMOATE 50 MG PO CAPS: 100.0000 mg | ORAL_CAPSULE | Freq: Three times a day (TID) | ORAL | 1 refills | Status: DC | PRN

## 2017-06-09 MED ORDER — AMPHETAMINE-DEXTROAMPHET ER 20 MG PO CP24: 20.0000 mg | ORAL_CAPSULE | Freq: Every day | ORAL | 0 refills | Status: DC

## 2017-06-09 MED ORDER — PROPRANOLOL HCL 20 MG PO TABS: 20.0000 mg | ORAL_TABLET | Freq: Three times a day (TID) | ORAL | 2 refills | Status: DC

## 2017-06-09 NOTE — Progress Notes (Signed)
[facility] MD/PA/NP OP Progress Note  06/09/2017 9:09 AM Paul Parrish  MRN:  [id number]  Chief Complaint: med check  HPI: Paul Parrish reports that he is now fully discontinue Xanax. He decided to restart propranolol 10 mg 3 times a day in addition to the Vistaril 100 mg 3 times a day. He reports that this time he is tolerating it better. He reports that he feels less jittery and nervous. He continues to have significant social anxiety, worry, rumination, and depression symptoms. We discussed a switch from Prozac to Effexor, and reviewed the risks and benefits associated with this. He reports that he is getting his CPAP titration this week on Thursday. I spent time reiterating and reeducating him on how sleep apnea can affect attention, memory, mood, motivation. He is eager to have this completed and use his CPAP.  We agreed to continue Lamictal and gabapentin at the current doses, and agreed to continue Adderall for the time being, until we can see how his CPAP titration has affected his energy and memory. He reports that he had a good trip to [place] with his family, and his stepdaughter is doing well in terms of her health. He reports that he was on a plane flight for the first time, and it went fine, and he actually liked flying.  He agrees to follow-up in 3 months or sooner if needed   Visit Diagnosis:    ICD-10-CM   1. Social anxiety disorder F40.10 venlafaxine XR (EFFEXOR XR) 75 MG 24 hr capsule  2. Panic attacks F41.0 hydrOXYzine (VISTARIL) 50 MG capsule    propranolol (INDERAL) 20 MG tablet    amphetamine-dextroamphetamine (ADDERALL XR) 20 MG 24 hr capsule  3. GAD (generalized anxiety disorder) F41.1 hydrOXYzine (VISTARIL) 50 MG capsule    propranolol (INDERAL) 20 MG tablet    amphetamine-dextroamphetamine (ADDERALL XR) 20 MG 24 hr capsule  4. Moderate episode of recurrent major depressive disorder (HCC) F33.1 amphetamine-dextroamphetamine (ADDERALL XR) 20 MG 24 hr capsule    Past  Psychiatric History: See intake H&P for full details. Reviewed, with no updates at this time.   Past Medical History:  Past Medical History:  Diagnosis Date  . Anxiety   . Depression   . Elevated hemoglobin ([place])   . Low testosterone   . Small bowel obstruction ([place]) 10/21/2016    Past Surgical History:  Procedure Laterality Date  . NASAL SINUS SURGERY  02/2016    Family Psychiatric History: See intake H&P for full details. Reviewed, with no updates at this time.   Family History:  Family History  Problem Relation Age of Onset  . Cancer Mother        kidney  . Hypertension Father   . Diabetes Father   . Diabetes Brother   . Cancer Paternal Grandmother     Social History:  Social History   Social History  . Marital status: Married    Spouse name: N/A  . Number of children: N/A  . Years of education: N/A   Social History Main Topics  . Smoking status: Never Smoker  . Smokeless tobacco: Never Used  . Alcohol use Yes     Comment: 10/22/2016 "nothing in over 1 year"  . Drug use: No  . Sexual activity: Yes    Partners: Female    Birth control/ protection: None   Other Topics Concern  . None   Social History Narrative  . None    Allergies:  Allergies  Allergen Reactions  . Amoxicillin Other (  See Comments)    High fever and possible heart racing  . Bactrim [Sulfamethoxazole-Trimethoprim] Other (See Comments)    Fever, body aches, chills  . Penicillins Other (See Comments)    High fever Has patient had a PCN reaction causing immediate rash, facial/tongue/throat swelling, SOB or lightheadedness with hypotension: No Has patient had a PCN reaction causing severe rash involving mucus membranes or skin necrosis: No Has patient had a PCN reaction that required hospitalization No Has patient had a PCN reaction occurring within the last 10 years: No If all of the above answers are "NO", then may proceed with Cephalosporin use.     Metabolic Disorder Labs: Lab  Results  Component Value Date   HGBA1C 4.9% 08/13/2013   Lab Results  Component Value Date   PROLACTIN 14.5 09/08/2015   PROLACTIN 16.4 (H) 06/14/2015   Lab Results  Component Value Date   CHOL 221 (H) 06/15/2016   TRIG 390 (H) 06/15/2016   HDL 25 (L) 06/15/2016   CHOLHDL 8.8 (H) 06/15/2016   LDLCALC 118 (H) 06/15/2016   LDLCALC 111 (H) 05/18/2014   Lab Results  Component Value Date   TSH 2.680 11/08/2016   TSH 2.520 05/02/2015    Therapeutic Level Labs: No results found for: LITHIUM No results found for: VALPROATE No components found for:  CBMZ  Current Medications: Current Outpatient Prescriptions  Medication Sig Dispense Refill  . [START ON 06/28/2017] amphetamine-dextroamphetamine (ADDERALL XR) 20 MG 24 hr capsule Take 1 capsule (20 mg total) by mouth daily. 30 capsule 0  . [START ON 07/09/2017] amphetamine-dextroamphetamine (ADDERALL XR) 20 MG 24 hr capsule Take 1 capsule (20 mg total) by mouth daily. 30 capsule 0  . [START ON 08/08/2017] amphetamine-dextroamphetamine (ADDERALL XR) 20 MG 24 hr capsule Take 1 capsule (20 mg total) by mouth daily. 30 capsule 0  . gabapentin (NEURONTIN) 300 MG capsule Take 2 capsules (600 mg total) by mouth 3 (three) times daily. 180 capsule 11  . hydrOXYzine (VISTARIL) 50 MG capsule Take 2 capsules (100 mg total) by mouth 3 (three) times daily as needed for anxiety. 270 capsule 1  . lamoTRIgine (LAMICTAL) 100 MG tablet Take 1 tablet (100 mg total) by mouth daily. 90 tablet 0  . sildenafil (REVATIO) 20 MG tablet TAKE 2-5 PILLS ONCE DAILY AS NEEDED 30 tablet 1  . testosterone cypionate (DEPOTESTOSTERONE CYPIONATE) 200 MG/ML injection Inject 0.5 mLs (100 mg total) into the muscle every 14 (fourteen) days. 10 mL 0  . propranolol (INDERAL) 20 MG tablet Take 1 tablet (20 mg total) by mouth 3 (three) times daily. 90 tablet 2  . venlafaxine XR (EFFEXOR XR) 75 MG 24 hr capsule Take 2 capsules (150 mg total) by mouth daily. Take 1 capsule daily for 2  weeks, then increase to 2 capsules daily 90 capsule 1   No current facility-administered medications for this visit.      Musculoskeletal: Strength & Muscle Tone: within normal limits Gait & Station: normal Patient leans: N/A  Psychiatric Specialty Exam: ROS  Blood pressure 128/76, pulse 72, height 6\' 2"  (1.88 m), weight 273 lb 3.2 oz (123.9 kg).Body mass index is 35.08 kg/m.  General Appearance: Casual and Fairly Groomed  Eye Contact:  Good  Speech:  Clear and Coherent  Volume:  Normal  Mood:  Anxious and Dysphoric  Affect:  Appropriate and Congruent  Thought Process:  Goal Directed  Orientation:  Full (Time, Place, and Person)  Thought Content: Logical   Suicidal Thoughts:  No  Homicidal Thoughts:  No  Memory:  Immediate;   Good  Judgement:  Fair  Insight:  Shallow  Psychomotor Activity:  Normal  Concentration:  Concentration: Fair  Recall:  [place] of Knowledge: Fair  Language: Good  Akathisia:  Negative  Handed:  Right  AIMS (if indicated): not done  Assets:  Communication Skills Desire for Improvement Financial Resources/Insurance Housing Intimacy Leisure Time Physical Health Social Support Talents/Skills Transportation Vocational/Educational  ADL's:  Intact  Cognition: WNL  Sleep:  Poor   Screenings: GAD-7     Office Visit from 11/08/2016 in [facility] Office Visit from 11/30/2015 in [facility] Office Visit from 09/28/2015 in [facility]  Total GAD-7 Score  16  13  11     PHQ2-9     Office Visit from 11/08/2016 in [place] Visit from 11/30/2015 in [place] Visit from 10/06/2015 in [facility] Office Visit from 09/28/2015 in [place] Visit from 07/26/2014 in [facility]  PHQ-2 Total Score  4  0  0  1  1  PHQ-9 Total Score  9  -  -  -  -        Assessment and Plan: Paul Parrish is a 46 year old male with psoriasis, severe sleep apnea, CPAP titration is scheduled for this week, with comorbid depression, panic attacks, social anxiety, and chronic fatigue. We worked hard to taper him off of Xanax, and are managing anxiety as below. I suspect his mood and anxiety symptoms, in addition to attention symptoms, will dramatically improve when his severe sleep apnea is addressed with CPAP titration. Will proceed as below and follow up in 3 months.  He has a possibility of coping strategies and frustration tolerance, and I recommended he engage in CBT for individual therapy.  1. Social anxiety disorder   2. Panic attacks   3. GAD (generalized anxiety disorder)   4. Moderate episode of recurrent major depressive disorder (HCC)    Discontinue Prozac 60 mg, given a plateau in benefit Initiate Effexor 75 mg XR 2 week, then increase to 150 mg Continue gabapentin 600 mg 3 times daily Continue Lamictal 100 mg daily Continue Vistaril 100 mg 3 times daily Propranolol increased to 20 mg 3 times daily Continue Adderall for now, I anticipate we can discontinue this at our follow-up visit so long as he is continuously and consistently wearing his CPAP machine and his daytime fatigue is improved Xanax now fully discontinued Recommend CBT for anxiety and mood symptoms Follow-up in 3 months  Aundra Dubin, MD 06/09/2017, 9:09 AM

## 2017-06-09 NOTE — Patient Instructions (Signed)
STOP prozac  STOP Xanax (already done)  On Wednesday, start Effexor 75 mg XR ( 1 capsule) in the MORNING AFTER 2 weeks, increase to 150 mg XR daily  Increase Propranolol to 20 mg tablet 3 times per day Continue Vistaril 100 mg (2 capsules) 3 times per day  CPAP fitting this week  Continue adderall as is for now  Continue Lamictal as is for now  Continue gabapentin as is for now

## 2017-06-12 ENCOUNTER — Ambulatory Visit (HOSPITAL_BASED_OUTPATIENT_CLINIC_OR_DEPARTMENT_OTHER): Payer: 59 | Attending: Psychiatry | Admitting: Internal Medicine

## 2017-06-12 VITALS — Ht 74.0 in | Wt 275.0 lb

## 2017-06-12 DIAGNOSIS — G4733 Obstructive sleep apnea (adult) (pediatric): Secondary | ICD-10-CM | POA: Diagnosis not present

## 2017-06-18 ENCOUNTER — Encounter (HOSPITAL_COMMUNITY): Payer: Self-pay | Admitting: Psychiatry

## 2017-06-18 DIAGNOSIS — F411 Generalized anxiety disorder: Secondary | ICD-10-CM

## 2017-06-18 DIAGNOSIS — F410 Panic disorder [episodic paroxysmal anxiety] without agoraphobia: Secondary | ICD-10-CM

## 2017-06-21 NOTE — Procedures (Signed)
  Patient Name: Paul Parrish, Paul Parrish Date: 06/12/2017 Gender: Male D.O.B: 12-12-1970 Age (years): 46 Referring Provider: Lulu Riding Height (inches): 74 Interpreting Physician: Baird Lyons MD, ABSM Weight (lbs): 275 RPSGT: Laren Everts BMI: 35 MRN: [id number] Neck Size: 18.00 CLINICAL INFORMATION The patient is referred for a CPAP titration to treat sleep apnea.  Date of NPSG, Split Night or HST:  Diagnostic NPSG 04/15/17  AHI 57.4/ hr, desaturation to 67%, body weight 270 lbs  SLEEP STUDY TECHNIQUE As per the AASM Manual for the Scoring of Sleep and Associated Events v2.3 (April 2016) with a hypopnea requiring 4% desaturations.  The channels recorded and monitored were frontal, central and occipital EEG, electrooculogram (EOG), submentalis EMG (chin), nasal and oral airflow, thoracic and abdominal wall motion, anterior tibialis EMG, snore microphone, electrocardiogram, and pulse oximetry. Continuous positive airway pressure (CPAP) was initiated at the beginning of the study and titrated to treat sleep-disordered breathing.  MEDICATIONS Medications self-administered by patient taken the night of the study : GABAPENTIN, HYDROXYZINE, PROPRANOLOL  TECHNICIAN COMMENTS Comments added by technician: Patient had difficulty initiating sleep. Patient was restless all through the night.  Comments added by scorer: N/A RESPIRATORY PARAMETERS Optimal PAP Pressure (cm): 11 AHI at Optimal Pressure (/hr): 0.0 Overall Minimal O2 (%): 87.00 Supine % at Optimal Pressure (%): 50 Minimal O2 at Optimal Pressure (%): 91.0    SLEEP ARCHITECTURE The study was initiated at 9:52:42 PM and ended at 4:34:19 AM.  Sleep onset time was 119.1 minutes and the sleep efficiency was 49.1%. The total sleep time was 197.0 minutes.  The patient spent 16.50% of the night in stage N1 sleep, 52.79% in stage N2 sleep, 0.00% in stage N3 and 30.71% in REM.Stage REM latency was 169.0 minutes  Wake after sleep  onset was 85.6. Alpha intrusion was absent. Supine sleep was 60.41%.  CARDIAC DATA The 2 lead EKG demonstrated sinus rhythm. The mean heart rate was 59.51 beats per minute. Other EKG findings include: None.  LEG MOVEMENT DATA The total Periodic Limb Movements of Sleep (PLMS) were 0. The PLMS index was 0.00. A PLMS index of <15 is considered normal in adults.  IMPRESSIONS - The optimal PAP pressure was 11 cm of water. - Central sleep apnea was not noted during this titration (CAI = 0.3/h). - Mild oxygen desaturations were observed during this titration (min O2 = 87.00%). - The patient snored with Moderate snoring volume during this titration study. - No cardiac abnormalities were observed during this study. - Clinically significant periodic limb movements were not noted during this study. Arousals associated with PLMs were rare.  DIAGNOSIS - Obstructive Sleep Apnea (327.23 [G47.33 ICD-10])  RECOMMENDATIONS - Trial of CPAP therapy on 11 cm H2O with a Medium size Fisher&Paykel Full Face Mask Simplus mask and heated humidification. - Be careful with alcohol, sedatives and other CNS depressants that may worsen sleep apnea and disrupt normal sleep architecture. - Sleep hygiene should be reviewed to assess factors that may improve sleep quality. - Weight management and regular exercise should be initiated or continued.  [Electronically signed] 06/21/2017 09:45 AM  Baird Lyons MD, ABSM Diplomate, American Board of Sleep Medicine   NPI: [id number]  [place], American Board of Sleep Medicine  ELECTRONICALLY SIGNED ON:  06/21/2017, 9:40 AM [place] PH: [phone]   FX: [phone] [place]

## 2017-06-25 ENCOUNTER — Ambulatory Visit: Payer: 59 | Admitting: "Endocrinology

## 2017-06-26 ENCOUNTER — Other Ambulatory Visit: Payer: Self-pay | Admitting: Physician Assistant

## 2017-06-26 ENCOUNTER — Ambulatory Visit (HOSPITAL_COMMUNITY): Payer: Self-pay | Admitting: Psychiatry

## 2017-06-30 ENCOUNTER — Encounter (HOSPITAL_COMMUNITY): Payer: Self-pay | Admitting: Psychiatry

## 2017-07-01 ENCOUNTER — Other Ambulatory Visit (HOSPITAL_COMMUNITY): Payer: Self-pay | Admitting: Psychiatry

## 2017-07-01 DIAGNOSIS — F331 Major depressive disorder, recurrent, moderate: Secondary | ICD-10-CM

## 2017-07-01 DIAGNOSIS — F411 Generalized anxiety disorder: Secondary | ICD-10-CM

## 2017-07-02 ENCOUNTER — Other Ambulatory Visit (HOSPITAL_COMMUNITY): Payer: Self-pay

## 2017-07-02 ENCOUNTER — Encounter (HOSPITAL_COMMUNITY): Payer: Self-pay | Admitting: Psychiatry

## 2017-07-02 DIAGNOSIS — F410 Panic disorder [episodic paroxysmal anxiety] without agoraphobia: Secondary | ICD-10-CM

## 2017-07-02 DIAGNOSIS — F411 Generalized anxiety disorder: Secondary | ICD-10-CM

## 2017-07-02 MED ORDER — PROPRANOLOL HCL 20 MG PO TABS: 20.0000 mg | ORAL_TABLET | Freq: Three times a day (TID) | ORAL | 0 refills | Status: DC

## 2017-07-07 ENCOUNTER — Other Ambulatory Visit: Payer: Self-pay | Admitting: Physician Assistant

## 2017-07-09 DIAGNOSIS — G4733 Obstructive sleep apnea (adult) (pediatric): Secondary | ICD-10-CM | POA: Diagnosis not present

## 2017-07-11 ENCOUNTER — Encounter: Payer: Self-pay | Admitting: Family Medicine

## 2017-07-11 MED ORDER — SILDENAFIL CITRATE 20 MG PO TABS: ORAL_TABLET | ORAL | 5 refills | Status: DC

## 2017-07-15 ENCOUNTER — Other Ambulatory Visit: Payer: Self-pay | Admitting: "Endocrinology

## 2017-07-16 ENCOUNTER — Other Ambulatory Visit (HOSPITAL_COMMUNITY): Payer: Self-pay | Admitting: Psychiatry

## 2017-07-16 ENCOUNTER — Other Ambulatory Visit: Payer: Self-pay

## 2017-07-16 ENCOUNTER — Encounter (HOSPITAL_COMMUNITY): Payer: Self-pay | Admitting: Psychiatry

## 2017-07-16 MED ORDER — SILDENAFIL CITRATE 20 MG PO TABS: ORAL_TABLET | ORAL | 5 refills | Status: DC

## 2017-07-16 MED ORDER — TRAZODONE HCL 100 MG PO TABS: 100.0000 mg | ORAL_TABLET | Freq: Every day | ORAL | 1 refills | Status: DC

## 2017-07-29 ENCOUNTER — Telehealth: Payer: Self-pay

## 2017-07-29 NOTE — Telephone Encounter (Signed)
20 mg sildinafil is typically not covered by insurance for ED. It is cash pay and costs approx 1 dollar a pill at [facility].   Laroy Apple, MD [facility] 07/29/2017, 1:22 PM

## 2017-07-29 NOTE — Telephone Encounter (Signed)
Insurance denied prior auth for Sildenafil  Does not cover for erectile dysfunction

## 2017-08-07 ENCOUNTER — Encounter: Payer: Self-pay | Admitting: "Endocrinology

## 2017-08-08 ENCOUNTER — Other Ambulatory Visit: Payer: Self-pay | Admitting: "Endocrinology

## 2017-08-08 DIAGNOSIS — E291 Testicular hypofunction: Secondary | ICD-10-CM

## 2017-08-08 DIAGNOSIS — N529 Male erectile dysfunction, unspecified: Secondary | ICD-10-CM

## 2017-08-09 DIAGNOSIS — G4733 Obstructive sleep apnea (adult) (pediatric): Secondary | ICD-10-CM | POA: Diagnosis not present

## 2017-08-18 ENCOUNTER — Ambulatory Visit (INDEPENDENT_AMBULATORY_CARE_PROVIDER_SITE_OTHER): Payer: 59 | Admitting: Psychiatry

## 2017-08-18 ENCOUNTER — Encounter (HOSPITAL_COMMUNITY): Payer: Self-pay | Admitting: Psychiatry

## 2017-08-18 VITALS — BP 136/78 | HR 79 | Ht 75.0 in | Wt 281.8 lb

## 2017-08-18 DIAGNOSIS — Z9989 Dependence on other enabling machines and devices: Secondary | ICD-10-CM

## 2017-08-18 DIAGNOSIS — F4010 Social phobia, unspecified: Secondary | ICD-10-CM

## 2017-08-18 DIAGNOSIS — F341 Dysthymic disorder: Secondary | ICD-10-CM

## 2017-08-18 DIAGNOSIS — F411 Generalized anxiety disorder: Secondary | ICD-10-CM

## 2017-08-18 DIAGNOSIS — Z79899 Other long term (current) drug therapy: Secondary | ICD-10-CM | POA: Diagnosis not present

## 2017-08-18 DIAGNOSIS — F331 Major depressive disorder, recurrent, moderate: Secondary | ICD-10-CM

## 2017-08-18 MED ORDER — PROPRANOLOL HCL ER 60 MG PO CP24: 60.0000 mg | ORAL_CAPSULE | Freq: Every day | ORAL | 3 refills | Status: DC

## 2017-08-18 MED ORDER — CLOMIPRAMINE HCL 25 MG PO CAPS
25.0000 mg | ORAL_CAPSULE | Freq: Three times a day (TID) | ORAL | 2 refills | Status: DC
Start: 2017-08-18 — End: 2017-09-09

## 2017-08-18 MED ORDER — TRAZODONE HCL 100 MG PO TABS: 100.0000 mg | ORAL_TABLET | Freq: Every day | ORAL | 1 refills | Status: DC

## 2017-08-18 NOTE — Progress Notes (Signed)
BH MD/PA/NP OP Progress Note  08/18/2017 2:44 PM Paul Parrish  MRN:  [id number]  Chief Complaint: med management, anxious, depressed, panic  HPI: patient continues to struggle with severe anxiety, depression, dysphoria, panic attacks.  His energy is better with CPAP on board.  He reports that he takes 100 mg of Vistaril 3 times a day, 600 mg gabapentin 3 times a day, propranolol 20 mg 3 times a day, in addition to Lamictal, Effexor, trazodone, and Adderall.  I spent time with the patient reviewing which if any of these medications have been helpful.  We both agree that he has a substantial amount of polypharmacy, and I spent time with the patient expressing my recommendation that we dramatically overhaul his medication regimen.  Given that he has not had any substantial improvement or stability over the past 6 months, I recommended to the patient that we discontinue multiple medications.  Specifically, we agreed to discontinue Adderall given that he now has treatment for sleep apnea and is using his CPAP every night.  He reports that his excessive daytime sleepiness is much better now that he has CPAP.  We agreed to discontinue Effexor, given that he feels like this has made his anxiety and mood worse, and we also agreed to discontinue Lamictal given that this has not provided him with any sustained benefit.  I spent time with the patient reviewing the risks and benefits of clomipramine, specifically CNS sedation, risk of serotonin syndrome, risk of GI side effects, dizziness.  Educated him on the benefits of tricyclic antidepressants given their significant potency and efficacy in treating mood, anxiety, panic.   We agreed to initiate clomipramine 25 mg 3 times daily, and I also recommended that the patient discontinue hydroxyzine given the significant anticholinergic effects.  I spent time with the patient reviewing some of the expected transition over the next 1-2 weeks as we make these changes,  and he will follow-up with this writer in 3 weeks for a med management check.  I also continued to recommend that he initiate individual therapy.  Visit Diagnosis:    ICD-10-CM   1. Persistent depressive disorder F34.1 clomiPRAMINE (ANAFRANIL) 25 MG capsule  2. GAD (generalized anxiety disorder) F41.1 propranolol ER (INDERAL LA) 60 MG 24 hr capsule    traZODone (DESYREL) 100 MG tablet  3. Social anxiety disorder F40.10 propranolol ER (INDERAL LA) 60 MG 24 hr capsule    traZODone (DESYREL) 100 MG tablet  4. Moderate episode of recurrent major depressive disorder ([place]) F33.1     Past Psychiatric History: See intake H&P for full details. Reviewed, with no updates at this time.   Past Medical History:  Past Medical History:  Diagnosis Date  . Anxiety   . Depression   . Elevated hemoglobin ([place])   . Low testosterone   . Small bowel obstruction ([place]) 10/21/2016    Past Surgical History:  Procedure Laterality Date  . NASAL SINUS SURGERY  02/2016    Family Psychiatric History: See intake H&P for full details. Reviewed, with no updates at this time.   Family History:  Family History  Problem Relation Age of Onset  . Cancer Mother        kidney  . Hypertension Father   . Diabetes Father   . Diabetes Brother   . Cancer Paternal Grandmother     Social History:  Social History   Socioeconomic History  . Marital status: Married    Spouse name: None  . Number of children: None  .  Years of education: None  . Highest education level: None  Social Needs  . Financial resource strain: None  . Food insecurity - worry: None  . Food insecurity - inability: None  . Transportation needs - medical: None  . Transportation needs - non-medical: None  Occupational History  . None  Tobacco Use  . Smoking status: Never Smoker  . Smokeless tobacco: Never Used  Substance and Sexual Activity  . Alcohol use: Yes    Comment: 10/22/2016 "nothing in over 1 year"  . Drug use: No  . Sexual  activity: Yes    Partners: Female    Birth control/protection: None  Other Topics Concern  . None  Social History Narrative  . None    Allergies:  Allergies  Allergen Reactions  . Amoxicillin Other (See Comments)    High fever and possible heart racing  . Bactrim [Sulfamethoxazole-Trimethoprim] Other (See Comments)    Fever, body aches, chills  . Penicillins Other (See Comments)    High fever Has patient had a PCN reaction causing immediate rash, facial/tongue/throat swelling, SOB or lightheadedness with hypotension: No Has patient had a PCN reaction causing severe rash involving mucus membranes or skin necrosis: No Has patient had a PCN reaction that required hospitalization No Has patient had a PCN reaction occurring within the last 10 years: No If all of the above answers are "NO", then may proceed with Cephalosporin use.     Metabolic Disorder Labs: Lab Results  Component Value Date   HGBA1C 4.9% 08/13/2013   Lab Results  Component Value Date   PROLACTIN 14.5 09/08/2015   PROLACTIN 16.4 (H) 06/14/2015   Lab Results  Component Value Date   CHOL 221 (H) 06/15/2016   TRIG 390 (H) 06/15/2016   HDL 25 (L) 06/15/2016   CHOLHDL 8.8 (H) 06/15/2016   LDLCALC 118 (H) 06/15/2016   LDLCALC 111 (H) 05/18/2014   Lab Results  Component Value Date   TSH 2.680 11/08/2016   TSH 2.520 05/02/2015    Therapeutic Level Labs: No results found for: LITHIUM No results found for: VALPROATE No components found for:  CBMZ  Current Medications: Current Outpatient Medications  Medication Sig Dispense Refill  . clomiPRAMINE (ANAFRANIL) 25 MG capsule Take 1 capsule (25 mg total) 3 (three) times daily by mouth. 90 capsule 2  . gabapentin (NEURONTIN) 300 MG capsule Take 2 capsules (600 mg total) by mouth 3 (three) times daily. 180 capsule 11  . HUMIRA PEN 40 MG/0.8ML PNKT     . propranolol ER (INDERAL LA) 60 MG 24 hr capsule Take 1 capsule (60 mg total) daily by mouth. 30 capsule 3   . sildenafil (REVATIO) 20 MG tablet TAKE TWO TO FIVE TABLETS BY MOUTH ONCE DAILY AS NEEDED 30 tablet 5  . testosterone cypionate (DEPOTESTOSTERONE CYPIONATE) 200 MG/ML injection Inject 0.5 mLs (100 mg total) into the muscle every 14 (fourteen) days. 10 mL 0  . traZODone (DESYREL) 100 MG tablet Take 1 tablet (100 mg total) at bedtime by mouth. 60 tablet 1   No current facility-administered medications for this visit.      Musculoskeletal: Strength & Muscle Tone: within normal limits Gait & Station: normal Patient leans: N/A  Psychiatric Specialty Exam: ROS  Blood pressure 136/78, pulse 79, height 6' 3" (1.905 m), weight 281 lb 12.8 oz (127.8 kg).Body mass index is 35.22 kg/m.  General Appearance: Casual and Fairly Groomed  Eye Contact:  Fair  Speech:  Clear and Coherent  Volume:  Decreased  Mood:  Anxious, Depressed and Dysphoric  Affect:  Congruent  Thought Process:  Goal Directed and Descriptions of Associations: Intact  Orientation:  Full (Time, Place, and Person)  Thought Content: Logical   Suicidal Thoughts:  No  Homicidal Thoughts:  No  Memory:  Immediate;   Fair  Judgement:  Fair  Insight:  Lacking  Psychomotor Activity:  Normal  Concentration:  Attention Span: Fair  Recall:  [employer] of Knowledge: Fair  Language: Fair  Akathisia:  Negative  Handed:  Right  AIMS (if indicated): not done  Assets:  Communication Skills Desire for Improvement Financial Resources/Insurance Housing Transportation  ADL's:  Intact  Cognition: WNL  Sleep:  Fair   Screenings: GAD-7     Office Visit from 11/08/2016 in [facility] Visit from 11/30/2015 in [facility] Visit from 09/28/2015 in [facility]  Total GAD-7 Score  _0 PHQ2-9     Office Visit from 11/08/2016 in [facility] Visit from 11/30/2015 in [facility] Visit from 10/06/2015  in [place] Visit from 09/28/2015 in [facility] Visit from 07/26/2014 in [place] [facility]  PHQ-2 Total Score  4  0  0  1  1  PHQ-9 Total Score  9  No data  No data  No data  No data       Assessment and Plan:  Paul Parrish continues with symptoms of social anxiety, persistent depressive disorder, panic, in the setting of generally poor coping.  He has failed myriad of SSRI and SNRI, and has failed ECT treatment in 2013.  I have spent time today with the patient educating him on the risks and benefits of tricyclic antidepressants, and ultimately we have agreed to proceed as below.  I have reviewed the risks of serotonin syndrome, and the side effects of both stopping and starting medications as below.  I would like to simplify his regimen, and also be sure that the regimen provides him with a solid benefit.  I continue to encourage him to engage in individual therapy which he is reluctant to start at this time, as evidenced by his lack of any initiative to make the appointment after multiple reminders to do so.  1. Persistent depressive disorder   2. GAD (generalized anxiety disorder)   3. Social anxiety disorder   4. Moderate episode of recurrent major depressive disorder ([facility])     Status of current problems: gradually worsening  Labs Ordered: No orders of the defined types were placed in this encounter.   Labs Reviewed: n/a  Collateral Obtained/Records Reviewed: n/a  Plan:  STOP Propranolol Immediate release STOP Hydroxyzine completely STOP Effexor completely STOP Adderall completely STOP Lamictal completely   START Propranolol long acting 60 mg once in the morning CONTINUE Trazodone 100-200 mg at night if needed - try to use 1 pill, or use melatonin instead CONTINUE Gabapentin 600 mg 3 times a day START Clomipramine 25 mg 3 times a day - start first dose tonight for bedtime  I spent 30 minutes  with the patient in direct face-to-face clinical care.  Greater than 50% of this time was spent in counseling and coordination of care with the patient.     Aundra Dubin, MD 08/18/2017, 2:44 PM

## 2017-08-18 NOTE — Patient Instructions (Addendum)
STOP Propranolol Immediate release STOP Hydroxyzine completely STOP Effexor completely STOP Adderall completely STOP Lamictal completely   START Propranolol long acting 60 mg once in the morning CONTINUE Trazodone 100-200 mg at night if needed - try to use 1 pill, or use melatonin CONTINUE Gabapentin 600 mg 3 times a day START Clomipramine 25 mg 3 times a day - start first dose tonight for bedtime  Gradually, we are going to move all the clomipramine to night-time The average effective dose of clomipramine is about 150 mg  - so we are going to move up over the next 4-6 weeks

## 2017-08-19 ENCOUNTER — Encounter: Payer: Self-pay | Admitting: Family Medicine

## 2017-08-25 DIAGNOSIS — L409 Psoriasis, unspecified: Secondary | ICD-10-CM | POA: Diagnosis not present

## 2017-08-25 DIAGNOSIS — D492 Neoplasm of unspecified behavior of bone, soft tissue, and skin: Secondary | ICD-10-CM | POA: Diagnosis not present

## 2017-08-25 DIAGNOSIS — L4050 Arthropathic psoriasis, unspecified: Secondary | ICD-10-CM | POA: Diagnosis not present

## 2017-08-29 ENCOUNTER — Encounter: Payer: Self-pay | Admitting: "Endocrinology

## 2017-09-08 DIAGNOSIS — G4733 Obstructive sleep apnea (adult) (pediatric): Secondary | ICD-10-CM | POA: Diagnosis not present

## 2017-09-09 ENCOUNTER — Ambulatory Visit (HOSPITAL_COMMUNITY): Payer: Self-pay | Admitting: Psychiatry

## 2017-09-09 ENCOUNTER — Other Ambulatory Visit (HOSPITAL_COMMUNITY): Payer: Self-pay | Admitting: Psychiatry

## 2017-09-09 DIAGNOSIS — F341 Dysthymic disorder: Secondary | ICD-10-CM

## 2017-09-09 DIAGNOSIS — F4010 Social phobia, unspecified: Secondary | ICD-10-CM

## 2017-09-09 DIAGNOSIS — F411 Generalized anxiety disorder: Secondary | ICD-10-CM

## 2017-09-09 MED ORDER — CLOMIPRAMINE HCL 50 MG PO CAPS: 50.0000 mg | ORAL_CAPSULE | Freq: Three times a day (TID) | ORAL | 2 refills | Status: DC

## 2017-09-09 MED ORDER — TRAZODONE HCL 100 MG PO TABS: 100.0000 mg | ORAL_TABLET | Freq: Every day | ORAL | 1 refills | Status: DC

## 2017-09-09 NOTE — Progress Notes (Signed)
Discussed with the patient over the phone.  He was not able to get out of his driveway this morning was wanting to do a phone check and if possible.  He reports that his mood has been substantially better on clomipramine.  He reports that he was a little bit sweaty at first but that has dissipated.  He confirms that he is taking propranolol, gabapentin, clomipramine, and uses trazodone at night.  He is no longer on any stimulant or benzodiazepine.  He reports that his wife is giving him excellent feedback, that his mood seems better, they have had a better relationship over the past couple weeks because he is in better spirits.  He denies any diarrhea, nausea, vomiting.  He reports that he sleeps well at night.  We discussed increasing to 50 mg 3 times daily for clomipramine, and he was agreeable to this.  He has a scheduled appointment with writer in February and will keep it as it is.  He will contact clinic if he needs to follow-up sooner.  He is aware that writer will be on paternity leave starting in about a week.

## 2017-09-24 ENCOUNTER — Encounter (HOSPITAL_COMMUNITY): Payer: Self-pay | Admitting: Psychiatry

## 2017-09-25 ENCOUNTER — Other Ambulatory Visit: Payer: Self-pay | Admitting: "Endocrinology

## 2017-10-06 ENCOUNTER — Encounter: Payer: Self-pay | Admitting: "Endocrinology

## 2017-10-07 ENCOUNTER — Telehealth: Payer: Self-pay

## 2017-10-07 ENCOUNTER — Encounter (HOSPITAL_COMMUNITY): Payer: Self-pay | Admitting: Psychiatry

## 2017-10-07 NOTE — Telephone Encounter (Signed)
It is a requirement for [place] to document labs to make sure patients are not experiencing adverse effects from testosterone therapy and regular follow-up office visits every 3-4 months is mandatory for proper care. We have not seen this patient since May 2018, hence, it is unsafe to just refill testosterone without labs and office visits.

## 2017-10-07 NOTE — Telephone Encounter (Signed)
-----   Message from [place], Generic sent at 10/07/2017 2:15 PM EST -----    It's hard for me to get labs done with my work schedule and my daughter being terminally Ill. I haven't had my Testosterone shot in 2 months. Why won't Dr Dorris Fetch give me a refill for this? [facility] contacted him but he denied.  ----- Message -----  From: Nurse Elby Showers  Sent: 10/06/2017 11:29 AM EST  To: Paul Parrish  Subject: RE: Non-Urgent Medical Question  Orders are in the computer system to [facility]. They should be able to pull these up electronically    ----- Message -----   From: Paul Parrish   Sent: 10/06/2017 11:18 AM EST    To: Glade Lloyd, MD  Subject: Non-Urgent Medical Question    Is my lab order still sent to [facility] on [facility] in [place]? I will get my labs done there?

## 2017-10-09 ENCOUNTER — Encounter (HOSPITAL_COMMUNITY): Payer: Self-pay | Admitting: Psychiatry

## 2017-10-09 DIAGNOSIS — G4733 Obstructive sleep apnea (adult) (pediatric): Secondary | ICD-10-CM | POA: Diagnosis not present

## 2017-10-09 DIAGNOSIS — E291 Testicular hypofunction: Secondary | ICD-10-CM | POA: Diagnosis not present

## 2017-10-10 ENCOUNTER — Encounter: Payer: Self-pay | Admitting: Family Medicine

## 2017-10-10 DIAGNOSIS — Z0000 Encounter for general adult medical examination without abnormal findings: Secondary | ICD-10-CM

## 2017-10-10 LAB — PROLACTIN: Prolactin: 12.4 ng/mL (ref 4.0–15.2)

## 2017-10-10 LAB — TESTOSTERONE, FREE, TOTAL, SHBG
Sex Hormone Binding: 29.2 nmol/L (ref 16.5–55.9)
TESTOSTERONE: 168 ng/dL — AB (ref 264–916)
Testosterone, Free: 5.2 pg/mL — ABNORMAL LOW (ref 6.8–21.5)

## 2017-10-10 LAB — T4, FREE: FREE T4: 1.08 ng/dL (ref 0.82–1.77)

## 2017-10-10 LAB — TSH: TSH: 3.13 u[IU]/mL (ref 0.450–4.500)

## 2017-10-13 NOTE — Addendum Note (Signed)
Addended by: Timmothy Euler on: 10/13/2017 04:03 PM   Modules accepted: Orders

## 2017-10-15 ENCOUNTER — Encounter: Payer: Self-pay | Admitting: "Endocrinology

## 2017-10-15 ENCOUNTER — Ambulatory Visit (INDEPENDENT_AMBULATORY_CARE_PROVIDER_SITE_OTHER): Payer: 59 | Admitting: "Endocrinology

## 2017-10-15 VITALS — BP 126/88 | HR 76 | Ht 75.0 in | Wt 281.0 lb

## 2017-10-15 DIAGNOSIS — E291 Testicular hypofunction: Secondary | ICD-10-CM

## 2017-10-15 DIAGNOSIS — E6609 Other obesity due to excess calories: Secondary | ICD-10-CM | POA: Diagnosis not present

## 2017-10-15 DIAGNOSIS — Z6833 Body mass index (BMI) 33.0-33.9, adult: Secondary | ICD-10-CM | POA: Diagnosis not present

## 2017-10-15 DIAGNOSIS — D751 Secondary polycythemia: Secondary | ICD-10-CM

## 2017-10-15 MED ORDER — TESTOSTERONE CYPIONATE 200 MG/ML IM SOLN: 100.0000 mg | INTRAMUSCULAR | 0 refills | Status: DC

## 2017-10-15 MED ORDER — "SYRINGE 20G X 1"" 3 ML MISC": 0 refills | Status: DC

## 2017-10-15 NOTE — Progress Notes (Signed)
HPI: Paul Parrish is a 47 y.o.-year-old man. - After missing his several last appointments from May 2018, he returns for follow-up of hypogonadism. - He was kept on 100 mg of testosterone IM every other week during his last visit. He says he took it until he ran out in October 2018. - He was diagnosed with hypogonadism in 2014, etiology not clear. - He denies any significant history of testicular injury, infection, radiation, systemic chemotherapy, use of androgen's/steroids, and opioids. -He reports history of seizures as a young man until his teenage years. -He denies any history of head injury.  -Over the years, he was observed to have secondary polycythemia for which he is undergoing periodic phlebotomy. -His recent labs showed increased levels of red blood cells, hemoglobin, and hematocrit. - He also is recently diagnosed with sleep apnea currently on CPAP.   - He fathers 2 teenage boys biologically. -In September 2016 he did have mildly elevated prolactin, which has corrected since. - He denies low libido, however reports chronic fatigue. Has difficulty obtaining or maintaining an erection, currently on sildenafil 20 mg when necessary.  No report of changing headaches. No FH of hypogonadism/infertility . - He is progressively gained weight. No chronic diseases. - He has significant mood disorder on multiple antidepressants and antianxiety. No herbal medicines. -He denies family history of premature coronary artery disease.   ROS: Constitutional: no weight gain, + fatigue, no subjective hyperthermia/hypothermia.  Eyes: no blurry vision, no xerophthalmia ENT: no sore throat, no nodules palpated in throat, no dysphagia/odynophagia, no hoarseness Cardiovascular: no CP/SOB/palpitations/leg swelling Respiratory:  + Snoring, no cough/SOB Gastrointestinal: no N/V/D/C Musculoskeletal: no muscle/joint aches Skin: no rashes Neurological: no  tremors/numbness/tingling/dizziness Psychiatric: + depression, +anxiety  PE: BP 126/88   Pulse 76   Ht 6\' 3"  (1.905 m)   Wt 281 lb (127.5 kg)   BMI 35.12 kg/m  Wt Readings from Last 3 Encounters:  10/15/17 281 lb (127.5 kg)  06/12/17 275 lb (124.7 kg)  04/15/17 270 lb (122.5 kg)   Constitutional:  Obese, not in acute distress. Eyes: PERRLA, EOMI, no exophthalmos ENT: moist mucous membranes, no thyromegaly, no cervical lymphadenopathy Cardiovascular: RRR, No MRG Respiratory: CTA B Gastrointestinal: abdomen soft, NT, ND, BS+ Musculoskeletal: no deformities, strength intact in all 4 Skin: moist, warm, diffuse psoriatic  Rashes on bilateral upper extremities . Neurological: no tremor with outstretched hands, DTR normal in all 4 Genital exam: normal male escutcheon, no inguinal LAD, normal phallus, testes ~25 mL bilaterally, no testicular masses, circumcised male, no penile discharge.  No gynecomastia.     Recent Results (from the past 2160 hour(s))  Testosterone, Free, Total, SHBG     Status: Abnormal   Collection Time: 10/09/17  8:52 AM  Result Value Ref Range   Testosterone 168 (L) 264 - 916 ng/dL    Comment: Adult male reference interval is based on a population of healthy nonobese males (BMI <30) between 79 and 13 years old. [place], [place] [id number]. PMID: [id number].    Testosterone, Free 5.2 (L) 6.8 - 21.5 pg/mL   Sex Hormone Binding 29.2 16.5 - 55.9 nmol/L  Prolactin     Status: None   Collection Time: 10/09/17  8:52 AM  Result Value Ref Range   Prolactin 12.4 4.0 - 15.2 ng/mL  TSH     Status: None   Collection Time: 10/09/17  8:52 AM  Result Value Ref Range   TSH 3.130 0.450 - 4.500 uIU/mL  T4, free     Status: None  Collection Time: 10/09/17  8:52 AM  Result Value Ref Range   Free T4 1.08 0.82 - 1.77 ng/dL      ASSESSMENT: 1.  Hypogonadism  2. Erectile Dysfunction  PLAN:  1. Hypogonadism  -  after more than 2 months off of  testosterone supplement, his total testosterone was low at 168. He wishes to be treated with insulin again. - I have discussed diagnosis of hypogonadism, safe use of testosterone,   and relevant workup of hypogonadism with him. -  I had a long discussion with the patient regarding his testosterone results, which we reviewed together.  I  discussed adverse effects of unnecessary testosterone replacement short-term and long-term- which is evident already causing secondary polycythemia and in relation to his sleep apnea. It   might even be harmful as per the latest studies that showed increased cardiac risk with testosterone supplementation.  - His main complaint of fatigue could be the result of sleep apnea which indirectly could be made worse by supraphysiologic testosterone, and would not improve with higher dose of testosterone. - This will improve with measures targeted to improving his sleep, diet and exercise .   - Since he wishes to be continued on testosterone replacement, I will restart with low-dose testosterone: 100 mg intramuscularly every other week  with a plan to  repeat   testosterone, CBC, PSA levels in 3 months with office visit. - His prolactin level is back to normal, as well as his normal thyroid function test.   Glade Lloyd, MD Phone: [phone]  Fax: [phone]   10/15/2017, 1:35 PM

## 2017-10-16 NOTE — Addendum Note (Signed)
Addended by: Timmothy Euler on: 10/16/2017 04:45 PM   Modules accepted: Orders

## 2017-10-17 ENCOUNTER — Encounter (HOSPITAL_COMMUNITY): Payer: Self-pay | Admitting: Psychiatry

## 2017-10-17 ENCOUNTER — Other Ambulatory Visit (HOSPITAL_COMMUNITY): Payer: Self-pay | Admitting: Psychiatry

## 2017-10-17 DIAGNOSIS — F341 Dysthymic disorder: Secondary | ICD-10-CM

## 2017-10-17 MED ORDER — CLOMIPRAMINE HCL 75 MG PO CAPS: 75.0000 mg | ORAL_CAPSULE | Freq: Three times a day (TID) | ORAL | 2 refills | Status: DC

## 2017-10-18 ENCOUNTER — Other Ambulatory Visit: Payer: 59

## 2017-10-18 DIAGNOSIS — Z0000 Encounter for general adult medical examination without abnormal findings: Secondary | ICD-10-CM | POA: Diagnosis not present

## 2017-10-18 DIAGNOSIS — G4733 Obstructive sleep apnea (adult) (pediatric): Secondary | ICD-10-CM

## 2017-10-19 LAB — CMP14+EGFR
ALBUMIN: 4.5 g/dL (ref 3.5–5.5)
ALK PHOS: 90 IU/L (ref 39–117)
ALT: 81 IU/L — AB (ref 0–44)
AST: 37 IU/L (ref 0–40)
Albumin/Globulin Ratio: 1.5 (ref 1.2–2.2)
BILIRUBIN TOTAL: 0.5 mg/dL (ref 0.0–1.2)
BUN/Creatinine Ratio: 9 (ref 9–20)
BUN: 10 mg/dL (ref 6–24)
CHLORIDE: 101 mmol/L (ref 96–106)
CO2: 24 mmol/L (ref 20–29)
Calcium: 9.3 mg/dL (ref 8.7–10.2)
Creatinine, Ser: 1.12 mg/dL (ref 0.76–1.27)
GFR, EST AFRICAN AMERICAN: 91 mL/min/{1.73_m2} (ref >59)
GFR, EST NON AFRICAN AMERICAN: 78 mL/min/{1.73_m2} (ref >59)
GLOBULIN, TOTAL: 3 g/dL (ref 1.5–4.5)
Glucose: 97 mg/dL (ref 65–99)
POTASSIUM: 4.6 mmol/L (ref 3.5–5.2)
Sodium: 139 mmol/L (ref 134–144)
Total Protein: 7.5 g/dL (ref 6.0–8.5)

## 2017-10-19 LAB — LIPID PANEL
CHOLESTEROL TOTAL: 202 mg/dL — AB (ref 100–199)
Chol/HDL Ratio: 8.4 ratio — ABNORMAL HIGH (ref 0.0–5.0)
HDL: 24 mg/dL — ABNORMAL LOW (ref >39)
LDL CALC: 113 mg/dL — AB (ref 0–99)
TRIGLYCERIDES: 323 mg/dL — AB (ref 0–149)
VLDL CHOLESTEROL CAL: 65 mg/dL — AB (ref 5–40)

## 2017-10-19 LAB — CBC WITH DIFFERENTIAL/PLATELET
BASOS: 0 %
Basophils Absolute: 0 10*3/uL (ref 0.0–0.2)
EOS (ABSOLUTE): 0.1 10*3/uL (ref 0.0–0.4)
Eos: 2 %
Hematocrit: 52.4 % — ABNORMAL HIGH (ref 37.5–51.0)
Hemoglobin: 17.8 g/dL — ABNORMAL HIGH (ref 13.0–17.7)
Immature Grans (Abs): 0 10*3/uL (ref 0.0–0.1)
Immature Granulocytes: 0 %
Lymphocytes Absolute: 2.6 10*3/uL (ref 0.7–3.1)
Lymphs: 41 %
MCH: 30.8 pg (ref 26.6–33.0)
MCHC: 34 g/dL (ref 31.5–35.7)
MCV: 91 fL (ref 79–97)
MONOS ABS: 0.6 10*3/uL (ref 0.1–0.9)
Monocytes: 10 %
NEUTROS ABS: 3 10*3/uL (ref 1.4–7.0)
Neutrophils: 47 %
PLATELETS: 228 10*3/uL (ref 150–379)
RBC: 5.78 x10E6/uL (ref 4.14–5.80)
RDW: 15.5 % — AB (ref 12.3–15.4)
WBC: 6.4 10*3/uL (ref 3.4–10.8)

## 2017-10-20 LAB — BAYER DCA HB A1C WAIVED: HB A1C: 5.7 % (ref <7.0)

## 2017-10-22 ENCOUNTER — Encounter: Payer: Self-pay | Admitting: Family Medicine

## 2017-10-22 ENCOUNTER — Ambulatory Visit: Payer: 59 | Admitting: Family Medicine

## 2017-10-22 VITALS — BP 120/70 | HR 88 | Temp 97.0°F | Ht 75.0 in | Wt 288.0 lb

## 2017-10-22 DIAGNOSIS — R945 Abnormal results of liver function studies: Secondary | ICD-10-CM | POA: Diagnosis not present

## 2017-10-22 DIAGNOSIS — D751 Secondary polycythemia: Secondary | ICD-10-CM

## 2017-10-22 DIAGNOSIS — R7989 Other specified abnormal findings of blood chemistry: Secondary | ICD-10-CM

## 2017-10-22 DIAGNOSIS — E781 Pure hyperglyceridemia: Secondary | ICD-10-CM

## 2017-10-22 NOTE — Progress Notes (Signed)
   HPI  Patient presents today here to follow-up for chronic medical conditions.  Patient previously had secondary polycythemia due to testosterone replacement. He has restarted testosterone replacement with endocrinology and has started a regular schedule of getting blood to the [facility].  Elevated LFTs Patient denies alcohol or frequent Tylenol use He has gained some weight.  Hypertriglyceridemia Discussed with patient, he is not really watching his diet, he currently has a stepdaughter who has cancer and he has had difficult time eating fresh food around frequent visits.   PMH: Smoking status noted ROS: Per HPI  Objective: BP 120/70   Pulse 88   Temp (!) 97 F (36.1 C) (Oral)   Ht '6\' 3"'$  (1.905 m)   Wt 288 lb (130.6 kg)   BMI 36.00 kg/m  Gen: NAD, alert, cooperative with exam HEENT: NCAT CV: RRR, good S1/S2, no murmur Resp: CTABL, no wheezes, non-labored Ext: No edema, warm Neuro: Alert and oriented, No gross deficits  Assessment and plan:  #Hypertriglyceridemia Discussed therapeutic lifestyle changes No medications necessary  #Elevated LFTs Unclear etiology, possibly fatty liver Repeat in 2-3 months, if still elevated would recommend ultrasound*  #Polycythemia, secondary Secondary to testosterone replacement, patient stopped this for a time while he was transitioning to endocrinology, his hemoglobin is back down to a safer level I recommended aggressive scheduled blood donation   Orders Placed This Encounter  Procedures  . [id number]    Standing Status:   Future    Standing Expiration Date:   10/22/2018     Laroy Apple, MD [facility] 10/22/2017, 4:48 PM

## 2017-10-22 NOTE — Patient Instructions (Signed)
Great to see you!  Come back in 6 months unless you need [place] sooner.   Be sur eto have your blood checked in 2-3 months as discussed

## 2017-10-23 ENCOUNTER — Encounter (HOSPITAL_COMMUNITY): Payer: Self-pay | Admitting: Psychiatry

## 2017-11-06 ENCOUNTER — Encounter (HOSPITAL_COMMUNITY): Payer: Self-pay | Admitting: Psychiatry

## 2017-11-07 ENCOUNTER — Other Ambulatory Visit (HOSPITAL_COMMUNITY): Payer: Self-pay | Admitting: Psychiatry

## 2017-11-07 DIAGNOSIS — F341 Dysthymic disorder: Secondary | ICD-10-CM

## 2017-11-07 MED ORDER — CLOMIPRAMINE HCL 25 MG PO CAPS: 25.0000 mg | ORAL_CAPSULE | Freq: Three times a day (TID) | ORAL | 2 refills | Status: DC

## 2017-11-09 DIAGNOSIS — G4733 Obstructive sleep apnea (adult) (pediatric): Secondary | ICD-10-CM | POA: Diagnosis not present

## 2017-11-18 ENCOUNTER — Ambulatory Visit (INDEPENDENT_AMBULATORY_CARE_PROVIDER_SITE_OTHER): Payer: 59 | Admitting: Psychiatry

## 2017-11-18 ENCOUNTER — Encounter (HOSPITAL_COMMUNITY): Payer: Self-pay | Admitting: Psychiatry

## 2017-11-18 DIAGNOSIS — Z79899 Other long term (current) drug therapy: Secondary | ICD-10-CM | POA: Diagnosis not present

## 2017-11-18 DIAGNOSIS — F4010 Social phobia, unspecified: Secondary | ICD-10-CM | POA: Diagnosis not present

## 2017-11-18 DIAGNOSIS — G4733 Obstructive sleep apnea (adult) (pediatric): Secondary | ICD-10-CM | POA: Diagnosis not present

## 2017-11-18 DIAGNOSIS — F411 Generalized anxiety disorder: Secondary | ICD-10-CM

## 2017-11-18 DIAGNOSIS — F341 Dysthymic disorder: Secondary | ICD-10-CM

## 2017-11-18 MED ORDER — CITALOPRAM HYDROBROMIDE 20 MG PO TABS: 20.0000 mg | ORAL_TABLET | ORAL | 1 refills | Status: DC

## 2017-11-18 MED ORDER — CLOMIPRAMINE HCL 25 MG PO CAPS: 25.0000 mg | ORAL_CAPSULE | Freq: Three times a day (TID) | ORAL | 1 refills | Status: DC

## 2017-11-18 MED ORDER — TRAZODONE HCL 100 MG PO TABS: 100.0000 mg | ORAL_TABLET | Freq: Every day | ORAL | 1 refills | Status: DC

## 2017-11-18 MED ORDER — PROPRANOLOL HCL ER 60 MG PO CP24: 60.0000 mg | ORAL_CAPSULE | Freq: Every day | ORAL | 1 refills | Status: DC

## 2017-11-18 NOTE — Progress Notes (Signed)
[place] MD/PA/NP OP Progress Note  11/18/2017 2:35 PM Paul Parrish  MRN:  [id number]  Chief Complaint: med check HPI: Paul Parrish reports that his mood and depression have been generally much better with the clomipramine and treated sleep apnea.  He continues to have substantial issues with social anxiety, and issues of agoraphobia.  He reports that he tends to avoid public places, and its difficult for him to get out of the home, even though he knows he needs to.  He continues to attend work regularly and has been able to attend healthcare visits with his daughter.  He reports that he gets quite sweaty, anxious, and the propranolol helps to some degree but not fully.  Going up on clomipramine caused significant sexual side effects and he did not notice any substantial improvement in anxiety or mood to outweigh the sexual side effects.  He was also quite sedated.  We discussed initiating Celexa with the continued clomipramine 25 mg 3 times daily, and reviewed the risks and benefits of SSRI plus tricyclic.  I educated him on the risk of serotonin syndrome and the symptoms of this.  We agreed to decrease gabapentin and discontinue given that this continues to be without any clear benefit.  Reiterated his medication regimen as below, and reiterated the value of cognitive behavioral therapy.  He reports that while he would like to do therapy, cost and time are impairing.  I suggested he consider some cognitive behavioral therapy workbooks and he was much more open to considering some self-help books and reading material.  Visit Diagnosis:    ICD-10-CM   1. GAD (generalized anxiety disorder) F41.1 propranolol ER (INDERAL LA) 60 MG 24 hr capsule    traZODone (DESYREL) 100 MG tablet    citalopram (CELEXA) 20 MG tablet  2. Social anxiety disorder F40.10 propranolol ER (INDERAL LA) 60 MG 24 hr capsule    traZODone (DESYREL) 100 MG tablet    citalopram (CELEXA) 20 MG tablet  3. Persistent depressive disorder  F34.1 clomiPRAMINE (ANAFRANIL) 25 MG capsule    citalopram (CELEXA) 20 MG tablet    Past Psychiatric History: See intake H&P for full details. Reviewed, with no updates at this time.   Past Medical History:  Past Medical History:  Diagnosis Date  . Anxiety   . Depression   . Elevated hemoglobin ([place])   . Low testosterone   . Small bowel obstruction ([place]) 10/21/2016    Past Surgical History:  Procedure Laterality Date  . NASAL SINUS SURGERY  02/2016    Family Psychiatric History: See intake H&P for full details. Reviewed, with no updates at this time.   Family History:  Family History  Problem Relation Age of Onset  . Cancer Mother        kidney  . Hypertension Father   . Diabetes Father   . Diabetes Brother   . Cancer Paternal Grandmother     Social History:  Social History   Socioeconomic History  . Marital status: Married    Spouse name: None  . Number of children: None  . Years of education: None  . Highest education level: None  Social Needs  . Financial resource strain: None  . Food insecurity - worry: None  . Food insecurity - inability: None  . Transportation needs - medical: None  . Transportation needs - non-medical: None  Occupational History  . None  Tobacco Use  . Smoking status: Never Smoker  . Smokeless tobacco: Never Used  Substance and Sexual Activity  .  Alcohol use: Yes    Comment: 10/22/2016 "nothing in over 1 year"  . Drug use: No  . Sexual activity: Yes    Partners: Female    Birth control/protection: None  Other Topics Concern  . None  Social History Narrative  . None    Allergies:  Allergies  Allergen Reactions  . Amoxicillin Other (See Comments)    High fever and possible heart racing  . Bactrim [Sulfamethoxazole-Trimethoprim] Other (See Comments)    Fever, body aches, chills  . Penicillins Other (See Comments)    High fever Has patient had a PCN reaction causing immediate rash, facial/tongue/throat swelling, SOB or  lightheadedness with hypotension: No Has patient had a PCN reaction causing severe rash involving mucus membranes or skin necrosis: No Has patient had a PCN reaction that required hospitalization No Has patient had a PCN reaction occurring within the last 10 years: No If all of the above answers are "NO", then may proceed with Cephalosporin use.     Metabolic Disorder Labs: Lab Results  Component Value Date   HGBA1C 4.9% 08/13/2013   Lab Results  Component Value Date   PROLACTIN 12.4 10/09/2017   PROLACTIN 14.5 09/08/2015   Lab Results  Component Value Date   CHOL 202 (H) 10/18/2017   TRIG 323 (H) 10/18/2017   HDL 24 (L) 10/18/2017   CHOLHDL 8.4 (H) 10/18/2017   LDLCALC 113 (H) 10/18/2017   LDLCALC 118 (H) 06/15/2016   Lab Results  Component Value Date   TSH 3.130 10/09/2017   TSH 2.680 11/08/2016    Therapeutic Level Labs: No results found for: LITHIUM No results found for: VALPROATE No components found for:  CBMZ  Current Medications: Current Outpatient Medications  Medication Sig Dispense Refill  . clomiPRAMINE (ANAFRANIL) 25 MG capsule Take 1 capsule (25 mg total) by mouth 3 (three) times daily. 270 capsule 1  . gabapentin (NEURONTIN) 300 MG capsule Take 2 capsules (600 mg total) by mouth 3 (three) times daily. 180 capsule 11  . propranolol ER (INDERAL LA) 60 MG 24 hr capsule Take 1 capsule (60 mg total) by mouth daily. 90 capsule 1  . sildenafil (REVATIO) 20 MG tablet TAKE TWO TO FIVE TABLETS BY MOUTH ONCE DAILY AS NEEDED 30 tablet 5  . Syringe/Needle, Disp, (SYRINGE 3CC/20GX1") 20G X 1" 3 ML MISC Use one to inject Testosterone every 14 days. 50 each 0  . testosterone cypionate (DEPOTESTOSTERONE CYPIONATE) 200 MG/ML injection Inject 0.5 mLs (100 mg total) into the muscle every 14 (fourteen) days. 10 mL 0  . traZODone (DESYREL) 100 MG tablet Take 1 tablet (100 mg total) by mouth at bedtime. 90 tablet 1  . citalopram (CELEXA) 20 MG tablet Take 1 tablet (20 mg total)  by mouth every morning. Take 1/2 table daily for 1 week, then increase to whole tablet 90 tablet 1   No current facility-administered medications for this visit.      Musculoskeletal: Strength & Muscle Tone: within normal limits Gait & Station: normal Patient leans: N/A  Psychiatric Specialty Exam: ROS  Blood pressure 132/86, pulse 76, height 6\' 2"  (1.88 m), weight 283 lb 12.8 oz (128.7 kg).Body mass index is 36.44 kg/m.  General Appearance: Casual and Fairly Groomed  Eye Contact:  Good  Speech:  Clear and Coherent and Normal Rate  Volume:  Normal  Mood:  Anxious and Euthymic  Affect:  Appropriate and Congruent  Thought Process:  Goal Directed and Descriptions of Associations: Intact  Orientation:  Full (Time, Place, and  Person)  Thought Content: Logical   Suicidal Thoughts:  No  Homicidal Thoughts:  No  Memory:  Immediate;   Good  Judgement:  Fair  Insight:  Fair  Psychomotor Activity:  Normal  Concentration:  Concentration: Good  Recall:  Good  Fund of Knowledge: Good  Language: Good  Akathisia:  Negative  Handed:  Right  AIMS (if indicated): not done  Assets:  Communication Skills Desire for Improvement Financial Resources/Insurance Housing Intimacy Social Support Talents/Skills Transportation Vocational/Educational  ADL's:  Intact  Cognition: WNL  Sleep:  Good   Screenings: GAD-7     Office Visit from 11/08/2016 in [facility] Visit from 11/30/2015 in [facility] Visit from 09/28/2015 in [facility]  Total GAD-7 Score  16  13  11     PHQ2-9     Office Visit from 10/22/2017 in [facility] Visit from 10/15/2017 in [facility] Office Visit from 11/08/2016 in [facility] Visit from 11/30/2015 in [facility] Visit from 10/06/2015 in [facility]  PHQ-2 Total  Score  0  0  4  0  0  PHQ-9 Total Score  No data  No data  9  No data  No data       Assessment and Plan:  Gwynn Crossley presents for medication management follow-up.  Clomipramine has been well tolerated at a low dose for treatment of depressive symptoms and acute anxiety.  He no longer has panic attacks on any regular basis.  He was not able to tolerate a dose increase due to sexual side effects and sedation.  Propranolol has been helpful in reducing the physiologic effects of anxiety and episodes of sweating and tachycardia.  We agreed to initiate Celexa for a more typical approach to treating generalized anxiety and social anxiety in conjunction with clomipramine.  We agreed to taper gabapentin given lack of any clear benefit.  He does not have any acute safety issues and tends to keep in touch with this Probation officer with updates on his mental health via mychart.  I educated him on the value and importance of cognitive behavioral therapy and he is willing to consider some self-help books at this time, but not office-based therapy given lack of time and financial concerns.  1. GAD (generalized anxiety disorder)   2. Social anxiety disorder   3. Persistent depressive disorder     Status of current problems: gradually improving  Labs Ordered: No orders of the defined types were placed in this encounter.   Labs Reviewed: n/a  Collateral Obtained/Records Reviewed: n/a  Plan:  Taper gabapentin and discontinue Continue clomipramine 25 mg 3 times daily Celexa 10 mg, increase to 20 mg in 1 week Continue propranolol LA 60 mg Continue trazodone 100 mg nightly rtc 10 weeks  I spent 25 minutes with the patient in direct face-to-face clinical care.  Greater than 50% of this time was spent in counseling and coordination of care with the patient.    Aundra Dubin, MD 11/18/2017, 2:35 PM

## 2017-11-18 NOTE — Patient Instructions (Signed)
Decrease gabapentin to 1 capsule 3 times per day, for 1 week, then stop  Start Celexa 10 mg (1/2 tablet) then increase to 1 tablet in 1 week  Continue clomipramine, trazodone as they are  Continue Propranolol 60 mg LA  Consider reading some cognitive behavioral therapy books about anxiety management

## 2017-11-21 ENCOUNTER — Encounter (HOSPITAL_COMMUNITY): Payer: Self-pay | Admitting: Psychiatry

## 2017-12-03 ENCOUNTER — Encounter (HOSPITAL_COMMUNITY): Payer: Self-pay | Admitting: Psychiatry

## 2017-12-04 ENCOUNTER — Other Ambulatory Visit (HOSPITAL_COMMUNITY): Payer: Self-pay | Admitting: Psychiatry

## 2017-12-04 DIAGNOSIS — F341 Dysthymic disorder: Secondary | ICD-10-CM

## 2017-12-04 DIAGNOSIS — F4010 Social phobia, unspecified: Secondary | ICD-10-CM

## 2017-12-04 DIAGNOSIS — F411 Generalized anxiety disorder: Secondary | ICD-10-CM

## 2017-12-04 MED ORDER — CITALOPRAM HYDROBROMIDE 40 MG PO TABS: 40.0000 mg | ORAL_TABLET | ORAL | 0 refills | Status: DC

## 2017-12-07 DIAGNOSIS — G4733 Obstructive sleep apnea (adult) (pediatric): Secondary | ICD-10-CM | POA: Diagnosis not present

## 2017-12-24 ENCOUNTER — Encounter (HOSPITAL_COMMUNITY): Payer: Self-pay | Admitting: Psychiatry

## 2017-12-24 ENCOUNTER — Other Ambulatory Visit: Payer: Self-pay | Admitting: "Endocrinology

## 2017-12-24 ENCOUNTER — Encounter: Payer: Self-pay | Admitting: "Endocrinology

## 2017-12-24 DIAGNOSIS — E291 Testicular hypofunction: Secondary | ICD-10-CM

## 2017-12-25 ENCOUNTER — Encounter (HOSPITAL_COMMUNITY): Payer: Self-pay | Admitting: Psychiatry

## 2017-12-30 DIAGNOSIS — E291 Testicular hypofunction: Secondary | ICD-10-CM | POA: Diagnosis not present

## 2017-12-31 LAB — CBC WITH DIFFERENTIAL/PLATELET
BASOS: 1 %
Basophils Absolute: 0.1 10*3/uL (ref 0.0–0.2)
EOS (ABSOLUTE): 0.1 10*3/uL (ref 0.0–0.4)
EOS: 2 %
HEMATOCRIT: 49.3 % (ref 37.5–51.0)
Hemoglobin: 17.2 g/dL (ref 13.0–17.7)
Immature Grans (Abs): 0 10*3/uL (ref 0.0–0.1)
Immature Granulocytes: 0 %
LYMPHS ABS: 1.9 10*3/uL (ref 0.7–3.1)
Lymphs: 38 %
MCH: 31.4 pg (ref 26.6–33.0)
MCHC: 34.9 g/dL (ref 31.5–35.7)
MCV: 90 fL (ref 79–97)
Monocytes Absolute: 0.5 10*3/uL (ref 0.1–0.9)
Monocytes: 10 %
NEUTROS PCT: 49 %
Neutrophils Absolute: 2.5 10*3/uL (ref 1.4–7.0)
PLATELETS: 228 10*3/uL (ref 150–379)
RBC: 5.47 x10E6/uL (ref 4.14–5.80)
RDW: 14.8 % (ref 12.3–15.4)
WBC: 5 10*3/uL (ref 3.4–10.8)

## 2017-12-31 LAB — HEPATIC FUNCTION PANEL
ALT: 36 IU/L (ref 0–44)
AST: 26 IU/L (ref 0–40)
Albumin: 4.2 g/dL (ref 3.5–5.5)
Alkaline Phosphatase: 72 IU/L (ref 39–117)
Bilirubin Total: 0.5 mg/dL (ref 0.0–1.2)
Bilirubin, Direct: 0.17 mg/dL (ref 0.00–0.40)
Total Protein: 6.9 g/dL (ref 6.0–8.5)

## 2017-12-31 LAB — PSA: Prostate Specific Ag, Serum: 0.5 ng/mL (ref 0.0–4.0)

## 2017-12-31 LAB — TESTOSTERONE: TESTOSTERONE: 89 ng/dL — AB (ref 264–916)

## 2018-01-07 DIAGNOSIS — G4733 Obstructive sleep apnea (adult) (pediatric): Secondary | ICD-10-CM | POA: Diagnosis not present

## 2018-01-12 ENCOUNTER — Encounter (HOSPITAL_COMMUNITY): Payer: Self-pay | Admitting: Psychiatry

## 2018-01-12 ENCOUNTER — Ambulatory Visit (HOSPITAL_COMMUNITY): Payer: 59 | Admitting: Psychiatry

## 2018-01-12 VITALS — BP 142/90 | HR 79 | Ht 74.0 in | Wt 283.0 lb

## 2018-01-12 DIAGNOSIS — F331 Major depressive disorder, recurrent, moderate: Secondary | ICD-10-CM | POA: Diagnosis not present

## 2018-01-12 DIAGNOSIS — F4312 Post-traumatic stress disorder, chronic: Secondary | ICD-10-CM | POA: Diagnosis not present

## 2018-01-12 DIAGNOSIS — F341 Dysthymic disorder: Secondary | ICD-10-CM | POA: Diagnosis not present

## 2018-01-12 DIAGNOSIS — R5382 Chronic fatigue, unspecified: Secondary | ICD-10-CM | POA: Diagnosis not present

## 2018-01-12 DIAGNOSIS — F4010 Social phobia, unspecified: Secondary | ICD-10-CM

## 2018-01-12 DIAGNOSIS — F411 Generalized anxiety disorder: Secondary | ICD-10-CM | POA: Diagnosis not present

## 2018-01-12 MED ORDER — GABAPENTIN 300 MG PO CAPS: 600.0000 mg | ORAL_CAPSULE | Freq: Three times a day (TID) | ORAL | 6 refills | Status: DC

## 2018-01-12 MED ORDER — CITALOPRAM HYDROBROMIDE 40 MG PO TABS: 40.0000 mg | ORAL_TABLET | ORAL | 1 refills | Status: DC

## 2018-01-12 MED ORDER — BUPROPION HCL 75 MG PO TABS: 75.0000 mg | ORAL_TABLET | Freq: Two times a day (BID) | ORAL | 1 refills | Status: DC

## 2018-01-12 NOTE — Progress Notes (Signed)
[facility] MD/PA/NP OP Progress Note  01/12/2018 10:52 AM Paul Parrish  MRN:  [id number]  Chief Complaint:    HPI: Md Smola reports anxiety/mood with good control. Ongoing issue is fatigue, low energy. Uses nasal pillow CPAP nightly with good benefits. Complicating factors include ongoing low testosterone approximately 80-90.  pcp is getting MRI brain for further investigation.    Discussed agumenting current regimen with wellbutrin IR for energy BID. Confirms no history of seizures/epilepsy. Will proceed as below and follow-up in 2 months.  Visit Diagnosis:    ICD-10-CM   1. Chronic post-traumatic stress disorder (PTSD) F43.12 buPROPion (WELLBUTRIN) 75 MG tablet  2. GAD (generalized anxiety disorder) F41.1 buPROPion (WELLBUTRIN) 75 MG tablet    citalopram (CELEXA) 40 MG tablet  3. Moderate episode of recurrent major depressive disorder (HCC) F33.1 buPROPion (WELLBUTRIN) 75 MG tablet  4. Chronic fatigue R53.82 buPROPion (WELLBUTRIN) 75 MG tablet  5. Social anxiety disorder F40.10 citalopram (CELEXA) 40 MG tablet  6. Persistent depressive disorder F34.1 citalopram (CELEXA) 40 MG tablet    Past Psychiatric History: See intake H&P for full details. Reviewed, with no updates at this time.   Past Medical History:  Past Medical History:  Diagnosis Date  . Anxiety   . Depression   . Elevated hemoglobin ([place])   . Low testosterone   . Small bowel obstruction ([place]) 10/21/2016    Past Surgical History:  Procedure Laterality Date  . NASAL SINUS SURGERY  02/2016    Family Psychiatric History: See intake H&P for full details. Reviewed, with no updates at this time.   Family History:  Family History  Problem Relation Age of Onset  . Cancer Mother        kidney  . Hypertension Father   . Diabetes Father   . Diabetes Brother   . Cancer Paternal Grandmother     Social History:  Social History   Socioeconomic History  . Marital status: Married    Spouse name: Not on file  . Number of  children: Not on file  . Years of education: Not on file  . Highest education level: Not on file  Occupational History  . Not on file  Social Needs  . Financial resource strain: Not on file  . Food insecurity:    Worry: Not on file    Inability: Not on file  . Transportation needs:    Medical: Not on file    Non-medical: Not on file  Tobacco Use  . Smoking status: Never Smoker  . Smokeless tobacco: Never Used  Substance and Sexual Activity  . Alcohol use: Yes    Comment: 10/22/2016 "nothing in over 1 year"  . Drug use: No  . Sexual activity: Yes    Partners: Female    Birth control/protection: None  Lifestyle  . Physical activity:    Days per week: Not on file    Minutes per session: Not on file  . Stress: Not on file  Relationships  . Social connections:    Talks on phone: Not on file    Gets together: Not on file    Attends religious service: Not on file    Active member of club or organization: Not on file    Attends meetings of clubs or organizations: Not on file    Relationship status: Not on file  Other Topics Concern  . Not on file  Social History Narrative  . Not on file    Allergies:  Allergies  Allergen Reactions  . Amoxicillin  Other (See Comments)    High fever and possible heart racing  . Bactrim [Sulfamethoxazole-Trimethoprim] Other (See Comments)    Fever, body aches, chills  . Penicillins Other (See Comments)    High fever Has patient had a PCN reaction causing immediate rash, facial/tongue/throat swelling, SOB or lightheadedness with hypotension: No Has patient had a PCN reaction causing severe rash involving mucus membranes or skin necrosis: No Has patient had a PCN reaction that required hospitalization No Has patient had a PCN reaction occurring within the last 10 years: No If all of the above answers are "NO", then may proceed with Cephalosporin use.     Metabolic Disorder Labs: Lab Results  Component Value Date   HGBA1C 4.9% 08/13/2013    Lab Results  Component Value Date   PROLACTIN 12.4 10/09/2017   PROLACTIN 14.5 09/08/2015   Lab Results  Component Value Date   CHOL 202 (H) 10/18/2017   TRIG 323 (H) 10/18/2017   HDL 24 (L) 10/18/2017   CHOLHDL 8.4 (H) 10/18/2017   LDLCALC 113 (H) 10/18/2017   LDLCALC 118 (H) 06/15/2016   Lab Results  Component Value Date   TSH 3.130 10/09/2017   TSH 2.680 11/08/2016    Therapeutic Level Labs: No results found for: LITHIUM No results found for: VALPROATE No components found for:  CBMZ  Current Medications: Current Outpatient Medications  Medication Sig Dispense Refill  . citalopram (CELEXA) 40 MG tablet Take 1 tablet (40 mg total) by mouth every morning. 90 tablet 1  . clomiPRAMINE (ANAFRANIL) 25 MG capsule Take 1 capsule (25 mg total) by mouth 3 (three) times daily. 270 capsule 1  . gabapentin (NEURONTIN) 300 MG capsule Take 2 capsules (600 mg total) by mouth 3 (three) times daily. 180 capsule 6  . propranolol ER (INDERAL LA) 60 MG 24 hr capsule Take 1 capsule (60 mg total) by mouth daily. 90 capsule 1  . sildenafil (REVATIO) 20 MG tablet TAKE TWO TO FIVE TABLETS BY MOUTH ONCE DAILY AS NEEDED 30 tablet 5  . Syringe/Needle, Disp, (SYRINGE 3CC/20GX1") 20G X 1" 3 ML MISC Use one to inject Testosterone every 14 days. 50 each 0  . testosterone cypionate (DEPOTESTOSTERONE CYPIONATE) 200 MG/ML injection Inject 0.5 mLs (100 mg total) into the muscle every 14 (fourteen) days. 10 mL 0  . traZODone (DESYREL) 100 MG tablet Take 1 tablet (100 mg total) by mouth at bedtime. 90 tablet 1  . buPROPion (WELLBUTRIN) 75 MG tablet Take 1 tablet (75 mg total) by mouth 2 (two) times daily. 180 tablet 1   No current facility-administered medications for this visit.      Musculoskeletal: Strength & Muscle Tone: within normal limits Gait & Station: normal Patient leans: N/A  Psychiatric Specialty Exam: ROS  Blood pressure (!) 142/90, pulse 79, height 6\' 2"  (1.88 m), weight 283 lb (128.4  kg).Body mass index is 36.34 kg/m.  General Appearance: Casual and Fairly Groomed  Eye Contact:  Good  Speech:  Clear and Coherent  Volume:  Normal  Mood:  fatigued, tired  Affect:  Congruent  Thought Process:  Goal Directed and Descriptions of Associations: Intact  Orientation:  Full (Time, Place, and Person)  Thought Content: Logical   Suicidal Thoughts:  No  Homicidal Thoughts:  No  Memory:  Immediate;   Good  Judgement:  Fair  Insight:  Fair  Psychomotor Activity:  Normal  Concentration:  Concentration: Good  Recall:  Good  Fund of Knowledge: Good  Language: Good  Akathisia:  Negative  Handed:  Right  AIMS (if indicated): not done  Assets:  Communication Skills Desire for Improvement Financial Resources/Insurance Housing Intimacy Leisure Time [place] Talents/Skills Transportation Vocational/Educational  ADL's:  Intact  Cognition: WNL  Sleep:  Good   Screenings: GAD-7     Office Visit from 11/08/2016 in [facility] Visit from 11/30/2015 in [facility] Visit from 09/28/2015 in [facility]  Total GAD-7 Score  16  13  11     PHQ2-9     Office Visit from 10/22/2017 in [facility] Visit from 10/15/2017 in [facility] Office Visit from 11/08/2016 in [facility] Office Visit from 11/30/2015 in [facility] Visit from 10/06/2015 in [facility]  PHQ-2 Total Score  0  0  4  0  0  PHQ-9 Total Score  -  -  9  -  -       Assessment and Plan:  Elion Hocker appears less sleepy and tired with cpap on board and mood/anxiety has improved with current regimen. No panic attacks. Ongoing low energy related to motivation/desire.  Discussed starting wellbutrin IR and anticipate taper/discontinue clomipramine if possible in coming months.  No history of  seizures. Denies any SI, HI, AVH.  1. Chronic post-traumatic stress disorder (PTSD)   2. GAD (generalized anxiety disorder)   3. Moderate episode of recurrent major depressive disorder ([place])   4. Chronic fatigue   5. Social anxiety disorder   6. Persistent depressive disorder     Status of current problems: gradually improving  Labs Ordered: No orders of the defined types were placed in this encounter.   Labs Reviewed:  Lab Results  Component Value Date   TESTOSTERONE 89 (L) 12/30/2017    Collateral Obtained/Records Reviewed: n/a  Plan:  Trazodone 100 mg nightly prn Gabapentin 600 mg TID anxiety celexa 40 mg daily for mood/depression Clomipramine 25 mg TID for anxiety Start wellbutrin 75 mg BID for augmenting of energy/motivation  I spent 20 minutes with the patient in direct face-to-face clinical care.  Greater than 50% of this time was spent in counseling and coordination of care with the patient.    Aundra Dubin, MD 01/12/2018, 10:52 AM

## 2018-01-15 ENCOUNTER — Ambulatory Visit: Payer: 59 | Admitting: "Endocrinology

## 2018-01-15 ENCOUNTER — Encounter: Payer: Self-pay | Admitting: "Endocrinology

## 2018-01-15 VITALS — BP 124/83 | HR 78 | Ht 75.0 in | Wt 284.0 lb

## 2018-01-15 DIAGNOSIS — E291 Testicular hypofunction: Secondary | ICD-10-CM | POA: Diagnosis not present

## 2018-01-15 MED ORDER — TESTOSTERONE CYPIONATE 200 MG/ML IM SOLN: 100.0000 mg | INTRAMUSCULAR | 0 refills | Status: DC

## 2018-01-15 NOTE — Progress Notes (Signed)
HPI: Paul Parrish is a 47 y.o.-year-old man.  - He was kept on 100 mg of testosterone IM every other week during his last visit.  He has prior history of treatment withdrawal.  He says he is more consistent lately for his testosterone injection.- He was diagnosed with hypogonadism in 2014, etiology not clear. - He denies any significant history of testicular injury, infection, radiation, systemic chemotherapy, use of androgen's/steroids, and opioids. -He reports history of seizures as a young man until his teenage years. -He denies any history of head injury.  -Over the years, he was observed to have secondary polycythemia for which he is undergoing periodic phlebotomy. -His recent labs showed proved hematocrit to normal.   - He also is recently diagnosed with sleep apnea currently on CPAP.   - He fathers 2 teenage boys biologically. -In September 2016 he did have mildly elevated prolactin, which has corrected since. - He denies low libido, however reports chronic fatigue. Has difficulty obtaining or maintaining an erection, currently on sildenafil 20 mg when necessary.  No report of changing headaches. No FH of hypogonadism/infertility . - He is progressively gained weight. No chronic diseases. - He has significant mood disorder on multiple antidepressants and antianxiety. No herbal medicines. -He denies family history of premature coronary artery disease.   ROS: Constitutional: No recent negative current weight change, + fatigue, no subjective hyperthermia/hypothermia.  Eyes: no blurry vision, no xerophthalmia ENT: no sore throat, no nodules palpated in throat, no dysphagia/odynophagia, no hoarseness Cardiovascular: no chest pain, no palpitations, no shortness of breath.  Respiratory:  + Snoring, no cough/SOB Gastrointestinal: no N/V/D/C Musculoskeletal: no muscle/joint aches Skin: no rashes Neurological: no tremors/numbness/tingling/dizziness Psychiatric: + depression,  +anxiety  PE: BP 124/83   Pulse 78   Ht _0  (1.905 m)   Wt 284 lb (128.8 kg)   BMI 35.50 kg/m  Wt Readings from Last 3 Encounters:  01/15/18 284 lb (128.8 kg)  10/22/17 288 lb (130.6 kg)  10/15/17 281 lb (127.5 kg)   Constitutional:  Obese, alert and oriented x3, not in acute distress. Eyes: PERRLA, EOMI, no exophthalmos ENT: moist mucous membranes, no thyromegaly, no cervical lymphadenopathy  Skin: moist, warm, diffuse psoriatic  Rashes on bilateral upper extremities . Neurological: no tremor with outstretched hands, DTR normal in all 4 Genital exam: normal male escutcheon, no inguinal LAD, normal phallus, testes ~25 mL bilaterally, no testicular masses, circumcised male, no penile discharge.  No gynecomastia.     Recent Results (from the past 2160 hour(s))  Bayer DCA Hb A1c Waived     Status: None   Collection Time: 10/18/17  9:19 AM  Result Value Ref Range   Bayer DCA Hb A1c Waived 5.7 <7.0 %    Comment:                                       Diabetic Adult            <7.0                                       Healthy Adult        4.3 - 5.7                                                           (  DCCT/NGSP) American Diabetes Association's Summary of Glycemic Recommendations for Adults with Diabetes: Hemoglobin A1c <7.0%. More stringent glycemic goals (A1c <6.0%) may further reduce complications at the cost of increased risk of hypoglycemia.   Lipid panel     Status: Abnormal   Collection Time: 10/18/17  9:41 AM  Result Value Ref Range   Cholesterol, Total 202 (H) 100 - 199 mg/dL   Triglycerides 323 (H) 0 - 149 mg/dL   HDL 24 (L) >39 mg/dL   VLDL Cholesterol Cal 65 (H) 5 - 40 mg/dL   LDL Calculated 113 (H) 0 - 99 mg/dL   Chol/HDL Ratio 8.4 (H) 0.0 - 5.0 ratio    Comment:                                   T. Chol/HDL Ratio                                             Men  Women                               1/2 Avg.Risk  3.4    3.3                                    Avg.Risk  5.0    4.4                                2X Avg.Risk  9.6    7.1                                3X Avg.Risk 23.4   11.0   CBC with Differential/Platelet     Status: Abnormal   Collection Time: 10/18/17  9:41 AM  Result Value Ref Range   WBC 6.4 3.4 - 10.8 x10E3/uL   RBC 5.78 4.14 - 5.80 x10E6/uL   Hemoglobin 17.8 (H) 13.0 - 17.7 g/dL   Hematocrit 52.4 (H) 37.5 - 51.0 %   MCV 91 79 - 97 fL   MCH 30.8 26.6 - 33.0 pg   MCHC 34.0 31.5 - 35.7 g/dL   RDW 15.5 (H) 12.3 - 15.4 %   Platelets 228 150 - 379 x10E3/uL   Neutrophils 47 Not Estab. %   Lymphs 41 Not Estab. %   Monocytes 10 Not Estab. %   Eos 2 Not Estab. %   Basos 0 Not Estab. %   Neutrophils Absolute 3.0 1.4 - 7.0 x10E3/uL   Lymphocytes Absolute 2.6 0.7 - 3.1 x10E3/uL   Monocytes Absolute 0.6 0.1 - 0.9 x10E3/uL   EOS (ABSOLUTE) 0.1 0.0 - 0.4 x10E3/uL   Basophils Absolute 0.0 0.0 - 0.2 x10E3/uL   Immature Granulocytes 0 Not Estab. %   Immature Grans (Abs) 0.0 0.0 - 0.1 x10E3/uL  CMP14+EGFR     Status: Abnormal   Collection Time: 10/18/17  9:41 AM  Result Value Ref Range   Glucose 97 65 - 99 mg/dL   BUN 10 6 - 24 mg/dL   Creatinine, Ser 1.12 0.76 - 1.27 mg/dL  GFR calc non Af Amer 78 >59 mL/min/1.73   GFR calc Af Amer 91 >59 mL/min/1.73   BUN/Creatinine Ratio 9 9 - 20   Sodium 139 134 - 144 mmol/L   Potassium 4.6 3.5 - 5.2 mmol/L   Chloride 101 96 - 106 mmol/L   CO2 24 20 - 29 mmol/L   Calcium 9.3 8.7 - 10.2 mg/dL   Total Protein 7.5 6.0 - 8.5 g/dL   Albumin 4.5 3.5 - 5.5 g/dL   Globulin, Total 3.0 1.5 - 4.5 g/dL   Albumin/Globulin Ratio 1.5 1.2 - 2.2   Bilirubin Total 0.5 0.0 - 1.2 mg/dL   Alkaline Phosphatase 90 39 - 117 IU/L   AST 37 0 - 40 IU/L   ALT 81 (H) 0 - 44 IU/L  Testosterone     Status: Abnormal   Collection Time: 12/30/17 11:47 AM  Result Value Ref Range   Testosterone 89 (L) 264 - 916 ng/dL    Comment: Adult male reference interval is based on a population of healthy nonobese males  (BMI <30) between 10 and 108 years old. [place], [place] [phone]. PMID: [id number].   PSA     Status: None   Collection Time: 12/30/17 11:47 AM  Result Value Ref Range   Prostate Specific Ag, Serum 0.5 0.0 - 4.0 ng/mL    Comment: Roche ECLIA methodology. According to the American Urological Association, Serum PSA should decrease and remain at undetectable levels after radical prostatectomy. The AUA defines biochemical recurrence as an initial PSA value 0.2 ng/mL or greater followed by a subsequent confirmatory PSA value 0.2 ng/mL or greater. Values obtained with different assay methods or kits cannot be used interchangeably. Results cannot be interpreted as absolute evidence of the presence or absence of malignant disease.   CBC with Differential     Status: None   Collection Time: 12/30/17 11:47 AM  Result Value Ref Range   WBC 5.0 3.4 - 10.8 x10E3/uL   RBC 5.47 4.14 - 5.80 x10E6/uL   Hemoglobin 17.2 13.0 - 17.7 g/dL   Hematocrit 49.3 37.5 - 51.0 %   MCV 90 79 - 97 fL   MCH 31.4 26.6 - 33.0 pg   MCHC 34.9 31.5 - 35.7 g/dL   RDW 14.8 12.3 - 15.4 %   Platelets 228 150 - 379 x10E3/uL   Neutrophils 49 Not Estab. %   Lymphs 38 Not Estab. %   Monocytes 10 Not Estab. %   Eos 2 Not Estab. %   Basos 1 Not Estab. %   Neutrophils Absolute 2.5 1.4 - 7.0 x10E3/uL   Lymphocytes Absolute 1.9 0.7 - 3.1 x10E3/uL   Monocytes Absolute 0.5 0.1 - 0.9 x10E3/uL   EOS (ABSOLUTE) 0.1 0.0 - 0.4 x10E3/uL   Basophils Absolute 0.1 0.0 - 0.2 x10E3/uL   Immature Granulocytes 0 Not Estab. %   Immature Grans (Abs) 0.0 0.0 - 0.1 x10E3/uL  Hepatic Function Panel     Status: None   Collection Time: 12/30/17 11:47 AM  Result Value Ref Range   Total Protein 6.9 6.0 - 8.5 g/dL   Albumin 4.2 3.5 - 5.5 g/dL   Bilirubin Total 0.5 0.0 - 1.2 mg/dL   Bilirubin, Direct 0.17 0.00 - 0.40 mg/dL   Alkaline Phosphatase 72 39 - 117 IU/L   AST 26 0 - 40 IU/L   ALT 36 0 - 44 IU/L      ASSESSMENT: 1.   Hypogonadism  2. Erectile Dysfunction  PLAN:  1. Hypogonadism  -Despite  his consistent injection of testosterone 100 mg IM every other week his previsit labs show low total testosterone of 89.  This is lower than his last visit level of 168. He wishes to be treated with testosterone. - I have discussed diagnosis of hypogonadism, safe use of testosterone,   and relevant workup of hypogonadism with him. -  I had a long discussion with the patient regarding his testosterone results, which we reviewed together.  I  discussed adverse effects of unnecessary testosterone replacement short-term and long-term. -He did have secondarypolycythemia higher to his last visit associated with  sleep apnea-overtreatment should be avoided. - His main complaint of fatigue could be the result of sleep apnea which indirectly could be made worse by supraphysiologic testosterone, and would not improve with higher dose of testosterone. - This will improve with measures targeted to improving his sleep, diet and exercise .   - Since he wishes to be continued on testosterone replacement, I will restart with low-dose testosterone: 100 mg intramuscularly every 7 days,  with a plan to  repeat   Testosterone, in 3 months.  His CBC, PSA, and liver function tests are within normal limits.   - His prolactin level is back to normal, as well as his normal thyroid function test.    Glade Lloyd, MD Phone: [phone]  Fax: [phone]  -  This note was partially dictated with voice recognition software. Similar sounding words can be transcribed inadequately or may not  be corrected upon review.  01/15/2018, 7:12 PM

## 2018-01-30 ENCOUNTER — Encounter (HOSPITAL_COMMUNITY): Payer: Self-pay | Admitting: Psychiatry

## 2018-02-02 ENCOUNTER — Encounter (HOSPITAL_COMMUNITY): Payer: Self-pay | Admitting: Psychiatry

## 2018-02-03 ENCOUNTER — Other Ambulatory Visit (HOSPITAL_COMMUNITY): Payer: Self-pay

## 2018-02-03 DIAGNOSIS — F411 Generalized anxiety disorder: Secondary | ICD-10-CM

## 2018-02-03 DIAGNOSIS — F4010 Social phobia, unspecified: Secondary | ICD-10-CM

## 2018-02-03 MED ORDER — TRAZODONE HCL 100 MG PO TABS: 200.0000 mg | ORAL_TABLET | Freq: Every day | ORAL | 0 refills | Status: DC

## 2018-02-06 DIAGNOSIS — G4733 Obstructive sleep apnea (adult) (pediatric): Secondary | ICD-10-CM | POA: Diagnosis not present

## 2018-02-09 ENCOUNTER — Encounter (HOSPITAL_COMMUNITY): Payer: Self-pay | Admitting: Psychiatry

## 2018-02-09 ENCOUNTER — Encounter: Payer: Self-pay | Admitting: Family Medicine

## 2018-02-11 DIAGNOSIS — L409 Psoriasis, unspecified: Secondary | ICD-10-CM | POA: Diagnosis not present

## 2018-02-11 DIAGNOSIS — D485 Neoplasm of uncertain behavior of skin: Secondary | ICD-10-CM | POA: Diagnosis not present

## 2018-03-06 ENCOUNTER — Encounter (HOSPITAL_COMMUNITY): Payer: Self-pay | Admitting: Psychiatry

## 2018-03-09 DIAGNOSIS — G4733 Obstructive sleep apnea (adult) (pediatric): Secondary | ICD-10-CM | POA: Diagnosis not present

## 2018-03-12 ENCOUNTER — Encounter: Payer: Self-pay | Admitting: Pediatrics

## 2018-03-12 ENCOUNTER — Ambulatory Visit: Payer: 59 | Admitting: Pediatrics

## 2018-03-12 VITALS — BP 128/82 | HR 86 | Temp 97.5°F | Resp 22 | Ht 75.0 in | Wt 280.2 lb

## 2018-03-12 DIAGNOSIS — J069 Acute upper respiratory infection, unspecified: Secondary | ICD-10-CM | POA: Diagnosis not present

## 2018-03-12 DIAGNOSIS — R05 Cough: Secondary | ICD-10-CM

## 2018-03-12 DIAGNOSIS — R059 Cough, unspecified: Secondary | ICD-10-CM

## 2018-03-12 MED ORDER — HYDROCODONE-HOMATROPINE 5-1.5 MG/5ML PO SYRP: 5.0000 mL | ORAL_SOLUTION | Freq: Four times a day (QID) | ORAL | 0 refills | Status: DC | PRN

## 2018-03-12 NOTE — Patient Instructions (Signed)
Fever reducer and headache: tylenol and ibuprofen, can take together or alternating   Sinus pressure:  Nasal steroid such as flonase/fluticaone or nasocort daily Can also take daily antihistamine such as loratadine/claritin or cetirizine/zyrtec Decongestant such as phenylephrine, but take in the morning or may keep you awake at night  Sinus rinses/irritation: Netipot or similar with distilled water 2-3 times a day to clear out sinuses or Normal saline nasal spray  Sore throat:  Throat lozenges chloroseptic spray  Stick with bland foods Drink lots of fluids  

## 2018-03-12 NOTE — Progress Notes (Signed)
  Subjective:   Patient ID: Paul Parrish, male    DOB: [birth year], 47 y.o.   MRN: [id number] CC: Cough; Chest congestion; and Nasal Congestion  HPI: Paul Parrish is a 47 y.o. male   Started having URI symptoms 2 days ago.  Started with sore throat, which is now improved.  Coughing bothering the most now.  Cough is dry, feels like a burning in his chest when he coughs.  Ongoing nasal congestion.  Appetite is been fine.  No fevers.  He has been taking Xyzal at home.  History of sinus surgery, he used Nettie pot sinus rinses at that time, has not been doing sinus rinses since then.  Relevant past medical, surgical, family and social history reviewed. Allergies and medications reviewed and updated. Social History   Tobacco Use  Smoking Status Never Smoker  Smokeless Tobacco Never Used   ROS: Per HPI   Objective:    BP 128/82   Pulse 86   Temp (!) 97.5 F (36.4 C) (Oral)   Resp (!) 22   Ht 6\' 3"  (1.905 m)   Wt 280 lb 3.2 oz (127.1 kg)   SpO2 98%   BMI 35.02 kg/m   Wt Readings from Last 3 Encounters:  03/12/18 280 lb 3.2 oz (127.1 kg)  01/15/18 284 lb (128.8 kg)  10/22/17 288 lb (130.6 kg)    Gen: NAD, alert, cooperative with exam, NCAT EYES: EOMI, no conjunctival injection, or no icterus ENT:  TMs dull gray b/l, OP with mild erythema LYMPH: no cervical LAD CV: NRRR, normal S1/S2, no murmur, distal pulses 2+ b/l Resp: CTABL, no wheezes, normal WOB Abd: +BS, soft, NTND. no guarding or organomegaly Ext: No edema, warm Neuro: Alert and oriented, strength equal b/l UE and LE, coordination grossly normal MSK: normal muscle bulk  Assessment & Plan:  Paul Parrish was seen today for cough, chest congestion and nasal congestion.  Diagnoses and all orders for this visit:  Acute URI Discussed symptomatic care and return precautions.  Cough bothering him the most, keeping him awake at night.  Has used below prescription cough medicine in the past with relief from symptoms.  Will do trial  below.  May need to switch to codeine-containing medicine if too expensive with his insurance.  Do not take and drive, will cause drowsiness.  Cough -     HYDROcodone-homatropine (HYCODAN) 5-1.5 MG/5ML syrup; Take 5 mLs by mouth every 6 (six) hours as needed for cough.   Follow up plan: Return if symptoms worsen or fail to improve. Assunta Found, MD [place]

## 2018-03-18 ENCOUNTER — Ambulatory Visit (HOSPITAL_COMMUNITY): Payer: 59 | Admitting: Psychiatry

## 2018-04-08 DIAGNOSIS — G4733 Obstructive sleep apnea (adult) (pediatric): Secondary | ICD-10-CM | POA: Diagnosis not present

## 2018-04-10 ENCOUNTER — Encounter: Payer: Self-pay | Admitting: "Endocrinology

## 2018-04-13 ENCOUNTER — Other Ambulatory Visit: Payer: Self-pay | Admitting: "Endocrinology

## 2018-04-13 DIAGNOSIS — Z6833 Body mass index (BMI) 33.0-33.9, adult: Principal | ICD-10-CM

## 2018-04-13 DIAGNOSIS — E291 Testicular hypofunction: Secondary | ICD-10-CM

## 2018-04-13 DIAGNOSIS — E6609 Other obesity due to excess calories: Secondary | ICD-10-CM

## 2018-04-13 DIAGNOSIS — D751 Secondary polycythemia: Secondary | ICD-10-CM

## 2018-04-27 ENCOUNTER — Encounter: Payer: Self-pay | Admitting: Family Medicine

## 2018-04-29 ENCOUNTER — Ambulatory Visit: Payer: 59 | Admitting: "Endocrinology

## 2018-05-06 ENCOUNTER — Ambulatory Visit: Payer: 59 | Admitting: Family Medicine

## 2018-05-07 ENCOUNTER — Other Ambulatory Visit: Payer: Self-pay

## 2018-05-07 MED ORDER — TESTOSTERONE CYPIONATE 100 MG/ML IM SOLN: 50.0000 mg | INTRAMUSCULAR | 3 refills | Status: DC

## 2018-05-20 ENCOUNTER — Ambulatory Visit: Payer: 59 | Admitting: Family Medicine

## 2018-05-30 ENCOUNTER — Encounter (HOSPITAL_COMMUNITY): Payer: Self-pay | Admitting: Psychiatry

## 2018-05-30 ENCOUNTER — Ambulatory Visit (INDEPENDENT_AMBULATORY_CARE_PROVIDER_SITE_OTHER): Payer: 59 | Admitting: Psychiatry

## 2018-05-30 ENCOUNTER — Ambulatory Visit (HOSPITAL_COMMUNITY): Payer: Self-pay | Admitting: Psychiatry

## 2018-05-30 VITALS — BP 132/74 | HR 80 | Ht 74.0 in | Wt 292.0 lb

## 2018-05-30 DIAGNOSIS — F410 Panic disorder [episodic paroxysmal anxiety] without agoraphobia: Secondary | ICD-10-CM

## 2018-05-30 DIAGNOSIS — F331 Major depressive disorder, recurrent, moderate: Secondary | ICD-10-CM | POA: Diagnosis not present

## 2018-05-30 DIAGNOSIS — F411 Generalized anxiety disorder: Secondary | ICD-10-CM | POA: Diagnosis not present

## 2018-05-30 DIAGNOSIS — F4312 Post-traumatic stress disorder, chronic: Secondary | ICD-10-CM | POA: Diagnosis not present

## 2018-05-30 DIAGNOSIS — R5382 Chronic fatigue, unspecified: Secondary | ICD-10-CM

## 2018-05-30 DIAGNOSIS — F4010 Social phobia, unspecified: Secondary | ICD-10-CM | POA: Diagnosis not present

## 2018-05-30 MED ORDER — TRAZODONE HCL 100 MG PO TABS: 200.0000 mg | ORAL_TABLET | Freq: Every day | ORAL | 1 refills | Status: DC

## 2018-05-30 MED ORDER — VIIBRYD 40 MG PO TABS: 40.0000 mg | ORAL_TABLET | Freq: Every day | ORAL | 2 refills | Status: DC

## 2018-05-30 MED ORDER — VYVANSE 70 MG PO CAPS: ORAL_CAPSULE | ORAL | 0 refills | Status: DC

## 2018-05-30 NOTE — Progress Notes (Signed)
[facility] MD/PA/NP OP Progress Note  05/30/2018 8:32 AM Paul Parrish  MRN:  [id number]  Chief Complaint:  Chief Complaint    Follow-up; Medication Refill       HPI: Del Overfelt reports anxiety/mood with good control. Ongoing issue is fatigue, low energy. Uses nasal pillow CPAP nightly with good benefits. Complicating factors include ongoing low testosterone approximately 80-90.  pcp is getting MRI brain for further investigation.    Is following with providers for low testosterone and on injections  [facility] with [facility]. Exir  adderall was changed to vyvanse . He is tolerating it for fatigue and inattention Apparently got xanax tid but not using regularly. 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 He works in IT Depression is fair and content with med  reviewed possibility of cutting down some meds   Denies seizures or head injuuries  Visit Diagnosis:    ICD-10-CM   1. Social anxiety disorder F40.10 traZODone (DESYREL) 100 MG tablet  2. Chronic post-traumatic stress disorder (PTSD) F43.12   3. Moderate episode of recurrent major depressive disorder (HCC) F33.1   4. GAD (generalized anxiety disorder) F41.1 traZODone (DESYREL) 100 MG tablet  5. Panic attacks F41.0   6. Chronic fatigue R53.82     Past Psychiatric History: See intake H&P for full details. Reviewed, with no updates at this time.   Past Medical History:  Past Medical History:  Diagnosis Date  . Anxiety   . Depression   . Elevated hemoglobin ([place])   . Low testosterone   . Small bowel obstruction ([place]) 10/21/2016    Past Surgical History:  Procedure Laterality Date  . NASAL SINUS SURGERY  02/2016    Family Psychiatric History: See intake H&P for full details. Reviewed, with no updates at this time.   Family History:   Family History  Problem Relation Age of Onset  . Cancer Mother        kidney  . Hypertension Father   . Diabetes Father   . Diabetes Brother   . Cancer Paternal Grandmother     Social History:  Social History   Socioeconomic History  . Marital status: Married    Spouse name: Not on file  . Number of children: Not on file  . Years of education: Not on file  . Highest education level: Not on file  Occupational History  . Not on file  Social Needs  . Financial resource strain: Not on file  . Food insecurity:    Worry: Not on file    Inability: Not on file  . Transportation needs:    Medical: Not on file    Non-medical: Not on file  Tobacco Use  . Smoking status: Never Smoker  . Smokeless tobacco: Never Used  Substance and Sexual Activity  . Alcohol use: Yes    Comment: 10/22/2016 "nothing in over 1 year"  . Drug use: No  . Sexual activity: Yes    Partners: Female    Birth control/protection: None  Lifestyle  . Physical activity:    Days per week: Not on file    Minutes per session: Not on file  . Stress: Not on file  Relationships  . Social connections:    Talks on phone: Not on file    Gets together: Not on file    Attends religious  service: Not on file    Active member of club or organization: Not on file    Attends meetings of clubs or organizations: Not on file    Relationship status: Not on file  Other Topics Concern  . Not on file  Social History Narrative  . Not on file    Allergies:  Allergies  Allergen Reactions  . Amoxicillin Other (See Comments)    High fever and possible heart racing  . Bactrim [Sulfamethoxazole-Trimethoprim] Other (See Comments)    Fever, body aches, chills  . Penicillins Other (See Comments)    High fever Has patient had a PCN reaction causing immediate rash, facial/tongue/throat swelling, SOB or lightheadedness with hypotension: No Has patient had a PCN reaction causing severe rash involving mucus membranes or skin  necrosis: No Has patient had a PCN reaction that required hospitalization No Has patient had a PCN reaction occurring within the last 10 years: No If all of the above answers are "NO", then may proceed with Cephalosporin use.     Metabolic Disorder Labs: Lab Results  Component Value Date   HGBA1C 4.9% 08/13/2013   Lab Results  Component Value Date   PROLACTIN 12.4 10/09/2017   PROLACTIN 14.5 09/08/2015   Lab Results  Component Value Date   CHOL 202 (H) 10/18/2017   TRIG 323 (H) 10/18/2017   HDL 24 (L) 10/18/2017   CHOLHDL 8.4 (H) 10/18/2017   LDLCALC 113 (H) 10/18/2017   LDLCALC 118 (H) 06/15/2016   Lab Results  Component Value Date   TSH 3.130 10/09/2017   TSH 2.680 11/08/2016    Therapeutic Level Labs: No results found for: LITHIUM No results found for: VALPROATE No components found for:  CBMZ  Current Medications: Current Outpatient Medications  Medication Sig Dispense Refill  . buPROPion (WELLBUTRIN) 75 MG tablet Take 1 tablet (75 mg total) by mouth 2 (two) times daily. 180 tablet 1  . gabapentin (NEURONTIN) 300 MG capsule Take 2 capsules (600 mg total) by mouth 3 (three) times daily. 180 capsule 6  . propranolol ER (INDERAL LA) 60 MG 24 hr capsule Take 1 capsule (60 mg total) by mouth daily. 90 capsule 1  . sildenafil (REVATIO) 20 MG tablet TAKE TWO TO FIVE TABLETS BY MOUTH ONCE DAILY AS NEEDED 30 tablet 5  . Syringe/Needle, Disp, (SYRINGE 3CC/20GX1") 20G X 1" 3 ML MISC Use one to inject Testosterone every 14 days. 50 each 0  . testosterone cypionate (DEPOTESTOTERONE CYPIONATE) 100 MG/ML injection Inject 0.5 mLs (50 mg total) into the muscle every 7 (seven) days. For IM use only 2 mL 3  . traZODone (DESYREL) 100 MG tablet Take 2 tablets (200 mg total) by mouth at bedtime. 60 tablet 1  . ALPRAZolam (XANAX) 1 MG tablet TAKE 1 TABLET BY MOUTH UP TO THREE TIMES DAILY FOR ANXIETY  0  . halobetasol (ULTRAVATE) 0.05 % cream     . VIIBRYD 40 MG TABS Take 1 tablet (40 mg  total) by mouth daily. 30 tablet 2  . VYVANSE 70 MG capsule TAKE 1 CAPSULE BY MOUTH IN THE MORNING FOR ADD AND BED 30 capsule 0   No current facility-administered medications for this visit.      Musculoskeletal: Strength & Muscle Tone: within normal limits Gait & Station: normal Patient leans: N/A  Psychiatric Specialty Exam: Review of Systems  Cardiovascular: Negative for chest pain.  Skin: Negative for rash.  Neurological: Negative for tremors.    Blood pressure 132/74, pulse 80, height 6\' 2"  (1.88  m), weight 292 lb (132.5 kg).Body mass index is 37.49 kg/m.  General Appearance: Casual and Fairly Groomed  Eye Contact:  Good  Speech:  Clear and Coherent  Volume:  Normal  Mood: fair  Affect:  Congruent  Thought Process:  Goal Directed and Descriptions of Associations: Intact  Orientation:  Full (Time, Place, and Person)  Thought Content: Logical   Suicidal Thoughts:  No  Homicidal Thoughts:  No  Memory:  Immediate;   Good  Judgement:  Fair  Insight:  Fair  Psychomotor Activity:  Normal  Concentration:  Concentration: Good  Recall:  Good  Fund of Knowledge: Good  Language: Good  Akathisia:  Negative  Handed:  Right  AIMS (if indicated): not done  Assets:  Communication Skills Desire for Improvement Financial Resources/Insurance Housing Intimacy Leisure Time [place] Talents/Skills Transportation Vocational/Educational  ADL's:  Intact  Cognition: WNL  Sleep:  Good   Screenings: GAD-7     Office Visit from 11/08/2016 in [facility] Visit from 11/30/2015 in [facility] Visit from 09/28/2015 in [facility]  Total GAD-7 Score  16  13  11     PHQ2-9     Office Visit from 03/12/2018 in [facility] Visit from 01/15/2018 in [facility] Office Visit from 10/22/2017 in [facility] Visit from 10/15/2017 in [facility] Office Visit from 11/08/2016 in [facility] [facility]  PHQ-2 Total Score  0  0  0  0  4  PHQ-9 Total Score  -  -  -  -  9       Assessment and Plan:  Mykah Shin appears less sleepy and tired with cpap on board and mood/anxiety has improved with current regimen. No panic attacks. Ongoing low energy related to motivation/desire.  Discussed starting wellbutrin IR and anticipate taper/discontinue clomipramine if possible in coming months.  No history of seizures. Denies any SI, HI, AVH.  1. Social anxiety disorder   2. Chronic post-traumatic stress disorder (PTSD)   3. Moderate episode of recurrent major depressive disorder ([place])   4. GAD (generalized anxiety disorder)   5. Panic attacks   6. Chronic fatigue     Status of current problems: gradually improving  Labs Ordered: No orders of the defined types were placed in this encounter.   Labs Reviewed:  Lab Results  Component Value Date   TESTOSTERONE 89 (L) 12/30/2017    Collateral Obtained/Records Reviewed: n/a  MDD: continue wellbutrin and viibryd GAD: continue viibryd, gabapentin. Limit xanax to prn and no refills given. [facility] PTSD: denies flashbacks Fatigue: continue vyvanse   I spent 20 minutes with the patient in direct face-to-face clinical care.  Greater than 50% of this time was spent in counseling and coordination of care with the patient. Fu 2 months with [facility], call for other refills if they get due otherwise have meds except vyvanse, trazadone and viibryd which were filled today    Merian Capron, MD 05/30/2018, 8:32 AM

## 2018-06-02 ENCOUNTER — Ambulatory Visit: Payer: 59 | Admitting: "Endocrinology

## 2018-07-03 ENCOUNTER — Other Ambulatory Visit: Payer: Self-pay | Admitting: "Endocrinology

## 2018-07-30 ENCOUNTER — Ambulatory Visit (HOSPITAL_COMMUNITY): Payer: Self-pay | Admitting: Psychiatry

## 2018-08-11 DIAGNOSIS — L409 Psoriasis, unspecified: Secondary | ICD-10-CM | POA: Diagnosis not present

## 2018-08-18 DIAGNOSIS — R000 Tachycardia, unspecified: Secondary | ICD-10-CM | POA: Diagnosis not present

## 2018-08-18 DIAGNOSIS — R0602 Shortness of breath: Secondary | ICD-10-CM | POA: Diagnosis not present

## 2018-08-18 DIAGNOSIS — R202 Paresthesia of skin: Secondary | ICD-10-CM | POA: Diagnosis not present

## 2018-08-19 ENCOUNTER — Encounter: Payer: Self-pay | Admitting: Family Medicine

## 2018-08-19 ENCOUNTER — Ambulatory Visit: Payer: 59 | Admitting: Family Medicine

## 2018-08-19 VITALS — BP 146/91 | HR 98 | Temp 97.3°F | Ht 74.0 in | Wt 283.8 lb

## 2018-08-19 DIAGNOSIS — J0110 Acute frontal sinusitis, unspecified: Secondary | ICD-10-CM | POA: Diagnosis not present

## 2018-08-19 DIAGNOSIS — Z13220 Encounter for screening for lipoid disorders: Secondary | ICD-10-CM

## 2018-08-19 DIAGNOSIS — R0602 Shortness of breath: Secondary | ICD-10-CM

## 2018-08-19 MED ORDER — AZITHROMYCIN 250 MG PO TABS: ORAL_TABLET | ORAL | 0 refills | Status: DC

## 2018-08-19 MED ORDER — FLUTICASONE PROPIONATE 50 MCG/ACT NA SUSP: 1.0000 | Freq: Two times a day (BID) | NASAL | 6 refills | Status: DC | PRN

## 2018-08-19 NOTE — Progress Notes (Signed)
BP (!) 146/91   Pulse 98   Temp (!) 97.3 F (36.3 C) (Oral)   Ht _0  (1.88 m)   Wt 283 lb 12.8 oz (128.7 kg)   SpO2 97%   BMI 36.44 kg/m    Subjective:    Patient ID: Paul Parrish, male    DOB: 1970-12-09, 47 y.o.   MRN: [id number]  HPI: Paul Parrish is a 47 y.o. male presenting on 08/19/2018 for Cough (x 2 days. Patient states that he had a"tingling feeling in his body"  which gave him so SOB so he called 911 . Patient states they did EKG and it was not his heart); Nasal Congestion; Headache; and Dizziness   HPI Shortness of breath and sinus congestion Patient has been having a cough and sinus congestion for the past 2 to 3 days but just yesterday he developed an episode of shortness of breath and tightness after supper.  He does say that he did eat something he does not normally have and [place] food with soy sauce as he has never had soy sauce before.  He said about 20 to 30 minutes after he started having shortness of breath and tightness in his chest and called EMS and they came to evaluate him and did an EKG and by that time the shortness of breath and tightness in his chest was resolved.  He denies having any further symptoms like that.  He still does have a lot of sinus congestion and drainage and is coughing but he denies the shortness of breath like he was having during that 30-minute window.  Patient denies any fevers or chills.  He does have some chest wall pain with coughing especially.  He denies any sick contacts that he knows of.  He has been using Alka-Seltzer and a cold and flu medication and Mucinex which have been helping until this incident happened after supper.  He does complain of some upper abdominal discomfort that he has had for at least a week or 2 and he still has a little bit of it today as well.  He did have tingling and numbness during this episode 1 and occurred as well.  Relevant past medical, surgical, family and social history reviewed and updated as  indicated. Interim medical history since our last visit reviewed. Allergies and medications reviewed and updated.  Review of Systems  Constitutional: Negative for chills and fever.  HENT: Positive for congestion, postnasal drip, rhinorrhea, sinus pressure, sneezing and sore throat. Negative for ear discharge, ear pain and voice change.   Eyes: Negative for pain, discharge, redness and visual disturbance.  Respiratory: Positive for apnea, chest tightness and shortness of breath. Negative for wheezing.   Cardiovascular: Negative for chest pain and leg swelling.  Gastrointestinal: Positive for abdominal pain.  Musculoskeletal: Negative for back pain and gait problem.  Skin: Negative for rash.  Neurological: Positive for numbness and headaches. Negative for dizziness.  All other systems reviewed and are negative.   Per HPI unless specifically indicated above   Allergies as of 08/19/2018      Reactions   Amoxicillin Other (See Comments)   High fever and possible heart racing   Bactrim [sulfamethoxazole-trimethoprim] Other (See Comments)   Fever, body aches, chills   Penicillins Other (See Comments)   High fever Has patient had a PCN reaction causing immediate rash, facial/tongue/throat swelling, SOB or lightheadedness with hypotension: No Has patient had a PCN reaction causing severe rash involving mucus membranes or skin necrosis: No Has  patient had a PCN reaction that required hospitalization No Has patient had a PCN reaction occurring within the last 10 years: No If all of the above answers are "NO", then may proceed with Cephalosporin use.      Medication List        Accurate as of 08/19/18 11:19 AM. Always use your most recent med list.          azithromycin 250 MG tablet Commonly known as:  ZITHROMAX Take 2 the first day and then one each day after.   fluticasone 50 MCG/ACT nasal spray Commonly known as:  FLONASE Place 1 spray into both nostrils 2 (two) times daily as  needed for allergies or rhinitis.   gabapentin 300 MG capsule Commonly known as:  NEURONTIN Take 2 capsules (600 mg total) by mouth 3 (three) times daily.   sildenafil 20 MG tablet Commonly known as:  REVATIO TAKE TWO TO FIVE TABLETS BY MOUTH ONCE DAILY AS NEEDED   SYRINGE 3CC/20GX1" 20G X 1" 3 ML Misc Use one to inject Testosterone every 14 days.   testosterone cypionate 100 MG/ML injection Commonly known as:  DEPOTESTOTERONE CYPIONATE Inject 0.5 mLs (50 mg total) into the muscle every 7 (seven) days. For IM use only   traZODone 100 MG tablet Commonly known as:  DESYREL Take 2 tablets (200 mg total) by mouth at bedtime.          Objective:    BP (!) 146/91   Pulse 98   Temp (!) 97.3 F (36.3 C) (Oral)   Ht _0  (1.88 m)   Wt 283 lb 12.8 oz (128.7 kg)   SpO2 97%   BMI 36.44 kg/m   Wt Readings from Last 3 Encounters:  08/19/18 283 lb 12.8 oz (128.7 kg)  03/12/18 280 lb 3.2 oz (127.1 kg)  01/15/18 284 lb (128.8 kg)    Physical Exam  Constitutional: He is oriented to person, place, and time. He appears well-developed and well-nourished. No distress.  Eyes: Conjunctivae are normal. No scleral icterus.  Neck: Neck supple. No thyromegaly present.  Cardiovascular: Normal rate, regular rhythm, normal heart sounds and intact distal pulses.  No murmur heard. Pulmonary/Chest: Effort normal and breath sounds normal. No respiratory distress. He has no wheezes. He exhibits no tenderness.  Abdominal: Soft. Bowel sounds are normal. He exhibits no distension. There is tenderness (Mild epigastric discomfort on exam, negative Murphy sign).  Musculoskeletal: Normal range of motion. He exhibits no edema.  Lymphadenopathy:    He has no cervical adenopathy.  Neurological: He is alert and oriented to person, place, and time. Coordination normal.  Skin: Skin is warm and dry. No rash noted. He is not diaphoretic.  Psychiatric: He has a normal mood and affect. His behavior is normal.    Nursing note and vitals reviewed.     Assessment & Plan:   Problem List Items Addressed This Visit    None    Visit Diagnoses    Shortness of breath    -  Primary   1 spell of shortness of breath yesterday that lasted 30 minutes until EMS arrived and then improved, has not returned, could be correlated to GERD or gallbladde   Relevant Orders   CMP14+EGFR   Acute non-recurrent frontal sinusitis       Relevant Medications   azithromycin (ZITHROMAX) 250 MG tablet   fluticasone (FLONASE) 50 MCG/ACT nasal spray   Other Relevant Orders   CBC with Differential/Platelet   CMP14+EGFR   Lipid screening  Relevant Orders   Lipid panel       Follow up plan: Return if symptoms worsen or fail to improve.  Counseling provided for all of the vaccine components Orders Placed This Encounter  Procedures  . CBC with Differential/Platelet  . CMP14+EGFR  . Lipid panel    [name], MD [facility] 08/19/2018, 11:19 AM

## 2018-08-20 LAB — CMP14+EGFR
ALT: 23 IU/L (ref 0–44)
AST: 17 IU/L (ref 0–40)
Albumin/Globulin Ratio: 2 (ref 1.2–2.2)
Albumin: 4.5 g/dL (ref 3.5–5.5)
Alkaline Phosphatase: 73 IU/L (ref 39–117)
BUN/Creatinine Ratio: 5 — ABNORMAL LOW (ref 9–20)
BUN: 6 mg/dL (ref 6–24)
Bilirubin Total: 0.6 mg/dL (ref 0.0–1.2)
CO2: 23 mmol/L (ref 20–29)
Calcium: 9.4 mg/dL (ref 8.7–10.2)
Chloride: 100 mmol/L (ref 96–106)
Creatinine, Ser: 1.22 mg/dL (ref 0.76–1.27)
GFR calc Af Amer: 81 mL/min/1.73 (ref >59)
GFR calc non Af Amer: 70 mL/min/1.73 (ref >59)
Globulin, Total: 2.2 g/dL (ref 1.5–4.5)
Glucose: 89 mg/dL (ref 65–99)
Potassium: 4.2 mmol/L (ref 3.5–5.2)
Sodium: 142 mmol/L (ref 134–144)
Total Protein: 6.7 g/dL (ref 6.0–8.5)

## 2018-08-20 LAB — CBC WITH DIFFERENTIAL/PLATELET
BASOS ABS: 0.1 10*3/uL (ref 0.0–0.2)
Basos: 1 %
EOS (ABSOLUTE): 0.1 10*3/uL (ref 0.0–0.4)
Eos: 1 %
Hematocrit: 59.5 % — ABNORMAL HIGH (ref 37.5–51.0)
Hemoglobin: 20.2 g/dL (ref 13.0–17.7)
IMMATURE GRANS (ABS): 0 10*3/uL (ref 0.0–0.1)
IMMATURE GRANULOCYTES: 0 %
LYMPHS: 24 %
Lymphocytes Absolute: 2.1 10*3/uL (ref 0.7–3.1)
MCH: 30.1 pg (ref 26.6–33.0)
MCHC: 33.9 g/dL (ref 31.5–35.7)
MCV: 89 fL (ref 79–97)
MONOCYTES: 11 %
Monocytes Absolute: 1 10*3/uL — ABNORMAL HIGH (ref 0.1–0.9)
Neutrophils Absolute: 5.7 10*3/uL (ref 1.4–7.0)
Neutrophils: 63 %
PLATELETS: 243 10*3/uL (ref 150–450)
RBC: 6.72 x10E6/uL — AB (ref 4.14–5.80)
RDW: 14.6 % (ref 12.3–15.4)
WBC: 8.9 10*3/uL (ref 3.4–10.8)

## 2018-08-20 LAB — LIPID PANEL
CHOL/HDL RATIO: 7.5 ratio — AB (ref 0.0–5.0)
Cholesterol, Total: 188 mg/dL (ref 100–199)
HDL: 25 mg/dL — AB (ref >39)
TRIGLYCERIDES: 434 mg/dL — AB (ref 0–149)

## 2018-08-24 ENCOUNTER — Telehealth: Payer: Self-pay | Admitting: Family Medicine

## 2018-08-24 ENCOUNTER — Other Ambulatory Visit: Payer: Self-pay

## 2018-08-24 DIAGNOSIS — E781 Pure hyperglyceridemia: Secondary | ICD-10-CM

## 2018-08-24 DIAGNOSIS — R7989 Other specified abnormal findings of blood chemistry: Secondary | ICD-10-CM

## 2018-08-24 DIAGNOSIS — E291 Testicular hypofunction: Secondary | ICD-10-CM

## 2018-08-24 DIAGNOSIS — R945 Abnormal results of liver function studies: Secondary | ICD-10-CM

## 2018-08-24 NOTE — Telephone Encounter (Signed)
Patient aware of results.

## 2018-08-25 ENCOUNTER — Encounter: Payer: Self-pay | Admitting: Family Medicine

## 2018-09-03 ENCOUNTER — Ambulatory Visit (HOSPITAL_COMMUNITY): Payer: 59 | Admitting: Psychiatry

## 2018-09-04 ENCOUNTER — Ambulatory Visit (INDEPENDENT_AMBULATORY_CARE_PROVIDER_SITE_OTHER): Payer: 59 | Admitting: Psychiatry

## 2018-09-04 ENCOUNTER — Encounter (HOSPITAL_COMMUNITY): Payer: Self-pay | Admitting: Psychiatry

## 2018-09-04 VITALS — BP 130/80 | HR 72 | Ht 74.0 in | Wt 283.0 lb

## 2018-09-04 DIAGNOSIS — F411 Generalized anxiety disorder: Secondary | ICD-10-CM

## 2018-09-04 DIAGNOSIS — F331 Major depressive disorder, recurrent, moderate: Secondary | ICD-10-CM | POA: Diagnosis not present

## 2018-09-04 DIAGNOSIS — F410 Panic disorder [episodic paroxysmal anxiety] without agoraphobia: Secondary | ICD-10-CM

## 2018-09-04 DIAGNOSIS — F4010 Social phobia, unspecified: Secondary | ICD-10-CM | POA: Diagnosis not present

## 2018-09-04 MED ORDER — BUPROPION HCL 75 MG PO TABS: 75.0000 mg | ORAL_TABLET | Freq: Two times a day (BID) | ORAL | 1 refills | Status: DC

## 2018-09-04 MED ORDER — CLOMIPRAMINE HCL 25 MG PO CAPS: 50.0000 mg | ORAL_CAPSULE | Freq: Three times a day (TID) | ORAL | 1 refills | Status: DC

## 2018-09-04 MED ORDER — TRAZODONE HCL 100 MG PO TABS: 200.0000 mg | ORAL_TABLET | Freq: Every evening | ORAL | 1 refills | Status: DC | PRN

## 2018-09-04 MED ORDER — GABAPENTIN 300 MG PO CAPS: 600.0000 mg | ORAL_CAPSULE | Freq: Three times a day (TID) | ORAL | 1 refills | Status: DC

## 2018-09-04 NOTE — Progress Notes (Signed)
**Note Paul-Identified via Obfuscation** [place] MD/PA/NP OP Progress Note  09/04/2018 10:11 AM Paul Parrish  MRN:  [id number]  Chief Complaint: I stopped taking all the medication because they are not working.  I am feeling very anxious and depressed.  HPI: Paul Parrish is 47 year old Caucasian employed male who has been seeing Dr. Daron Parrish in the past who left the practice and now seen once Dr. De Parrish came for his appointment.  He was last seen by Paul Parrish August and then he decided to go back to [facility] where he had seen the psychiatrist in the past.  However his medicines were changed and he was prescribed Vyvanse, Klonopin, Viibryd, Effexor and his Wellbutrin, clomipramine and propanol was discontinued.  He was disappointed with the visit and stopped taking the medication.  He has been feeling very depressed, anxious more tired with lack of energy and motivation to do things.  Recently his stepdaughter who had Wilms tumor died on 08/28/2023.  His wife is very supportive.  He has 2 boys from his first marriage who are 56 and 47 years old.  He gets testosterone injection and use CPAP which helps his sleep.  He feels very anxious and get easily overwhelmed.  He admitted anhedonia and lack of energy to do things.  There are times when he feels very tearful but denies any suicidal thoughts or homicidal thought.  He like to go back on medication which had helped him in the past.  He is not drinking or using any illegal substances.  Though he has a history of heavy drinking in the past.  He feel Anafranil with a combination of Wellbutrin was working very well for his depression and anxiety but he had noticed some sexual side effects.  It is unclear if the sexual side effects or decreased libido due to low testosterone or antidepressant causing it.  He denies any paranoia or any hallucination.  He is working in [facility] at [employer].  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  He has no tremors, shakes or any EPS.  Visit Diagnosis:    ICD-10-CM   1. Social anxiety  disorder F40.10   2. Moderate episode of recurrent major depressive disorder (HCC) F33.1   3. Panic disorder F41.0   4. GAD (generalized anxiety disorder) F41.1     Past Psychiatric History: Reviewed. History of anxiety, depression and seen psychiatrist at neuropsychiatry in [place] but not happy with the treatment.  Saw Dr. Daron Parrish in 2018 and prescribed multiple medication including Prozac, Xanax, Effexor, Celexa, propanolol, Lamictal, Vistaril, Adderall, and finally got improvement on Anafranil and Wellbutrin combination.  Also had a history of ECT with no response.  No history of suicidal attempt or any inpatient psychiatric treatment.  Seen recently at [facility] and tried [place], Klonopin but did not work.  History of heavy drinking but claims to be sober.  Past Medical History:  Past Medical History:  Diagnosis Date  . Anxiety   . Depression   . Elevated hemoglobin ([place])   . Low testosterone   . Small bowel obstruction ([place]) 10/21/2016    Past Surgical History:  Procedure Laterality Date  . NASAL SINUS SURGERY  02/2016    Family Psychiatric History: Reviewed.  Family History:  Family History  Problem Relation Age of Onset  . Cancer Mother        kidney  . Hypertension Father   . Diabetes Father   . Diabetes Brother   . Cancer Paternal Grandmother     Social History:  Social History  Socioeconomic History  . Marital status: Married    Spouse name: Not on file  . Number of children: Not on file  . Years of education: Not on file  . Highest education level: Not on file  Occupational History  . Not on file  Social Needs  . Financial resource strain: Not on file  . Food insecurity:    Worry: Not on file    Inability: Not on file  . Transportation needs:    Medical: Not on file    Non-medical: Not on file  Tobacco Use  . Smoking status: Never Smoker  . Smokeless tobacco: Never Used  Substance and Sexual Activity  . Alcohol use: Yes    Comment:  10/22/2016 "nothing in over 1 year"  . Drug use: No  . Sexual activity: Yes    Partners: Female    Birth control/protection: None  Lifestyle  . Physical activity:    Days per week: Not on file    Minutes per session: Not on file  . Stress: Not on file  Relationships  . Social connections:    Talks on phone: Not on file    Gets together: Not on file    Attends religious service: Not on file    Active member of club or organization: Not on file    Attends meetings of clubs or organizations: Not on file    Relationship status: Not on file  Other Topics Concern  . Not on file  Social History Narrative  . Not on file    Allergies:  Allergies  Allergen Reactions  . Amoxicillin Other (See Comments)    High fever and possible heart racing  . Bactrim [Sulfamethoxazole-Trimethoprim] Other (See Comments)    Fever, body aches, chills  . Penicillins Other (See Comments)    High fever Has patient had a PCN reaction causing immediate rash, facial/tongue/throat swelling, SOB or lightheadedness with hypotension: No Has patient had a PCN reaction causing severe rash involving mucus membranes or skin necrosis: No Has patient had a PCN reaction that required hospitalization No Has patient had a PCN reaction occurring within the last 10 years: No If all of the above answers are "NO", then may proceed with Cephalosporin use.     Metabolic Disorder Labs: Lab Results  Component Value Date   HGBA1C 4.9% 08/13/2013   Lab Results  Component Value Date   PROLACTIN 12.4 10/09/2017   PROLACTIN 14.5 09/08/2015   Lab Results  Component Value Date   CHOL 188 08/19/2018   TRIG 434 (H) 08/19/2018   HDL 25 (L) 08/19/2018   CHOLHDL 7.5 (H) 08/19/2018   LDLCALC Comment 08/19/2018   LDLCALC 113 (H) 10/18/2017   Lab Results  Component Value Date   TSH 3.130 10/09/2017   TSH 2.680 11/08/2016    Therapeutic Level Labs: No results found for: LITHIUM No results found for: VALPROATE No  components found for:  CBMZ  Current Medications: Current Outpatient Medications  Medication Sig Dispense Refill  . fluticasone (FLONASE) 50 MCG/ACT nasal spray Place 1 spray into both nostrils 2 (two) times daily as needed for allergies or rhinitis. 16 g 6  . gabapentin (NEURONTIN) 300 MG capsule Take 2 capsules (600 mg total) by mouth 3 (three) times daily. 180 capsule 6  . sildenafil (REVATIO) 20 MG tablet TAKE TWO TO FIVE TABLETS BY MOUTH ONCE DAILY AS NEEDED 30 tablet 5  . Syringe/Needle, Disp, (SYRINGE 3CC/20GX1") 20G X 1" 3 ML MISC Use one to inject Testosterone every  14 days. 50 each 0  . testosterone cypionate (DEPOTESTOTERONE CYPIONATE) 100 MG/ML injection Inject 0.5 mLs (50 mg total) into the muscle every 7 (seven) days. For IM use only 2 mL 3  . traZODone (DESYREL) 100 MG tablet Take 2 tablets (200 mg total) by mouth at bedtime. 60 tablet 1   No current facility-administered medications for this visit.      Musculoskeletal: Strength & Muscle Tone: within normal limits Gait & Station: normal Patient leans: N/A  Psychiatric Specialty Exam: Review of Systems  Constitutional: Negative.   HENT: Negative.   Respiratory: Negative.   Musculoskeletal: Negative.   Skin: Negative.   Neurological: Negative.   Psychiatric/Behavioral: Positive for depression. The patient is nervous/anxious and has insomnia.     Blood pressure 130/80, pulse 72, height 6\' 2"  (1.88 m), weight 283 lb (128.4 kg).Body mass index is 36.34 kg/m.  General Appearance: Casual and Fairly Groomed  Eye Contact:  Fair  Speech:  Slow  Volume:  Decreased  Mood:  Anxious and Dysphoric  Affect:  Constricted and Depressed  Thought Process:  Goal Directed  Orientation:  Full (Time, Place, and Person)  Thought Content: Rumination   Suicidal Thoughts:  No  Homicidal Thoughts:  No  Memory:  Immediate;   Fair Recent;   Good Remote;   Good  Judgement:  Fair  Insight:  Present  Psychomotor Activity:  Decreased   Concentration:  Concentration: Fair and Attention Span: Fair  Recall:  Good  Fund of Knowledge: Good  Language: Good  Akathisia:  No  Handed:  Right  AIMS (if indicated): not done  Assets:  Communication Skills Desire for Improvement Housing Resilience Social Support  ADL's:  Intact  Cognition: WNL  Sleep:  Fair   Screenings: GAD-7     Office Visit from 11/08/2016 in [facility] Visit from 11/30/2015 in [facility] Visit from 09/28/2015 in [facility]  Total GAD-7 Score  16  13  11     PHQ2-9     Office Visit from 08/19/2018 in [facility] Visit from 03/12/2018 in [facility] Visit from 01/15/2018 in [facility] Office Visit from 10/22/2017 in [facility] Visit from 10/15/2017 in [facility]  PHQ-2 Total Score  0  0  0  0  0       Assessment and Plan: Social anxiety disorder, major depressive disorder, recurrent.  Generalized anxiety disorder.  I reviewed his notes from previous provider, medication and blood work results.  He has been experiencing increased anxiety depression since he is no longer taking his psychotropic medication.  He was not happy with [facility] and like to restart the medication that had helped him in the past.  I will discontinue Prozac, Effexor, Vyvanse and Klonopin as patient is not taking these medication.  We will start Anafranil 25 mg 3 times a day and Wellbutrin 75 mg twice a day.  He is taking gabapentin 600 mg 3 times a day.  We discussed in length polypharmacy, giving enough time to respond with medication and long-term prognosis.  He is not sure if Anafranil causes sexual side effects as patient has low testosterone and he gets testosterone regularly.  He is not interested in therapy.  I also suggested that he should have gene testing to  see if in the future if medicine needs to be changed then we can choose a better medication.  I  recommended to call us back if is any question or any concern.  I will see him again in 6 weeks.  Discussed safety concerns at any time having active suicidal thoughts or homicidal thought that he to call 911 or go to local emergency room.  More than 50% of the time spent in psychoeducation, counseling and coordination of care.  Time spent 40 minutes.   Kathlee Nations, MD 09/04/2018, 10:11 AM

## 2018-09-12 ENCOUNTER — Other Ambulatory Visit: Payer: Self-pay | Admitting: Physician Assistant

## 2018-09-14 NOTE — Telephone Encounter (Signed)
Last seen 09/04/18

## 2018-10-05 ENCOUNTER — Ambulatory Visit (INDEPENDENT_AMBULATORY_CARE_PROVIDER_SITE_OTHER): Payer: 59 | Admitting: Family Medicine

## 2018-10-05 ENCOUNTER — Encounter: Payer: Self-pay | Admitting: Family Medicine

## 2018-10-05 VITALS — BP 137/87 | HR 90 | Temp 97.2°F | Ht 74.0 in | Wt 282.0 lb

## 2018-10-05 DIAGNOSIS — J0111 Acute recurrent frontal sinusitis: Secondary | ICD-10-CM | POA: Diagnosis not present

## 2018-10-05 MED ORDER — DOXYCYCLINE MONOHYDRATE 100 MG PO CAPS: 100.0000 mg | ORAL_CAPSULE | Freq: Two times a day (BID) | ORAL | 0 refills | Status: AC

## 2018-10-05 NOTE — Progress Notes (Signed)
    Subjective:     Paul Parrish is a 48 y.o. male who presents for evaluation of symptoms of a URI, possible sinusitis. Symptoms include achiness, congestion, coryza, cough described as productive, facial pain, headache described as frontal fullness, nasal congestion, post nasal drip, purulent nasal discharge and sinus pressure. Onset of symptoms was 4 days ago, and has been gradually worsening since that time. Treatment to date: antibiotics at the end of November, states he was better for a few weeks and then symptoms returned gradually and became severe over the last 4 days. He has been using Flonase and Mucinex on a daily basis.   The following portions of the patient's history were reviewed and updated as appropriate: allergies, current medications, past family history, past medical history, past social history, past surgical history and problem list.  Review of Systems Constitutional: positive for chills, fatigue and malaise Eyes: positive for redness Ears, nose, mouth, throat, and face: positive for earaches, nasal congestion, sore throat and sinus pressure, rhinorrhea, postnasal drainage, ear fullness, congestion Respiratory: positive for cough Cardiovascular: negative Neurological: positive for headaches   Objective:    BP 137/87   Pulse 90   Temp (!) 97.2 F (36.2 C) (Oral)   Ht 6\' 2"  (1.88 m)   Wt 282 lb (127.9 kg)   BMI 36.21 kg/m  General appearance: alert, cooperative, appears stated age and mild distress Head: Normocephalic, without obvious abnormality, atraumatic Eyes: negative Ears: abnormal TM right ear - serous middle ear fluid and abnormal TM left ear - serous middle ear fluid Nose: purulent and scant discharge, mild congestion, turbinates red, swollen, moderate frontal sinus tenderness bilateral Throat: abnormal findings: mild oropharyngeal erythema Neck: mild anterior cervical adenopathy, no carotid bruit, no JVD, supple, symmetrical, trachea midline and thyroid  not enlarged, symmetric, no tenderness/mass/nodules Lungs: clear to auscultation bilaterally Heart: regular rate and rhythm, S1, S2 normal, no murmur, click, rub or gallop Pulses: 2+ and symmetric Skin: Skin color, texture, turgor normal. No rashes or lesions Lymph nodes: Cervical adenopathy: mild Neurologic: Grossly normal   Assessment:     Oshay was seen today for uri.  Diagnoses and all orders for this visit:  Acute recurrent frontal sinusitis Increase fluid intake. Continue Flonase daily. Continue Mucinex. Frequent saline nasal sprays. Avoid cigarette smoke. Add Zyrtec daily. Medications as prescribed. Report any new or worsening symptoms.  -     doxycycline (MONODOX) 100 MG capsule; Take 1 capsule (100 mg total) by mouth 2 (two) times daily for 7 days.    Plan:    Discussed the diagnosis and treatment of sinusitis. Suggested symptomatic OTC remedies. Nasal saline spray for congestion. doxycycline per orders. Nasal steroids per orders. Follow up as needed. Call in 5 days if symptoms aren't resolving. Follow up with PCP as needed.    The above assessment and management plan was discussed with the patient. The patient verbalized understanding of and has agreed to the management plan. Patient is aware to call the clinic if symptoms fail to improve or worsen. Patient is aware when to return to the clinic for a follow-up visit. Patient educated on when it is appropriate to go to the emergency department.   Monia Pouch, FNP-C [facility] [address] [place], [place] [address] [phone]

## 2018-10-05 NOTE — Patient Instructions (Signed)
Place upper respiratory infection patient instructions here.   Sinusitis, Adult Sinusitis is inflammation of your sinuses. Sinuses are hollow spaces in the bones around your face. Your sinuses are located:  Around your eyes.  In the middle of your forehead.  Behind your nose.  In your cheekbones. Mucus normally drains out of your sinuses. When your nasal tissues become inflamed or swollen, mucus can become trapped or blocked. This allows bacteria, viruses, and fungi to grow, which leads to infection. Most infections of the sinuses are caused by a virus. Sinusitis can develop quickly. It can last for up to 4 weeks (acute) or for more than 12 weeks (chronic). Sinusitis often develops after a cold. What are the causes? This condition is caused by anything that creates swelling in the sinuses or stops mucus from draining. This includes:  Allergies.  Asthma.  Infection from bacteria or viruses.  Deformities or blockages in your nose or sinuses.  Abnormal growths in the nose (nasal polyps).  Pollutants, such as chemicals or irritants in the air.  Infection from fungi (rare). What increases the risk? You are more likely to develop this condition if you:  Have a weak body defense system (immune system).  Do a lot of swimming or diving.  Overuse nasal sprays.  Smoke. What are the signs or symptoms? The main symptoms of this condition are pain and a feeling of pressure around the affected sinuses. Other symptoms include:  Stuffy nose or congestion.  Thick drainage from your nose.  Swelling and warmth over the affected sinuses.  Headache.  Upper toothache.  A cough that may get worse at night.  Extra mucus that collects in the throat or the back of the nose (postnasal drip).  Decreased sense of smell and taste.  Fatigue.  A fever.  Sore throat.  Bad breath. How is this diagnosed? This condition is diagnosed based on:  Your symptoms.  Your medical  history.  A physical exam.  Tests to find out if your condition is acute or chronic. This may include: ? Checking your nose for nasal polyps. ? Viewing your sinuses using a device that has a light (endoscope). ? Testing for allergies or bacteria. ? Imaging tests, such as an MRI or CT scan. In rare cases, a bone biopsy may be done to rule out more serious types of fungal sinus disease. How is this treated? Treatment for sinusitis depends on the cause and whether your condition is chronic or acute.  If caused by a virus, your symptoms should go away on their own within 10 days. You may be given medicines to relieve symptoms. They include: ? Medicines that shrink swollen nasal passages (topical intranasal decongestants). ? Medicines that treat allergies (antihistamines). ? A spray that eases inflammation of the nostrils (topical intranasal corticosteroids). ? Rinses that help get rid of thick mucus in your nose (nasal saline washes).  If caused by bacteria, your health care provider may recommend waiting to see if your symptoms improve. Most bacterial infections will get better without antibiotic medicine. You may be given antibiotics if you have: ? A severe infection. ? A weak immune system.  If caused by narrow nasal passages or nasal polyps, you may need to have surgery. Follow these instructions at home: Medicines  Take, use, or apply over-the-counter and prescription medicines only as told by your health care provider. These may include nasal sprays.  If you were prescribed an antibiotic medicine, take it as told by your health care provider. Do   not stop taking the antibiotic even if you start to feel better. Hydrate and humidify   Drink enough fluid to keep your urine pale yellow. Staying hydrated will help to thin your mucus.  Use a cool mist humidifier to keep the humidity level in your home above 50%.  Inhale steam for 10-15 minutes, 3-4 times a day, or as told by your  health care provider. You can do this in the bathroom while a hot shower is running.  Limit your exposure to cool or dry air. Rest  Rest as much as possible.  Sleep with your head raised (elevated).  Make sure you get enough sleep each night. General instructions   Apply a warm, moist washcloth to your face 3-4 times a day or as told by your health care provider. This will help with discomfort.  Wash your hands often with soap and water to reduce your exposure to germs. If soap and water are not available, use hand sanitizer.  Do not smoke. Avoid being around people who are smoking (secondhand smoke).  Keep all follow-up visits as told by your health care provider. This is important. Contact a health care provider if:  You have a fever.  Your symptoms get worse.  Your symptoms do not improve within 10 days. Get help right away if:  You have a severe headache.  You have persistent vomiting.  You have severe pain or swelling around your face or eyes.  You have vision problems.  You develop confusion.  Your neck is stiff.  You have trouble breathing. Summary  Sinusitis is soreness and inflammation of your sinuses. Sinuses are hollow spaces in the bones around your face.  This condition is caused by nasal tissues that become inflamed or swollen. The swelling traps or blocks the flow of mucus. This allows bacteria, viruses, and fungi to grow, which leads to infection.  If you were prescribed an antibiotic medicine, take it as told by your health care provider. Do not stop taking the antibiotic even if you start to feel better.  Keep all follow-up visits as told by your health care provider. This is important. This information is not intended to replace advice given to you by your health care provider. Make sure you discuss any questions you have with your health care provider. Document Released: 09/16/2005 Document Revised: 02/16/2018 Document Reviewed:  02/16/2018 Elsevier Interactive Patient Education  2019 [employer]  

## 2018-10-14 ENCOUNTER — Encounter: Payer: Self-pay | Admitting: Family Medicine

## 2018-10-14 DIAGNOSIS — R5383 Other fatigue: Secondary | ICD-10-CM

## 2018-10-14 MED ORDER — HYDROXYZINE HCL 25 MG PO TABS: 25.0000 mg | ORAL_TABLET | Freq: Three times a day (TID) | ORAL | 2 refills | Status: DC | PRN

## 2018-10-14 NOTE — Telephone Encounter (Signed)
Please let the patient know that I sent Atarax for him which he can just use as needed when he is having a panic attack. [name], MD [facility] 10/14/2018, 2:59 PM

## 2018-10-24 DIAGNOSIS — J219 Acute bronchiolitis, unspecified: Secondary | ICD-10-CM | POA: Diagnosis not present

## 2018-10-26 ENCOUNTER — Ambulatory Visit: Payer: 59 | Admitting: Family Medicine

## 2018-11-02 ENCOUNTER — Other Ambulatory Visit: Payer: Self-pay | Admitting: "Endocrinology

## 2018-11-05 ENCOUNTER — Ambulatory Visit (INDEPENDENT_AMBULATORY_CARE_PROVIDER_SITE_OTHER): Payer: 59 | Admitting: Psychiatry

## 2018-11-05 ENCOUNTER — Encounter (HOSPITAL_COMMUNITY): Payer: Self-pay | Admitting: Psychiatry

## 2018-11-05 DIAGNOSIS — F411 Generalized anxiety disorder: Secondary | ICD-10-CM

## 2018-11-05 DIAGNOSIS — F331 Major depressive disorder, recurrent, moderate: Secondary | ICD-10-CM

## 2018-11-05 DIAGNOSIS — F4010 Social phobia, unspecified: Secondary | ICD-10-CM

## 2018-11-05 MED ORDER — LORAZEPAM 0.5 MG PO TABS: 0.5000 mg | ORAL_TABLET | Freq: Every day | ORAL | 1 refills | Status: DC | PRN

## 2018-11-05 MED ORDER — DULOXETINE HCL 20 MG PO CPEP: ORAL_CAPSULE | ORAL | 1 refills | Status: DC

## 2018-11-05 MED ORDER — TRAZODONE HCL 100 MG PO TABS: 200.0000 mg | ORAL_TABLET | Freq: Every evening | ORAL | 1 refills | Status: DC | PRN

## 2018-11-05 MED ORDER — GABAPENTIN 300 MG PO CAPS: 600.0000 mg | ORAL_CAPSULE | Freq: Three times a day (TID) | ORAL | 1 refills | Status: DC

## 2018-11-05 MED ORDER — BUPROPION HCL ER (XL) 150 MG PO TB24: 150.0000 mg | ORAL_TABLET | Freq: Every day | ORAL | 1 refills | Status: DC

## 2018-11-05 NOTE — Progress Notes (Signed)
[facility] MD/PA/NP OP Progress Note  11/05/2018 10:43 AM Paul Parrish  MRN:  [id number]  Chief Complaint: I am not doing good with Anafranil.  It used to work but now I feel the same.  I also cannot afford it is very expensive.  HPI: Paul Parrish came for his appointment.  We started him on Anafranil which he had a good response in the past but he did not see any improvement.  He remains very anxious, sad, depressed and have no motivation to do things.  Is taking Wellbutrin but he feels alone is not helping.  He gets very nervous and anxious.  He has social anxiety and he feels that people judge him and watching him.  He endorsed feeling sad and is still grief about losing his stepdaughter who died due to Wilms tumor in 2023/09/01.  His sleep on and off but feel the trazodone help sometimes.  He is getting testosterone injection and using CPAP.  Patient denies any paranoia or any hallucination.  He denies any suicidal thoughts or any homicidal thought.  He had a history of heavy drinking in 2016 but claims to be sober since then.  In the past he had tried Ativan by his primary care physician which worked very well but since he changed insurance his physician is no longer accepting his insurance.  Patient works at [employer] at [employer].  He likes his job but sometimes overwhelmed when he has meetings.  His appetite is okay.  Recently he tried Atarax 25 mg but it make him very sleepy and groggy and he stopped taking it.  He has no tremors or shakes.  He did gene site testing on the last visit.  I reviewed the testing results with him.  Cymbalta, Pristiq, Pamelor and Remeron came in favorable choices.  We discussed to try Cymbalta and he agreed.  Visit Diagnosis:    ICD-10-CM   1. Social anxiety disorder F40.10 buPROPion (WELLBUTRIN XL) 150 MG 24 hr tablet    traZODone (DESYREL) 100 MG tablet    DULoxetine (CYMBALTA) 20 MG capsule    LORazepam (ATIVAN) 0.5 MG tablet  2. Moderate episode of recurrent major  depressive disorder (HCC) F33.1 buPROPion (WELLBUTRIN XL) 150 MG 24 hr tablet    DULoxetine (CYMBALTA) 20 MG capsule  3. GAD (generalized anxiety disorder) F41.1 traZODone (DESYREL) 100 MG tablet    DULoxetine (CYMBALTA) 20 MG capsule    Past Psychiatric History: Reviewed. H/O anxiety, depression and seen psychiatrist at neuropsychiatry in [place] but not happy with the treatment.  Saw Dr. Daron Offer in 2018 and prescribed multiple medication including Prozac, Xanax, Effexor, Celexa, propanolol, Lamictal, Vistaril, Adderall, and finally got improvement on Anafranil and Wellbutrin combination. H/O of ECT with no response.  No history of suicidal attempt or any inpatient psychiatric treatment.  [facility] and tried [place], Klonopin but did not work.  History of heavy drinking but claims to be sober.  Past Medical History:  Past Medical History:  Diagnosis Date  . Anxiety   . Depression   . Elevated hemoglobin ([place])   . Low testosterone   . Small bowel obstruction ([place]) 10/21/2016    Past Surgical History:  Procedure Laterality Date  . NASAL SINUS SURGERY  02/2016    Family Psychiatric History: Reviewed.  Family History:  Family History  Problem Relation Age of Onset  . Cancer Mother        kidney  . Hypertension Father   . Diabetes Father   .  Diabetes Brother   . Cancer Paternal Grandmother     Social History:  Social History   Socioeconomic History  . Marital status: Married    Spouse name: Not on file  . Number of children: Not on file  . Years of education: Not on file  . Highest education level: Not on file  Occupational History  . Not on file  Social Needs  . Financial resource strain: Not on file  . Food insecurity:    Worry: Not on file    Inability: Not on file  . Transportation needs:    Medical: Not on file    Non-medical: Not on file  Tobacco Use  . Smoking status: Never Smoker  . Smokeless tobacco: Never Used  Substance and  Sexual Activity  . Alcohol use: Yes    Comment: 10/22/2016 "nothing in over 1 year"  . Drug use: No  . Sexual activity: Yes    Partners: Female    Birth control/protection: None  Lifestyle  . Physical activity:    Days per week: Not on file    Minutes per session: Not on file  . Stress: Not on file  Relationships  . Social connections:    Talks on phone: Not on file    Gets together: Not on file    Attends religious service: Not on file    Active member of club or organization: Not on file    Attends meetings of clubs or organizations: Not on file    Relationship status: Not on file  Other Topics Concern  . Not on file  Social History Narrative  . Not on file    Allergies:  Allergies  Allergen Reactions  . Amoxicillin Other (See Comments)    High fever and possible heart racing  . Bactrim [Sulfamethoxazole-Trimethoprim] Other (See Comments)    Fever, body aches, chills  . Penicillins Other (See Comments)    High fever Has patient had a PCN reaction causing immediate rash, facial/tongue/throat swelling, SOB or lightheadedness with hypotension: No Has patient had a PCN reaction causing severe rash involving mucus membranes or skin necrosis: No Has patient had a PCN reaction that required hospitalization No Has patient had a PCN reaction occurring within the last 10 years: No If all of the above answers are "NO", then may proceed with Cephalosporin use.     Metabolic Disorder Labs: Lab Results  Component Value Date   HGBA1C 5.7 10/18/2017   Lab Results  Component Value Date   PROLACTIN 12.4 10/09/2017   PROLACTIN 14.5 09/08/2015   Lab Results  Component Value Date   CHOL 188 08/19/2018   TRIG 434 (H) 08/19/2018   HDL 25 (L) 08/19/2018   CHOLHDL 7.5 (H) 08/19/2018   LDLCALC Comment 08/19/2018   LDLCALC 113 (H) 10/18/2017   Lab Results  Component Value Date   TSH 3.130 10/09/2017   TSH 2.680 11/08/2016    Therapeutic Level Labs: No results found for:  LITHIUM No results found for: VALPROATE No components found for:  CBMZ  Current Medications: Current Outpatient Medications  Medication Sig Dispense Refill  . buPROPion (WELLBUTRIN) 75 MG tablet Take 1 tablet (75 mg total) by mouth 2 (two) times daily. 60 tablet 1  . clomiPRAMINE (ANAFRANIL) 25 MG capsule Take 2 capsules (50 mg total) by mouth 3 (three) times daily after meals. 90 capsule 1  . fluticasone (FLONASE) 50 MCG/ACT nasal spray Place 1 spray into both nostrils 2 (two) times daily as needed for allergies or  rhinitis. 16 g 6  . gabapentin (NEURONTIN) 300 MG capsule Take 2 capsules (600 mg total) by mouth 3 (three) times daily. 180 capsule 1  . hydrOXYzine (ATARAX/VISTARIL) 25 MG tablet Take 1 tablet (25 mg total) by mouth 3 (three) times daily as needed. 30 tablet 2  . sildenafil (REVATIO) 20 MG tablet TAKE 2 TO 5 TABLETS BY MOUTH ONCE DAILY AS NEEDED 30 tablet 2  . Syringe/Needle, Disp, (SYRINGE 3CC/20GX1") 20G X 1" 3 ML MISC Use one to inject Testosterone every 14 days. 6 each 8  . testosterone cypionate (DEPOTESTOTERONE CYPIONATE) 100 MG/ML injection Inject 0.5 mLs (50 mg total) into the muscle every 7 (seven) days. For IM use only 2 mL 3  . traZODone (DESYREL) 100 MG tablet Take 2 tablets (200 mg total) by mouth at bedtime as needed for sleep. 30 tablet 1   No current facility-administered medications for this visit.      Musculoskeletal: Strength & Muscle Tone: within normal limits Gait & Station: normal Patient leans: N/A  Psychiatric Specialty Exam: ROS  Blood pressure 133/89, pulse (!) 118, height 6\' 2"  (1.88 m), weight 281 lb (127.5 kg), SpO2 98 %.Body mass index is 36.08 kg/m.  General Appearance: Casual and Shy  Eye Contact:  Fair  Speech:  Slow  Volume:  Decreased  Mood:  Anxious and Dysphoric  Affect:  Constricted and Depressed  Thought Process:  Descriptions of Associations: Intact  Orientation:  Full (Time, Place, and Person)  Thought Content: Rumination    Suicidal Thoughts:  No  Homicidal Thoughts:  No  Memory:  Immediate;   Good Recent;   Fair Remote;   Good  Judgement:  Good  Insight:  Good  Psychomotor Activity:  Decreased  Concentration:  Concentration: Fair and Attention Span: Fair  Recall:  Good  Fund of Knowledge: Good  Language: Good  Akathisia:  No  Handed:  Right  AIMS (if indicated): not done  Assets:  Communication Skills Desire for [place] Talents/Skills Transportation  ADL's:  Intact  Cognition: WNL  Sleep:  Improved   Screenings: GAD-7     Office Visit from 11/08/2016 in [facility] Visit from 11/30/2015 in [facility] Visit from 09/28/2015 in [facility]  Total GAD-7 Score  16  13  11     PHQ2-9     Office Visit from 10/05/2018 in [facility] Visit from 08/19/2018 in [facility] Visit from 03/12/2018 in [facility] Visit from 01/15/2018 in [facility] Office Visit from 10/22/2017 in [facility]  PHQ-2 Total Score  0  0  0  0  0       Assessment and Plan: Social anxiety disorder.  Major depressive disorder, recurrent.  Generalized anxiety disorder.  I reviewed gene site testing results with him.  Patient is not feeling better with Anafranil even though he had tried with good response in the past.  I will discontinue Anafranil.  We will try Cymbalta as it is in a favorable call him.  We will start 20 mg daily for 1 week and then twice a day.  I would also change Wellbutrin to XL 150 mg once a day for better absorption.  We will provide lorazepam 0.5 mg to take as needed for severe anxiety especially when he has meetings.  Continue gabapentin 600 mg 3 times a day.  Discussed medication side effects and benefits  especially benzodiazepine dependence tolerance and withdrawal.  Patient  had a distant history of drinking but claims to be sober for a while.  Patient is not interested in therapy.  Mended to call us back if he has any question or any concern.  I will see him again in 6 weeks.   Kathlee Nations, MD 11/05/2018, 10:43 AM

## 2018-11-06 ENCOUNTER — Other Ambulatory Visit: Payer: Self-pay | Admitting: Family Medicine

## 2018-11-25 ENCOUNTER — Ambulatory Visit: Payer: 59 | Admitting: Family Medicine

## 2018-12-09 ENCOUNTER — Other Ambulatory Visit: Payer: Self-pay

## 2018-12-09 ENCOUNTER — Ambulatory Visit (INDEPENDENT_AMBULATORY_CARE_PROVIDER_SITE_OTHER): Payer: 59 | Admitting: Psychiatry

## 2018-12-09 ENCOUNTER — Encounter (HOSPITAL_COMMUNITY): Payer: Self-pay | Admitting: Psychiatry

## 2018-12-09 VITALS — BP 140/82 | Ht 74.0 in | Wt 285.0 lb

## 2018-12-09 DIAGNOSIS — F4010 Social phobia, unspecified: Secondary | ICD-10-CM | POA: Diagnosis not present

## 2018-12-09 DIAGNOSIS — F411 Generalized anxiety disorder: Secondary | ICD-10-CM | POA: Diagnosis not present

## 2018-12-09 DIAGNOSIS — F331 Major depressive disorder, recurrent, moderate: Secondary | ICD-10-CM

## 2018-12-09 MED ORDER — FLUVOXAMINE MALEATE 50 MG PO TABS: ORAL_TABLET | ORAL | 0 refills | Status: DC

## 2018-12-09 MED ORDER — LORAZEPAM 0.5 MG PO TABS: ORAL_TABLET | ORAL | 0 refills | Status: DC

## 2018-12-09 NOTE — Progress Notes (Addendum)
[place] MD/PA/NP OP Progress Note  12/09/2018 10:03 AM Paul Parrish  MRN:  [id number]  Chief Complaint: I tried Cymbalta but after 1 week it caused him a mood swings and I decided to start.  Still have a lot of anxiety and nervousness.  HPI: Paul Parrish came for his appointment.  We started him on Cymbalta last week.  After taking for 2 weeks he started to notice irritability, anger, mood swing and decided to stop.  He is taking Ativan as needed but he feels he need to go up because he remains very anxious and nervous.  He feels people judge him and he gets overwhelmed.  Patient remains very anxious and sad.  He tried multiple medication in the past but poor outcome.  Cymbalta was 1 of the medication that we choose after we did gene site testing.  He denies any suicidal thoughts or homicidal thought.  He works in Conservator, museum/gallery at [employer].  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  He denies any paranoia, hallucination or any suicidal thoughts.  His energy level is okay.  He like to try a different medication.  I reviewed his gene site testing results and recommended to try fluvoxamine.  I also believe he should see a therapist for coping skills.    Visit Diagnosis:    ICD-10-CM   1. GAD (generalized anxiety disorder) F41.1 fluvoxaMINE (LUVOX) 50 MG tablet  2. Social anxiety disorder F40.10 fluvoxaMINE (LUVOX) 50 MG tablet    LORazepam (ATIVAN) 0.5 MG tablet  3. Moderate episode of recurrent major depressive disorder (HCC) F33.1 fluvoxaMINE (LUVOX) 50 MG tablet    Past Psychiatric History: Reviewed. H/O anxiety, depression and seen psychiatrist at neuropsychiatry in [place] but not happy with the treatment. Saw Dr. Daron Parrish in 2018 and prescribed multiple medication including Prozac, Xanax, Effexor, Celexa, propanolol, Lamictal, Vistaril, Adderall, Cymbalta (increased irritability ), Anafranil (initially work and then stopped working ,could not afford) and Wellbutrin. H/O of ECT with no response. No h/o suicidal  attempt or inpatient psychiatric treatment. [facility] and tried [place], Klonopin but did not work. H/O heavy drinking but claims to be sober.    Past Medical History:  Past Medical History:  Diagnosis Date  . Anxiety   . Depression   . Elevated hemoglobin ([place])   . Low testosterone   . Small bowel obstruction ([place]) 10/21/2016    Past Surgical History:  Procedure Laterality Date  . NASAL SINUS SURGERY  02/2016    Family Psychiatric History: Reviewed  Family History:  Family History  Problem Relation Age of Onset  . Cancer Mother        kidney  . Hypertension Father   . Diabetes Father   . Diabetes Brother   . Cancer Paternal Grandmother     Social History:  Social History   Socioeconomic History  . Marital status: Married    Spouse name: Not on file  . Number of children: Not on file  . Years of education: Not on file  . Highest education level: Not on file  Occupational History  . Not on file  Social Needs  . Financial resource strain: Not on file  . Food insecurity:    Worry: Not on file    Inability: Not on file  . Transportation needs:    Medical: Not on file    Non-medical: Not on file  Tobacco Use  . Smoking status: Never Smoker  . Smokeless tobacco: Never Used  Substance and Sexual Activity  .  Alcohol use: Yes    Comment: 10/22/2016 "nothing in over 1 year"  . Drug use: No  . Sexual activity: Yes    Partners: Female    Birth control/protection: None  Lifestyle  . Physical activity:    Days per week: Not on file    Minutes per session: Not on file  . Stress: Not on file  Relationships  . Social connections:    Talks on phone: Not on file    Gets together: Not on file    Attends religious service: Not on file    Active member of club or organization: Not on file    Attends meetings of clubs or organizations: Not on file    Relationship status: Not on file  Other Topics Concern  . Not on file  Social History Narrative   . Not on file    Allergies:  Allergies  Allergen Reactions  . Amoxicillin Other (See Comments)    High fever and possible heart racing  . Bactrim [Sulfamethoxazole-Trimethoprim] Other (See Comments)    Fever, body aches, chills  . Penicillins Other (See Comments)    High fever Has patient had a PCN reaction causing immediate rash, facial/tongue/throat swelling, SOB or lightheadedness with hypotension: No Has patient had a PCN reaction causing severe rash involving mucus membranes or skin necrosis: No Has patient had a PCN reaction that required hospitalization No Has patient had a PCN reaction occurring within the last 10 years: No If all of the above answers are "NO", then may proceed with Cephalosporin use.     Metabolic Disorder Labs: Lab Results  Component Value Date   HGBA1C 5.7 10/18/2017   Lab Results  Component Value Date   PROLACTIN 12.4 10/09/2017   PROLACTIN 14.5 09/08/2015   Lab Results  Component Value Date   CHOL 188 08/19/2018   TRIG 434 (H) 08/19/2018   HDL 25 (L) 08/19/2018   CHOLHDL 7.5 (H) 08/19/2018   LDLCALC Comment 08/19/2018   LDLCALC 113 (H) 10/18/2017   Lab Results  Component Value Date   TSH 3.130 10/09/2017   TSH 2.680 11/08/2016    Therapeutic Level Labs: No results found for: LITHIUM No results found for: VALPROATE No components found for:  CBMZ  Current Medications: Current Outpatient Medications  Medication Sig Dispense Refill  . buPROPion (WELLBUTRIN XL) 150 MG 24 hr tablet Take 1 tablet (150 mg total) by mouth daily. 30 tablet 1  . fluticasone (FLONASE) 50 MCG/ACT nasal spray Place 1 spray into both nostrils 2 (two) times daily as needed for allergies or rhinitis. 16 g 6  . gabapentin (NEURONTIN) 300 MG capsule Take 2 capsules (600 mg total) by mouth 3 (three) times daily. 180 capsule 1  . LORazepam (ATIVAN) 0.5 MG tablet Take one tab daily as needed for anxiety and 2nd id needed again 40 tablet 0  . sildenafil (REVATIO) 20  MG tablet TAKE 2 TO 5 TABLETS BY MOUTH ONCE DAILY AS NEEDED 30 tablet 0  . Syringe/Needle, Disp, (SYRINGE 3CC/20GX1") 20G X 1" 3 ML MISC Use one to inject Testosterone every 14 days. 6 each 8  . testosterone cypionate (DEPOTESTOTERONE CYPIONATE) 100 MG/ML injection Inject 0.5 mLs (50 mg total) into the muscle every 7 (seven) days. For IM use only 2 mL 3  . traZODone (DESYREL) 100 MG tablet Take 2 tablets (200 mg total) by mouth at bedtime as needed for sleep. 30 tablet 1  . fluvoxaMINE (LUVOX) 50 MG tablet Take 1/2 tab daily for one  week and than full tab daily 30 tablet 0   No current facility-administered medications for this visit.      Musculoskeletal: Strength & Muscle Tone: within normal limits Gait & Station: normal Patient leans: N/A  Psychiatric Specialty Exam: ROS  Blood pressure 140/82, height 6\' 2"  (1.88 m), weight 285 lb (129.3 kg).Body mass index is 36.59 kg/m.  General Appearance: Casual  Eye Contact:  Fair  Speech:  Slow  Volume:  Decreased  Mood:  Anxious and Dysphoric  Affect:  Constricted  Thought Process:  Goal Directed  Orientation:  Full (Time, Place, and Person)  Thought Content: Rumination   Suicidal Thoughts:  No  Homicidal Thoughts:  No  Memory:  Immediate;   Good Recent;   Good Remote;   Good  Judgement:  Good  Insight:  Good  Psychomotor Activity:  Decreased  Concentration:  Concentration: Fair and Attention Span: Fair  Recall:  Good  Fund of Knowledge: Good  Language: Good  Akathisia:  No  Handed:  Right  AIMS (if indicated): not done  Assets:  Communication Skills Desire for Improvement Housing Resilience Talents/Skills Transportation  ADL's:  Intact  Cognition: WNL  Sleep:  Good   Screenings: GAD-7     Office Visit from 11/08/2016 in [facility] Visit from 11/30/2015 in [facility] Visit from 09/28/2015 in [facility]  Total GAD-7 Score  16  13  11      PHQ2-9     Office Visit from 10/05/2018 in [facility] Visit from 08/19/2018 in [facility] Visit from 03/12/2018 in [facility] Visit from 01/15/2018 in [facility] Office Visit from 10/22/2017 in [facility]  PHQ-2 Total Score  0  0  0  0  0       Assessment and Plan: Social anxiety disorder.  Major depressive disorder, recurrent.  Generalized anxiety disorder.  I will discontinue Cymbalta since it is causing increased irritability and anger.  Continue Wellbutrin XL 150 mg daily and we will try fluvoxamine 50 mg to take half tablet for 1 week and then full tablet daily.  Again discussed medication side effects and benefits.  I will also increase Ativan to take 0.5 mg daily and if needed second dose to help with anxiety.  I would also referred him to see a therapist in this office for CBT.  Continue gabapentin 600 mg 3 times a day.  Recommended to call us back if he has any question or any concern.  Follow-up in 6 weeks.   Kathlee Nations, MD 12/09/2018, 10:03 AM

## 2018-12-13 ENCOUNTER — Other Ambulatory Visit: Payer: Self-pay | Admitting: "Endocrinology

## 2018-12-30 ENCOUNTER — Ambulatory Visit (HOSPITAL_COMMUNITY): Payer: 59 | Admitting: Psychiatry

## 2019-01-03 ENCOUNTER — Other Ambulatory Visit (HOSPITAL_COMMUNITY): Payer: Self-pay | Admitting: Psychiatry

## 2019-01-03 DIAGNOSIS — F4010 Social phobia, unspecified: Secondary | ICD-10-CM

## 2019-01-06 ENCOUNTER — Other Ambulatory Visit: Payer: Self-pay

## 2019-01-06 ENCOUNTER — Ambulatory Visit (INDEPENDENT_AMBULATORY_CARE_PROVIDER_SITE_OTHER): Payer: 59 | Admitting: Psychiatry

## 2019-01-06 DIAGNOSIS — F411 Generalized anxiety disorder: Secondary | ICD-10-CM | POA: Diagnosis not present

## 2019-01-06 DIAGNOSIS — F4010 Social phobia, unspecified: Secondary | ICD-10-CM | POA: Diagnosis not present

## 2019-01-06 DIAGNOSIS — F331 Major depressive disorder, recurrent, moderate: Secondary | ICD-10-CM

## 2019-01-06 MED ORDER — BUPROPION HCL ER (XL) 150 MG PO TB24: 150.0000 mg | ORAL_TABLET | Freq: Every day | ORAL | 0 refills | Status: DC

## 2019-01-06 MED ORDER — GABAPENTIN 300 MG PO CAPS: 600.0000 mg | ORAL_CAPSULE | Freq: Three times a day (TID) | ORAL | 2 refills | Status: DC

## 2019-01-06 MED ORDER — LORAZEPAM 0.5 MG PO TABS: ORAL_TABLET | ORAL | 2 refills | Status: DC

## 2019-01-06 MED ORDER — TRAZODONE HCL 100 MG PO TABS: 100.0000 mg | ORAL_TABLET | Freq: Every evening | ORAL | 0 refills | Status: DC | PRN

## 2019-01-06 NOTE — Progress Notes (Signed)
Virtual Visit via Telephone Note  I connected with Paul Parrish on 01/06/19 at 11:40 AM EDT by telephone and verified that I am speaking with the correct person using two identifiers.   I discussed the limitations, risks, security and privacy concerns of performing an evaluation and management service by telephone and the availability of in person appointments. I also discussed with the patient that there may be a patient responsible charge related to this service. The patient expressed understanding and agreed to proceed.   History of Present Illness: Patient was evaluated through phone session.  He stopped taking Luvox because it did not help and he feels it make him more anxious.  We choose Luvox after reviewing [employer] testing results.  Overall he feel much better since Ativan he is taking.  He is sleeping better with trazodone and only taking 100 mg at bedtime.  He is working from home and that had helped him a lot.  Since he does not go outside he does not feel very anxious or nervous.  He works in Conservator, museum/gallery at [employer].  He had appointment to see Romelle Starcher but it was canceled due to pandemic coronavirus.  He is tolerating his medication and reported no tremors shakes or any EPS.  He denies any paranoia or any hallucination.  He like to continue Wellbutrin, gabapentin, trazodone and Ativan as needed.  He is not drinking or using any illegal substances.  Past Psychiatric History: Reviewed. H/Oanxiety, depression and seen psychiatrist at neuropsychiatry in [place] but not happy with the treatment. Saw Dr. Daron Offer in 2018 and prescribed multiple medication including Prozac, Xanax, Effexor, Celexa, propanolol, Lamictal, Vistaril, Adderall, Cymbalta (increased irritability ), Anafranil (initially work and then stopped working ,could not afford) and Luvox (did not help). H/Oof ECT with no response. No h/o suicidal attempt or inpatient psychiatric treatment. [facility] and tried [place], Klonopin but did not work. H/O heavy drinking but claims to be sober.     Observations/Objective: Limited mental status examination done on the phone.  Patient described his mood okay.  His speech is clear, coherent and relevant.  There were no flight of ideas or loose associations.  His thought process logical, goal-directed.  There were no delusions, paranoia or any grandiosity.  He denies any auditory or visual hallucination.  He denies any active or passive suicidal thoughts or homicidal thought.  He reported no tremors, shakes or any EPS.  He is alert and oriented x3.  His fund of knowledge is adequate.  His cognition is good.  His insight judgment is okay.  Assessment and Plan: Generalized anxiety disorder.  Social anxiety disorder.  Major depressive disorder, recurrent.  I will discontinue Luvox since he did not see any improvement.  He is doing better on his current medication.  He is taking Ativan 0.5 mg daily and second if needed.  He like to continue Ativan 0.5 mg daily and second as needed, gabapentin 600 mg 3 times a day and reduce trazodone 200 mg at bedtime.  I encourage to keep appointment with a therapist even if it is on the phone for CBT.  Discussed medication side effects and benefits.  Recommended to call us back if he has any question or any concern.  Follow-up in 3 months.  See a therapist even it is on the phone to help his gabapentin  Follow Up Instructions:    I discussed the assessment and treatment plan with the patient. The patient was provided an  opportunity to ask questions and all were answered. The patient agreed with the plan and demonstrated an understanding of the instructions.   The patient was advised to call back or seek an in-person evaluation if the symptoms worsen or if the condition fails to improve as anticipated.  I provided 20 minutes of non-face-to-face time during this encounter.   Kathlee Nations, MD

## 2019-03-31 ENCOUNTER — Other Ambulatory Visit: Payer: Self-pay | Admitting: "Endocrinology

## 2019-04-07 ENCOUNTER — Ambulatory Visit (INDEPENDENT_AMBULATORY_CARE_PROVIDER_SITE_OTHER): Payer: 59 | Admitting: Psychiatry

## 2019-04-07 ENCOUNTER — Other Ambulatory Visit: Payer: Self-pay

## 2019-04-07 ENCOUNTER — Encounter (HOSPITAL_COMMUNITY): Payer: Self-pay | Admitting: Psychiatry

## 2019-04-07 DIAGNOSIS — F331 Major depressive disorder, recurrent, moderate: Secondary | ICD-10-CM

## 2019-04-07 DIAGNOSIS — F411 Generalized anxiety disorder: Secondary | ICD-10-CM | POA: Diagnosis not present

## 2019-04-07 DIAGNOSIS — F4010 Social phobia, unspecified: Secondary | ICD-10-CM

## 2019-04-07 MED ORDER — GABAPENTIN 300 MG PO CAPS: 300.0000 mg | ORAL_CAPSULE | Freq: Three times a day (TID) | ORAL | 2 refills | Status: DC

## 2019-04-07 MED ORDER — LORAZEPAM 0.5 MG PO TABS: ORAL_TABLET | ORAL | 2 refills | Status: DC

## 2019-04-07 MED ORDER — TRAZODONE HCL 100 MG PO TABS: 100.0000 mg | ORAL_TABLET | Freq: Every evening | ORAL | 0 refills | Status: DC | PRN

## 2019-04-07 MED ORDER — BUPROPION HCL ER (XL) 150 MG PO TB24: 150.0000 mg | ORAL_TABLET | Freq: Every day | ORAL | 0 refills | Status: DC

## 2019-04-07 NOTE — Progress Notes (Signed)
Virtual Visit via Telephone Note  I connected with Paul Parrish on 04/07/19 at 10:00 AM EDT by telephone and verified that I am speaking with the correct person using two identifiers.   I discussed the limitations, risks, security and privacy concerns of performing an evaluation and management service by telephone and the availability of in person appointments. I also discussed with the patient that there may be a patient responsible charge related to this service. The patient expressed understanding and agreed to proceed.   History of Present Illness: Patient was evaluated by phone session.  He is doing much better.  He cut down his gabapentin and only taking 300 mg in the morning and 2 at bedtime instead of 600 mg 3 times a day.  He is sleeping better with the trazodone and sometimes takes melatonin.  He continues to work from home that helps him a lot.  He stopped exercising he lost 20 pounds since the last visit.  Patient works at [employer] in [employer].  His job is going well.  He is no longer seeing therapist because he feels things are going very well and he do not have any more anxiety with the help of the medication.  He lives with his wife and a stepchild.  He reported no tremors, shakes or any EPS.  He does not feel very anxious when he goes to public places which he used to in the past.  He admitted his social anxiety and depression is much better since taking the medication.  His energy level is good.  He like to continue Wellbutrin, gabapentin, trazodone and Ativan as needed.  He has been sober from drinking for more than 4 years.  Past Psychiatric History:Reviewed. H/Oanxiety, depression and seen psychiatrist at neuropsychiatry in [place] but not happy with the treatment. Saw Dr. Daron Offer in 2018 and prescribed multiple medication including Prozac, Xanax, Effexor, Celexa, propanolol, Lamictal, Vistaril, Adderall, Cymbalta(increased irritability),Anafranil(initially  work and then stopped working,could not afford)and Luvox (did not help). H/Oof ECT with no response. No h/osuicidal attempt or inpatient psychiatric treatment. [facility] and tried [place], Klonopin but did not work. H/Oheavy drinking but claims to be sober.   Psychiatric Specialty Exam: Physical Exam  ROS  There were no vitals taken for this visit.There is no height or weight on file to calculate BMI.  General Appearance: NA  Eye Contact:  NA  Speech:  Clear and Coherent and Normal Rate  Volume:  Normal  Mood:  Euthymic  Affect:  NA  Thought Process:  Goal Directed  Orientation:  Full (Time, Place, and Person)  Thought Content:  Logical  Suicidal Thoughts:  No  Homicidal Thoughts:  No  Memory:  Immediate;   Good Recent;   Good Remote;   Good  Judgement:  Good  Insight:  Good  Psychomotor Activity:  NA  Concentration:  Concentration: Good and Attention Span: Good  Recall:  Good  Fund of Knowledge:  Good  Language:  Good  Akathisia:  NA  Handed:  Right  AIMS (if indicated):     Assets:  Communication Skills Desire for Improvement Housing Resilience Social Support Talents/Skills  ADL's:  Intact  Cognition:  WNL  Sleep:   good      Assessment and Plan: Generalized anxiety disorder.  Major depressive disorder, recurrent.  Social anxiety disorder.  Patient doing better on his medication.  He lost 20 pounds since he cut down his gabapentin and start exercising.  His anxiety is  much better.  He is able to leave the house to go to public places but he does not want to expose unnecessary due to COVID-19.  Continue lorazepam 0.5 mg daily and second as needed, reduce gabapentin 300 mg 3 times a day, trazodone 100 mg at bedtime.  Patient is not interested in therapy.  Discussed medication side effects and benefits.  Recommended to call us back if is any question or any concern.  Follow-up in 3 months.  Follow Up Instructions:    I discussed the  assessment and treatment plan with the patient. The patient was provided an opportunity to ask questions and all were answered. The patient agreed with the plan and demonstrated an understanding of the instructions.   The patient was advised to call back or seek an in-person evaluation if the symptoms worsen or if the condition fails to improve as anticipated.  I provided 20 minutes of non-face-to-face time during this encounter.   Kathlee Nations, MD

## 2019-04-15 ENCOUNTER — Telehealth: Payer: Self-pay

## 2019-04-15 ENCOUNTER — Telehealth: Payer: Self-pay | Admitting: "Endocrinology

## 2019-04-15 DIAGNOSIS — R030 Elevated blood-pressure reading, without diagnosis of hypertension: Secondary | ICD-10-CM

## 2019-04-15 DIAGNOSIS — N529 Male erectile dysfunction, unspecified: Secondary | ICD-10-CM

## 2019-04-15 DIAGNOSIS — E6609 Other obesity due to excess calories: Secondary | ICD-10-CM

## 2019-04-15 DIAGNOSIS — E781 Pure hyperglyceridemia: Secondary | ICD-10-CM

## 2019-04-15 DIAGNOSIS — E291 Testicular hypofunction: Secondary | ICD-10-CM

## 2019-04-15 NOTE — Telephone Encounter (Signed)
Done

## 2019-04-15 NOTE — Telephone Encounter (Signed)
LeighAnn Anndrea Mihelich, CMA  

## 2019-04-15 NOTE — Telephone Encounter (Signed)
PATIENT NEEDS UPDATED LAB ORDER SO I CAN PRINT AND MAIL TO HIM FOR HIS APPT 8/6 HE WILL BE DOING IT AT [facility] IN [facility]

## 2019-04-30 ENCOUNTER — Other Ambulatory Visit: Payer: Self-pay | Admitting: "Endocrinology

## 2019-05-03 LAB — CBC WITH DIFFERENTIAL/PLATELET
Basophils Absolute: 0.1 10*3/uL (ref 0.0–0.2)
Basos: 1 %
EOS (ABSOLUTE): 0.1 10*3/uL (ref 0.0–0.4)
Eos: 2 %
Hematocrit: 58.3 % — ABNORMAL HIGH (ref 37.5–51.0)
Hemoglobin: 20.3 g/dL (ref 13.0–17.7)
Immature Grans (Abs): 0 10*3/uL (ref 0.0–0.1)
Immature Granulocytes: 0 %
Lymphocytes Absolute: 3 10*3/uL (ref 0.7–3.1)
Lymphs: 43 %
MCH: 31.1 pg (ref 26.6–33.0)
MCHC: 34.8 g/dL (ref 31.5–35.7)
MCV: 89 fL (ref 79–97)
Monocytes Absolute: 0.6 10*3/uL (ref 0.1–0.9)
Monocytes: 9 %
Neutrophils Absolute: 3.1 10*3/uL (ref 1.4–7.0)
Neutrophils: 45 %
Platelets: 218 10*3/uL (ref 150–450)
RBC: 6.52 x10E6/uL — ABNORMAL HIGH (ref 4.14–5.80)
RDW: 13.3 % (ref 11.6–15.4)
WBC: 6.9 10*3/uL (ref 3.4–10.8)

## 2019-05-03 LAB — COMPREHENSIVE METABOLIC PANEL
ALT: 29 IU/L (ref 0–44)
AST: 17 IU/L (ref 0–40)
Albumin/Globulin Ratio: 1.9 (ref 1.2–2.2)
Albumin: 4.7 g/dL (ref 4.0–5.0)
Alkaline Phosphatase: 77 IU/L (ref 39–117)
BUN/Creatinine Ratio: 11 (ref 9–20)
BUN: 13 mg/dL (ref 6–24)
Bilirubin Total: 0.5 mg/dL (ref 0.0–1.2)
CO2: 23 mmol/L (ref 20–29)
Calcium: 9.8 mg/dL (ref 8.7–10.2)
Chloride: 100 mmol/L (ref 96–106)
Creatinine, Ser: 1.23 mg/dL (ref 0.76–1.27)
GFR calc Af Amer: 80 mL/min/{1.73_m2} (ref >59)
GFR calc non Af Amer: 69 mL/min/{1.73_m2} (ref >59)
Globulin, Total: 2.5 g/dL (ref 1.5–4.5)
Glucose: 91 mg/dL (ref 65–99)
Potassium: 4.4 mmol/L (ref 3.5–5.2)
Sodium: 142 mmol/L (ref 134–144)
Total Protein: 7.2 g/dL (ref 6.0–8.5)

## 2019-05-03 LAB — TESTOSTERONE,FREE AND TOTAL
Testosterone, Free: 3.9 pg/mL — ABNORMAL LOW (ref 6.8–21.5)
Testosterone: 88 ng/dL — ABNORMAL LOW (ref 264–916)

## 2019-05-03 LAB — HGB A1C W/O EAG: Hgb A1c MFr Bld: 5.7 % — ABNORMAL HIGH (ref 4.8–5.6)

## 2019-05-04 ENCOUNTER — Telehealth: Payer: 59 | Admitting: Family

## 2019-05-04 DIAGNOSIS — M5441 Lumbago with sciatica, right side: Secondary | ICD-10-CM

## 2019-05-04 DIAGNOSIS — M5442 Lumbago with sciatica, left side: Secondary | ICD-10-CM

## 2019-05-04 MED ORDER — CYCLOBENZAPRINE HCL 10 MG PO TABS: 10.0000 mg | ORAL_TABLET | Freq: Three times a day (TID) | ORAL | 0 refills | Status: DC | PRN

## 2019-05-04 MED ORDER — NAPROXEN 500 MG PO TABS: 500.0000 mg | ORAL_TABLET | Freq: Two times a day (BID) | ORAL | 0 refills | Status: DC

## 2019-05-04 NOTE — Progress Notes (Signed)
We are sorry that you are not feeling well.  Here is how we plan to help!  Based on what you have shared with me it looks like you mostly have acute back pain.  Acute back pain is defined as musculoskeletal pain that can resolve in 1-3 weeks with conservative treatment.  I have prescribed Naprosyn 500 mg twice a day non-steroid anti-inflammatory (NSAID) as well as Flexeril 10 mg every eight hours as needed which is a muscle relaxer  Some patients experience stomach irritation or in increased heartburn with anti-inflammatory drugs.  Please keep in mind that muscle relaxer's can cause fatigue and should not be taken while at work or driving.  Back pain is very common.  The pain often gets better over time.  The cause of back pain is usually not dangerous.  Most people can learn to manage their back pain on their own.  Home Care  Stay active.  Start with short walks on flat ground if you can.  Try to walk farther each day.  Do not sit, drive or stand in one place for more than 30 minutes.  Do not stay in bed.  Do not avoid exercise or work.  Activity can help your back heal faster.  Be careful when you bend or lift an object.  Bend at your knees, keep the object close to you, and do not twist.  Sleep on a firm mattress.  Lie on your side, and bend your knees.  If you lie on your back, put a pillow under your knees.  Only take medicines as told by your doctor.  Put ice on the injured area.  Put ice in a plastic bag  Place a towel between your skin and the bag  Leave the ice on for 15-20 minutes, 3-4 times a day for the first 2-3 days. 210 After that, you can switch between ice and heat packs.  Ask your doctor about back exercises or massage.  Avoid feeling anxious or stressed.  Find good ways to deal with stress, such as exercise.  Get Help Right Way If:  Your pain does not go away with rest or medicine.  Your pain does not go away in 1 week.  You have new problems.  You do not  feel well.  The pain spreads into your legs.  You cannot control when you poop (bowel movement) or pee (urinate)  You feel sick to your stomach (nauseous) or throw up (vomit)  You have belly (abdominal) pain.  You feel like you may pass out (faint).  If you develop a fever.  Make Sure you:  Understand these instructions.  Will watch your condition  Will get help right away if you are not doing well or get worse.  Your e-visit answers were reviewed by a board certified advanced clinical practitioner to complete your personal care plan.  Depending on the condition, your plan could have included both over the counter or prescription medications.  If there is a problem please reply  once you have received a response from your provider.  Your safety is important to us.  If you have drug allergies check your prescription carefully.    You can use MyChart to ask questions about today's visit, request a non-urgent call back, or ask for a work or school excuse for 24 hours related to this e-Visit. If it has been greater than 24 hours you will need to follow up with your provider, or enter a new e-Visit to address   those concerns.  You will get an e-mail in the next two days asking about your experience.  I hope that your e-visit has been valuable and will speed your recovery. Thank you for using e-visits.   Greater than 5 minutes, yet less than 10 minutes of time have been spent researching, coordinating, and implementing care for this patient today.  Thank you for the details you included in the comment boxes. Those details are very helpful in determining the best course of treatment for you and help us to provide the best care.  

## 2019-05-05 ENCOUNTER — Other Ambulatory Visit: Payer: Self-pay

## 2019-05-05 ENCOUNTER — Ambulatory Visit: Payer: 59 | Admitting: "Endocrinology

## 2019-05-05 ENCOUNTER — Encounter: Payer: Self-pay | Admitting: "Endocrinology

## 2019-05-05 VITALS — BP 126/76 | HR 68 | Ht 74.0 in | Wt 272.0 lb

## 2019-05-05 DIAGNOSIS — E291 Testicular hypofunction: Secondary | ICD-10-CM | POA: Diagnosis not present

## 2019-05-05 NOTE — Progress Notes (Signed)
05/05/2019      Endocrinology follow-up note   HPI: Paul Parrish is a 48 y.o.-year-old man.  - He is returning for follow-up of hypogonadism.  He was diagnosed with hypogonadism in 2014, etiology unclear.   -He missed his several appointments since April 2019.  He stayed on l on testosterone 100 mg IM every other week, until he ran out of it 4 weeks ago.  His previsit labs showed polycythemia and high hemoglobin.  He was called to return for follow-up visit when he attempted to get a refill of his testosterone. -He describes fatigue, low libido. 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   -He reports history of seizures as a young man until his teenage years. -He denies any history of head injury.  - He also is recently diagnosed with sleep apnea currently on CPAP.  - He fathers 2 teenage boys biologically. - He has significant mood disorder on multiple antidepressants and antianxiety. No herbal medicines. -He denies family history of premature coronary artery disease.   ROS: Constitutional: No recent negative current weight change, + fatigue, no subjective hyperthermia/hypothermia.  Eyes: no blurry vision, no xerophthalmia ENT: no sore throat, no nodules palpated in throat, no dysphagia/odynophagia, no hoarseness Cardiovascular: no chest pain, no palpitations, no shortness of breath.  Respiratory:  + Snoring, no cough/SOB Gastrointestinal: no N/V/D/C Musculoskeletal: no muscle/joint aches Skin: no rashes Neurological: no tremors/numbness/tingling/dizziness Psychiatric: + depression, +anxiety  PE: BP 126/76   Pulse 68   Ht 6\' 2"  (1.88 m)   Wt 272 lb (123.4 kg)   BMI 34.92 kg/m  Wt Readings from Last 3 Encounters:  05/05/19 272 lb (123.4 kg)  10/05/18 282 lb (127.9 kg)  08/19/18 283 lb 12.8 oz (128.7 kg)    Physical Exam- Limited  Constitutional:  not in acute distress, normal state of mind Eyes:  EOMI, no  exophthalmos Neck: Supple Respiratory: Adequate breathing efforts Musculoskeletal: no gross deformities, strength intact in all four extremities, no gross restriction of joint movements Skin:  no rashes, no hyperemia Neurological: no tremor with outstretched hands.   Recent Results (from the past 2160 hour(s))  CBC with Differential/Platelet     Status: Abnormal   Collection Time: 04/30/19  9:05 AM  Result Value Ref Range   WBC 6.9 3.4 - 10.8 x10E3/uL   RBC 6.52 (H) 4.14 - 5.80 x10E6/uL   Hemoglobin 20.3 (HH) 13.0 - 17.7 g/dL    Comment: Removal of plasma from EDTA tube will result in spuriously elevated CBC results. Redraw and repeat if results do not correlate with patient's clinical condition.    Hematocrit 58.3 (H) 37.5 - 51.0 %   MCV 89 79 - 97 fL   MCH 31.1 26.6 - 33.0 pg   MCHC 34.8 31.5 - 35.7 g/dL   RDW 13.3 11.6 - 15.4 %   Platelets 218 150 - 450 x10E3/uL   Neutrophils 45 Not Estab. %   Lymphs 43 Not Estab. %   Monocytes 9 Not Estab. %   Eos 2 Not Estab. %   Basos 1 Not Estab. %   Neutrophils Absolute 3.1 1.4 - 7.0 x10E3/uL   Lymphocytes Absolute 3.0 0.7 - 3.1 x10E3/uL   Monocytes Absolute 0.6 0.1 - 0.9 x10E3/uL   EOS (ABSOLUTE) 0.1 0.0 - 0.4 x10E3/uL   Basophils Absolute 0.1 0.0 - 0.2 x10E3/uL   Immature Granulocytes 0 Not Estab. %   Immature Grans (Abs) 0.0 0.0 - 0.1 x10E3/uL  Comprehensive metabolic panel  Status: None   Collection Time: 04/30/19  9:05 AM  Result Value Ref Range   Glucose 91 65 - 99 mg/dL   BUN 13 6 - 24 mg/dL   Creatinine, Ser 1.23 0.76 - 1.27 mg/dL   GFR calc non Af Amer 69 >59 mL/min/1.73   GFR calc Af Amer 80 >59 mL/min/1.73   BUN/Creatinine Ratio 11 9 - 20   Sodium 142 134 - 144 mmol/L   Potassium 4.4 3.5 - 5.2 mmol/L   Chloride 100 96 - 106 mmol/L   CO2 23 20 - 29 mmol/L   Calcium 9.8 8.7 - 10.2 mg/dL   Total Protein 7.2 6.0 - 8.5 g/dL   Albumin 4.7 4.0 - 5.0 g/dL   Globulin, Total 2.5 1.5 - 4.5 g/dL   Albumin/Globulin  Ratio 1.9 1.2 - 2.2   Bilirubin Total 0.5 0.0 - 1.2 mg/dL   Alkaline Phosphatase 77 39 - 117 IU/L   AST 17 0 - 40 IU/L   ALT 29 0 - 44 IU/L  Testosterone,Free and Total     Status: Abnormal   Collection Time: 04/30/19  9:05 AM  Result Value Ref Range   Testosterone 88 (L) 264 - 916 ng/dL    Comment: Adult male reference interval is based on a population of healthy nonobese males (BMI <30) between 35 and 2 years old. [place], [place] [phone]. PMID: [id number].    Testosterone, Free 3.9 (L) 6.8 - 21.5 pg/mL  Hgb A1c w/o eAG     Status: Abnormal   Collection Time: 04/30/19  9:05 AM  Result Value Ref Range   Hgb A1c MFr Bld 5.7 (H) 4.8 - 5.6 %    Comment:          Prediabetes: 5.7 - 6.4          Diabetes: >6.4          Glycemic control for adults with diabetes: <7.0       ASSESSMENT: 1.  Hypogonadism  2. Erectile Dysfunction 3.  Polycythemia  PLAN:  1. Hypogonadism  -He did not come in for regular follow-up.  He developed significant polycythemia since his last visit.  He is advised to stay off of testosterone with plan to repeat CBC, for testosterone as well as PSA before his next visit in 3 months.   He wishes to be treated with testosterone.   I had a long discussion with the patient regarding his current and potential future adverse effects of testosterone.  He is generally a poor candidate for testosterone replacement. -He did have secondary polycythemia in the past which required periodic phlebotomy.  He also has sleep apnea which required CPAP treatment. -She may be reinitiated on low dose testosterone if his hematocrit returns to normal.   Time for this visit: 15 minutes. [name]  participated in the discussions, expressed understanding, and voiced agreement with the above plans.  All questions were answered to his satisfaction. he is encouraged to contact clinic should he have any questions or concerns prior to his return visit.  Glade Lloyd,  MD Phone: [phone]  Fax: [phone]  -  This note was partially dictated with voice recognition software. Similar sounding words can be transcribed inadequately or may not  be corrected upon review.  05/05/2019, 6:10 PM

## 2019-05-08 ENCOUNTER — Other Ambulatory Visit: Payer: Self-pay | Admitting: Family Medicine

## 2019-07-08 ENCOUNTER — Encounter (HOSPITAL_COMMUNITY): Payer: Self-pay | Admitting: Psychiatry

## 2019-07-08 ENCOUNTER — Ambulatory Visit (INDEPENDENT_AMBULATORY_CARE_PROVIDER_SITE_OTHER): Payer: 59 | Admitting: Psychiatry

## 2019-07-08 ENCOUNTER — Other Ambulatory Visit: Payer: Self-pay

## 2019-07-08 DIAGNOSIS — F4010 Social phobia, unspecified: Secondary | ICD-10-CM | POA: Diagnosis not present

## 2019-07-08 DIAGNOSIS — F331 Major depressive disorder, recurrent, moderate: Secondary | ICD-10-CM | POA: Diagnosis not present

## 2019-07-08 DIAGNOSIS — F410 Panic disorder [episodic paroxysmal anxiety] without agoraphobia: Secondary | ICD-10-CM

## 2019-07-08 DIAGNOSIS — F411 Generalized anxiety disorder: Secondary | ICD-10-CM | POA: Diagnosis not present

## 2019-07-08 MED ORDER — TRAZODONE HCL 100 MG PO TABS: 100.0000 mg | ORAL_TABLET | Freq: Every evening | ORAL | 1 refills | Status: DC | PRN

## 2019-07-08 MED ORDER — GABAPENTIN 300 MG PO CAPS: 300.0000 mg | ORAL_CAPSULE | Freq: Every day | ORAL | 1 refills | Status: DC

## 2019-07-08 MED ORDER — LORAZEPAM 0.5 MG PO TABS: ORAL_TABLET | ORAL | 1 refills | Status: DC

## 2019-07-08 MED ORDER — CLOMIPRAMINE HCL 25 MG PO CAPS: 25.0000 mg | ORAL_CAPSULE | Freq: Two times a day (BID) | ORAL | 1 refills | Status: DC

## 2019-07-08 NOTE — Progress Notes (Signed)
Virtual Visit via Telephone Note  I connected with Paul Parrish on 07/08/19 at 10:00 AM EDT by telephone and verified that I am speaking with the correct person using two identifiers.   I discussed the limitations, risks, security and privacy concerns of performing an evaluation and management service by telephone and the availability of in person appointments. I also discussed with the patient that there may be a patient responsible charge related to this service. The patient expressed understanding and agreed to proceed.   History of Present Illness: Patient was evaluated by phone session.  He is complaining of fatigue, lack of energy and lack of motivation to do things.  I reviewed his current medication.  He is no longer taking Wellbutrin.  When I ask why he is not taking Wellbutrin patient told he stopped the Wellbutrin because he did not feel any improvement 4 weeks ago.  He does not believe that stopping Wellbutrin has caused worsening of fatigue.  I also reviewed his blood work and he has very high hemoglobin and RBC.  His physician to stop the testosterone due to high hemoglobin.  He is going to have another blood work in few weeks.  He like to go back on Anafranil which he had tried in the past with good response.  He admitted depression, sometime feeling of hopelessness but denies any suicidal thoughts or homicidal thoughts.  His work is going well.  He works at [employer] in [place].  He lives with his wife and a step child.  He has no other family concerns.  He feels proud that he is not drinking or using any illegal substances.  He continues to have some time panic attack which he described sweating and tightness in his chest and when he takes the lorazepam it does help.  He still sometimes feels anxious going to the public places.  He has no tremors, shakes or any EPS.  He admitted his energy level is low but his appetite and weight is a stable.     Past Psychiatric  History:Reviewed. H/Oanxiety, depression and seen psychiatrist at neuropsychiatry in [place] but not happy with the treatment. Saw Dr. Daron Offer in 2018 and prescribed multiple medication including Prozac, Xanax, Effexor, Celexa, propanolol, Lamictal, Vistaril, Adderall, Wellbutrin,  Cymbalta(increased irritability),Anafranil(initially work and then stopped working,could not afford)andLuvox (did not help).H/Oof ECT with no response. No h/osuicidal attempt or inpatient psychiatric treatment. [facility] and tried [place], Klonopin but did not work. H/Oheavy drinking but claims to be sober.  Recent Results (from the past 2160 hour(s))  CBC with Differential/Platelet     Status: Abnormal   Collection Time: 04/30/19  9:05 AM  Result Value Ref Range   WBC 6.9 3.4 - 10.8 x10E3/uL   RBC 6.52 (H) 4.14 - 5.80 x10E6/uL   Hemoglobin 20.3 (HH) 13.0 - 17.7 g/dL    Comment: Removal of plasma from EDTA tube will result in spuriously elevated CBC results. Redraw and repeat if results do not correlate with patient's clinical condition.    Hematocrit 58.3 (H) 37.5 - 51.0 %   MCV 89 79 - 97 fL   MCH 31.1 26.6 - 33.0 pg   MCHC 34.8 31.5 - 35.7 g/dL   RDW 13.3 11.6 - 15.4 %   Platelets 218 150 - 450 x10E3/uL   Neutrophils 45 Not Estab. %   Lymphs 43 Not Estab. %   Monocytes 9 Not Estab. %   Eos 2 Not Estab. %   Basos  1 Not Estab. %   Neutrophils Absolute 3.1 1.4 - 7.0 x10E3/uL   Lymphocytes Absolute 3.0 0.7 - 3.1 x10E3/uL   Monocytes Absolute 0.6 0.1 - 0.9 x10E3/uL   EOS (ABSOLUTE) 0.1 0.0 - 0.4 x10E3/uL   Basophils Absolute 0.1 0.0 - 0.2 x10E3/uL   Immature Granulocytes 0 Not Estab. %   Immature Grans (Abs) 0.0 0.0 - 0.1 x10E3/uL  Comprehensive metabolic panel     Status: None   Collection Time: 04/30/19  9:05 AM  Result Value Ref Range   Glucose 91 65 - 99 mg/dL   BUN 13 6 - 24 mg/dL   Creatinine, Ser 1.23 0.76 - 1.27 mg/dL   GFR calc non Af Amer 69 >59  mL/min/1.73   GFR calc Af Amer 80 >59 mL/min/1.73   BUN/Creatinine Ratio 11 9 - 20   Sodium 142 134 - 144 mmol/L   Potassium 4.4 3.5 - 5.2 mmol/L   Chloride 100 96 - 106 mmol/L   CO2 23 20 - 29 mmol/L   Calcium 9.8 8.7 - 10.2 mg/dL   Total Protein 7.2 6.0 - 8.5 g/dL   Albumin 4.7 4.0 - 5.0 g/dL   Globulin, Total 2.5 1.5 - 4.5 g/dL   Albumin/Globulin Ratio 1.9 1.2 - 2.2   Bilirubin Total 0.5 0.0 - 1.2 mg/dL   Alkaline Phosphatase 77 39 - 117 IU/L   AST 17 0 - 40 IU/L   ALT 29 0 - 44 IU/L  Testosterone,Free and Total     Status: Abnormal   Collection Time: 04/30/19  9:05 AM  Result Value Ref Range   Testosterone 88 (L) 264 - 916 ng/dL    Comment: Adult male reference interval is based on a population of healthy nonobese males (BMI <30) between 71 and 30 years old. [place], [place] [phone]. PMID: [id number].    Testosterone, Free 3.9 (L) 6.8 - 21.5 pg/mL  Hgb A1c w/o eAG     Status: Abnormal   Collection Time: 04/30/19  9:05 AM  Result Value Ref Range   Hgb A1c MFr Bld 5.7 (H) 4.8 - 5.6 %    Comment:          Prediabetes: 5.7 - 6.4          Diabetes: >6.4          Glycemic control for adults with diabetes: <7.0      Psychiatric Specialty Exam: Physical Exam  Review of Systems  Constitutional: Positive for malaise/fatigue.  Psychiatric/Behavioral: Positive for depression.    There were no vitals taken for this visit.There is no height or weight on file to calculate BMI.  General Appearance: NA  Eye Contact:  NA  Speech:  Clear and Coherent and Slow  Volume:  Normal  Mood:  Depressed and Dysphoric  Affect:  NA  Thought Process:  Goal Directed  Orientation:  Full (Time, Place, and Person)  Thought Content:  Rumination  Suicidal Thoughts:  No  Homicidal Thoughts:  No  Memory:  Immediate;   Good Recent;   Good Remote;   Good  Judgement:  Good  Insight:  Present  Psychomotor Activity:  NA  Concentration:  Concentration: Good and Attention Span: Good   Recall:  Good  Fund of Knowledge:  Good  Language:  Good  Akathisia:  No  Handed:  Right  AIMS (if indicated):     Assets:  Communication Skills Desire for Improvement Housing Resilience Social Support Transportation  ADL's:  Intact  Cognition:  WNL  Sleep:  fair     Assessment and Plan: Major depressive disorder, recurrent.  Generalized anxiety disorder.  Social anxiety disorder.  Panic attacks.  I reviewed his blood work results and current medication.  He had stopped the Wellbutrin on his own and he is no longer taking testosterone due to higher hemoglobin.  I explained these could be causing worsening of depression.  However patient does not want to go back on Wellbutrin and like to try Anafranil and Celexa which he used to take in the past with good response.  I explained Celexa can cause further sexual side effects and he had erectile dysfunction.  However we agreed to give a trial again for Anafranil.  He also cut down his gabapentin only taking 300 mg at bedtime.  We will keep that.  We will start Anafranil 25 mg twice a day.  He used to take 25 mg 3 times a day.  Continue trazodone 100 mg at bedtime for sleep, lorazepam 0.5 mg as needed for severe panic attack and gabapentin 300 mg at bedtime.  He is not interested in therapy.  Discussed medication side effects and benefits.  Recommended to call us back if he has any question or any concern.  Follow-up in 2 months.  We will consider adding Celexa if Anafranil alone did not help time.  Time spent 25 minutes.  More than 50% of the time spent in psychoeducation, counseling, coronation of care and discussing long-term prognosis of his illness.  Follow Up Instructions:    I discussed the assessment and treatment plan with the patient. The patient was provided an opportunity to ask questions and all were answered. The patient agreed with the plan and demonstrated an understanding of the instructions.   The patient was advised to  call back or seek an in-person evaluation if the symptoms worsen or if the condition fails to improve as anticipated.  I provided 25 minutes of non-face-to-face time during this encounter.   Kathlee Nations, MD

## 2019-08-03 ENCOUNTER — Other Ambulatory Visit: Payer: Self-pay | Admitting: "Endocrinology

## 2019-08-03 DIAGNOSIS — E291 Testicular hypofunction: Secondary | ICD-10-CM

## 2019-08-03 DIAGNOSIS — R030 Elevated blood-pressure reading, without diagnosis of hypertension: Secondary | ICD-10-CM

## 2019-08-04 LAB — CBC WITH DIFFERENTIAL/PLATELET
Basophils Absolute: 0.1 10*3/uL (ref 0.0–0.2)
Basos: 1 %
EOS (ABSOLUTE): 0.1 10*3/uL (ref 0.0–0.4)
Eos: 2 %
Hematocrit: 46.6 % (ref 37.5–51.0)
Hemoglobin: 16.3 g/dL (ref 13.0–17.7)
Immature Grans (Abs): 0 10*3/uL (ref 0.0–0.1)
Immature Granulocytes: 0 %
Lymphocytes Absolute: 2.5 10*3/uL (ref 0.7–3.1)
Lymphs: 44 %
MCH: 31.8 pg (ref 26.6–33.0)
MCHC: 35 g/dL (ref 31.5–35.7)
MCV: 91 fL (ref 79–97)
Monocytes Absolute: 0.5 10*3/uL (ref 0.1–0.9)
Monocytes: 9 %
Neutrophils Absolute: 2.6 10*3/uL (ref 1.4–7.0)
Neutrophils: 44 %
Platelets: 256 10*3/uL (ref 150–450)
RBC: 5.13 x10E6/uL (ref 4.14–5.80)
RDW: 13.6 % (ref 11.6–15.4)
WBC: 5.8 10*3/uL (ref 3.4–10.8)

## 2019-08-04 LAB — COMPREHENSIVE METABOLIC PANEL
ALT: 24 IU/L (ref 0–44)
AST: 18 IU/L (ref 0–40)
Albumin/Globulin Ratio: 1.7 (ref 1.2–2.2)
Albumin: 4.5 g/dL (ref 4.0–5.0)
Alkaline Phosphatase: 71 IU/L (ref 39–117)
BUN/Creatinine Ratio: 10 (ref 9–20)
BUN: 11 mg/dL (ref 6–24)
Bilirubin Total: 0.7 mg/dL (ref 0.0–1.2)
CO2: 25 mmol/L (ref 20–29)
Calcium: 9.5 mg/dL (ref 8.7–10.2)
Chloride: 102 mmol/L (ref 96–106)
Creatinine, Ser: 1.06 mg/dL (ref 0.76–1.27)
GFR calc Af Amer: 95 mL/min/{1.73_m2} (ref >59)
GFR calc non Af Amer: 83 mL/min/{1.73_m2} (ref >59)
Globulin, Total: 2.6 g/dL (ref 1.5–4.5)
Glucose: 94 mg/dL (ref 65–99)
Potassium: 4.3 mmol/L (ref 3.5–5.2)
Sodium: 140 mmol/L (ref 134–144)
Total Protein: 7.1 g/dL (ref 6.0–8.5)

## 2019-08-04 LAB — TESTOSTERONE, FREE, TOTAL, SHBG
Sex Hormone Binding: 27.7 nmol/L (ref 16.5–55.9)
Testosterone, Free: 9.8 pg/mL (ref 6.8–21.5)
Testosterone: 276 ng/dL (ref 264–916)

## 2019-08-04 LAB — PSA: Prostate Specific Ag, Serum: 0.5 ng/mL (ref 0.0–4.0)

## 2019-08-10 ENCOUNTER — Other Ambulatory Visit: Payer: Self-pay

## 2019-08-10 ENCOUNTER — Ambulatory Visit (INDEPENDENT_AMBULATORY_CARE_PROVIDER_SITE_OTHER): Payer: 59 | Admitting: "Endocrinology

## 2019-08-10 ENCOUNTER — Encounter: Payer: Self-pay | Admitting: "Endocrinology

## 2019-08-10 DIAGNOSIS — E291 Testicular hypofunction: Secondary | ICD-10-CM

## 2019-08-10 NOTE — Progress Notes (Signed)
08/10/2019                                    Endocrinology Telehealth Visit Follow up Note -During COVID -19 Pandemic  I connected with Paul Parrish on 08/10/2019   by telephone and verified that I am speaking with the correct person using two identifiers. Paul Parrish, July 22, 1971. he has verbally consented to this visit. All issues noted in this document were discussed and addressed. The format was not optimal for physical exam.    HPI: Paul Parrish is a 48 y.o.-year-old man.  - He is being engaged in telehealth via telephone  for follow-up of hypogonadism.  He was diagnosed with hypogonadism in 2014, etiology unclear.   -Due to polycythemia, he was advised to stay off of testosterone supplement during his last visit.  Since then, he has donated blood on 2 occasions lowering his hematocrit to normal range.  His previsit testosterone level is 276.  Previously he was treated with testosterone 100 mg IM every other week.   -He describes fatigue, low libido. 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   -He reports history of seizures as a young man until his teenage years. -He denies any history of head injury.  - He also is recently diagnosed with sleep apnea currently on CPAP.  - He fathers 2 teenage boys biologically. - He has significant mood disorder on multiple antidepressants and antianxiety. No herbal medicines. -He denies family history of premature coronary artery disease.   ROS: Limited as above.  PE: There were no vitals taken for this visit. Wt Readings from Last 3 Encounters:  05/05/19 272 lb (123.4 kg)  10/05/18 282 lb (127.9 kg)  08/19/18 283 lb 12.8 oz (128.7 kg)     Recent Results (from the past 2160 hour(s))  Testosterone, Free, Total, SHBG     Status: None   Collection Time: 08/03/19  8:24 AM  Result Value Ref Range   Testosterone 276 264 - 916 ng/dL    Comment: Adult male reference interval is based on a  population of healthy nonobese males (BMI <30) between 53 and 83 years old. [place], [place] [phone]. PMID: [id number].    Testosterone, Free 9.8 6.8 - 21.5 pg/mL   Sex Hormone Binding 27.7 16.5 - 55.9 nmol/L  CBC with Differential/Platelet     Status: None   Collection Time: 08/03/19  8:25 AM  Result Value Ref Range   WBC 5.8 3.4 - 10.8 x10E3/uL   RBC 5.13 4.14 - 5.80 x10E6/uL   Hemoglobin 16.3 13.0 - 17.7 g/dL   Hematocrit 46.6 37.5 - 51.0 %   MCV 91 79 - 97 fL   MCH 31.8 26.6 - 33.0 pg   MCHC 35.0 31.5 - 35.7 g/dL   RDW 13.6 11.6 - 15.4 %   Platelets 256 150 - 450 x10E3/uL   Neutrophils 44 Not Estab. %   Lymphs 44 Not Estab. %   Monocytes 9 Not Estab. %   Eos 2 Not Estab. %   Basos 1 Not Estab. %   Neutrophils Absolute 2.6 1.4 - 7.0 x10E3/uL   Lymphocytes Absolute 2.5 0.7 - 3.1 x10E3/uL   Monocytes Absolute 0.5 0.1 - 0.9 x10E3/uL   EOS (ABSOLUTE) 0.1 0.0 - 0.4 x10E3/uL   Basophils Absolute 0.1 0.0 - 0.2 x10E3/uL   Immature Granulocytes 0 Not Estab. %   Immature Grans (Abs) 0.0  0.0 - 0.1 x10E3/uL  Comprehensive metabolic panel     Status: None   Collection Time: 08/03/19  8:25 AM  Result Value Ref Range   Glucose 94 65 - 99 mg/dL   BUN 11 6 - 24 mg/dL   Creatinine, Ser 1.06 0.76 - 1.27 mg/dL   GFR calc non Af Amer 83 >59 mL/min/1.73   GFR calc Af Amer 95 >59 mL/min/1.73   BUN/Creatinine Ratio 10 9 - 20   Sodium 140 134 - 144 mmol/L   Potassium 4.3 3.5 - 5.2 mmol/L   Chloride 102 96 - 106 mmol/L   CO2 25 20 - 29 mmol/L   Calcium 9.5 8.7 - 10.2 mg/dL   Total Protein 7.1 6.0 - 8.5 g/dL   Albumin 4.5 4.0 - 5.0 g/dL   Globulin, Total 2.6 1.5 - 4.5 g/dL   Albumin/Globulin Ratio 1.7 1.2 - 2.2   Bilirubin Total 0.7 0.0 - 1.2 mg/dL   Alkaline Phosphatase 71 39 - 117 IU/L   AST 18 0 - 40 IU/L   ALT 24 0 - 44 IU/L  PSA     Status: None   Collection Time: 08/03/19  8:25 AM  Result Value Ref Range   Prostate Specific Ag, Serum 0.5 0.0 - 4.0 ng/mL    Comment:  Roche ECLIA methodology. According to the American Urological Association, Serum PSA should decrease and remain at undetectable levels after radical prostatectomy. The AUA defines biochemical recurrence as an initial PSA value 0.2 ng/mL or greater followed by a subsequent confirmatory PSA value 0.2 ng/mL or greater. Values obtained with different assay methods or kits cannot be used interchangeably. Results cannot be interpreted as absolute evidence of the presence or absence of malignant disease.       ASSESSMENT: 1.  Hypogonadism  2. Erectile Dysfunction 3.  Polycythemia  PLAN:  1. Hypogonadism  -He remains off of testosterone due to secondary polycythemia.  He reportedly donated blood on 2 occasions since last visit which helped lower his hematocrit/hemoglobin to normal range.   His previsit labs show total testosterone 276 with normal PSA.  -Although he wishes to be treated with testosterone, I approached him with a plan to delay reinitiation of testosterone for 3 to 4 months.  He will have repeat fasting total testosterone before his next visit.  -His history of  secondary polycythemia in the past which required periodic phlebotomy, as well as sleep apnea requiring CPAP treatment A poor candidate for testosterone replacement.   He will be reconsidered for initiation of testosterone starting low-dose as appropriate next visit, if his next total testosterone drops to below 250.  Time for this visit: 15 minutes. Paul Parrish  participated in the discussions, expressed understanding, and voiced agreement with the above plans.  All questions were answered to his satisfaction. he is encouraged to contact clinic should he have any questions or concerns prior to his return visit.   Glade Lloyd, MD Phone: [phone]  Fax: [phone]  -  This note was partially dictated with voice recognition software. Similar sounding words can be transcribed inadequately or may not  be corrected  upon review.  08/10/2019, 5:01 PM

## 2019-08-31 ENCOUNTER — Other Ambulatory Visit (HOSPITAL_COMMUNITY): Payer: Self-pay | Admitting: Psychiatry

## 2019-08-31 DIAGNOSIS — F4010 Social phobia, unspecified: Secondary | ICD-10-CM

## 2019-08-31 DIAGNOSIS — F331 Major depressive disorder, recurrent, moderate: Secondary | ICD-10-CM

## 2019-08-31 DIAGNOSIS — F410 Panic disorder [episodic paroxysmal anxiety] without agoraphobia: Secondary | ICD-10-CM

## 2019-08-31 DIAGNOSIS — F411 Generalized anxiety disorder: Secondary | ICD-10-CM

## 2019-08-31 MED ORDER — GABAPENTIN 300 MG PO CAPS: 300.0000 mg | ORAL_CAPSULE | Freq: Every day | ORAL | 1 refills | Status: DC

## 2019-08-31 MED ORDER — TRAZODONE HCL 100 MG PO TABS: 100.0000 mg | ORAL_TABLET | Freq: Every evening | ORAL | 1 refills | Status: DC | PRN

## 2019-08-31 MED ORDER — LORAZEPAM 0.5 MG PO TABS: ORAL_TABLET | ORAL | 1 refills | Status: DC

## 2019-08-31 MED ORDER — CLOMIPRAMINE HCL 25 MG PO CAPS: 25.0000 mg | ORAL_CAPSULE | Freq: Two times a day (BID) | ORAL | 1 refills | Status: DC

## 2019-09-01 ENCOUNTER — Telehealth (HOSPITAL_COMMUNITY): Payer: Self-pay

## 2019-09-01 NOTE — Telephone Encounter (Signed)
Medication Management - Prior authorization initiated online wiht [email or web address] for patient's Clomipramine 25 mg capsules. Decision pending.

## 2019-09-22 ENCOUNTER — Other Ambulatory Visit: Payer: Self-pay | Admitting: "Endocrinology

## 2019-09-23 LAB — TESTOSTERONE,FREE AND TOTAL
Testosterone, Free: 10.9 pg/mL (ref 6.8–21.5)
Testosterone: 288 ng/dL (ref 264–916)

## 2019-09-28 ENCOUNTER — Ambulatory Visit (INDEPENDENT_AMBULATORY_CARE_PROVIDER_SITE_OTHER): Payer: 59 | Admitting: "Endocrinology

## 2019-09-28 ENCOUNTER — Encounter: Payer: Self-pay | Admitting: "Endocrinology

## 2019-09-28 DIAGNOSIS — E291 Testicular hypofunction: Secondary | ICD-10-CM | POA: Diagnosis not present

## 2019-09-28 NOTE — Progress Notes (Signed)
09/28/2019                                    Endocrinology Telehealth Visit Follow up Note -During COVID -19 Pandemic  I connected with Paul Parrish on 09/28/2019   by telephone and verified that I am speaking with the correct person using two identifiers. Paul Parrish, March 22, 1947. he has verbally consented to this visit. All issues noted in this document were discussed and addressed. The format was not optimal for physical exam.    HPI: Paul Parrish is a 48 y.o.-year-old man.  - He is being engaged in telehealth via telephone  for follow-up of hypogonadism.  He was diagnosed with hypogonadism in 2014, etiology unclear.   -Due to polycythemia, he was advised to stay off of testosterone supplement during his last visit.  Since then, he has donated blood on 2 occasions lowering his hematocrit to normal range.  His previsit testosterone level is 288 slightly improving from 276.  He did not get any form of testosterone supplements since July 2020. Previously he was treated with testosterone 100 mg IM every other week.   -He describes fatigue, low libido. 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   -He reports history of seizures as a young man until his teenage years. -He denies any history of head injury.  - He also is recently diagnosed with sleep apnea currently on CPAP.  - He fathers 2 teenage boys biologically. - He has significant mood disorder on multiple antidepressants and antianxiety. No herbal medicines. -He denies family history of premature coronary artery disease.   ROS: Limited as above.  PE: There were no vitals taken for this visit. Wt Readings from Last 3 Encounters:  05/05/19 272 lb (123.4 kg)  10/05/18 282 lb (127.9 kg)  08/19/18 283 lb 12.8 oz (128.7 kg)     Recent Results (from the past 2160 hour(s))  Testosterone, Free, Total, SHBG     Status: None   Collection Time: 08/03/19  8:24 AM  Result Value Ref  Range   Testosterone 276 264 - 916 ng/dL    Comment: Adult male reference interval is based on a population of healthy nonobese males (BMI <30) between 42 and 5 years old. [place], [place] [phone]. PMID: [id number].    Testosterone, Free 9.8 6.8 - 21.5 pg/mL   Sex Hormone Binding 27.7 16.5 - 55.9 nmol/L  CBC with Differential/Platelet     Status: None   Collection Time: 08/03/19  8:25 AM  Result Value Ref Range   WBC 5.8 3.4 - 10.8 x10E3/uL   RBC 5.13 4.14 - 5.80 x10E6/uL   Hemoglobin 16.3 13.0 - 17.7 g/dL   Hematocrit 46.6 37.5 - 51.0 %   MCV 91 79 - 97 fL   MCH 31.8 26.6 - 33.0 pg   MCHC 35.0 31.5 - 35.7 g/dL   RDW 13.6 11.6 - 15.4 %   Platelets 256 150 - 450 x10E3/uL   Neutrophils 44 Not Estab. %   Lymphs 44 Not Estab. %   Monocytes 9 Not Estab. %   Eos 2 Not Estab. %   Basos 1 Not Estab. %   Neutrophils Absolute 2.6 1.4 - 7.0 x10E3/uL   Lymphocytes Absolute 2.5 0.7 - 3.1 x10E3/uL   Monocytes Absolute 0.5 0.1 - 0.9 x10E3/uL   EOS (ABSOLUTE) 0.1 0.0 - 0.4 x10E3/uL   Basophils Absolute 0.1 0.0 -  0.2 x10E3/uL   Immature Granulocytes 0 Not Estab. %   Immature Grans (Abs) 0.0 0.0 - 0.1 x10E3/uL  Comprehensive metabolic panel     Status: None   Collection Time: 08/03/19  8:25 AM  Result Value Ref Range   Glucose 94 65 - 99 mg/dL   BUN 11 6 - 24 mg/dL   Creatinine, Ser 1.06 0.76 - 1.27 mg/dL   GFR calc non Af Amer 83 >59 mL/min/1.73   GFR calc Af Amer 95 >59 mL/min/1.73   BUN/Creatinine Ratio 10 9 - 20   Sodium 140 134 - 144 mmol/L   Potassium 4.3 3.5 - 5.2 mmol/L   Chloride 102 96 - 106 mmol/L   CO2 25 20 - 29 mmol/L   Calcium 9.5 8.7 - 10.2 mg/dL   Total Protein 7.1 6.0 - 8.5 g/dL   Albumin 4.5 4.0 - 5.0 g/dL   Globulin, Total 2.6 1.5 - 4.5 g/dL   Albumin/Globulin Ratio 1.7 1.2 - 2.2   Bilirubin Total 0.7 0.0 - 1.2 mg/dL   Alkaline Phosphatase 71 39 - 117 IU/L   AST 18 0 - 40 IU/L   ALT 24 0 - 44 IU/L  PSA     Status: None   Collection Time:  08/03/19  8:25 AM  Result Value Ref Range   Prostate Specific Ag, Serum 0.5 0.0 - 4.0 ng/mL    Comment: Roche ECLIA methodology. According to the American Urological Association, Serum PSA should decrease and remain at undetectable levels after radical prostatectomy. The AUA defines biochemical recurrence as an initial PSA value 0.2 ng/mL or greater followed by a subsequent confirmatory PSA value 0.2 ng/mL or greater. Values obtained with different assay methods or kits cannot be used interchangeably. Results cannot be interpreted as absolute evidence of the presence or absence of malignant disease.   Testosterone,Free and Total     Status: None   Collection Time: 09/22/19  8:59 AM  Result Value Ref Range   Testosterone 288 264 - 916 ng/dL    Comment: Adult male reference interval is based on a population of healthy nonobese males (BMI <30) between 78 and 7 years old. [place], [place] [phone]. PMID: [id number].    Testosterone, Free 10.9 6.8 - 21.5 pg/mL      ASSESSMENT: 1.  Hypogonadism  2. Erectile Dysfunction 3.  Polycythemia  PLAN:  1. Hypogonadism  -He remains off of testosterone due to secondary polycythemia.  He reportedly donated blood on 2 occasions since last visit which helped lower his hematocrit/hemoglobin to normal range.  -More recently his CBC is normal.  His previsit labs showed controlled on testosterone of 288 improving from 276, with normal PSA.  -Although he wishes to be treated with testosterone, I approached him with a plan to delay reinitiation of testosterone until repeat test  In  3 to 4 months.   -His history of  secondary polycythemia in the past which required periodic phlebotomy, as well as sleep apnea requiring CPAP treatment A poor candidate for testosterone replacement.   He will be reconsidered for initiation of testosterone starting low-dose as appropriate next visit, if his next total testosterone drops to below  250.   Time for this visit: 15 minutes. Paul Parrish  participated in the discussions, expressed understanding, and voiced agreement with the above plans.  All questions were answered to his satisfaction. he is encouraged to contact clinic should he have any questions or concerns prior to his return visit.   Glade Lloyd, MD Phone:  [phone]  Fax: [phone]  -  This note was partially dictated with voice recognition software. Similar sounding words can be transcribed inadequately or may not  be corrected upon review.  09/28/2019, 3:41 PM

## 2019-10-07 ENCOUNTER — Encounter (HOSPITAL_COMMUNITY): Payer: Self-pay | Admitting: Psychiatry

## 2019-10-07 ENCOUNTER — Ambulatory Visit (INDEPENDENT_AMBULATORY_CARE_PROVIDER_SITE_OTHER): Payer: 59 | Admitting: Psychiatry

## 2019-10-07 ENCOUNTER — Other Ambulatory Visit: Payer: Self-pay

## 2019-10-07 DIAGNOSIS — F410 Panic disorder [episodic paroxysmal anxiety] without agoraphobia: Secondary | ICD-10-CM

## 2019-10-07 DIAGNOSIS — F411 Generalized anxiety disorder: Secondary | ICD-10-CM | POA: Diagnosis not present

## 2019-10-07 DIAGNOSIS — F331 Major depressive disorder, recurrent, moderate: Secondary | ICD-10-CM | POA: Diagnosis not present

## 2019-10-07 DIAGNOSIS — F4010 Social phobia, unspecified: Secondary | ICD-10-CM | POA: Diagnosis not present

## 2019-10-07 MED ORDER — TRAZODONE HCL 100 MG PO TABS: 100.0000 mg | ORAL_TABLET | Freq: Every evening | ORAL | 2 refills | Status: DC | PRN

## 2019-10-07 MED ORDER — LORAZEPAM 0.5 MG PO TABS: ORAL_TABLET | ORAL | 2 refills | Status: DC

## 2019-10-07 MED ORDER — GABAPENTIN 300 MG PO CAPS: 300.0000 mg | ORAL_CAPSULE | Freq: Every day | ORAL | 2 refills | Status: DC

## 2019-10-07 MED ORDER — CLOMIPRAMINE HCL 25 MG PO CAPS: 25.0000 mg | ORAL_CAPSULE | Freq: Two times a day (BID) | ORAL | 2 refills | Status: DC

## 2019-10-07 NOTE — Progress Notes (Signed)
Virtual Visit via Telephone Note  I connected with Paul Parrish on 10/07/19 at 10:20 AM EST by telephone and verified that I am speaking with the correct person using two identifiers.   I discussed the limitations, risks, security and privacy concerns of performing an evaluation and management service by telephone and the availability of in person appointments. I also discussed with the patient that there may be a patient responsible charge related to this service. The patient expressed understanding and agreed to proceed.   History of Present Illness: Patient was evaluated by phone session.  He is taking Anafranil 25 mg twice a day.  He noticed much improvement in his anxiety depression and nervousness.  He does not have as much social anxiety and he usually goes outside but due to [place] has not been as much recently he seen his physician for blood work and his hemoglobin and RBC count is much better.  Patient has hypogonadism and cytopenia.  He was hoping to get testosterone injection but due to polycythemia vera it was on hold for now.  He will recheck his blood work.  Patient tolerating Anafranil very well.  He has no tremors, shakes or any EPS.  He admits sometimes lack of motivation and feeling tired but also endorsed weight gain since last visit.  Since he moved to new place in July he has not been involved in exercise.  Patient usually works from home and that also does not help his mobility.  He lives with his wife and stepchild.  Patient works at [employer] in [employer].  He is using lorazepam mostly every day but rarely second tablet if needed for panic attacks.  He is sleeping good with gabapentin.  He like to keep his current medication.  He is not interested in therapy.   PastPsychiatric History:Reviewed. H/Oanxiety, depression and seen psychiatrist at neuropsychiatry in [place] but not happy with the treatment. Saw Dr. Daron Offer in 2018 and prescribed Prozac, Xanax,  Effexor, Celexa, propanolol, Lamictal, Vistaril, Adderall, Wellbutrin,  Cymbalta(increased irritability),Anafranil(initially work and then stopped working,could not afford)andLuvox (did not help).H/Oof ECT with no response. No h/osuicidal attempt or inpatient psychiatric treatment. At [facility] tried [place], Klonopin but did not work. H/Oheavy drinking but claims to be sober.   Recent Results (from the past 2160 hour(s))  Testosterone, Free, Total, SHBG     Status: None   Collection Time: 08/03/19  8:24 AM  Result Value Ref Range   Testosterone 276 264 - 916 ng/dL    Comment: Adult male reference interval is based on a population of healthy nonobese males (BMI <30) between 37 and 1 years old. [place], [place] [phone]. PMID: [id number].    Testosterone, Free 9.8 6.8 - 21.5 pg/mL   Sex Hormone Binding 27.7 16.5 - 55.9 nmol/L  CBC with Differential/Platelet     Status: None   Collection Time: 08/03/19  8:25 AM  Result Value Ref Range   WBC 5.8 3.4 - 10.8 x10E3/uL   RBC 5.13 4.14 - 5.80 x10E6/uL   Hemoglobin 16.3 13.0 - 17.7 g/dL   Hematocrit 46.6 37.5 - 51.0 %   MCV 91 79 - 97 fL   MCH 31.8 26.6 - 33.0 pg   MCHC 35.0 31.5 - 35.7 g/dL   RDW 13.6 11.6 - 15.4 %   Platelets 256 150 - 450 x10E3/uL   Neutrophils 44 Not Estab. %   Lymphs 44 Not Estab. %   Monocytes 9 Not Estab. %   Eos 2 Not  Estab. %   Basos 1 Not Estab. %   Neutrophils Absolute 2.6 1.4 - 7.0 x10E3/uL   Lymphocytes Absolute 2.5 0.7 - 3.1 x10E3/uL   Monocytes Absolute 0.5 0.1 - 0.9 x10E3/uL   EOS (ABSOLUTE) 0.1 0.0 - 0.4 x10E3/uL   Basophils Absolute 0.1 0.0 - 0.2 x10E3/uL   Immature Granulocytes 0 Not Estab. %   Immature Grans (Abs) 0.0 0.0 - 0.1 x10E3/uL  Comprehensive metabolic panel     Status: None   Collection Time: 08/03/19  8:25 AM  Result Value Ref Range   Glucose 94 65 - 99 mg/dL   BUN 11 6 - 24 mg/dL   Creatinine, Ser 1.06 0.76 - 1.27 mg/dL   GFR calc non Af Amer 83 >59  mL/min/1.73   GFR calc Af Amer 95 >59 mL/min/1.73   BUN/Creatinine Ratio 10 9 - 20   Sodium 140 134 - 144 mmol/L   Potassium 4.3 3.5 - 5.2 mmol/L   Chloride 102 96 - 106 mmol/L   CO2 25 20 - 29 mmol/L   Calcium 9.5 8.7 - 10.2 mg/dL   Total Protein 7.1 6.0 - 8.5 g/dL   Albumin 4.5 4.0 - 5.0 g/dL   Globulin, Total 2.6 1.5 - 4.5 g/dL   Albumin/Globulin Ratio 1.7 1.2 - 2.2   Bilirubin Total 0.7 0.0 - 1.2 mg/dL   Alkaline Phosphatase 71 39 - 117 IU/L   AST 18 0 - 40 IU/L   ALT 24 0 - 44 IU/L  PSA     Status: None   Collection Time: 08/03/19  8:25 AM  Result Value Ref Range   Prostate Specific Ag, Serum 0.5 0.0 - 4.0 ng/mL    Comment: Roche ECLIA methodology. According to the American Urological Association, Serum PSA should decrease and remain at undetectable levels after radical prostatectomy. The AUA defines biochemical recurrence as an initial PSA value 0.2 ng/mL or greater followed by a subsequent confirmatory PSA value 0.2 ng/mL or greater. Values obtained with different assay methods or kits cannot be used interchangeably. Results cannot be interpreted as absolute evidence of the presence or absence of malignant disease.   Testosterone,Free and Total     Status: None   Collection Time: 09/22/19  8:59 AM  Result Value Ref Range   Testosterone 288 264 - 916 ng/dL    Comment: Adult male reference interval is based on a population of healthy nonobese males (BMI <30) between 55 and 72 years old. [place], [place] [phone]. PMID: [id number].    Testosterone, Free 10.9 6.8 - 21.5 pg/mL     Psychiatric Specialty Exam: Physical Exam  Review of Systems  Weight 275 lb (124.7 kg).Body mass index is 35.31 kg/m.  General Appearance: NA  Eye Contact:  NA  Speech:  Clear and Coherent  Volume:  Decreased  Mood:  Euthymic  Affect:  NA  Thought Process:  Goal Directed  Orientation:  Full (Time, Place, and Person)  Thought Content:  WDL and Logical  Suicidal Thoughts:   No  Homicidal Thoughts:  No  Memory:  Immediate;   Good Recent;   Good Remote;   Good  Judgement:  Good  Insight:  Present  Psychomotor Activity:  NA  Concentration:  Concentration: Good and Attention Span: Good  Recall:  Good  Fund of Knowledge:  Good  Language:  Good  Akathisia:  No  Handed:  Right  AIMS (if indicated):     Assets:  Communication Skills Desire for [place] Talents/Skills  ADL's:  Intact  Cognition:  WNL  Sleep:   Okay      Assessment and Plan: Major depressive disorder, recurrent.  Social anxiety disorder.  Panic attacks.  I reviewed his blood work results and current medication.  Patient doing very well on Anafranil, gabapentin, trazodone and lorazepam.  Discussed medication side effect specially benzodiazepine dependence tolerance and withdrawal.  Recommended not to take more than prescribed Ativan.  Recommended to call us back if is any question or any concern.  Follow-up in 3 months.  Follow Up Instructions:    I discussed the assessment and treatment plan with the patient. The patient was provided an opportunity to ask questions and all were answered. The patient agreed with the plan and demonstrated an understanding of the instructions.   The patient was advised to call back or seek an in-person evaluation if the symptoms worsen or if the condition fails to improve as anticipated.  I provided 20 minutes of non-face-to-face time during this encounter.   Kathlee Nations, MD

## 2019-11-03 ENCOUNTER — Other Ambulatory Visit: Payer: Self-pay | Admitting: Family Medicine

## 2019-11-03 MED ORDER — SILDENAFIL CITRATE 20 MG PO TABS: ORAL_TABLET | ORAL | 0 refills | Status: DC

## 2019-12-09 ENCOUNTER — Ambulatory Visit: Payer: 59 | Admitting: "Endocrinology

## 2019-12-18 ENCOUNTER — Ambulatory Visit: Payer: Self-pay

## 2019-12-18 ENCOUNTER — Ambulatory Visit: Payer: Self-pay | Attending: Internal Medicine

## 2019-12-18 DIAGNOSIS — Z23 Encounter for immunization: Secondary | ICD-10-CM

## 2019-12-18 NOTE — Progress Notes (Signed)
   Covid-19 Vaccination Clinic  Name:  Paul Parrish    MRN: [id number] DOB: 30-Apr-1971  12/18/2019  [name] was observed post Covid-19 immunization for 15 minutes without incident. He was provided with Vaccine Information Sheet and instruction to access the V-Safe system.   [name] was instructed to call 911 with any severe reactions post vaccine: Marland Kitchen Difficulty breathing  . Swelling of face and throat  . A fast heartbeat  . A bad rash all over body  . Dizziness and weakness   Immunizations Administered    Name Date Dose VIS Date Route   Moderna COVID-19 Vaccine 12/18/2019  8:37 AM 0.5 mL 08/31/2019 Intramuscular   Manufacturer: Moderna   Lot: [id number]   [id number]

## 2019-12-20 ENCOUNTER — Other Ambulatory Visit: Payer: Self-pay | Admitting: "Endocrinology

## 2019-12-23 LAB — TESTOSTERONE,FREE AND TOTAL
Testosterone, Free: 6.3 pg/mL — ABNORMAL LOW (ref 6.8–21.5)
Testosterone: 241 ng/dL — ABNORMAL LOW (ref 264–916)

## 2019-12-27 ENCOUNTER — Ambulatory Visit (INDEPENDENT_AMBULATORY_CARE_PROVIDER_SITE_OTHER): Payer: 59 | Admitting: "Endocrinology

## 2019-12-27 ENCOUNTER — Encounter: Payer: Self-pay | Admitting: "Endocrinology

## 2019-12-27 DIAGNOSIS — N529 Male erectile dysfunction, unspecified: Secondary | ICD-10-CM

## 2019-12-27 DIAGNOSIS — D751 Secondary polycythemia: Secondary | ICD-10-CM

## 2019-12-27 DIAGNOSIS — E291 Testicular hypofunction: Secondary | ICD-10-CM

## 2019-12-27 MED ORDER — TESTOSTERONE 20.25 MG/ACT (1.62%) TD GEL: TRANSDERMAL | 0 refills | Status: DC

## 2019-12-27 NOTE — Progress Notes (Signed)
12/27/2019                                     Endocrinology Telehealth Visit Follow up Note -During COVID -19 Pandemic  I connected with Paul Parrish on 12/27/2019   by telephone and verified that I am speaking with the correct person using two identifiers. Paul Parrish, January 16, 1971. he has verbally consented to this visit. All issues noted in this document were discussed and addressed. The format was not optimal for physical exam.     HPI: Paul Parrish is a 49 y.o.-year-old man.  - He is being engaged in telehealth via telephone  for follow-up of hypogonadism.  He was diagnosed with hypogonadism in 2014, etiology unclear.   -Due to polycythemia, he was advised to stay off of testosterone supplement during his prior visits.  Prior to his last visit he has donated blood on 2 occasions lowering his hematocrit to normal range.   His previsit labs show total testosterone dropping to 241 from 288.  He reports fatigue, low libido. He did not get any form of testosterone supplements since July 2020. Previously he was treated with testosterone 100 mg IM every other week.   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   -He reports history of seizures as a young man until his teenage years. -He denies any history of head injury.  - He also is recently diagnosed with sleep apnea currently on CPAP.  - He fathers 2 teenage boys biologically. - He has significant mood disorder on multiple antidepressants and antianxiety. No herbal medicines. -He denies family history of premature coronary artery disease.   ROS: Limited as above.  PE: There were no vitals taken for this visit. Wt Readings from Last 3 Encounters:  05/05/19 272 lb (123.4 kg)  10/05/18 282 lb (127.9 kg)  08/19/18 283 lb 12.8 oz (128.7 kg)     Recent Results (from the past 2160 hour(s))  Testosterone,Free and Total     Status: Abnormal (Preliminary result)   Collection Time:  12/20/19  8:08 AM  Result Value Ref Range   Testosterone 241 (L) 264 - 916 ng/dL    Comment: Adult male reference interval is based on a population of healthy nonobese males (BMI <30) between 72 and 47 years old. [place], [place] [phone]. PMID: [id number].    Testosterone, Free WILL FOLLOW       ASSESSMENT: 1.  Hypogonadism  2. Erectile Dysfunction 3.  Polycythemia  PLAN:  1. Hypogonadism  -He remains off of testosterone due to secondary polycythemia.  He reportedly donated blood on 2 occasions since last visit which helped lower his hematocrit/hemoglobin to normal range.  -More recently his CBC is normal.  His previsit labs showed drop in total testosterone to 241 from 288.    -He wishes to be reinitiated on testosterone supplement.  Will be considered for low-dose topical testosterone with AndroGel 1.62% 20.25 mg topically every day with plan to measure total testosterone along with his PSA, CBC in 3 months with office visit.    Risk of transskin contamination with other intimate contact discussed with patient and ways of avoiding it.  He is advised to apply his testosterone in the morning and keep application area closed all day.    His history of  secondary polycythemia in the past which required periodic phlebotomy, as well as sleep apnea requiring CPAP treatment making  him a poor candidate for more generous testosterone replacement.  He is advised to continue close follow-up with his PMD for primary care needs.     - Time spent on this patient care encounter:  25 minutes of which 50% was spent in  counseling and the rest reviewing  his current and  previous labs / studies and medications  doses and developing a plan for long term care. Paul Parrish  participated in the discussions, expressed understanding, and voiced agreement with the above plans.  All questions were answered to his satisfaction. he is encouraged to contact clinic should he have any questions or  concerns prior to his return visit.    Glade Lloyd, MD Phone: [phone]  Fax: [phone]  -  This note was partially dictated with voice recognition software. Similar sounding words can be transcribed inadequately or may not  be corrected upon review.  12/27/2019, 10:08 AM

## 2019-12-30 ENCOUNTER — Other Ambulatory Visit: Payer: Self-pay

## 2019-12-30 MED ORDER — TESTOSTERONE 20.25 MG/ACT (1.62%) TD GEL: TRANSDERMAL | 0 refills | Status: DC

## 2020-01-03 ENCOUNTER — Other Ambulatory Visit: Payer: Self-pay | Admitting: "Endocrinology

## 2020-01-03 MED ORDER — ANDRODERM 4 MG/24HR TD PT24: 1.0000 | MEDICATED_PATCH | Freq: Every day | TRANSDERMAL | 2 refills | Status: DC

## 2020-01-04 ENCOUNTER — Ambulatory Visit: Payer: Self-pay

## 2020-01-05 ENCOUNTER — Encounter (HOSPITAL_COMMUNITY): Payer: Self-pay | Admitting: Psychiatry

## 2020-01-05 ENCOUNTER — Ambulatory Visit (INDEPENDENT_AMBULATORY_CARE_PROVIDER_SITE_OTHER): Payer: 59 | Admitting: Psychiatry

## 2020-01-05 ENCOUNTER — Other Ambulatory Visit: Payer: Self-pay

## 2020-01-05 DIAGNOSIS — F331 Major depressive disorder, recurrent, moderate: Secondary | ICD-10-CM | POA: Diagnosis not present

## 2020-01-05 DIAGNOSIS — F411 Generalized anxiety disorder: Secondary | ICD-10-CM | POA: Diagnosis not present

## 2020-01-05 DIAGNOSIS — F410 Panic disorder [episodic paroxysmal anxiety] without agoraphobia: Secondary | ICD-10-CM | POA: Diagnosis not present

## 2020-01-05 DIAGNOSIS — F4010 Social phobia, unspecified: Secondary | ICD-10-CM | POA: Diagnosis not present

## 2020-01-05 MED ORDER — LORAZEPAM 0.5 MG PO TABS: ORAL_TABLET | ORAL | 2 refills | Status: DC

## 2020-01-05 MED ORDER — TRAZODONE HCL 100 MG PO TABS: 100.0000 mg | ORAL_TABLET | Freq: Every evening | ORAL | 2 refills | Status: DC | PRN

## 2020-01-05 MED ORDER — GABAPENTIN 300 MG PO CAPS: 300.0000 mg | ORAL_CAPSULE | Freq: Every day | ORAL | 2 refills | Status: DC

## 2020-01-05 MED ORDER — CLOMIPRAMINE HCL 25 MG PO CAPS: 25.0000 mg | ORAL_CAPSULE | Freq: Two times a day (BID) | ORAL | 2 refills | Status: DC

## 2020-01-05 NOTE — Progress Notes (Signed)
Virtual Visit via Telephone Note  I connected with [name] on 01/05/20 at 10:20 AM EDT by telephone and verified that I am speaking with the correct person using two identifiers.   I discussed the limitations, risks, security and privacy concerns of performing an evaluation and management service by telephone and the availability of in person appointments. I also discussed with the patient that there may be a patient responsible charge related to this service. The patient expressed understanding and agreed to proceed.   History of Present Illness: Patient was evaluated by phone session.  He is taking Anafranil 25 mg twice a day which is helping his anxiety and depression.  He is sleeping better with the help of trazodone, gabapentin and sometimes he takes melatonin 10 mg.  Recently he seen his physician and had testosterone level which was low.  He is no longer taking it injection because of concern of polycythemia vera.  His physician prescribed the patches and he is trying to get approval from the insurance.  Otherwise his job is going well.  He is working from home.  He works at [employer].  Lives with his wife and stepchild.  He has some anxiety but overall he feels his mood is under control.  He does not go outside as much so his social anxiety is also much better.  He has no major panic attack but still takes lorazepam mostly every day to prevent these panic attacks.  Rarely he take the second dose.  He has no tremors, shakes or any EPS.  He started exercising he lost few pounds since the last visit.  He is not interested in therapy.   PastPsychiatric History:Reviewed. H/Oanxiety, depression and saw psychiatrist at neuropsychiatry in [place] but not happy with the treatment. Saw Dr. Daron Offer in 2018 and prescribed Prozac, Xanax, Effexor, Celexa, propanolol, Lamictal, Vistaril, Adderall,Wellbutrin,Cymbalta(increased irritability),Anafranil andLuvox (did not  help).H/Oof ECT with no response. No h/osuicidal attempt or inpatient. At [facility] tried [place], Klonopin but did not work. H/Oheavy drinking but claims to be sober.  Psychiatric Specialty Exam: Physical Exam  Review of Systems  There were no vitals taken for this visit.There is no height or weight on file to calculate BMI.  General Appearance: NA  Eye Contact:  NA  Speech:  Clear and Coherent  Volume:  Normal  Mood:  Euthymic  Affect:  NA  Thought Process:  Goal Directed  Orientation:  Full (Time, Place, and Person)  Thought Content:  WDL  Suicidal Thoughts:  No  Homicidal Thoughts:  No  Memory:  Immediate;   Good Recent;   Good Remote;   Good  Judgement:  Good  Insight:  Present  Psychomotor Activity:  NA  Concentration:  Concentration: Fair and Attention Span: Fair  Recall:  [place] of Knowledge:  Good  Language:  Good  Akathisia:  No  Handed:  Right  AIMS (if indicated):     Assets:  Communication Skills Desire for [place] Talents/Skills Transportation  ADL's:  Intact  Cognition:  WNL  Sleep:   good      Assessment and Plan: Major depressive disorder, recurrent.  Social anxiety disorder.  Panic attacks.  Patient is a stable on his current medication.  He does not want to change or increase the dose.  Continue lorazepam 0.5 mg as needed for panic attack daily and second if needed for severe anxiet attacks, Anafranil 25 mg twice a day, and gabapentin 300 mg  at bedtime and trazodone 100 mg at bedtime.  Patient is not interested in therapy.  Recommended to call us back if he has any question of any concern.  Discussed benzodiazepine dependence, tolerance and withdrawal.  Recommended to call us back if there is any question of any concern.  Follow-up in 3 months.  Follow Up Instructions:    I discussed the assessment and treatment plan with the patient. The patient was provided an opportunity to ask questions  and all were answered. The patient agreed with the plan and demonstrated an understanding of the instructions.   The patient was advised to call back or seek an in-person evaluation if the symptoms worsen or if the condition fails to improve as anticipated.  I provided 20 minutes of non-face-to-face time during this encounter.   Kathlee Nations, MD

## 2020-01-06 ENCOUNTER — Other Ambulatory Visit: Payer: Self-pay | Admitting: "Endocrinology

## 2020-01-06 MED ORDER — "SYRINGE/NEEDLE (DISP) 21G X 1-1/2"" 3 ML MISC": 1 refills | Status: DC

## 2020-01-06 MED ORDER — TESTOSTERONE CYPIONATE 50 MG/ML IJ SOLN: 50.0000 mg | INTRAMUSCULAR | 2 refills | Status: DC

## 2020-01-06 NOTE — Progress Notes (Unsigned)
Rx faxed to [facility].

## 2020-01-18 ENCOUNTER — Other Ambulatory Visit: Payer: Self-pay | Admitting: Family Medicine

## 2020-01-18 ENCOUNTER — Encounter: Payer: Self-pay | Admitting: Family Medicine

## 2020-01-19 ENCOUNTER — Ambulatory Visit: Payer: Self-pay | Attending: Internal Medicine

## 2020-01-19 DIAGNOSIS — Z23 Encounter for immunization: Secondary | ICD-10-CM

## 2020-01-19 NOTE — Progress Notes (Signed)
   Covid-19 Vaccination Clinic  Name:  Paul Parrish    MRN: [id number] DOB: August 04, 1971  01/19/2020  [name] was observed post Covid-19 immunization for 15 minutes without incident. He was provided with Vaccine Information Sheet and instruction to access the V-Safe system.   [name] was instructed to call 911 with any severe reactions post vaccine: Marland Kitchen Difficulty breathing  . Swelling of face and throat  . A fast heartbeat  . A bad rash all over body  . Dizziness and weakness   Immunizations Administered    Name Date Dose VIS Date Route   Moderna COVID-19 Vaccine 01/19/2020  3:30 PM 0.5 mL 08/2019 Intramuscular   Manufacturer: Moderna   Lot: [id number]   [id number]

## 2020-01-24 ENCOUNTER — Other Ambulatory Visit: Payer: Self-pay | Admitting: "Endocrinology

## 2020-02-04 ENCOUNTER — Other Ambulatory Visit: Payer: Self-pay | Admitting: "Endocrinology

## 2020-02-04 MED ORDER — TESTOSTERONE CYPIONATE 100 MG/ML IJ SOLN: 50.0000 mg | INTRAMUSCULAR | 2 refills | Status: DC

## 2020-02-10 ENCOUNTER — Other Ambulatory Visit: Payer: Self-pay | Admitting: *Deleted

## 2020-02-10 ENCOUNTER — Encounter: Payer: Self-pay | Admitting: Family Medicine

## 2020-02-10 DIAGNOSIS — E781 Pure hyperglyceridemia: Secondary | ICD-10-CM

## 2020-02-10 DIAGNOSIS — R7989 Other specified abnormal findings of blood chemistry: Secondary | ICD-10-CM

## 2020-02-16 ENCOUNTER — Ambulatory Visit: Payer: 59 | Admitting: Family Medicine

## 2020-02-16 ENCOUNTER — Encounter: Payer: Self-pay | Admitting: Family Medicine

## 2020-02-16 ENCOUNTER — Other Ambulatory Visit: Payer: Self-pay

## 2020-02-16 VITALS — BP 124/78 | HR 91 | Temp 97.8°F | Ht 74.0 in | Wt 280.2 lb

## 2020-02-16 DIAGNOSIS — R7989 Other specified abnormal findings of blood chemistry: Secondary | ICD-10-CM

## 2020-02-16 DIAGNOSIS — E781 Pure hyperglyceridemia: Secondary | ICD-10-CM | POA: Diagnosis not present

## 2020-02-16 DIAGNOSIS — N522 Drug-induced erectile dysfunction: Secondary | ICD-10-CM

## 2020-02-16 DIAGNOSIS — R030 Elevated blood-pressure reading, without diagnosis of hypertension: Secondary | ICD-10-CM

## 2020-02-16 LAB — LIPID PANEL
Chol/HDL Ratio: 7 ratio — ABNORMAL HIGH (ref 0.0–5.0)
Cholesterol, Total: 211 mg/dL — ABNORMAL HIGH (ref 100–199)
HDL: 30 mg/dL — ABNORMAL LOW (ref >39)
LDL Chol Calc (NIH): 133 mg/dL — ABNORMAL HIGH (ref 0–99)
Triglycerides: 266 mg/dL — ABNORMAL HIGH (ref 0–149)
VLDL Cholesterol Cal: 48 mg/dL — ABNORMAL HIGH (ref 5–40)

## 2020-02-16 LAB — CMP14+EGFR
ALT: 28 IU/L (ref 0–44)
AST: 22 IU/L (ref 0–40)
Albumin/Globulin Ratio: 1.7 (ref 1.2–2.2)
Albumin: 4.6 g/dL (ref 4.0–5.0)
Alkaline Phosphatase: 70 IU/L (ref 48–121)
BUN/Creatinine Ratio: 11 (ref 9–20)
BUN: 12 mg/dL (ref 6–24)
Bilirubin Total: 0.4 mg/dL (ref 0.0–1.2)
CO2: 24 mmol/L (ref 20–29)
Calcium: 9.8 mg/dL (ref 8.7–10.2)
Chloride: 100 mmol/L (ref 96–106)
Creatinine, Ser: 1.09 mg/dL (ref 0.76–1.27)
GFR calc Af Amer: 92 mL/min/{1.73_m2} (ref >59)
GFR calc non Af Amer: 80 mL/min/{1.73_m2} (ref >59)
Globulin, Total: 2.7 g/dL (ref 1.5–4.5)
Glucose: 82 mg/dL (ref 65–99)
Potassium: 4.4 mmol/L (ref 3.5–5.2)
Sodium: 138 mmol/L (ref 134–144)
Total Protein: 7.3 g/dL (ref 6.0–8.5)

## 2020-02-16 LAB — CBC WITH DIFFERENTIAL/PLATELET
Basophils Absolute: 0.1 10*3/uL (ref 0.0–0.2)
Basos: 1 %
EOS (ABSOLUTE): 0.1 10*3/uL (ref 0.0–0.4)
Eos: 2 %
Hematocrit: 52.7 % — ABNORMAL HIGH (ref 37.5–51.0)
Hemoglobin: 17.6 g/dL (ref 13.0–17.7)
Immature Grans (Abs): 0 10*3/uL (ref 0.0–0.1)
Immature Granulocytes: 0 %
Lymphocytes Absolute: 2.6 10*3/uL (ref 0.7–3.1)
Lymphs: 43 %
MCH: 30.5 pg (ref 26.6–33.0)
MCHC: 33.4 g/dL (ref 31.5–35.7)
MCV: 91 fL (ref 79–97)
Monocytes Absolute: 0.7 10*3/uL (ref 0.1–0.9)
Monocytes: 11 %
Neutrophils Absolute: 2.6 10*3/uL (ref 1.4–7.0)
Neutrophils: 43 %
Platelets: 243 10*3/uL (ref 150–450)
RBC: 5.77 x10E6/uL (ref 4.14–5.80)
RDW: 12.8 % (ref 11.6–15.4)
WBC: 6 10*3/uL (ref 3.4–10.8)

## 2020-02-16 MED ORDER — SILDENAFIL CITRATE 20 MG PO TABS: 20.0000 mg | ORAL_TABLET | ORAL | 3 refills | Status: DC | PRN

## 2020-02-16 NOTE — Progress Notes (Signed)
BP 124/78   Pulse 91   Temp 97.8 F (36.6 C) (Temporal)   Ht 6\' 2"  (1.88 m)   Wt 280 lb 4 oz (127.1 kg)   BMI 35.98 kg/m    Subjective:   Patient ID: Paul Parrish, male    DOB: 17-Jul-1971, 49 y.o.   MRN: [id number]  HPI: Paul Parrish is a 49 y.o. male presenting on 02/16/2020 for Medical Management of Chronic Issues   HPI Patient is coming in for recheck for erectile dysfunction and refill medications.  He says he is currently using the sildenafil and he uses 3 or 4 tablets every time and it does work but he runs out of tablets too soon.  He denies any pain with urination or decreased urinary stream.  He mainly uses it for erectile dysfunction.  Patient also sees endocrinology for testosterone replacement.  Hypertriglyceridemia Patient is coming in for recheck of his hypertriglyceridemia. The patient is currently taking fish oil 3 to 4 g daily. They deny any issues with myalgias or history of liver damage from it. They deny any focal numbness or weakness or chest pain.   Patient had elevated blood pressure reading on his last visit, his blood pressure looks great today.  124/78  Patient had elevated liver function on his last visit.  Recheck that today.  Relevant past medical, surgical, family and social history reviewed and updated as indicated. Interim medical history since our last visit reviewed. Allergies and medications reviewed and updated.  Review of Systems  Constitutional: Negative for chills and fever.  Respiratory: Negative for shortness of breath and wheezing.   Cardiovascular: Negative for chest pain and leg swelling.  Musculoskeletal: Negative for back pain and gait problem.  Skin: Negative for rash.  Neurological: Negative for dizziness, weakness and light-headedness.  All other systems reviewed and are negative.   Per HPI unless specifically indicated above   Allergies as of 02/16/2020      Reactions   Amoxicillin Other (See Comments)   High fever and  possible heart racing   Bactrim [sulfamethoxazole-trimethoprim] Other (See Comments)   Fever, body aches, chills   Penicillins Other (See Comments)   High fever Has patient had a PCN reaction causing immediate rash, facial/tongue/throat swelling, SOB or lightheadedness with hypotension: No Has patient had a PCN reaction causing severe rash involving mucus membranes or skin necrosis: No Has patient had a PCN reaction that required hospitalization No Has patient had a PCN reaction occurring within the last 10 years: No If all of the above answers are "NO", then may proceed with Cephalosporin use.      Medication List       Accurate as of Feb 16, 2020  3:57 PM. If you have any questions, ask your nurse or doctor.        clomiPRAMINE 25 MG capsule Commonly known as: ANAFRANIL Take 1 capsule (25 mg total) by mouth 2 (two) times daily.   gabapentin 300 MG capsule Commonly known as: NEURONTIN Take 1 capsule (300 mg total) by mouth at bedtime.   LORazepam 0.5 MG tablet Commonly known as: Ativan Take one tab daily as needed for anxiety and 2nd if needed again   Melatonin 10 MG Tabs Take by mouth.   sildenafil 20 MG tablet Commonly known as: REVATIO Take 1-5 tablets (20-100 mg total) by mouth as needed. Take 2-5 tabs PRN prior to sexual activity What changed:   how much to take  how to take this  when to take  this  reasons to take this Changed by: Worthy Rancher, MD   Skyrizi (150 MG Dose) 75 MG/0.83ML Pskt Generic drug: Risankizumab-rzaa(150 MG Dose)   SYRINGE-NEEDLE (DISP) 3 ML 21G X 1-1/2" 3 ML Misc Use to inject testosterone every week   Testosterone Cypionate 100 MG/ML Soln Inject 50 mg as directed every 7 (seven) days.   traZODone 100 MG tablet Commonly known as: DESYREL Take 1 tablet (100 mg total) by mouth at bedtime as needed for sleep.        Objective:   BP 124/78   Pulse 91   Temp 97.8 F (36.6 C) (Temporal)   Ht 6\' 2"  (1.88 m)   Wt 280 lb  4 oz (127.1 kg)   BMI 35.98 kg/m   Wt Readings from Last 3 Encounters:  02/16/20 280 lb 4 oz (127.1 kg)  05/05/19 272 lb (123.4 kg)  10/05/18 282 lb (127.9 kg)    Physical Exam Vitals and nursing note reviewed.  Constitutional:      General: He is not in acute distress.    Appearance: He is well-developed. He is not diaphoretic.  Eyes:     General: No scleral icterus.    Conjunctiva/sclera: Conjunctivae normal.  Neck:     Thyroid: No thyromegaly.  Cardiovascular:     Rate and Rhythm: Normal rate and regular rhythm.     Heart sounds: Normal heart sounds. No murmur.  Pulmonary:     Effort: Pulmonary effort is normal. No respiratory distress.     Breath sounds: Normal breath sounds. No wheezing.  Musculoskeletal:        General: Normal range of motion.     Cervical back: Neck supple.  Lymphadenopathy:     Cervical: No cervical adenopathy.  Skin:    General: Skin is warm and dry.     Findings: No rash.  Neurological:     Mental Status: He is alert and oriented to person, place, and time.     Coordination: Coordination normal.  Psychiatric:        Behavior: Behavior normal.       Assessment & Plan:   Problem List Items Addressed This Visit      Other   Erectile dysfunction - Primary   Relevant Medications   sildenafil (REVATIO) 20 MG tablet   Elevated blood pressure reading without diagnosis of hypertension   Hypertriglyceridemia   Relevant Medications   sildenafil (REVATIO) 20 MG tablet    Other Visit Diagnoses    Elevated LFTs          Refill sildenafil, will continue fish oils, recheck blood work.  Patient is also begin exercise regimen trying to lose weight and is doing a treadmill every day. Follow up plan: Return in about 6 months (around 08/18/2020), or if symptoms worsen or fail to improve, for Dyslipidemia recheck.  Counseling provided for all of the vaccine components No orders of the defined types were placed in this encounter.   [name], MD [facility] 02/16/2020, 3:57 PM

## 2020-03-28 ENCOUNTER — Ambulatory Visit: Payer: 59 | Admitting: "Endocrinology

## 2020-04-05 ENCOUNTER — Other Ambulatory Visit: Payer: Self-pay

## 2020-04-05 ENCOUNTER — Telehealth (INDEPENDENT_AMBULATORY_CARE_PROVIDER_SITE_OTHER): Payer: 59 | Admitting: Psychiatry

## 2020-04-05 ENCOUNTER — Encounter (HOSPITAL_COMMUNITY): Payer: Self-pay | Admitting: Psychiatry

## 2020-04-05 DIAGNOSIS — F4010 Social phobia, unspecified: Secondary | ICD-10-CM

## 2020-04-05 DIAGNOSIS — F410 Panic disorder [episodic paroxysmal anxiety] without agoraphobia: Secondary | ICD-10-CM | POA: Diagnosis not present

## 2020-04-05 DIAGNOSIS — F331 Major depressive disorder, recurrent, moderate: Secondary | ICD-10-CM

## 2020-04-05 MED ORDER — CLOMIPRAMINE HCL 25 MG PO CAPS: 25.0000 mg | ORAL_CAPSULE | Freq: Two times a day (BID) | ORAL | 2 refills | Status: DC

## 2020-04-05 MED ORDER — TRAZODONE HCL 100 MG PO TABS: 100.0000 mg | ORAL_TABLET | Freq: Every evening | ORAL | 2 refills | Status: DC | PRN

## 2020-04-05 MED ORDER — LORAZEPAM 0.5 MG PO TABS: ORAL_TABLET | ORAL | 2 refills | Status: DC

## 2020-04-05 NOTE — Progress Notes (Signed)
Virtual Visit via Telephone Note  I connected with Paul Parrish on 04/05/20 at  9:20 AM EDT by telephone and verified that I am speaking with the correct person using two identifiers.  Location: Patient: home Provider: home office   I discussed the limitations, risks, security and privacy concerns of performing an evaluation and management service by telephone and the availability of in person appointments. I also discussed with the patient that there may be a patient responsible charge related to this service. The patient expressed understanding and agreed to proceed.   History of Present Illness: Patient is evaluated by phone session.  He is taking his medication as prescribed.  He continues to feel very anxious and nervous when he go outside especially when he has to go to the stores and facing the people.  He got sweaty but otherwise he is been doing well.  Recently had a blood work and his cholesterol and LDL were higher.  He started exercising every day and he lost 14 pounds since the last visit.  He is now more health-conscious.  He has polycythemia vera.  He is sleeping good.  He is working at [employer] in [employer].  He works 2:00 in the morning until 11:00.  He lives with his wife who is very supportive.  He denies any crying spells, feeling of hopelessness or worthlessness.  He does take lorazepam every day and rarely second if he has to go outside.  He has no tremors, shakes or any EPS.  He is not interested in therapy.   PastPsychiatric History:Reviewed. H/Oanxiety, depression and saw psychiatrist at neuropsychiatry in [place] but not happy with the treatment. Saw Dr. Daron Offer in 2018 and prescribed Prozac, Xanax, Effexor, Celexa, propanolol, Lamictal, Vistaril, Adderall,Wellbutrin,Cymbalta(increased irritability),Anafranil andLuvox (did not help).H/Oof ECT with no response. No h/osuicidal attempt or inpatient. At [facility] tried [place], Klonopin but  did not work. H/Oheavy drinking but claims to be sober.   Psychiatric Specialty Exam: Physical Exam  Review of Systems  Weight 264 lb (119.7 kg).There is no height or weight on file to calculate BMI.  General Appearance: NA  Eye Contact:  NA  Speech:  Clear and Coherent and Normal Rate  Volume:  Normal  Mood:  Anxious  Affect:  NA  Thought Process:  Goal Directed  Orientation:  Full (Time, Place, and Person)  Thought Content:  Logical  Suicidal Thoughts:  No  Homicidal Thoughts:  No  Memory:  Immediate;   Good Recent;   Good Remote;   Good  Judgement:  Good  Insight:  Good  Psychomotor Activity:  NA  Concentration:  Concentration: Good and Attention Span: Good  Recall:  Good  Fund of Knowledge:  Good  Language:  Good  Akathisia:  No  Handed:  Right  AIMS (if indicated):     Assets:  Communication Skills Desire for Improvement Housing Resilience Social Support Transportation  ADL's:  Intact  Cognition:  WNL  Sleep:   6-7 hrs      Assessment and Plan: Major depressive disorder, recurrent.  Social anxiety disorder.  Panic attack.  Patient doing better on his medication.  He has no major panic attacks but Klonopin helps his anxiety especially when he has to go outside.  He does not want to change medication.  He is not interested in therapy.  Continue Anafranil 25 mg twice a day, gabapentin 300 mg at bedtime and lorazepam 0.5 mg daily and second if needed.  Discussed medication side effects and  benefits.  Encouraged to continue healthy lifestyle and regular exercise.  I also reviewed his blood work.  Follow-up in 3 months.  Follow Up Instructions:    I discussed the assessment and treatment plan with the patient. The patient was provided an opportunity to ask questions and all were answered. The patient agreed with the plan and demonstrated an understanding of the instructions.   The patient was advised to call back or seek an in-person evaluation if the symptoms  worsen or if the condition fails to improve as anticipated.  I provided 15 minutes of non-face-to-face time during this encounter.   Kathlee Nations, MD

## 2020-04-14 ENCOUNTER — Other Ambulatory Visit: Payer: Self-pay | Admitting: "Endocrinology

## 2020-04-14 LAB — CBC AND DIFFERENTIAL
HCT: 51 (ref 41–53)
Hemoglobin: 17.7 — AB (ref 13.5–17.5)
Platelets: 270 (ref 150–399)

## 2020-04-14 LAB — PSA: PSA: 0.4

## 2020-04-14 LAB — BASIC METABOLIC PANEL WITH GFR: Creatinine: 1.2 (ref 0.6–1.3)

## 2020-04-14 LAB — COMPREHENSIVE METABOLIC PANEL: Albumin: 4.7 (ref 3.5–5.0)

## 2020-04-14 LAB — CBC: RBC: 5.76 — AB (ref 3.87–5.11)

## 2020-04-16 LAB — CBC WITH DIFFERENTIAL/PLATELET
Basophils Absolute: 0.1 10*3/uL (ref 0.0–0.2)
Basos: 1 %
EOS (ABSOLUTE): 0.1 10*3/uL (ref 0.0–0.4)
Eos: 1 %
Hematocrit: 50.9 % (ref 37.5–51.0)
Hemoglobin: 17.7 g/dL (ref 13.0–17.7)
Immature Grans (Abs): 0 10*3/uL (ref 0.0–0.1)
Immature Granulocytes: 0 %
Lymphocytes Absolute: 2.7 10*3/uL (ref 0.7–3.1)
Lymphs: 42 %
MCH: 30.7 pg (ref 26.6–33.0)
MCHC: 34.8 g/dL (ref 31.5–35.7)
MCV: 88 fL (ref 79–97)
Monocytes Absolute: 0.6 10*3/uL (ref 0.1–0.9)
Monocytes: 10 %
Neutrophils Absolute: 3 10*3/uL (ref 1.4–7.0)
Neutrophils: 46 %
Platelets: 270 10*3/uL (ref 150–450)
RBC: 5.76 x10E6/uL (ref 4.14–5.80)
RDW: 13.2 % (ref 11.6–15.4)
WBC: 6.5 10*3/uL (ref 3.4–10.8)

## 2020-04-16 LAB — COMPREHENSIVE METABOLIC PANEL
ALT: 26 IU/L (ref 0–44)
AST: 24 IU/L (ref 0–40)
Albumin/Globulin Ratio: 1.7 (ref 1.2–2.2)
Albumin: 4.7 g/dL (ref 4.0–5.0)
Alkaline Phosphatase: 73 IU/L (ref 48–121)
BUN/Creatinine Ratio: 12 (ref 9–20)
BUN: 15 mg/dL (ref 6–24)
Bilirubin Total: 0.5 mg/dL (ref 0.0–1.2)
CO2: 24 mmol/L (ref 20–29)
Calcium: 10 mg/dL (ref 8.7–10.2)
Chloride: 101 mmol/L (ref 96–106)
Creatinine, Ser: 1.24 mg/dL (ref 0.76–1.27)
GFR calc Af Amer: 79 mL/min/{1.73_m2} (ref >59)
GFR calc non Af Amer: 68 mL/min/{1.73_m2} (ref >59)
Globulin, Total: 2.8 g/dL (ref 1.5–4.5)
Glucose: 88 mg/dL (ref 65–99)
Potassium: 4.7 mmol/L (ref 3.5–5.2)
Sodium: 140 mmol/L (ref 134–144)
Total Protein: 7.5 g/dL (ref 6.0–8.5)

## 2020-04-16 LAB — TESTOSTERONE,FREE AND TOTAL
Testosterone, Free: 7.2 pg/mL (ref 6.8–21.5)
Testosterone: 248 ng/dL — ABNORMAL LOW (ref 264–916)

## 2020-04-16 LAB — PSA: Prostate Specific Ag, Serum: 0.4 ng/mL (ref 0.0–4.0)

## 2020-04-20 ENCOUNTER — Ambulatory Visit: Payer: 59 | Admitting: "Endocrinology

## 2020-04-20 ENCOUNTER — Encounter: Payer: Self-pay | Admitting: "Endocrinology

## 2020-04-20 ENCOUNTER — Other Ambulatory Visit: Payer: Self-pay

## 2020-04-20 VITALS — BP 125/82 | HR 77 | Ht 74.0 in | Wt 271.2 lb

## 2020-04-20 DIAGNOSIS — E162 Hypoglycemia, unspecified: Secondary | ICD-10-CM

## 2020-04-20 DIAGNOSIS — E291 Testicular hypofunction: Secondary | ICD-10-CM | POA: Diagnosis not present

## 2020-04-20 MED ORDER — TESTOSTERONE CYPIONATE 100 MG/ML IJ SOLN: INTRAMUSCULAR | 2 refills | Status: DC

## 2020-04-20 NOTE — Progress Notes (Signed)
04/20/2020       Endocrinology follow-up note   HPI: Paul Parrish is a 49 y.o.-year-old man.  - He is being engaged in telehealth via telephone  for follow-up of hypogonadism.  He was diagnosed with hypogonadism in 2014, etiology unclear.   -Due to polycythemia, he was advised to stay off of testosterone supplement during his prior visits.  Prior to his last visit he has donated blood on 2 occasions lowering his hematocrit to normal range.   His previsit labs show total testosterone dropping to 241 from 288.  He reports fatigue, low libido. He was recently reinitiated on testosterone supplement.  He is currently taking testosterone 50 mg IM every 7 days.  His previsit labs show total testosterone of 248.  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  He still does phlebotomy every 56 days.   -He reports history of seizures as a young man until his teenage years. -He denies any history of head injury.  - He also is recently diagnosed with sleep apnea currently on CPAP.  - He fathers 2 teenage boys biologically. - He has significant mood disorder on multiple antidepressants and antianxiety. No herbal medicines. -He denies family history of premature coronary artery disease.   ROS: Limited as above.  PE: BP 125/82   Pulse 77   Ht 6' 2" (1.88 m)   Wt (!) 271 lb 3.2 oz (123 kg)   BMI 34.82 kg/m  Wt Readings from Last 3 Encounters:  04/20/20 (!) 271 lb 3.2 oz (123 kg)  02/16/20 280 lb 4 oz (127.1 kg)  05/05/19 272 lb (123.4 kg)     Recent Results (from the past 2160 hour(s))  Lipid panel     Status: Abnormal   Collection Time: 02/16/20  8:01 AM  Result Value Ref Range   Cholesterol, Total 211 (H) 100 - 199 mg/dL   Triglycerides 266 (H) 0 - 149 mg/dL   HDL 30 (L) >39 mg/dL   VLDL Cholesterol Cal 48 (H) 5 - 40 mg/dL   LDL Chol Calc (NIH) 133 (H) 0 - 99 mg/dL   Chol/HDL Ratio 7.0 (H) 0.0 - 5.0 ratio    Comment:                                    T. Chol/HDL Ratio                                             Men  Women                               1/2 Avg.Risk  3.4    3.3                                   Avg.Risk  5.0    4.4                                2X Avg.Risk  9.6    7.1  3X Avg.Risk 23.4   11.0   CMP14+EGFR     Status: None   Collection Time: 02/16/20  8:01 AM  Result Value Ref Range   Glucose 82 65 - 99 mg/dL   BUN 12 6 - 24 mg/dL   Creatinine, Ser 1.09 0.76 - 1.27 mg/dL   GFR calc non Af Amer 80 >59 mL/min/1.73   GFR calc Af Amer 92 >59 mL/min/1.73    Comment: **[facility] currently reports eGFR in compliance with the current**   recommendations of the [employer]. [facility] will   update reporting as new guidelines are published from the NKF-ASN   Task force.    BUN/Creatinine Ratio 11 9 - 20   Sodium 138 134 - 144 mmol/L   Potassium 4.4 3.5 - 5.2 mmol/L   Chloride 100 96 - 106 mmol/L   CO2 24 20 - 29 mmol/L   Calcium 9.8 8.7 - 10.2 mg/dL   Total Protein 7.3 6.0 - 8.5 g/dL   Albumin 4.6 4.0 - 5.0 g/dL   Globulin, Total 2.7 1.5 - 4.5 g/dL   Albumin/Globulin Ratio 1.7 1.2 - 2.2   Bilirubin Total 0.4 0.0 - 1.2 mg/dL   Alkaline Phosphatase 70 48 - 121 IU/L    Comment:               **Please note reference interval change**   AST 22 0 - 40 IU/L   ALT 28 0 - 44 IU/L  CBC with Differential/Platelet     Status: Abnormal   Collection Time: 02/16/20  8:01 AM  Result Value Ref Range   WBC 6.0 3.4 - 10.8 x10E3/uL   RBC 5.77 4.14 - 5.80 x10E6/uL   Hemoglobin 17.6 13.0 - 17.7 g/dL   Hematocrit 52.7 (H) 37.5 - 51.0 %   MCV 91 79 - 97 fL   MCH 30.5 26.6 - 33.0 pg   MCHC 33.4 31 - 35 g/dL   RDW 12.8 11.6 - 15.4 %   Platelets 243 150 - 450 x10E3/uL   Neutrophils 43 Not Estab. %   Lymphs 43 Not Estab. %   Monocytes 11 Not Estab. %   Eos 2 Not Estab. %   Basos 1 Not Estab. %   Neutrophils Absolute 2.6 1 - 7 x10E3/uL   Lymphocytes Absolute 2.6 0 - 3 x10E3/uL    Monocytes Absolute 0.7 0 - 0 x10E3/uL   EOS (ABSOLUTE) 0.1 0.0 - 0.4 x10E3/uL   Basophils Absolute 0.1 0 - 0 x10E3/uL   Immature Granulocytes 0 Not Estab. %   Immature Grans (Abs) 0.0 0.0 - 0.1 x10E3/uL  CBC and differential     Status: Abnormal   Collection Time: 04/14/20 12:00 AM  Result Value Ref Range   Hemoglobin 17.7 (A) 13.5 - 17.5   HCT 51 41 - 53   Platelets 270 150 - 399  CBC     Status: Abnormal   Collection Time: 04/14/20 12:00 AM  Result Value Ref Range   RBC 5.76 (A) 3.87 - 6.76  Basic metabolic panel     Status: None   Collection Time: 04/14/20 12:00 AM  Result Value Ref Range   Creatinine 1.2 0.6 - 1.3  Comprehensive metabolic panel     Status: None   Collection Time: 04/14/20 12:00 AM  Result Value Ref Range   Albumin 4.7 3.5 - 5.0  PSA     Status: None   Collection Time: 04/14/20 12:00 AM  Result Value Ref Range   PSA 0.4  CBC with Differential/Platelet     Status: None   Collection Time: 04/14/20  8:29 AM  Result Value Ref Range   WBC 6.5 3.4 - 10.8 x10E3/uL   RBC 5.76 4.14 - 5.80 x10E6/uL   Hemoglobin 17.7 13.0 - 17.7 g/dL   Hematocrit 50.9 37.5 - 51.0 %   MCV 88 79 - 97 fL   MCH 30.7 26.6 - 33.0 pg   MCHC 34.8 31 - 35 g/dL   RDW 13.2 11.6 - 15.4 %   Platelets 270 150 - 450 x10E3/uL   Neutrophils 46 Not Estab. %   Lymphs 42 Not Estab. %   Monocytes 10 Not Estab. %   Eos 1 Not Estab. %   Basos 1 Not Estab. %   Neutrophils Absolute 3.0 1 - 7 x10E3/uL   Lymphocytes Absolute 2.7 0 - 3 x10E3/uL   Monocytes Absolute 0.6 0 - 0 x10E3/uL   EOS (ABSOLUTE) 0.1 0.0 - 0.4 x10E3/uL   Basophils Absolute 0.1 0 - 0 x10E3/uL   Immature Granulocytes 0 Not Estab. %   Immature Grans (Abs) 0.0 0.0 - 0.1 x10E3/uL  Comprehensive metabolic panel     Status: None   Collection Time: 04/14/20  8:29 AM  Result Value Ref Range   Glucose 88 65 - 99 mg/dL   BUN 15 6 - 24 mg/dL   Creatinine, Ser 1.24 0.76 - 1.27 mg/dL   GFR calc non Af Amer 68 >59 mL/min/1.73   GFR  calc Af Amer 79 >59 mL/min/1.73    Comment: **[facility] currently reports eGFR in compliance with the current**   recommendations of the [employer]. [facility] will   update reporting as new guidelines are published from the NKF-ASN   Task force.    BUN/Creatinine Ratio 12 9 - 20   Sodium 140 134 - 144 mmol/L   Potassium 4.7 3.5 - 5.2 mmol/L   Chloride 101 96 - 106 mmol/L   CO2 24 20 - 29 mmol/L   Calcium 10.0 8.7 - 10.2 mg/dL   Total Protein 7.5 6.0 - 8.5 g/dL   Albumin 4.7 4.0 - 5.0 g/dL   Globulin, Total 2.8 1.5 - 4.5 g/dL   Albumin/Globulin Ratio 1.7 1.2 - 2.2   Bilirubin Total 0.5 0.0 - 1.2 mg/dL   Alkaline Phosphatase 73 48 - 121 IU/L   AST 24 0 - 40 IU/L   ALT 26 0 - 44 IU/L  Testosterone,Free and Total     Status: Abnormal   Collection Time: 04/14/20  8:29 AM  Result Value Ref Range   Testosterone 248 (L) 264 - 916 ng/dL    Comment: Adult male reference interval is based on a population of healthy nonobese males (BMI <30) between 60 and 86 years old. [place], [place] [phone]. PMID: [id number].    Testosterone, Free 7.2 6.8 - 21.5 pg/mL  PSA     Status: None   Collection Time: 04/14/20  8:29 AM  Result Value Ref Range   Prostate Specific Ag, Serum 0.4 0.0 - 4.0 ng/mL    Comment: Roche ECLIA methodology. According to the American Urological Association, Serum PSA should decrease and remain at undetectable levels after radical prostatectomy. The AUA defines biochemical recurrence as an initial PSA value 0.2 ng/mL or greater followed by a subsequent confirmatory PSA value 0.2 ng/mL or greater. Values obtained with different assay methods or kits cannot be used interchangeably. Results cannot be interpreted as absolute evidence of the presence or absence of malignant disease.  ASSESSMENT: 1.  Hypogonadism  2. Erectile Dysfunction 3.  Polycythemia  PLAN:  1. Hypogonadism  -He continues to do phlebotomy every 56 days secondary  polycythemia.  -More recently his CBC is normal.  His previsit labs showed total testosterone is still low at 248.  He wishes to be continued on testosterone supplement.    He will benefit from slight increase in his testosterone supplement.    -He is advised to take 100 mg of testosterone IM every 10 days to give him for up to 300 mg monthly.   -I had a long discussion with him about safe use of testosterone, to avoid polycythemia.  His history of  secondary polycythemia which continues to require periodic phlebotomy, as well as sleep apnea requiring CPAP treatment making him a poor candidate for more generous testosterone replacement.  He showed me glucose meter that he uses at home with frequent tightening of glycemia to 40s and 60s.  She will be worked up for endogenous hyperinsulinemia during his next visit.  He is advised to continue close follow-up with his PMD for primary care needs.     - Time spent on this patient care encounter:  20 minutes of which 50% was spent in  counseling and the rest reviewing  his current and  previous labs / studies and medications  doses and developing a plan for long term care. [name]  participated in the discussions, expressed understanding, and voiced agreement with the above plans.  All questions were answered to his satisfaction. he is encouraged to contact clinic should he have any questions or concerns prior to his return visit.    Glade Lloyd, MD Phone: [phone]  Fax: [phone]  -  This note was partially dictated with voice recognition software. Similar sounding words can be transcribed inadequately or may not  be corrected upon review.  04/20/2020, 6:53 PM

## 2020-05-23 ENCOUNTER — Telehealth: Payer: Self-pay | Admitting: *Deleted

## 2020-05-23 MED ORDER — SKYRIZI (150 MG DOSE) 75 MG/0.83ML ~~LOC~~ PSKT
150.0000 mg | PREFILLED_SYRINGE | SUBCUTANEOUS | 2 refills | Status: DC
Start: 2020-05-23 — End: 2020-11-07

## 2020-05-23 NOTE — Telephone Encounter (Signed)
[facility] needing new precription for Skirizi 150mg  because the 75mg  dosing will be replaced.

## 2020-05-24 ENCOUNTER — Telehealth: Payer: Self-pay | Admitting: *Deleted

## 2020-05-24 NOTE — Telephone Encounter (Signed)
Called [facility] because they are requesting clinical note and prior therapy.  Informed them that patient has been on drug, all they are doing is switching from the 75mg -150mg  because they are no longer making 75mg .  Informed her that Iantha Fallen has all the documents that they should need.  Told to let us know if that's not the case.

## 2020-05-25 ENCOUNTER — Telehealth: Payer: Self-pay | Admitting: *Deleted

## 2020-07-06 ENCOUNTER — Other Ambulatory Visit: Payer: Self-pay

## 2020-07-06 ENCOUNTER — Telehealth (INDEPENDENT_AMBULATORY_CARE_PROVIDER_SITE_OTHER): Payer: 59 | Admitting: Psychiatry

## 2020-07-06 ENCOUNTER — Encounter (HOSPITAL_COMMUNITY): Payer: Self-pay | Admitting: Psychiatry

## 2020-07-06 DIAGNOSIS — F410 Panic disorder [episodic paroxysmal anxiety] without agoraphobia: Secondary | ICD-10-CM

## 2020-07-06 DIAGNOSIS — F411 Generalized anxiety disorder: Secondary | ICD-10-CM | POA: Diagnosis not present

## 2020-07-06 DIAGNOSIS — F331 Major depressive disorder, recurrent, moderate: Secondary | ICD-10-CM

## 2020-07-06 DIAGNOSIS — F4010 Social phobia, unspecified: Secondary | ICD-10-CM | POA: Diagnosis not present

## 2020-07-06 MED ORDER — TRAZODONE HCL 100 MG PO TABS: 100.0000 mg | ORAL_TABLET | Freq: Every evening | ORAL | 2 refills | Status: DC | PRN

## 2020-07-06 MED ORDER — GABAPENTIN 300 MG PO CAPS: 300.0000 mg | ORAL_CAPSULE | Freq: Every day | ORAL | 2 refills | Status: DC

## 2020-07-06 MED ORDER — CLOMIPRAMINE HCL 25 MG PO CAPS: 25.0000 mg | ORAL_CAPSULE | Freq: Two times a day (BID) | ORAL | 2 refills | Status: DC

## 2020-07-06 MED ORDER — LORAZEPAM 0.5 MG PO TABS: ORAL_TABLET | ORAL | 2 refills | Status: DC

## 2020-07-06 NOTE — Progress Notes (Signed)
Virtual Visit via Telephone Note  I connected with Paul Parrish on 07/06/20 at 10:20 AM EDT by telephone and verified that I am speaking with the correct person using two identifiers.  Location: Patient: home Provider: home office   I discussed the limitations, risks, security and privacy concerns of performing an evaluation and management service by telephone and the availability of in person appointments. I also discussed with the patient that there may be a patient responsible charge related to this service. The patient expressed understanding and agreed to proceed.   History of Present Illness: Patient is evaluated by phone session.  He is taking his medication as prescribed.  He is now taking testosterone prescribed by his physician.  He admitted there are still some days when he is not motivated to do things.  He is not doing treadmill and again few pound since the last visit.  He is still feeling anxious and nervous when he goes to public places and carotid area.  However his job is going well which is working from home.  He works night shift.  He had a good support from his wife.  His 38 year old stepchild stopped school.  Patient is taking lorazepam every day and sometimes he takes second if needed or if he has to go to the grocery store.  He denies any major panic attack.  He has no tremors, shakes or any EPS.  He denies drinking or using any illegal substances.   PastPsychiatric History: H/Oanxiety, depression and sawpsychiatrist at neuropsychiatry in [place] but not happy with the treatment. Saw Dr. Daron Offer in 2018 and prescribed Prozac, Xanax, Effexor, Celexa, propanolol, Lamictal, Vistaril, Adderall,Wellbutrin,Cymbalta(increased irritability),Anafranil andLuvox (did not help).H/Oof ECT with no response. No h/osuicidal attempt or inpatient.At [facility] tried [place], Klonopin but did not work. H/Oheavy drinking but claims to be sober.  Psychiatric Specialty  Exam: Physical Exam  Review of Systems  There were no vitals taken for this visit.There is no height or weight on file to calculate BMI.  General Appearance: NA  Eye Contact:  NA  Speech:  Normal Rate  Volume:  Normal  Mood:  Anxious  Affect:  NA  Thought Process:  Descriptions of Associations: Intact  Orientation:  Full (Time, Place, and Person)  Thought Content:  Logical  Suicidal Thoughts:  No  Homicidal Thoughts:  No  Memory:  Immediate;   Good Recent;   Good Remote;   Good  Judgement:  Intact  Insight:  Present  Psychomotor Activity:  NA  Concentration:  Concentration: Good and Attention Span: Good  Recall:  Good  Fund of Knowledge:  Good  Language:  Good  Akathisia:  No  Handed:  Right  AIMS (if indicated):     Assets:  Communication Skills Desire for Improvement Housing Resilience Social Support  ADL's:  Intact  Cognition:  WNL  Sleep:   good      Assessment and Plan: Major depressive disorder, recurrent.  Social anxiety disorder.  Panic attack.  Patient is a stable on his current medication.  He does not want to change his dosage or medication since it is working.  Continue gabapentin 300 mg at bedtime, lorazepam 0.5 mg daily and second if needed for severe panic attack, Anafranil 25 mg twice a day and trazodone 100 mg at bedtime.  He also takes melatonin over-the-counter that helps his sleep.  Recommended to call us back if he has any question or any concern.  He is not interested in therapy.  I encourage to  have follow-up with his physician to check his testosterone level since now he is taking the testosterone if level is improved.  He agreed with the plan.  Recommended to call us back if is any question or any concern.  Follow-up in 3 months.  Follow Up Instructions:    I discussed the assessment and treatment plan with the patient. The patient was provided an opportunity to ask questions and all were answered. The patient agreed with the plan and  demonstrated an understanding of the instructions.   The patient was advised to call back or seek an in-person evaluation if the symptoms worsen or if the condition fails to improve as anticipated.  I provided 22 minutes of non-face-to-face time during this encounter.   Kathlee Nations, MD

## 2020-07-18 ENCOUNTER — Encounter: Payer: Self-pay | Admitting: Family Medicine

## 2020-07-22 LAB — CBC WITH DIFFERENTIAL/PLATELET
Basophils Absolute: 0.1 10*3/uL (ref 0.0–0.2)
Basos: 1 %
EOS (ABSOLUTE): 0.1 10*3/uL (ref 0.0–0.4)
Eos: 2 %
Hematocrit: 52.6 % — ABNORMAL HIGH (ref 37.5–51.0)
Hemoglobin: 17.8 g/dL — ABNORMAL HIGH (ref 13.0–17.7)
Immature Grans (Abs): 0 10*3/uL (ref 0.0–0.1)
Immature Granulocytes: 0 %
Lymphocytes Absolute: 2.4 10*3/uL (ref 0.7–3.1)
Lymphs: 43 %
MCH: 29 pg (ref 26.6–33.0)
MCHC: 33.8 g/dL (ref 31.5–35.7)
MCV: 86 fL (ref 79–97)
Monocytes Absolute: 0.5 10*3/uL (ref 0.1–0.9)
Monocytes: 10 %
Neutrophils Absolute: 2.4 10*3/uL (ref 1.4–7.0)
Neutrophils: 44 %
Platelets: 271 10*3/uL (ref 150–450)
RBC: 6.13 x10E6/uL — ABNORMAL HIGH (ref 4.14–5.80)
RDW: 12.7 % (ref 11.6–15.4)
WBC: 5.5 10*3/uL (ref 3.4–10.8)

## 2020-07-22 LAB — TESTOSTERONE, FREE, TOTAL, SHBG
Sex Hormone Binding: 29.6 nmol/L (ref 16.5–55.9)
Testosterone, Free: 13.5 pg/mL (ref 6.8–21.5)
Testosterone: 525 ng/dL (ref 264–916)

## 2020-07-22 LAB — INSULIN, FREE AND TOTAL
Free Insulin: 23 uU/mL — ABNORMAL HIGH
Total Insulin: 23 uU/mL

## 2020-07-22 LAB — C-PEPTIDE: C-Peptide: 5 ng/mL — ABNORMAL HIGH (ref 1.1–4.4)

## 2020-07-25 ENCOUNTER — Telehealth (INDEPENDENT_AMBULATORY_CARE_PROVIDER_SITE_OTHER): Payer: 59 | Admitting: "Endocrinology

## 2020-07-25 ENCOUNTER — Encounter: Payer: Self-pay | Admitting: "Endocrinology

## 2020-07-25 VITALS — BP 130/80 | Ht 75.0 in | Wt 275.0 lb

## 2020-07-25 DIAGNOSIS — E291 Testicular hypofunction: Secondary | ICD-10-CM

## 2020-07-25 DIAGNOSIS — R7303 Prediabetes: Secondary | ICD-10-CM

## 2020-07-25 MED ORDER — TESTOSTERONE CYPIONATE 100 MG/ML IJ SOLN: INTRAMUSCULAR | 2 refills | Status: DC

## 2020-07-25 NOTE — Patient Instructions (Signed)
                                       Advice for Weight Management  -For most of [place] the best way to lose weight is by diet management. Generally speaking, diet management means consuming less calories intentionally which over time brings about progressive weight loss.  This can be achieved more effectively by restricting carbohydrate consumption to the minimum possible.  So, it is critically important to know your numbers: how much calorie you are consuming and how much calorie you need. More importantly, our carbohydrates sources should be unprocessed or minimally processed complex starch food items.   Sometimes, it is important to balance nutrition by increasing protein intake (animal or plant source), fruits, and vegetables.  -Sticking to a routine mealtime to eat 3 meals a day and avoiding unnecessary snacks is shown to have a big role in weight control. Under normal circumstances, the only time we lose real weight is when we are hungry, so allow hunger to take place- hunger means no food between meal times, only water.  It is not advisable to starve.   -It is better to avoid simple carbohydrates including: Cakes, Sweet Desserts, Ice Cream, Soda (diet and regular), Sweet Tea, Candies, Chips, Cookies, Store Bought Juices, Alcohol in Excess of  1-2 drinks a day, Artificial Sweeteners, Doughnuts, Coffee Creamers, "Sugar-free" Products, Lemonade, etc, etc.  This is not a complete list...Marland Kitchen.    -Consulting with certified diabetes educators is proven to provide you with the most accurate and current information on diet.  Also, you may be  interested in discussing diet options/exchanges , we can schedule a visit with Paul Parrish, RDN, CDE for individualized nutrition education.  -Exercise: If you are able: 30 -60 minutes a day ,4 days a week, or 150 minutes a week.  The longer the better.  Combine stretch, strength, and aerobic activities.  If you were told in the  past that you have high risk for cardiovascular diseases, you may seek evaluation by your heart doctor prior to initiating moderate to intense exercise programs.

## 2020-07-25 NOTE — Progress Notes (Signed)
07/25/2020       Endocrinology follow-up note   HPI: Paul Parrish is a 49 y.o.-year-old man.  - He is being engaged in telehealth via telephone  for follow-up of hypogonadism.  He was diagnosed with hypogonadism in 2014, etiology unclear.   -Due to polycythemia, he undergoes phlebotomy periodically.   He was advised to lower his testosterone to 100 mg every 10 days.  He underwent phlebotomy at least 1 time since last visit, but it every 56 days. His previsit labs show polycythemia.  His  labs show total testosterone stabilizing at 525.  He reports steady energy level and wishes to continue on testosterone supplement.      -He reports history of seizures as a young man until his teenage years. -He denies any history of head injury.  - He also is recently diagnosed with sleep apnea currently on CPAP.  - He fathers 2 teenage boys biologically. - He has significant mood disorder on multiple antidepressants and antianxiety. No herbal medicines. -He denies family history of premature coronary artery disease.   ROS: Limited as above.  PE: BP 130/80   Ht 6\' 3"  (1.905 m)   Wt 275 lb (124.7 kg)   BMI 34.37 kg/m  Wt Readings from Last 3 Encounters:  07/25/20 275 lb (124.7 kg)  04/20/20 (!) 271 lb 3.2 oz (123 kg)  02/16/20 280 lb 4 oz (127.1 kg)     Recent Results (from the past 2160 hour(s))  Testosterone, Free, Total, SHBG     Status: None   Collection Time: 07/14/20  8:49 AM  Result Value Ref Range   Testosterone 525 264 - 916 ng/dL    Comment: Adult male reference interval is based on a population of healthy nonobese males (BMI <30) between 68 and 24 years old. [place], [place] [phone]. PMID: [id number].    Testosterone, Free 13.5 6.8 - 21.5 pg/mL   Sex Hormone Binding 29.6 16.5 - 55.9 nmol/L  CBC with Differential/Platelet     Status: Abnormal   Collection Time: 07/14/20  8:49 AM  Result Value Ref Range   WBC 5.5 3.4 - 10.8 x10E3/uL   RBC 6.13 (H) 4.14  - 5.80 x10E6/uL   Hemoglobin 17.8 (H) 13.0 - 17.7 g/dL   Hematocrit 52.6 (H) 37.5 - 51.0 %   MCV 86 79 - 97 fL   MCH 29.0 26.6 - 33.0 pg   MCHC 33.8 31 - 35 g/dL   RDW 12.7 11.6 - 15.4 %   Platelets 271 150 - 450 x10E3/uL   Neutrophils 44 Not Estab. %   Lymphs 43 Not Estab. %   Monocytes 10 Not Estab. %   Eos 2 Not Estab. %   Basos 1 Not Estab. %   Neutrophils Absolute 2.4 1.40 - 7.00 x10E3/uL   Lymphocytes Absolute 2.4 0 - 3 x10E3/uL   Monocytes Absolute 0.5 0 - 0 x10E3/uL   EOS (ABSOLUTE) 0.1 0.0 - 0.4 x10E3/uL   Basophils Absolute 0.1 0 - 0 x10E3/uL   Immature Granulocytes 0 Not Estab. %   Immature Grans (Abs) 0.0 0.0 - 0.1 x10E3/uL  Insulin, Free and Total     Status: Abnormal   Collection Time: 07/14/20  8:49 AM  Result Value Ref Range   Free Insulin 23 (H) uU/mL    Comment: Reference Range: Pubertal Children and Adults (fasting): 0 - 17    Total Insulin 23 uU/mL    Comment: Non-Diabetic:  In the absence of insulin-binding antibodies, the free and total  insulin assays are equivalent. However, this assay is intended for use in diabetics with insulin autoantibody present. Measurement is performed on acid-treated samples and, therefore, the sensitivity and absolute values by this method may differ from our direct insulin ICMA. Insulin Dependent Diabetic Patients:  Free Insulin levels vary depending on the capacity and affinity of circulating insulin-binding antibodies and the dose of insulin given to the patient.  Total insulin levels represent free insulin and antibody bound insulin fractions.  This test was developed and its performance characteristics determined by [facility]. It has not been cleared or approved by the Food and Drug Administration.   C-peptide     Status: Abnormal   Collection Time: 07/14/20  8:49 AM  Result Value Ref Range   C-Peptide 5.0 (H) 1.1 - 4.4 ng/mL    Comment: C-Peptide reference interval is for fasting patients.       ASSESSMENT: 1.  Hypogonadism  2. Erectile Dysfunction 3.  Polycythemia  PLAN:  1. Hypogonadism  -He continues to do phlebotomy every 56 days secondary polycythemia.  -More recently his CBC is consistent with polycythemia.  His previsit total testosterone is 525.   He wishes to be continued on testosterone supplement.  He will need a lower dose adjustment.  He is advised to lower his testosterone to 100 mg IM every 14 days , to give him a total monthly dose of 200 mg.   -I had a long discussion with him about safe use of testosterone, to avoid polycythemia.  His history of  secondary polycythemia which continues to require periodic phlebotomy, as well as sleep apnea requiring CPAP treatment making him a poor candidate for more generous testosterone replacement.  He reports less frequent hypoglycemia.  He was was previously diagnosed with prediabetes, reactive hypoglycemia.   - he  admits there is a room for improvement in his diet and drink choices. -  Suggestion is made for him to avoid simple carbohydrates  from his diet including Cakes, Sweet Desserts / Pastries, Ice Cream, Soda (diet and regular), Sweet Tea, Candies, Chips, Cookies, Sweet Pastries,  Store Bought Juices, Alcohol in Excess of  1-2 drinks a day, Artificial Sweeteners, Coffee Creamer, and "Sugar-free" Products. This will help patient to have stable blood glucose profile and potentially avoid unintended weight gain. He will not need work-up for hypoglycemia at this time.  He is advised to continue close follow-up with his PMD for primary care needs.     - Time spent on this patient care encounter:  20 minutes of which 50% was spent in  counseling and the rest reviewing  his current and  previous labs / studies and medications  doses and developing a plan for long term care. [name]  participated in the discussions, expressed understanding, and voiced agreement with the above plans.  All questions were answered to  his satisfaction. he is encouraged to contact clinic should he have any questions or concerns prior to his return visit.    Glade Lloyd, MD Phone: [phone]  Fax: [phone]  -  This note was partially dictated with voice recognition software. Similar sounding words can be transcribed inadequately or may not  be corrected upon review.  07/25/2020, 5:37 PM

## 2020-08-11 ENCOUNTER — Encounter: Payer: Self-pay | Admitting: Family Medicine

## 2020-08-14 ENCOUNTER — Other Ambulatory Visit: Payer: Self-pay

## 2020-08-14 ENCOUNTER — Other Ambulatory Visit: Payer: 59

## 2020-08-14 DIAGNOSIS — R7989 Other specified abnormal findings of blood chemistry: Secondary | ICD-10-CM

## 2020-08-14 DIAGNOSIS — Z13220 Encounter for screening for lipoid disorders: Secondary | ICD-10-CM

## 2020-08-14 LAB — LIPID PANEL
Chol/HDL Ratio: 7 ratio — ABNORMAL HIGH (ref 0.0–5.0)
Cholesterol, Total: 217 mg/dL — ABNORMAL HIGH (ref 100–199)
HDL: 31 mg/dL — ABNORMAL LOW (ref >39)
LDL Chol Calc (NIH): 143 mg/dL — ABNORMAL HIGH (ref 0–99)
Triglycerides: 234 mg/dL — ABNORMAL HIGH (ref 0–149)
VLDL Cholesterol Cal: 43 mg/dL — ABNORMAL HIGH (ref 5–40)

## 2020-08-14 LAB — CMP14+EGFR
ALT: 25 IU/L (ref 0–44)
AST: 22 IU/L (ref 0–40)
Albumin/Globulin Ratio: 1.8 (ref 1.2–2.2)
Albumin: 4.8 g/dL (ref 4.0–5.0)
Alkaline Phosphatase: 77 IU/L (ref 44–121)
BUN/Creatinine Ratio: 13 (ref 9–20)
BUN: 15 mg/dL (ref 6–24)
Bilirubin Total: 0.5 mg/dL (ref 0.0–1.2)
CO2: 22 mmol/L (ref 20–29)
Calcium: 9.3 mg/dL (ref 8.7–10.2)
Chloride: 101 mmol/L (ref 96–106)
Creatinine, Ser: 1.14 mg/dL (ref 0.76–1.27)
GFR calc Af Amer: 87 mL/min/{1.73_m2} (ref >59)
GFR calc non Af Amer: 75 mL/min/{1.73_m2} (ref >59)
Globulin, Total: 2.6 g/dL (ref 1.5–4.5)
Glucose: 92 mg/dL (ref 65–99)
Potassium: 4.2 mmol/L (ref 3.5–5.2)
Sodium: 138 mmol/L (ref 134–144)
Total Protein: 7.4 g/dL (ref 6.0–8.5)

## 2020-08-18 ENCOUNTER — Encounter: Payer: Self-pay | Admitting: Family Medicine

## 2020-08-18 ENCOUNTER — Other Ambulatory Visit: Payer: Self-pay

## 2020-08-18 ENCOUNTER — Ambulatory Visit: Payer: 59 | Admitting: Family Medicine

## 2020-08-18 ENCOUNTER — Ambulatory Visit (INDEPENDENT_AMBULATORY_CARE_PROVIDER_SITE_OTHER): Payer: 59

## 2020-08-18 VITALS — BP 145/91 | HR 75 | Temp 98.0°F | Ht 75.0 in | Wt 291.0 lb

## 2020-08-18 DIAGNOSIS — M5416 Radiculopathy, lumbar region: Secondary | ICD-10-CM

## 2020-08-18 DIAGNOSIS — R030 Elevated blood-pressure reading, without diagnosis of hypertension: Secondary | ICD-10-CM

## 2020-08-18 DIAGNOSIS — M5137 Other intervertebral disc degeneration, lumbosacral region: Secondary | ICD-10-CM

## 2020-08-18 DIAGNOSIS — M5136 Other intervertebral disc degeneration, lumbar region: Secondary | ICD-10-CM

## 2020-08-18 DIAGNOSIS — N522 Drug-induced erectile dysfunction: Secondary | ICD-10-CM | POA: Diagnosis not present

## 2020-08-18 DIAGNOSIS — E781 Pure hyperglyceridemia: Secondary | ICD-10-CM

## 2020-08-18 MED ORDER — TRAMADOL HCL 50 MG PO TABS
50.0000 mg | ORAL_TABLET | Freq: Three times a day (TID) | ORAL | 0 refills | Status: AC | PRN
Start: 2020-08-18 — End: 2020-08-23

## 2020-08-18 MED ORDER — ROSUVASTATIN CALCIUM 10 MG PO TABS: 10.0000 mg | ORAL_TABLET | Freq: Every day | ORAL | 3 refills | Status: DC

## 2020-08-18 MED ORDER — SILDENAFIL CITRATE 20 MG PO TABS: 20.0000 mg | ORAL_TABLET | ORAL | 3 refills | Status: DC | PRN

## 2020-08-18 MED ORDER — METHYLPREDNISOLONE ACETATE 80 MG/ML IJ SUSP
80.0000 mg | Freq: Once | INTRAMUSCULAR | Status: AC
  Administered 2020-08-18: 80 mg via INTRAMUSCULAR

## 2020-08-18 NOTE — Progress Notes (Signed)
BP (!) 145/91   Pulse 75   Temp 98 F (36.7 C)   Ht 6' 3"  (1.905 m)   Wt 291 lb (132 kg)   SpO2 98%   BMI 36.37 kg/m    Subjective:   Patient ID: Paul Parrish, male    DOB: [birth year], 49 y.o.   MRN: [id number]  HPI: Paul Parrish is a 49 y.o. male presenting on 08/18/2020 for Medical Management of Chronic Issues, Edema (bilateral ankle,feet), and Leg Pain (right thigh, radiates to right lower back)   HPI Patient sees urology for hypogonadism and erectile dysfunction and takes sildenafil and testosterone.  Hypertriglyceridemia and hyperlipidemia Patient is coming in for recheck of his hyperlipidemia. The patient is currently taking fish oil. They deny any issues with myalgias or history of liver damage from it. They deny any focal numbness or weakness or chest pain.   Elevated blood pressure Patient is coming in for recheck of blood pressure but is in pain today and says it is elevated because of his pain medicine.  Patient comes in complaining of pain that comes from his lower back around to the front of his right upper thigh.  He says this been going on for a couple weeks and it started out of the blue.  He says the pain is very sharp and severe and he feels like he gives out in that leg sometimes with it.  He says the pain hurts all the way around to the back of his right lower buttock as well and is a shooting and sharp and severe pain.  He says it does keep him up at night sometimes.  He has been using Tylenol and ibuprofen around-the-clock and they do not seem to be touching it.  He feels like it is getting worse.  He says is worse when he is walking and moving around.  He cannot recall any specific trauma or event or lifting anything that would have brought this on him.  Relevant past medical, surgical, family and social history reviewed and updated as indicated. Interim medical history since our last visit reviewed. Allergies and medications reviewed and updated.  Review of  Systems  Constitutional: Negative for chills and fever.  Eyes: Negative for discharge.  Respiratory: Negative for shortness of breath and wheezing.   Cardiovascular: Negative for chest pain and leg swelling.  Musculoskeletal: Positive for arthralgias and back pain. Negative for gait problem.  Skin: Negative for rash.  All other systems reviewed and are negative.   Per HPI unless specifically indicated above   Allergies as of 08/18/2020      Reactions   Amoxicillin Other (See Comments)   High fever and possible heart racing   Bactrim [sulfamethoxazole-trimethoprim] Other (See Comments)   Fever, body aches, chills   Penicillins Other (See Comments)   High fever Has patient had a PCN reaction causing immediate rash, facial/tongue/throat swelling, SOB or lightheadedness with hypotension: No Has patient had a PCN reaction causing severe rash involving mucus membranes or skin necrosis: No Has patient had a PCN reaction that required hospitalization No Has patient had a PCN reaction occurring within the last 10 years: No If all of the above answers are "NO", then may proceed with Cephalosporin use.      Medication List       Accurate as of August 18, 2020  3:26 PM. If you have any questions, ask your nurse or doctor.        clomiPRAMINE 25 MG capsule Commonly known  as: ANAFRANIL Take 1 capsule (25 mg total) by mouth 2 (two) times daily.   gabapentin 300 MG capsule Commonly known as: NEURONTIN Take 1 capsule (300 mg total) by mouth at bedtime.   LORazepam 0.5 MG tablet Commonly known as: Ativan Take one tab daily as needed for anxiety and 2nd if needed again   Melatonin 10 MG Tabs Take by mouth.   sildenafil 20 MG tablet Commonly known as: REVATIO Take 1-5 tablets (20-100 mg total) by mouth as needed. Take 2-5 tabs PRN prior to sexual activity   Skyrizi (150 MG Dose) 75 MG/0.83ML Pskt Generic drug: Risankizumab-rzaa(150 MG Dose) Inject 150 mg into the skin as  directed. Every 12 weeks for maintenance.   SYRINGE-NEEDLE (DISP) 3 ML 21G X 1-1/2" 3 ML Misc Use to inject testosterone every week   Testosterone Cypionate 100 MG/ML Soln Inject 124m ( 165m IM every 14 days.   traZODone 100 MG tablet Commonly known as: DESYREL Take 1 tablet (100 mg total) by mouth at bedtime as needed for sleep.        Objective:   BP (!) 145/91   Pulse 75   Temp 98 F (36.7 C)   Ht 6' 3"  (1.905 m)   Wt 291 lb (132 kg)   SpO2 98%   BMI 36.37 kg/m   Wt Readings from Last 3 Encounters:  08/18/20 291 lb (132 kg)  07/25/20 275 lb (124.7 kg)  04/20/20 (!) 271 lb 3.2 oz (123 kg)    Physical Exam Vitals and nursing note reviewed.  Constitutional:      General: He is not in acute distress.    Appearance: He is well-developed. He is not diaphoretic.  Eyes:     General: No scleral icterus.    Conjunctiva/sclera: Conjunctivae normal.  Neck:     Thyroid: No thyromegaly.  Cardiovascular:     Rate and Rhythm: Normal rate and regular rhythm.     Heart sounds: Normal heart sounds. No murmur heard.   Pulmonary:     Effort: Pulmonary effort is normal. No respiratory distress.     Breath sounds: Normal breath sounds. No wheezing.  Musculoskeletal:        General: Swelling (Trace bilateral lower extremity edema) present.     Cervical back: Neck supple.     Lumbar back: No deformity, spasms, tenderness or bony tenderness. Normal range of motion. Positive right straight leg raise test.  Lymphadenopathy:     Cervical: No cervical adenopathy.  Skin:    General: Skin is warm and dry.     Findings: No rash.  Neurological:     Mental Status: He is alert and oriented to person, place, and time.     Coordination: Coordination normal.  Psychiatric:        Behavior: Behavior normal.     Results for orders placed or performed in visit on 08/14/20  CMP14+EGFR  Result Value Ref Range   Glucose 92 65 - 99 mg/dL   BUN 15 6 - 24 mg/dL   Creatinine, Ser 1.14 0.76 -  1.27 mg/dL   GFR calc non Af Amer 75 >59 mL/min/1.73   GFR calc Af Amer 87 >59 mL/min/1.73   BUN/Creatinine Ratio 13 9 - 20   Sodium 138 134 - 144 mmol/L   Potassium 4.2 3.5 - 5.2 mmol/L   Chloride 101 96 - 106 mmol/L   CO2 22 20 - 29 mmol/L   Calcium 9.3 8.7 - 10.2 mg/dL   Total Protein 7.4 6.0 -  8.5 g/dL   Albumin 4.8 4.0 - 5.0 g/dL   Globulin, Total 2.6 1.5 - 4.5 g/dL   Albumin/Globulin Ratio 1.8 1.2 - 2.2   Bilirubin Total 0.5 0.0 - 1.2 mg/dL   Alkaline Phosphatase 77 44 - 121 IU/L   AST 22 0 - 40 IU/L   ALT 25 0 - 44 IU/L  Lipid panel  Result Value Ref Range   Cholesterol, Total 217 (H) 100 - 199 mg/dL   Triglycerides 234 (H) 0 - 149 mg/dL   HDL 31 (L) >39 mg/dL   VLDL Cholesterol Cal 43 (H) 5 - 40 mg/dL   LDL Chol Calc (NIH) 143 (H) 0 - 99 mg/dL   Chol/HDL Ratio 7.0 (H) 0.0 - 5.0 ratio    Assessment & Plan:   Problem List Items Addressed This Visit      Other   Erectile dysfunction   Relevant Medications   sildenafil (REVATIO) 20 MG tablet   Elevated blood pressure reading without diagnosis of hypertension   Hypertriglyceridemia - Primary   Relevant Medications   sildenafil (REVATIO) 20 MG tablet   rosuvastatin (CRESTOR) 10 MG tablet    Other Visit Diagnoses    Lumbar radicular pain       Relevant Medications   methylPREDNISolone acetate (DEPO-MEDROL) injection 80 mg (Start on 08/18/2020  4:00 PM)   Other Relevant Orders   DG Lumbar Spine 2-3 Views      Will give steroid injection and do x-ray.  Await final read from radiologist for x-ray.  Will start Crestor, continue fish oils.  If x-ray shows anything or if he is not improved after injection then may consider MRI in the future. Follow up plan: Return in about 6 months (around 02/15/2021), or if symptoms worsen or fail to improve, for Hypertension and hyperlipidemia.  Counseling provided for all of the vaccine components No orders of the defined types were placed in this encounter.   [name], MD [facility] 08/18/2020, 3:26 PM

## 2020-08-21 ENCOUNTER — Encounter: Payer: Self-pay | Admitting: Family Medicine

## 2020-08-21 NOTE — Progress Notes (Unsigned)
Please let the patient know that I placed an MRI order for him because of the abnormal x-ray and the continued pain, I do not unfortunately have anything stronger than the steroids that we have already tried.  But I have ordered the MRI to see if we can figure out with going on here.

## 2020-08-30 ENCOUNTER — Other Ambulatory Visit: Payer: Self-pay

## 2020-08-30 ENCOUNTER — Encounter: Payer: Self-pay | Admitting: Family Medicine

## 2020-08-30 ENCOUNTER — Ambulatory Visit: Payer: 59 | Admitting: Family Medicine

## 2020-08-30 VITALS — BP 137/86 | HR 91 | Temp 98.2°F | Ht 75.0 in | Wt 291.0 lb

## 2020-08-30 DIAGNOSIS — M5416 Radiculopathy, lumbar region: Secondary | ICD-10-CM | POA: Diagnosis not present

## 2020-08-30 MED ORDER — HYDROCODONE-ACETAMINOPHEN 7.5-325 MG PO TABS: 1.0000 | ORAL_TABLET | Freq: Two times a day (BID) | ORAL | 0 refills | Status: DC | PRN

## 2020-08-30 NOTE — Progress Notes (Signed)
BP 137/86   Pulse 91   Temp 98.2 F (36.8 C)   Ht 6\' 3"  (1.905 m)   Wt 291 lb (132 kg)   SpO2 98%   BMI 36.37 kg/m    Subjective:   Patient ID: Paul Parrish, male    DOB: Sep 27, 1971, 49 y.o.   MRN: [id number]  HPI: Paul Parrish is a 49 y.o. male presenting on 08/30/2020 for Back Pain   HPI Patient is coming in for continued back pain, now it is going bilateral but is still shoots down his right leg down to his lower leg.  He says it is worse with movement and worse when he bends.  He tried some tramadol and steroid injection and has been using heating pads and TENS units and they do not seem to be helping and it has continued to stay the same or be slightly worse.  Patient says the pain shoots down his right leg down to the back of his right lower leg.  He denies any numbness or weakness.  Relevant past medical, surgical, family and social history reviewed and updated as indicated. Interim medical history since our last visit reviewed. Allergies and medications reviewed and updated.  Review of Systems  Constitutional: Negative for chills and fever.  Respiratory: Negative for shortness of breath and wheezing.   Cardiovascular: Negative for chest pain and leg swelling.  Musculoskeletal: Positive for back pain and myalgias. Negative for gait problem.  Skin: Negative for rash.  Neurological: Negative for weakness and numbness.  All other systems reviewed and are negative.   Per HPI unless specifically indicated above      Objective:   BP 137/86   Pulse 91   Temp 98.2 F (36.8 C)   Ht 6\' 3"  (1.905 m)   Wt 291 lb (132 kg)   SpO2 98%   BMI 36.37 kg/m   Wt Readings from Last 3 Encounters:  08/30/20 291 lb (132 kg)  08/18/20 291 lb (132 kg)  07/25/20 275 lb (124.7 kg)    Physical Exam Vitals and nursing note reviewed.  Constitutional:      General: He is not in acute distress.    Appearance: He is well-developed. He is not diaphoretic.  Eyes:     General: No  scleral icterus.    Conjunctiva/sclera: Conjunctivae normal.  Neck:     Thyroid: No thyromegaly.  Musculoskeletal:        General: Normal range of motion.     Lumbar back: Spasms and tenderness present. No bony tenderness. Normal range of motion.       Back:  Neurological:     Mental Status: He is alert and oriented to person, place, and time.     Coordination: Coordination normal.  Psychiatric:        Behavior: Behavior normal.       Assessment & Plan:   Problem List Items Addressed This Visit    None    Visit Diagnoses    Lumbar radicular pain    -  Primary   Relevant Orders   Ambulatory referral to Physical Therapy      Steroids and tramadol did not help, he continues to use a TENS unit and heating pads.  Follow-up in 3 to 4 weeks after PT  Had tried to order the MRI but was not approved by insurance, will do physical therapy and then if not improving will reorder MRI at next visit Follow up plan: Return if symptoms worsen or fail to  improve, for 3 to 4-week back pain follow-up after physical therapy.  Counseling provided for all of the vaccine components No orders of the defined types were placed in this encounter.   [name], MD [facility] 08/30/2020, 2:32 PM

## 2020-08-31 ENCOUNTER — Encounter: Payer: Self-pay | Admitting: Family Medicine

## 2020-09-07 ENCOUNTER — Encounter: Payer: Self-pay | Admitting: Physical Therapy

## 2020-09-07 ENCOUNTER — Ambulatory Visit: Payer: 59 | Attending: Family Medicine | Admitting: Physical Therapy

## 2020-09-07 ENCOUNTER — Other Ambulatory Visit: Payer: Self-pay

## 2020-09-07 DIAGNOSIS — M5441 Lumbago with sciatica, right side: Secondary | ICD-10-CM | POA: Insufficient documentation

## 2020-09-07 NOTE — Therapy (Signed)
[facility] [place], [place], [address] Phone: [phone]   Fax:  [phone]  Physical Therapy Evaluation  Patient Details  Name: Paul Parrish MRN: [id number] Date of Birth: 10-14-1970 Referring Provider (PT): [name] MD   Encounter Date: 09/07/2020   PT End of Session - 09/07/20 1546    Visit Number 1    Number of Visits 8    Date for PT Re-Evaluation 10/05/20    Authorization Type FOTO.    PT Start Time 0217    PT Stop Time 0319    PT Time Calculation (min) 62 min    Activity Tolerance Patient tolerated treatment well    Behavior During Therapy WFL for tasks assessed/performed           Past Medical History:  Diagnosis Date  . Anxiety   . Depression   . Elevated hemoglobin ([place])   . Low testosterone   . Small bowel obstruction ([place]) 10/21/2016    Past Surgical History:  Procedure Laterality Date  . NASAL SINUS SURGERY  02/2016    There were no vitals filed for this visit.    Subjective Assessment - 09/07/20 1509    Subjective COVID-19 screen performed prior to patient entering clinic.  The patient presents to the clinic today with c/o right sided low back pain radiating into his thigh (anterior and posterior) to about the level of his knee.  He states he first noticed it after sitting for a long time at a football game and then walking to his car.  He states the pain became severe.  Today, he rates his pain at an 8/10 increasing with bending, squatting and reaching.  Pain medication helps some to decrease his pain.    Limitations Sitting;Standing    How long can you sit comfortably? Short time.    How long can you stand comfortably? Short time.    How long can you walk comfortably? Short community distances.    Diagnostic tests X-ray.    Patient Stated Goals Get out of pain.    Currently in Pain? Yes    Pain Score 8     Pain Location Back   Right LE.   Pain Orientation Right    Pain  Descriptors / Indicators Sore;Sharp    Pain Type Acute pain    Pain Onset More than a month ago    Pain Frequency Constant    Aggravating Factors  See above.    Pain Relieving Factors See above.              [facility] PT Assessment - 09/07/20 0001      Assessment   Medical Diagnosis Lumbar radicular pain    Referring Provider (PT) Vonna Kotyk Dettinger MD    Onset Date/Surgical Date --   September 2021.     Precautions   Precautions None      Restrictions   Weight Bearing Restrictions No      Balance Screen   Has the patient fallen in the past 6 months No    Has the patient had a decrease in activity level because of a fear of falling?  No    Is the patient reluctant to leave their home because of a fear of falling?  No      Home Ec[employer] residence      Prior Function   Level of Independence Independent      Posture/Postural Control   Posture/Postural Control No significant  limitations      Deep Tendon Reflexes   DTR Assessment Site Patella;Achilles    Patella DTR 2+    Achilles DTR 1+      ROM / Strength   AROM / PROM / Strength AROM;Strength      AROM   Overall AROM Comments 20 degrees of active lumbar extension and active lumbar flexion limited by 25%.      Strength   Overall Strength Comments Normal LE strength.      Palpation   Palpation comment tender to palpation over right low back/QL and right SIJ.      Special Tests   Other special tests Equal leg lengths.  Positive right SLR and FABER test.      Transfers   Comments Performed slowly using the log roll technique.      Ambulation/Gait   Gait Comments Essentially normal.                      Objective measurements completed on examination: See above findings.       [facility] Adult PT Treatment/Exercise - 09/07/20 0001      Modalities   Modalities Moist Heat;Electrical Stimulation;Ultrasound      Moist Heat Therapy   Number Minutes Moist Heat 20  Minutes    Moist Heat Location Lumbar Spine      Electrical Stimulation   Electrical Stimulation Location RT low back.    Electrical Stimulation Action IFC at 80-150 Hz x 20 minutes at 40% scan    Electrical Stimulation Goals Pain      Ultrasound   Ultrasound Location RT low back.    Ultrasound Parameters Combo e'stim/US at 1.50 W/CM2 x 10 minutes.      Manual Therapy   Manual Therapy Soft tissue mobilization    Soft tissue mobilization STW/M x 5 minutes with right QL release technique.                       PT Long Term Goals - 09/07/20 1609      PT LONG TERM GOAL #1   Title Independent with a HEP.    Time 4    Period Weeks    Status New      PT LONG TERM GOAL #2   Title Sit 30 minutes with pain not > 3/10.    Time 4    Period Weeks    Status New      PT LONG TERM GOAL #3   Title Stand 20 minutes with pain not > 3/10.    Time 4    Period Weeks    Status New      PT LONG TERM GOAL #4   Title Eliminate right LE symptoms.    Time 4    Period Weeks    Status New      PT LONG TERM GOAL #5   Title Perform ADL's with pain not > 3/10.    Time 4    Period Weeks    Status New                  Plan - 09/07/20 1547    Clinical Impression Statement The patient presents to [facility] with c/o right sided low back pain with radiation into his right anterior and posterior thigh regions to the level of his knee this September of this year.  He has minimal losses of lumbar flexion and extension.  He is quite tender in his  right low back specifically his QL and also in the area of his right SIJ.  He demonstrated a positive right SLR and FABER test.    Patient will benefit from skilled physical therapy intervention to address deficits and pain.    Personal Factors and Comorbidities Other    Examination-Activity Limitations Other;Sit;Stand;Transfers    Examination-Participation Restrictions Other    Stability/Clinical Decision Making Evolving/Moderate complexity     Clinical Decision Making Low    Rehab Potential Good    PT Frequency 2x / week    PT Duration 4 weeks    PT Treatment/Interventions ADLs/Self Care Home Management;Cryotherapy;Electrical Stimulation;Ultrasound;Traction;Moist Heat;Functional mobility training;Therapeutic activities;Therapeutic exercise;Manual techniques;Patient/family education;Passive range of motion;Dry needling;Joint Manipulations;Spinal Manipulations    PT Next Visit Plan Combo e'stim/US and STW/M to patient's right low back and SIJ, SKTC, hip bridges.  Core exercise progression.  May consider intermittment lumbar traction beginning at 80#.    Consulted and Agree with Plan of Care Patient           Patient will benefit from skilled therapeutic intervention in order to improve the following deficits and impairments:  Pain,Decreased activity tolerance,Increased muscle spasms,Decreased range of motion,Improper body mechanics  Visit Diagnosis: Acute right-sided low back pain with right-sided sciatica - Plan: PT plan of care cert/re-cert     Problem List Patient Active Problem List   Diagnosis Date Noted  . Hypoglycemia 04/20/2020  . Hypertriglyceridemia 10/22/2017  . Class 1 obesity due to excess calories with serious comorbidity and body mass index (BMI) of 33.0 to 33.9 in adult 02/06/2017  . SBO (small bowel obstruction) ([place]) 10/21/2016  . Small bowel obstruction ([place]) 10/21/2016  . Psoriasis 02/12/2016  . Anxiety 10/06/2015  . Erectile dysfunction 05/02/2015  . Elevated blood pressure reading without diagnosis of hypertension 05/02/2015  . Polycythemia, secondary 06/25/2014  . Hypogonadism in male 12/01/2013  . Depression 01/07/2013    Ardine Iacovelli, [place] MPT 09/07/2020, 4:17 PM  [facility] [address] [place], [place], [address] Phone: [phone]   Fax:  [phone]  Name: Dyron Kawano MRN: [id number] Date of Birth: 20-May-1971

## 2020-09-14 ENCOUNTER — Other Ambulatory Visit: Payer: Self-pay

## 2020-09-14 ENCOUNTER — Ambulatory Visit: Payer: 59 | Admitting: *Deleted

## 2020-09-14 DIAGNOSIS — M5441 Lumbago with sciatica, right side: Secondary | ICD-10-CM | POA: Diagnosis not present

## 2020-09-14 NOTE — Therapy (Signed)
[facility] [place], [place], [address] Phone: [phone]   Fax:  [phone]  Physical Therapy Treatment  Patient Details  Name: Paul Parrish MRN: [id number] Date of Birth: [birth year] Referring Provider (PT): [name] MD   Encounter Date: 09/14/2020   PT End of Session - 09/14/20 1533    Visit Number 2    Number of Visits 8    Date for PT Re-Evaluation 10/05/20    Authorization Type FOTO.    PT Start Time 19           Past Medical History:  Diagnosis Date  . Anxiety   . Depression   . Elevated hemoglobin ([place])   . Low testosterone   . Small bowel obstruction ([place]) 10/21/2016    Past Surgical History:  Procedure Laterality Date  . NASAL SINUS SURGERY  02/2016    There were no vitals filed for this visit.   Subjective Assessment - 09/14/20 1528    Subjective COVID-19 screen performed prior to patient entering clinic.   Pain 7/10 RT leg pain    Limitations Sitting;Standing    How long can you sit comfortably? Short time.    How long can you stand comfortably? Short time.    How long can you walk comfortably? Short community distances.    Diagnostic tests X-ray.    Patient Stated Goals Get out of pain.    Currently in Pain? Yes    Pain Score 7     Pain Location Back                             [facility] Adult PT Treatment/Exercise - 09/14/20 0001      Exercises   Exercises Lumbar      Lumbar Exercises: Standing   Other Standing Lumbar Exercises extension in standing x 10      Lumbar Exercises: Prone   Other Prone Lumbar Exercises prone progression from prone position RT LE symptoms no worse, Prone on elbows no worse, and progression to 2x10 press-ups no worse with only mild LBP soreness.      Modalities   Modalities Moist Heat;Electrical Stimulation;Ultrasound      Moist Heat Therapy   Number Minutes Moist Heat 15 Minutes    Moist Heat Location Lumbar Spine       Ultrasound   Ultrasound Location RT SIJ  prone    Ultrasound Parameters combo [place] /estim 1.5 w/cm2 x 10 mins    Ultrasound Goals Pain      Manual Therapy   Manual Therapy Soft tissue mobilization    Soft tissue mobilization STW to LB and RT SIJ prone pos.           SOC x 10 performed              PT Long Term Goals - 09/07/20 1609      PT LONG TERM GOAL #1   Title Independent with a HEP.    Time 4    Period Weeks    Status New      PT LONG TERM GOAL #2   Title Sit 30 minutes with pain not > 3/10.    Time 4    Period Weeks    Status New      PT LONG TERM GOAL #3   Title Stand 20 minutes with pain not > 3/10.    Time 4    Period Weeks  Status New      PT LONG TERM GOAL #4   Title Eliminate right LE symptoms.    Time 4    Period Weeks    Status New      PT LONG TERM GOAL #5   Title Perform ADL's with pain not > 3/10.    Time 4    Period Weeks    Status New                 Plan - 09/14/20 1839    Clinical Impression Statement Pt arrived today doing fair with some RT thigh pain. He reports that he feels better when lying prone. Prone progression was performed and tolerated well with no increase in RT LE symptoms. EIS was also performed, but with mild increase in RT LE symptoms. SOC performed x 10 and tolerated well. [place] combo performed over RT SIJ in prone was tolerated with normal response.           Patient will benefit from skilled therapeutic intervention in order to improve the following deficits and impairments:     Visit Diagnosis: Acute right-sided low back pain with right-sided sciatica     Problem List Patient Active Problem List   Diagnosis Date Noted  . Hypoglycemia 04/20/2020  . Hypertriglyceridemia 10/22/2017  . Class 1 obesity due to excess calories with serious comorbidity and body mass index (BMI) of 33.0 to 33.9 in adult 02/06/2017  . SBO (small bowel obstruction) ([place]) 10/21/2016  . Small bowel obstruction ([place])  10/21/2016  . Psoriasis 02/12/2016  . Anxiety 10/06/2015  . Erectile dysfunction 05/02/2015  . Elevated blood pressure reading without diagnosis of hypertension 05/02/2015  . Polycythemia, secondary 06/25/2014  . Hypogonadism in male 12/01/2013  . Depression 01/07/2013    Raziyah Vanvleck,CHRIS, PTA 09/14/2020, 6:43 PM  [facility] [address] [place], [place], [address] Phone: [phone]   Fax:  [phone]  Name: Paul Parrish MRN: [id number] Date of Birth: 1971-03-26

## 2020-09-19 ENCOUNTER — Other Ambulatory Visit: Payer: Self-pay

## 2020-09-19 ENCOUNTER — Ambulatory Visit: Payer: 59 | Admitting: *Deleted

## 2020-09-19 DIAGNOSIS — M5441 Lumbago with sciatica, right side: Secondary | ICD-10-CM

## 2020-09-19 NOTE — Therapy (Signed)
[facility] [place], [place], [address] Phone: [phone]   Fax:  [phone]  Physical Therapy Treatment  Patient Details  Name: Paul Parrish MRN: [id number] Date of Birth: [birth year] Referring Provider (PT): [name] MD   Encounter Date: 09/19/2020   PT End of Session - 09/19/20 1017    Visit Number 3    Number of Visits 8    Date for PT Re-Evaluation 10/05/20    Authorization Type FOTO.    PT Start Time 1430    PT Stop Time 1520    PT Time Calculation (min) 50 min           Past Medical History:  Diagnosis Date  . Anxiety   . Depression   . Elevated hemoglobin ([place])   . Low testosterone   . Small bowel obstruction ([place]) 10/21/2016    Past Surgical History:  Procedure Laterality Date  . NASAL SINUS SURGERY  02/2016    There were no vitals filed for this visit.   Subjective Assessment - 09/19/20 1431    Subjective COVID-19 screen performed prior to patient entering clinic.   Pain 7/10 RT leg pain                             [facility] Adult PT Treatment/Exercise - 09/19/20 0001      Exercises   Exercises Lumbar      Modalities   Modalities Moist Heat;Electrical Stimulation;Ultrasound;Traction      Electrical Stimulation   Electrical Stimulation Location RT low back.    Electrical Stimulation Action premod x 10 mins RT SIJ/ LB    Electrical Stimulation Parameters 80-150hz  x 10 mins    Electrical Stimulation Goals Pain      Ultrasound   Ultrasound Location RT SIJ    Ultrasound Parameters combo 1.5 w/cm2 x 10 mins    Ultrasound Goals Pain      Traction   Type of Traction Lumbar    Min (lbs) 10    Max (lbs) 80    Hold Time 99    Rest Time 10    Time 15 mins      Manual Therapy   Manual Therapy Soft tissue mobilization    Soft tissue mobilization x 5 mins, but stopped due to pain in RT glute                       PT Long Term Goals - 09/07/20 1609       PT LONG TERM GOAL #1   Title Independent with a HEP.    Time 4    Period Weeks    Status New      PT LONG TERM GOAL #2   Title Sit 30 minutes with pain not > 3/10.    Time 4    Period Weeks    Status New      PT LONG TERM GOAL #3   Title Stand 20 minutes with pain not > 3/10.    Time 4    Period Weeks    Status New      PT LONG TERM GOAL #4   Title Eliminate right LE symptoms.    Time 4    Period Weeks    Status New      PT LONG TERM GOAL #5   Title Perform ADL's with pain not > 3/10.    Time 4  Period Weeks    Status New                 Plan - 09/19/20 1457    Clinical Impression Statement Pt arrived today reporting no better with exs and stopeed due still having RT sided pain into glute/leg. [place] combo performed  to RT SIJ f/b x 5 mins, DC due to increased pain in RT glute. Estim added to RT side SIJ and LB paras in prone with decreased LE pain. Pelvic traction performed at 803s today    Personal Factors and Comorbidities Other    Examination-Activity Limitations Other;Sit;Stand;Transfers    Stability/Clinical Decision Making Evolving/Moderate complexity    Rehab Potential Good    PT Frequency 2x / week    PT Duration 4 weeks    PT Treatment/Interventions ADLs/Self Care Home Management;Cryotherapy;Electrical Stimulation;Ultrasound;Traction;Moist Heat;Functional mobility training;Therapeutic activities;Therapeutic exercise;Manual techniques;Patient/family education;Passive range of motion;Dry needling;Joint Manipulations;Spinal Manipulations    PT Next Visit Plan Combo e'stim/US and STW/M to patient's right low back and SIJ, SKTC, hip bridges.  Core exercise progression.  May consider intermittment lumbar traction beginning at 80#.           Patient will benefit from skilled therapeutic intervention in order to improve the following deficits and impairments:  Pain,Decreased activity tolerance,Increased muscle spasms,Decreased range of motion,Improper body  mechanics  Visit Diagnosis: Acute right-sided low back pain with right-sided sciatica     Problem List Patient Active Problem List   Diagnosis Date Noted  . Hypoglycemia 04/20/2020  . Hypertriglyceridemia 10/22/2017  . Class 1 obesity due to excess calories with serious comorbidity and body mass index (BMI) of 33.0 to 33.9 in adult 02/06/2017  . SBO (small bowel obstruction) ([facility]) 10/21/2016  . Small bowel obstruction ([facility]) 10/21/2016  . Psoriasis 02/12/2016  . Anxiety 10/06/2015  . Erectile dysfunction 05/02/2015  . Elevated blood pressure reading without diagnosis of hypertension 05/02/2015  . Polycythemia, secondary 06/25/2014  . Hypogonadism in male 12/01/2013  . Depression 01/07/2013    Adham Johnson,CHRIS, PTA 09/19/2020, 5:55 PM  [facility] [address] [place], [place], [address] Phone: [phone]   Fax:  [phone]  Name: Paul Parrish MRN: [id number] Date of Birth: 1970/12/07

## 2020-09-20 ENCOUNTER — Ambulatory Visit: Payer: 59 | Admitting: Physical Therapy

## 2020-09-20 ENCOUNTER — Encounter: Payer: Self-pay | Admitting: Family Medicine

## 2020-09-26 ENCOUNTER — Ambulatory Visit: Payer: 59 | Admitting: *Deleted

## 2020-09-26 ENCOUNTER — Other Ambulatory Visit: Payer: Self-pay

## 2020-09-26 DIAGNOSIS — M5441 Lumbago with sciatica, right side: Secondary | ICD-10-CM | POA: Diagnosis not present

## 2020-09-26 NOTE — Therapy (Signed)
[facility] [place], [place], [address] Phone: [phone]   Fax:  [phone]  Physical Therapy Treatment  Patient Details  Name: Paul Parrish MRN: [id number] Date of Birth: 06-07-1971 Referring Provider (PT): [name] MD   Encounter Date: 09/26/2020   PT End of Session - 09/26/20 6256    Visit Number 4    Number of Visits 8    Date for PT Re-Evaluation 10/05/20    Authorization Type FOTO.    PT Start Time 1430    PT Stop Time 1520    PT Time Calculation (min) 50 min           Past Medical History:  Diagnosis Date  . Anxiety   . Depression   . Elevated hemoglobin ([place])   . Low testosterone   . Small bowel obstruction ([place]) 10/21/2016    Past Surgical History:  Procedure Laterality Date  . NASAL SINUS SURGERY  02/2016    There were no vitals filed for this visit.   Subjective Assessment - 09/26/20 1514    Subjective COVID-19 screen performed prior to patient entering clinic.   Pain 6- 7/10 RT leg pain. My back was very sore after Traction    Limitations Sitting;Standing    How long can you stand comfortably? Short time.    How long can you walk comfortably? Short community distances.    Diagnostic tests X-ray.    Patient Stated Goals Get out of pain.    Currently in Pain? Yes    Pain Score 7     Pain Location Back    Pain Orientation Right    Pain Descriptors / Indicators Sore;Shooting   with each step on RT side   Pain Type Acute pain                             [facility] Adult PT Treatment/Exercise - 09/26/20 0001      Exercises   Exercises Lumbar      Lumbar Exercises: Supine   Bridge 10 reps;3 seconds    Other Supine Lumbar Exercises Hooklying HS MET hips and knees at 90 degrees    Other Supine Lumbar Exercises MET for hip flexion and adduction performed  but both increased pain RT side.      Modalities   Modalities Moist Heat;Electrical  Stimulation;Ultrasound;Traction      Moist Heat Therapy   Number Minutes Moist Heat 15 Minutes    Moist Heat Location Lumbar Spine      Electrical Stimulation   Electrical Stimulation Location RT low back.    Electrical Stimulation Action Premod x15 mins    Electrical Stimulation Parameters 80-150 hz x 15 min    Electrical Stimulation Goals Pain      Ultrasound   Ultrasound Location RT SIJ    Ultrasound Parameters combo 1.5 w/cm2 x 12 mins    Ultrasound Goals Pain      Manual Therapy   Manual Therapy Soft tissue mobilization    Soft tissue mobilization x 6 mins, to RT SIJ                       PT Long Term Goals - 09/07/20 1609      PT LONG TERM GOAL #1   Title Independent with a HEP.    Time 4    Period Weeks    Status New      PT LONG  TERM GOAL #2   Title Sit 30 minutes with pain not > 3/10.    Time 4    Period Weeks    Status New      PT LONG TERM GOAL #3   Title Stand 20 minutes with pain not > 3/10.    Time 4    Period Weeks    Status New      PT LONG TERM GOAL #4   Title Eliminate right LE symptoms.    Time 4    Period Weeks    Status New      PT LONG TERM GOAL #5   Title Perform ADL's with pain not > 3/10.    Time 4    Period Weeks    Status New                 Plan - 09/26/20 1613    Clinical Impression Statement Pt arrived today doing about the same, but was very sore after Traction so traction was not performed today. Rx focused on RT SIJ with MET's, [place] combo, as well as STW. Pt to assess MET exs and if no better call MD office.    Examination-Activity Limitations Other;Sit;Stand;Transfers    Examination-Participation Restrictions Other    Stability/Clinical Decision Making Evolving/Moderate complexity    Rehab Potential Good    PT Frequency 2x / week    PT Treatment/Interventions ADLs/Self Care Home Management;Cryotherapy;Electrical Stimulation;Ultrasound;Traction;Moist Heat;Functional mobility training;Therapeutic  activities;Therapeutic exercise;Manual techniques;Patient/family education;Passive range of motion;Dry needling;Joint Manipulations;Spinal Manipulations    PT Next Visit Plan Pt to call PT and/or MD office about status           Patient will benefit from skilled therapeutic intervention in order to improve the following deficits and impairments:  Pain,Decreased activity tolerance,Increased muscle spasms,Decreased range of motion,Improper body mechanics  Visit Diagnosis: Acute right-sided low back pain with right-sided sciatica     Problem List Patient Active Problem List   Diagnosis Date Noted  . Hypoglycemia 04/20/2020  . Hypertriglyceridemia 10/22/2017  . Class 1 obesity due to excess calories with serious comorbidity and body mass index (BMI) of 33.0 to 33.9 in adult 02/06/2017  . SBO (small bowel obstruction) ([place]) 10/21/2016  . Small bowel obstruction ([place]) 10/21/2016  . Psoriasis 02/12/2016  . Anxiety 10/06/2015  . Erectile dysfunction 05/02/2015  . Elevated blood pressure reading without diagnosis of hypertension 05/02/2015  . Polycythemia, secondary 06/25/2014  . Hypogonadism in male 12/01/2013  . Depression 01/07/2013    Raven Furnas,CHRIS, PTA 09/26/2020, 4:21 PM  [facility] [address] [place], [place], [address] Phone: [phone]   Fax:  [phone]  Name: Paul Parrish MRN: [id number] Date of Birth: 1971-08-20

## 2020-09-27 ENCOUNTER — Encounter: Payer: Self-pay | Admitting: Family Medicine

## 2020-09-28 ENCOUNTER — Ambulatory Visit: Payer: 59 | Admitting: Family Medicine

## 2020-10-04 ENCOUNTER — Encounter: Payer: Self-pay | Admitting: Family Medicine

## 2020-10-04 ENCOUNTER — Encounter (HOSPITAL_COMMUNITY): Payer: Self-pay | Admitting: Psychiatry

## 2020-10-04 ENCOUNTER — Other Ambulatory Visit: Payer: Self-pay

## 2020-10-04 ENCOUNTER — Telehealth (INDEPENDENT_AMBULATORY_CARE_PROVIDER_SITE_OTHER): Payer: 59 | Admitting: Psychiatry

## 2020-10-04 DIAGNOSIS — F410 Panic disorder [episodic paroxysmal anxiety] without agoraphobia: Secondary | ICD-10-CM

## 2020-10-04 DIAGNOSIS — F4010 Social phobia, unspecified: Secondary | ICD-10-CM

## 2020-10-04 DIAGNOSIS — F331 Major depressive disorder, recurrent, moderate: Secondary | ICD-10-CM

## 2020-10-04 DIAGNOSIS — F411 Generalized anxiety disorder: Secondary | ICD-10-CM

## 2020-10-04 MED ORDER — CLOMIPRAMINE HCL 25 MG PO CAPS: 25.0000 mg | ORAL_CAPSULE | Freq: Two times a day (BID) | ORAL | 2 refills | Status: DC

## 2020-10-04 MED ORDER — LORAZEPAM 0.5 MG PO TABS: ORAL_TABLET | ORAL | 2 refills | Status: DC

## 2020-10-04 MED ORDER — GABAPENTIN 300 MG PO CAPS: 300.0000 mg | ORAL_CAPSULE | Freq: Every day | ORAL | 2 refills | Status: DC

## 2020-10-04 MED ORDER — TRAZODONE HCL 100 MG PO TABS: 100.0000 mg | ORAL_TABLET | Freq: Every evening | ORAL | 2 refills | Status: DC | PRN

## 2020-10-04 NOTE — Progress Notes (Signed)
Virtual Visit via Telephone Note  I connected with Paul Parrish on 10/04/20 at  9:20 AM EST by telephone and verified that I am speaking with the correct person using two identifiers.  Location: Patient: Home Provider: Home Office   I discussed the limitations, risks, security and privacy concerns of performing an evaluation and management service by telephone and the availability of in person appointments. I also discussed with the patient that there may be a patient responsible charge related to this service. The patient expressed understanding and agreed to proceed.   History of Present Illness: Patient is evaluated by phone session.  He is on the phone by himself.  Patient told [name] was quite as he does not go to many places but able to see his brother.  He endorsed lately more stress because of his back pain.  He is supposed to have an MRI and he is a still waiting to be done.  He is not sure what is happening with his back but lately he needed some time cane to walk.  He still have residual anxiety especially when he go outside.  He is working from 5 AM to 2 PM from home.  Denies any crying spells, feeling of hopelessness or worthlessness.  Denies any major panic attack.  He usually take second Ativan when he go to grocery store.  He still feels nervous and overwhelmed with crowded area.  However he denies any suicidal thoughts or homicidal thoughts.  He had a good support from his wife.  Lately his testosterone dose was cut down because of high hemoglobin and now he is taking injection twice a month.  He denies drinking or using any illegal substances.  He admitted weight gain for past few months because of lack of mobilization and back pain.  He is hoping once his back pain resolved he may start walking every day.  However he start watching his diet.  He denies any tremors, shakes or any EPS.  He like to keep his current medication.    PastPsychiatric History: H/Oanxiety, depression  and sawpsychiatrist at neuropsychiatry in [place] but not happy with the treatment. Saw Dr. Daron Offer in 2018 and prescribed Prozac, Xanax, Effexor, Celexa, propanolol, Lamictal, Vistaril, Adderall,Wellbutrin,Cymbalta(increased irritability),Anafranil andLuvox (did not help).H/Oof ECT with no response. No h/osuicidal attempt or inpatient.At [facility] tried [place], Klonopin but did not work. H/Oheavy drinking but claims to be sober.  Psychiatric Specialty Exam: Physical Exam  Review of Systems  Weight 292 lb (132.5 kg).There is no height or weight on file to calculate BMI.  General Appearance: NA  Eye Contact:  NA  Speech:  Clear and Coherent  Volume:  Normal  Mood:  Anxious  Affect:  NA  Thought Process:  Goal Directed  Orientation:  Full (Time, Place, and Person)  Thought Content:  Logical  Suicidal Thoughts:  No  Homicidal Thoughts:  No  Memory:  Immediate;   Good Recent;   Good Remote;   Good  Judgement:  Intact  Insight:  Present  Psychomotor Activity:  NA  Concentration:  Concentration: Good and Attention Span: Good  Recall:  Good  Fund of Knowledge:  Good  Language:  Good  Akathisia:  No  Handed:  Right  AIMS (if indicated):     Assets:  Communication Skills Desire for Improvement Housing Social Support Talents/Skills  ADL's:  Intact  Cognition:  WNL  Sleep:   ok with melatonin      Assessment and Plan: Major depressive disorder, recurrent.  Social anxiety disorder.  Panic attacks.  Patient is a stable on his medication however he is concerned about back pain and weight gain.  He is hoping to have a MRI soon.  He does not want to change the medication since it is working well.  Continue Ativan 0.5 mg daily and second if needed for severe anxiety.  Continue Anafranil 25 mg twice a day, trazodone 100 mg at bedtime and gabapentin 300 mg at bedtime.  He is not interested in therapy.  Recommended to call us back with any question or any concern.   Follow-up in 3 months.  Follow Up Instructions:    I discussed the assessment and treatment plan with the patient. The patient was provided an opportunity to ask questions and all were answered. The patient agreed with the plan and demonstrated an understanding of the instructions.   The patient was advised to call back or seek an in-person evaluation if the symptoms worsen or if the condition fails to improve as anticipated.  I provided 12 minutes of non-face-to-face time during this encounter.   Cleotis Nipper, MD

## 2020-10-11 ENCOUNTER — Telehealth: Payer: Self-pay

## 2020-10-11 NOTE — Telephone Encounter (Signed)
Phone call to patient to schedule an appointment for a follow up and TB Gold.  Voicemail left for patient to give the office a call.

## 2020-10-11 NOTE — Telephone Encounter (Signed)
Fax received from [facility] stating the patient's Paul Parrish is being delivered on 10/11/2020.

## 2020-10-19 ENCOUNTER — Other Ambulatory Visit: Payer: 59

## 2020-10-21 LAB — CBC WITH DIFFERENTIAL/PLATELET
Basophils Absolute: 0.1 10*3/uL (ref 0.0–0.2)
Basos: 1 %
EOS (ABSOLUTE): 0.1 10*3/uL (ref 0.0–0.4)
Eos: 1 %
Hematocrit: 51.5 % — ABNORMAL HIGH (ref 37.5–51.0)
Hemoglobin: 17.5 g/dL (ref 13.0–17.7)
Immature Grans (Abs): 0 10*3/uL (ref 0.0–0.1)
Immature Granulocytes: 0 %
Lymphocytes Absolute: 2.7 10*3/uL (ref 0.7–3.1)
Lymphs: 38 %
MCH: 28.9 pg (ref 26.6–33.0)
MCHC: 34 g/dL (ref 31.5–35.7)
MCV: 85 fL (ref 79–97)
Monocytes Absolute: 0.6 10*3/uL (ref 0.1–0.9)
Monocytes: 8 %
Neutrophils Absolute: 3.6 10*3/uL (ref 1.4–7.0)
Neutrophils: 52 %
Platelets: 250 10*3/uL (ref 150–450)
RBC: 6.05 x10E6/uL — ABNORMAL HIGH (ref 4.14–5.80)
RDW: 13.9 % (ref 11.6–15.4)
WBC: 7.1 10*3/uL (ref 3.4–10.8)

## 2020-10-21 LAB — TESTOSTERONE, FREE, TOTAL, SHBG
Sex Hormone Binding: 23.2 nmol/L (ref 16.5–55.9)
Testosterone, Free: 6.4 pg/mL — ABNORMAL LOW (ref 6.8–21.5)
Testosterone: 178 ng/dL — ABNORMAL LOW (ref 264–916)

## 2020-10-25 ENCOUNTER — Other Ambulatory Visit: Payer: Self-pay

## 2020-10-25 ENCOUNTER — Encounter: Payer: Self-pay | Admitting: "Endocrinology

## 2020-10-25 ENCOUNTER — Ambulatory Visit: Payer: 59 | Admitting: "Endocrinology

## 2020-10-25 VITALS — BP 124/82 | HR 80 | Ht 75.0 in | Wt 297.4 lb

## 2020-10-25 DIAGNOSIS — E291 Testicular hypofunction: Secondary | ICD-10-CM

## 2020-10-25 DIAGNOSIS — R7303 Prediabetes: Secondary | ICD-10-CM | POA: Diagnosis not present

## 2020-10-25 LAB — POCT GLYCOSYLATED HEMOGLOBIN (HGB A1C): HbA1c, POC (prediabetic range): 5.9 % (ref 5.7–6.4)

## 2020-10-25 NOTE — Patient Instructions (Signed)
                                     Advice for Weight Management  -For most of us the best way to lose weight is by diet management. Generally speaking, diet management means consuming less calories intentionally which over time brings about progressive weight loss.  This can be achieved more effectively by restricting carbohydrate consumption to the minimum possible.  So, it is critically important to know your numbers: how much calorie you are consuming and how much calorie you need. More importantly, our carbohydrates sources should be unprocessed or minimally processed complex starch food items.   Sometimes, it is important to balance nutrition by increasing protein intake (animal or plant source), fruits, and vegetables.  -Sticking to a routine mealtime to eat 3 meals a day and avoiding unnecessary snacks is shown to have a big role in weight control. Under normal circumstances, the only time we lose real weight is when we are hungry, so allow hunger to take place- hunger means no food between meal times, only water.  It is not advisable to starve.   -It is better to avoid simple carbohydrates including: Cakes, Sweet Desserts, Ice Cream, Soda (diet and regular), Sweet Tea, Candies, Chips, Cookies, Store Bought Juices, Alcohol in Excess of  1-2 drinks a day, Lemonade,  Artificial Sweeteners, Doughnuts, Coffee Creamers, "Sugar-free" Products, etc, etc.  This is not a complete list.....    -Consulting with certified diabetes educators is proven to provide you with the most accurate and current information on diet.  Also, you may be  interested in discussing diet options/exchanges , we can schedule a visit with Paul Parrish, RDN, CDE for individualized nutrition education.  -Exercise: If you are able: 30 -60 minutes a day ,4 days a week, or 150 minutes a week.  The longer the better.  Combine stretch, strength, and aerobic activities.  If you were told in the  past that you have high risk for cardiovascular diseases, you may seek evaluation by your heart doctor prior to initiating moderate to intense exercise programs.                                  Additional Care Considerations for Diabetes   -Diabetes  is a chronic disease.  The most important care consideration is regular follow-up with your diabetes care provider with the goal being avoiding or delaying its complications and to take advantage of advances in medications and technology.    -Type 2 diabetes is known to coexist with other important comorbidities such as high blood pressure and high cholesterol.  It is critical to control not only the diabetes but also the high blood pressure and high cholesterol to minimize and delay the risk of complications including coronary artery disease, stroke, amputations, blindness, etc.    - Studies showed that people with diabetes will benefit from a class of medications known as ACE inhibitors and statins.  Unless there are specific reasons not to be on these medications, the standard of care is to consider getting one from these groups of medications at an optimal doses.  These medications are generally considered safe and proven to help protect the heart and the kidneys.    - People with diabetes are encouraged to initiate and maintain regular follow-up with eye doctors, foot   doctors, dentists , and if necessary heart and kidney doctors.     - It is highly recommended that people with diabetes quit smoking or stay away from smoking, and get yearly  flu vaccine and pneumonia vaccine at least every 5 years.  One other important lifestyle recommendation is to ensure adequate sleep - at least 6-7 hours of uninterrupted sleep at night.  -Exercise: If you are able: 30 -60 minutes a day, 4 days a week, or 150 minutes a week.  The longer the better.  Combine stretch, strength, and aerobic activities.  If you were told in the past that you have high risk for  cardiovascular diseases, you may seek evaluation by your heart doctor prior to initiating moderate to intense exercise programs.          

## 2020-10-25 NOTE — Progress Notes (Signed)
10/25/2020       Endocrinology follow-up note   HPI: Paul Parrish is a 50 y.o.-year-old man.  - He is being seen in follow-up for  hypogonadism.  He was diagnosed with hypogonadism in 2014, etiology unclear.   -Due to polycythemia, he undergoes phlebotomy periodically.   He was advised to lower his testosterone to 100 mg every 14 days.  He underwent phlebotomy at least 1 time since last visit, has been doing it every 56 days.   His previsit labs show improving polycythemia.  On the last day of his injection cycle, his total testosterone was 178, dropping from 525 during his last visit.  He has no new complaints today.  He reports steady energy level and wishes to continue on testosterone supplement.      -He reports history of seizures as a young man until his teenage years. -He denies any history of head injury.  - He also is recently diagnosed with sleep apnea currently on CPAP.  - He fathers 2 teenage boys biologically. - He has significant mood disorder on multiple antidepressants and antianxiety. No herbal medicines. -He denies family history of premature coronary artery disease.   ROS: Limited as above.  PE: BP 124/82   Pulse 80   Ht 6' 3"  (1.905 m)   Wt 297 lb 6.4 oz (134.9 kg)   BMI 37.17 kg/m  Wt Readings from Last 3 Encounters:  10/25/20 297 lb 6.4 oz (134.9 kg)  08/30/20 291 lb (132 kg)  08/18/20 291 lb (132 kg)     Recent Results (from the past 2160 hour(s))  CMP14+EGFR     Status: None   Collection Time: 08/14/20 11:36 AM  Result Value Ref Range   Glucose 92 65 - 99 mg/dL   BUN 15 6 - 24 mg/dL   Creatinine, Ser 1.14 0.76 - 1.27 mg/dL   GFR calc non Af Amer 75 >59 mL/min/1.73   GFR calc Af Amer 87 >59 mL/min/1.73    Comment: **In accordance with recommendations from the NKF-ASN Task force,**   [facility] is in the process of updating its eGFR calculation to the   2021 CKD-EPI creatinine equation that estimates kidney function   without a race variable.     BUN/Creatinine Ratio 13 9 - 20   Sodium 138 134 - 144 mmol/L   Potassium 4.2 3.5 - 5.2 mmol/L   Chloride 101 96 - 106 mmol/L   CO2 22 20 - 29 mmol/L   Calcium 9.3 8.7 - 10.2 mg/dL   Total Protein 7.4 6.0 - 8.5 g/dL   Albumin 4.8 4.0 - 5.0 g/dL   Globulin, Total 2.6 1.5 - 4.5 g/dL   Albumin/Globulin Ratio 1.8 1.2 - 2.2   Bilirubin Total 0.5 0.0 - 1.2 mg/dL   Alkaline Phosphatase 77 44 - 121 IU/L    Comment:               **Please note reference interval change**   AST 22 0 - 40 IU/L   ALT 25 0 - 44 IU/L  Lipid panel     Status: Abnormal   Collection Time: 08/14/20 11:36 AM  Result Value Ref Range   Cholesterol, Total 217 (H) 100 - 199 mg/dL   Triglycerides 234 (H) 0 - 149 mg/dL   HDL 31 (L) >39 mg/dL   VLDL Cholesterol Cal 43 (H) 5 - 40 mg/dL   LDL Chol Calc (NIH) 143 (H) 0 - 99 mg/dL   Chol/HDL Ratio 7.0 (H) 0.0 -  5.0 ratio    Comment:                                   T. Chol/HDL Ratio                                             Men  Women                               1/2 Avg.Risk  3.4    3.3                                   Avg.Risk  5.0    4.4                                2X Avg.Risk  9.6    7.1                                3X Avg.Risk 23.4   11.0   Testosterone, Free, Total, SHBG     Status: Abnormal   Collection Time: 10/19/20  9:41 AM  Result Value Ref Range   Testosterone 178 (L) 264 - 916 ng/dL    Comment: Adult male reference interval is based on a population of healthy nonobese males (BMI <30) between 76 and 5 years old. [place], [place] [phone]. PMID: [id number].    Testosterone, Free 6.4 (L) 6.8 - 21.5 pg/mL   Sex Hormone Binding 23.2 16.5 - 55.9 nmol/L  CBC with Differential/Platelet     Status: Abnormal   Collection Time: 10/19/20  9:41 AM  Result Value Ref Range   WBC 7.1 3.4 - 10.8 x10E3/uL   RBC 6.05 (H) 4.14 - 5.80 x10E6/uL   Hemoglobin 17.5 13.0 - 17.7 g/dL   Hematocrit 51.5 (H) 37.5 - 51.0 %   MCV 85 79 - 97 fL   MCH 28.9  26.6 - 33.0 pg   MCHC 34.0 31.5 - 35.7 g/dL   RDW 13.9 11.6 - 15.4 %   Platelets 250 150 - 450 x10E3/uL   Neutrophils 52 Not Estab. %   Lymphs 38 Not Estab. %   Monocytes 8 Not Estab. %   Eos 1 Not Estab. %   Basos 1 Not Estab. %   Neutrophils Absolute 3.6 1.4 - 7.0 x10E3/uL   Lymphocytes Absolute 2.7 0.7 - 3.1 x10E3/uL   Monocytes Absolute 0.6 0.1 - 0.9 x10E3/uL   EOS (ABSOLUTE) 0.1 0.0 - 0.4 x10E3/uL   Basophils Absolute 0.1 0.0 - 0.2 x10E3/uL   Immature Granulocytes 0 Not Estab. %   Immature Grans (Abs) 0.0 0.0 - 0.1 x10E3/uL  HgB A1c     Status: None   Collection Time: 10/25/20  1:56 PM  Result Value Ref Range   Hemoglobin A1C     HbA1c POC (<> result, manual entry)     HbA1c, POC (prediabetic range) 5.9 5.7 - 6.4 %   HbA1c, POC (controlled diabetic range)        ASSESSMENT: 1.  Hypogonadism  2. Erectile Dysfunction 3.  Polycythemia 4.  Prediabetes  PLAN:  1. Hypogonadism  -He continues to do phlebotomy every 56 days secondary polycythemia.  -More recently his CBC is consistent with polycythemia which is improving from last visit.  His previsit total testosterone was 178 on last day of his injection cycle.    He wishes to be continued on testosterone supplement.  He will be continued on the same dose of testosterone,  testosterone  100 mg IM every 14 days , to give him a total monthly dose of 200 mg.   -I had a long discussion with him about safe use of testosterone, to avoid polycythemia.  His history of  secondary polycythemia which continues to require periodic phlebotomy, as well as sleep apnea requiring CPAP treatment making him a poor candidate for more generous testosterone replacement.  He reports less frequent hypoglycemia.  His point-of-care A1c is 5.9% consistent with prediabetes.  He was was previously diagnosed with prediabetes, reactive hypoglycemia.    - he acknowledges that there is a room for improvement in his food and drink choices. - Suggestion is  made for him to avoid simple carbohydrates  from his diet including Cakes, Sweet Desserts, Ice Cream, Soda (diet and regular), Sweet Tea, Candies, Chips, Cookies, Store Bought Juices, Alcohol in Excess of  1-2 drinks a day, Artificial Sweeteners,  Coffee Creamer, and "Sugar-free" Products, Lemonade. This will help patient to have more stable blood glucose profile and potentially avoid unintended weight gain.   He will not need work-up for hypoglycemia at this time.  He is advised to continue close follow-up with his PMD for primary care needs.     - Time spent on this patient care encounter:  20 minutes of which 50% was spent in  counseling and the rest reviewing  his current and  previous labs / studies and medications  doses and developing a plan for long term care. Paul Parrish  participated in the discussions, expressed understanding, and voiced agreement with the above plans.  All questions were answered to his satisfaction. he is encouraged to contact clinic should he have any questions or concerns prior to his return visit.    Paul Lloyd, MD Phone: [phone]  Fax: [phone]  -  This note was partially dictated with voice recognition software. Similar sounding words can be transcribed inadequately or may not  be corrected upon review.  10/25/2020, 2:12 PM

## 2020-10-26 ENCOUNTER — Ambulatory Visit: Payer: 59 | Admitting: Family Medicine

## 2020-10-26 ENCOUNTER — Encounter: Payer: Self-pay | Admitting: Family Medicine

## 2020-10-26 VITALS — BP 143/88 | HR 110 | Ht 75.0 in | Wt 294.0 lb

## 2020-10-26 DIAGNOSIS — M5416 Radiculopathy, lumbar region: Secondary | ICD-10-CM

## 2020-10-26 MED ORDER — HYDROCODONE-ACETAMINOPHEN 7.5-325 MG PO TABS
1.0000 | ORAL_TABLET | Freq: Two times a day (BID) | ORAL | 0 refills | Status: DC | PRN
Start: 2020-10-26 — End: 2021-02-15

## 2020-10-26 NOTE — Progress Notes (Signed)
BP (!) 143/88   Pulse (!) 110   Ht 6\' 3"  (1.905 m)   Wt 294 lb (133.4 kg)   SpO2 98%   BMI 36.75 kg/m    Subjective:   Patient ID: Paul Parrish, male    DOB: 12-13-1970, 50 y.o.   MRN: [id number]  HPI: Paul Parrish is a 50 y.o. male presenting on 10/26/2020 for Back Pain (Lower back. Radiates to right leg)   HPI  Pt coming in today for recurrent chronic lower back pain radiating down his right leg. He states this has been going on since September of last year and has gotten progressively worse. He denies physical injury to his back at the time. He has tried conservative management and physical therapy but symptoms continue to worsen. Hydrocodone relieves so of the pain but it always comes back. He states that pain is worse with movement and is negatively impacting daily life. He was first seen for this back in November and received a steroid injection and x-ray. He then followed up in December without improvement which is when he began physical therapy. Today he wants to know if he can get an MRI. He denies numbness, tingling, weakness, fevers, chills, unintentional weight loss, or incontinence.  Patient has shown no improvement with physical therapy and, even believes it may be worse.  Relevant past medical, surgical, family and social history reviewed and updated as indicated. Interim medical history since our last visit reviewed. Allergies and medications reviewed and updated.  Review of Systems  Constitutional: Negative.   HENT: Negative.   Eyes: Negative.   Respiratory: Negative.   Cardiovascular: Negative.   Endocrine: Negative.   Genitourinary: Negative.   Musculoskeletal: Positive for back pain. Negative for gait problem, joint swelling and neck pain.  Skin: Negative.   Neurological: Negative for weakness and numbness.  Psychiatric/Behavioral: Negative.     Per HPI unless specifically indicated above   Allergies as of 10/26/2020      Reactions   Amoxicillin Other (See  Comments)   High fever and possible heart racing   Bactrim [sulfamethoxazole-trimethoprim] Other (See Comments)   Fever, body aches, chills   Penicillins Other (See Comments)   High fever Has patient had a PCN reaction causing immediate rash, facial/tongue/throat swelling, SOB or lightheadedness with hypotension: No Has patient had a PCN reaction causing severe rash involving mucus membranes or skin necrosis: No Has patient had a PCN reaction that required hospitalization No Has patient had a PCN reaction occurring within the last 10 years: No If all of the above answers are "NO", then may proceed with Cephalosporin use.      Medication List       Accurate as of October 26, 2020  9:27 AM. If you have any questions, ask your nurse or doctor.        clomiPRAMINE 25 MG capsule Commonly known as: ANAFRANIL Take 1 capsule (25 mg total) by mouth 2 (two) times daily.   Fish Oil 1000 MG Caps Take 1 capsule by mouth 2 (two) times daily.   gabapentin 300 MG capsule Commonly known as: NEURONTIN Take 1 capsule (300 mg total) by mouth at bedtime.   HYDROcodone-acetaminophen 7.5-325 MG tablet Commonly known as: Norco Take 1 tablet by mouth 2 (two) times daily as needed for moderate pain.   LORazepam 0.5 MG tablet Commonly known as: Ativan Take one tab daily as needed for anxiety and 2nd if needed again   Melatonin 10 MG Tabs Take by mouth.  rosuvastatin 10 MG tablet Commonly known as: Crestor Take 1 tablet (10 mg total) by mouth daily.   sildenafil 20 MG tablet Commonly known as: REVATIO Take 1-5 tablets (20-100 mg total) by mouth as needed. Take 2-5 tabs PRN prior to sexual activity   Skyrizi (150 MG Dose) 75 MG/0.83ML Pskt Generic drug: Risankizumab-rzaa(150 MG Dose) Inject 150 mg into the skin as directed. Every 12 weeks for maintenance.   SYRINGE-NEEDLE (DISP) 3 ML 21G X 1-1/2" 3 ML Misc Use to inject testosterone every week   Testosterone Cypionate 100 MG/ML  Soln Inject 100mg  ( 17ml) IM every 14 days.   traZODone 100 MG tablet Commonly known as: DESYREL Take 1 tablet (100 mg total) by mouth at bedtime as needed for sleep.        Objective:   BP (!) 143/88   Pulse (!) 110   Ht 6\' 3"  (1.905 m)   Wt 294 lb (133.4 kg)   SpO2 98%   BMI 36.75 kg/m   Wt Readings from Last 3 Encounters:  10/26/20 294 lb (133.4 kg)  10/25/20 297 lb 6.4 oz (134.9 kg)  08/30/20 291 lb (132 kg)    Physical Exam Constitutional:      Appearance: Normal appearance.  Eyes:     Pupils: Pupils are equal, round, and reactive to light.  Cardiovascular:     Rate and Rhythm: Normal rate and regular rhythm.     Pulses: Normal pulses.     Heart sounds: Normal heart sounds.  Pulmonary:     Effort: Pulmonary effort is normal.     Breath sounds: Normal breath sounds.  Abdominal:     General: Abdomen is flat.     Palpations: Abdomen is soft.  Musculoskeletal:        General: No swelling or signs of injury.     Cervical back: Normal range of motion.     Lumbar back: Tenderness present. Positive right straight leg raise test. Negative left straight leg raise test.       Back:     Comments: Positive Faber test in right leg  Skin:    General: Skin is warm and dry.  Neurological:     General: No focal deficit present.     Mental Status: He is alert and oriented to person, place, and time.  Psychiatric:        Mood and Affect: Mood normal.        Behavior: Behavior normal.       Assessment & Plan:   Problem List Items Addressed This Visit   None   Visit Diagnoses    Lumbar radicular pain    -  Primary   Relevant Orders   MR Lumbar Spine Wo Contrast       Follow up plan: Return if symptoms worsen or fail to improve.  Pt's symptoms continue to worsen after conservative management and physical therapy. This problem has been going on for 5 months. - Norco 1 tablet po bid prn Pt advised not to take Norco and Ativan within 8 hrs of each other.  Will  order an MRI to further assess the nature of Pt's pain and determine best next step in management.  Counseling provided for all of the vaccine components Orders Placed This Encounter  Procedures  . MR Lumbar Spine Wo Contrast   Silverio Decamp 10/26/2020   I was personally present for all components of the history, physical exam and/or medical decision making.  I agree with the documentation  performed by the PA student and agree with assessment and plan above.  PA student was [employer] [name], MD [facility] 11/05/2020, 9:26 PM    [name], MD [facility] 10/26/2020, 9:27 AM

## 2020-10-30 ENCOUNTER — Telehealth: Payer: Self-pay | Admitting: Family Medicine

## 2020-11-07 ENCOUNTER — Encounter: Payer: Self-pay | Admitting: Dermatology

## 2020-11-07 ENCOUNTER — Ambulatory Visit: Payer: 59 | Admitting: Dermatology

## 2020-11-07 ENCOUNTER — Other Ambulatory Visit: Payer: Self-pay

## 2020-11-07 DIAGNOSIS — Z79899 Other long term (current) drug therapy: Secondary | ICD-10-CM

## 2020-11-07 DIAGNOSIS — L409 Psoriasis, unspecified: Secondary | ICD-10-CM

## 2020-11-07 MED ORDER — BETAMETHASONE DIPROPIONATE AUG 0.05 % EX CREA: TOPICAL_CREAM | Freq: Two times a day (BID) | CUTANEOUS | 3 refills | Status: DC

## 2020-11-09 ENCOUNTER — Other Ambulatory Visit: Payer: Self-pay

## 2020-11-09 ENCOUNTER — Ambulatory Visit (HOSPITAL_COMMUNITY)
Admission: RE | Admit: 2020-11-09 | Discharge: 2020-11-09 | Disposition: A | Discharge status: Home / Self Care | Payer: 59 | Source: Ambulatory Visit | Attending: Family Medicine | Admitting: Family Medicine

## 2020-11-09 ENCOUNTER — Other Ambulatory Visit: Payer: Self-pay | Admitting: Family Medicine

## 2020-11-09 DIAGNOSIS — M5416 Radiculopathy, lumbar region: Secondary | ICD-10-CM | POA: Diagnosis not present

## 2020-11-09 DIAGNOSIS — M5136 Other intervertebral disc degeneration, lumbar region: Secondary | ICD-10-CM

## 2020-11-09 DIAGNOSIS — M5126 Other intervertebral disc displacement, lumbar region: Secondary | ICD-10-CM

## 2020-11-18 ENCOUNTER — Encounter: Payer: Self-pay | Admitting: Dermatology

## 2020-11-18 NOTE — Progress Notes (Signed)
   Follow-Up Visit   Subjective  Paul Parrish is a 50 y.o. male who presents for the following: Psoriasis (Patient here today for follow up on [place] and needing yearly TB Gold.  Per patient he is noticing more flares within the last year.).  Psoriasis Location: Was all over Duration:  Quality: Much improved but had been essentially 100% clearing some new scaly spots now Associated Signs/Symptoms: Itch Modifying Factors: Skyrizi Severity:  Timing: Context:   Objective  Well appearing patient in no apparent distress; mood and affect are within normal limits. Objective  Chest - Medial [facility]): Pliant with his biologic and im again detailed the importance of choosing healthy lifestyles because of the increased risk of cardiovascular disease, diabetes, elevated blood pressure, elevated cholesterol associated with psoriasis.  Proved on skyrizi but does have a break through with some small plaques on extremities more than torso.  Discussed switching Biologics but he may respond to the addition of class II potency topical steroid so we will do this initially.    All skin waist up examined.   Assessment & Plan    Psoriasis Chest - Medial (Center)  Topical generic Diprolene daily after bathing for 1 month and then please call the office or contact me on my chart for status update.  Other Related Procedures QuantiFERON-TB Gold Plus  Ordered Medications: augmented betamethasone dipropionate (DIPROLENE-AF) 0.05 % cream  Drug therapy  Other Related Procedures QuantiFERON-TB Gold Plus      I, Lavonna Monarch, MD, have reviewed all documentation for this visit.  The documentation on 11/18/20 for the exam, diagnosis, procedures, and orders are all accurate and complete.

## 2020-12-07 ENCOUNTER — Ambulatory Visit: Payer: 59 | Admitting: Dermatology

## 2020-12-14 LAB — QUANTIFERON-TB GOLD PLUS
Mitogen-NIL: >10 IU/mL
NIL: 0.06 IU/mL
QuantiFERON-TB Gold Plus: NEGATIVE
TB1-NIL: <0 IU/mL
TB2-NIL: <0 IU/mL

## 2020-12-29 HISTORY — PX: LUMBAR SPINE SURGERY: SHX701

## 2021-01-02 ENCOUNTER — Encounter (HOSPITAL_COMMUNITY): Payer: Self-pay | Admitting: Psychiatry

## 2021-01-02 ENCOUNTER — Other Ambulatory Visit: Payer: Self-pay

## 2021-01-02 ENCOUNTER — Telehealth (INDEPENDENT_AMBULATORY_CARE_PROVIDER_SITE_OTHER): Payer: 59 | Admitting: Psychiatry

## 2021-01-02 DIAGNOSIS — F331 Major depressive disorder, recurrent, moderate: Secondary | ICD-10-CM | POA: Diagnosis not present

## 2021-01-02 DIAGNOSIS — F4010 Social phobia, unspecified: Secondary | ICD-10-CM

## 2021-01-02 DIAGNOSIS — F410 Panic disorder [episodic paroxysmal anxiety] without agoraphobia: Secondary | ICD-10-CM

## 2021-01-02 MED ORDER — LORAZEPAM 0.5 MG PO TABS: ORAL_TABLET | ORAL | 2 refills | Status: DC

## 2021-01-02 MED ORDER — CLOMIPRAMINE HCL 25 MG PO CAPS: 25.0000 mg | ORAL_CAPSULE | Freq: Two times a day (BID) | ORAL | 2 refills | Status: DC

## 2021-01-02 MED ORDER — TRAZODONE HCL 100 MG PO TABS: 100.0000 mg | ORAL_TABLET | Freq: Every evening | ORAL | 2 refills | Status: DC | PRN

## 2021-01-02 NOTE — Progress Notes (Signed)
Virtual Visit via Telephone Note  I connected with Paul Parrish on 01/02/21 at  3:00 PM EDT by telephone and verified that I am speaking with the correct person using two identifiers.  Location: Patient: Home Provider: Work   I discussed the limitations, risks, security and privacy concerns of performing an evaluation and management service by telephone and the availability of in person appointments. I also discussed with the patient that there may be a patient responsible charge related to this service. The patient expressed understanding and agreed to proceed.   History of Present Illness: Patient is treated by phone session.  He is having back procedure tomorrow morning with Dr. Trenton Gammon and is hoping it helps his pain numbness.  He had stopped taking the gabapentin because it did not help his anxiety pain.  Overall he describes his mood is stable but he struggle with attention, focus and some at home.  He used to take ADD medication however it has stopped.  He had a good support from his family.  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  He will take a week off after the procedure.  He is still struggle with anxiety when he goes outside in crowded places but denies any major panic attack.  He like the Ativan which he takes every day.  He also taking Anafranil which is helping his anxiety.  Has cut down because of his high globin.  He is trying to lose weight and he lost weight since he stopped the gabapentin.  He is hoping once his back pain resolved and he will be more active.  He has no tremors, shakes or any EPS.  He denies any crying spells, hopelessness or any suicidal thoughts  PastPsychiatric History: H/Oanxiety, depression and sawpsychiatrist at neuropsychiatry in [place] but not happy with the treatment. Saw Dr. Daron Offer in 2018 and prescribed Prozac, Xanax, Effexor, Celexa, propanolol, Lamictal, Vistaril, Adderall,Wellbutrin,Cymbalta(increased  irritability),Anafranil andLuvox (did not help).H/Oof ECT with no response. No h/osuicidal attempt or inpatient.At [facility] tried [place], Klonopin but did not work. H/Oheavy drinking but claims to be sober.   Psychiatric Specialty Exam: Physical Exam  Review of Systems  Weight 282 lb (127.9 kg).There is no height or weight on file to calculate BMI.  General Appearance: NA  Eye Contact:  NA  Speech:  Clear and Coherent  Volume:  Normal  Mood:  Anxious  Affect:  NA  Thought Process:  Goal Directed  Orientation:  Full (Time, Place, and Person)  Thought Content:  WDL  Suicidal Thoughts:  No  Homicidal Thoughts:  No  Memory:  Immediate;   Good Recent;   Good Remote;   Good  Judgement:  Good  Insight:  Good  Psychomotor Activity:  NA  Concentration:  Concentration: Good and Attention Span: Good  Recall:  Good  Fund of Knowledge:  Good  Language:  Good  Akathisia:  No  Handed:  Right  AIMS (if indicated):     Assets:  Communication Skills Desire for Improvement Housing Resilience Social Support Transportation  ADL's:  Intact  Cognition:  WNL  Sleep:   fair      Assessment and Plan: Major depressive disorder, recurrent.  Social anxiety disorder.  Panic attack.  Patient is having seizure for his back.  He is anxious but also ready for it.  We talked about trying Wellbutrin which he has still to help his attention and focus.  I recommend have his  procedure done to see if that helps his motivation and energy level.  If patient do not see any improvement procedure we will consider adding low-dose Ritalin continue Anafranil 25 mg twice a day, trazodone 100 mg at bedtime and lorazepam 0.5 mg daily and second if needed for severe anxiety.  Commended to call us back with any question or any concern.  Follow-up in 3 months.  Follow Up Instructions:    I discussed the assessment and treatment plan with the patient. The patient was provided an opportunity to ask  questions and all were answered. The patient agreed with the plan and demonstrated an understanding of the instructions.   The patient was advised to call back or seek an in-person evaluation if the symptoms worsen or if the condition fails to improve as anticipated.  I provided 12 minutes of non-face-to-face time during this encounter.   Kathlee Nations, MD

## 2021-02-09 ENCOUNTER — Other Ambulatory Visit: Payer: Self-pay

## 2021-02-09 ENCOUNTER — Encounter: Payer: Self-pay | Admitting: Family Medicine

## 2021-02-09 DIAGNOSIS — Z13220 Encounter for screening for lipoid disorders: Secondary | ICD-10-CM

## 2021-02-09 DIAGNOSIS — R7303 Prediabetes: Secondary | ICD-10-CM

## 2021-02-09 DIAGNOSIS — E781 Pure hyperglyceridemia: Secondary | ICD-10-CM

## 2021-02-13 ENCOUNTER — Other Ambulatory Visit: Payer: 59

## 2021-02-13 ENCOUNTER — Other Ambulatory Visit: Payer: Self-pay

## 2021-02-13 ENCOUNTER — Other Ambulatory Visit: Payer: Self-pay | Admitting: Student

## 2021-02-13 DIAGNOSIS — E781 Pure hyperglyceridemia: Secondary | ICD-10-CM

## 2021-02-13 DIAGNOSIS — R7303 Prediabetes: Secondary | ICD-10-CM

## 2021-02-13 DIAGNOSIS — Z13220 Encounter for screening for lipoid disorders: Secondary | ICD-10-CM

## 2021-02-13 DIAGNOSIS — M5416 Radiculopathy, lumbar region: Secondary | ICD-10-CM

## 2021-02-13 LAB — BAYER DCA HB A1C WAIVED: HB A1C (BAYER DCA - WAIVED): 6 % (ref <7.0)

## 2021-02-14 LAB — CMP14+EGFR
ALT: 17 IU/L (ref 0–44)
AST: 18 IU/L (ref 0–40)
Albumin/Globulin Ratio: 1.7 (ref 1.2–2.2)
Albumin: 4.4 g/dL (ref 4.0–5.0)
Alkaline Phosphatase: 98 IU/L (ref 44–121)
BUN/Creatinine Ratio: 13 (ref 9–20)
BUN: 12 mg/dL (ref 6–24)
Bilirubin Total: 0.4 mg/dL (ref 0.0–1.2)
CO2: 23 mmol/L (ref 20–29)
Calcium: 9.4 mg/dL (ref 8.7–10.2)
Chloride: 99 mmol/L (ref 96–106)
Creatinine, Ser: 0.89 mg/dL (ref 0.76–1.27)
Globulin, Total: 2.6 g/dL (ref 1.5–4.5)
Glucose: 84 mg/dL (ref 65–99)
Potassium: 4.2 mmol/L (ref 3.5–5.2)
Sodium: 139 mmol/L (ref 134–144)
Total Protein: 7 g/dL (ref 6.0–8.5)
eGFR: 105 mL/min/{1.73_m2} (ref >59)

## 2021-02-14 LAB — CBC WITH DIFFERENTIAL/PLATELET
Basophils Absolute: 0.1 10*3/uL (ref 0.0–0.2)
Basos: 1 %
EOS (ABSOLUTE): 0.1 10*3/uL (ref 0.0–0.4)
Eos: 1 %
Hematocrit: 42.2 % (ref 37.5–51.0)
Hemoglobin: 14.5 g/dL (ref 13.0–17.7)
Immature Grans (Abs): 0 10*3/uL (ref 0.0–0.1)
Immature Granulocytes: 0 %
Lymphocytes Absolute: 2.6 10*3/uL (ref 0.7–3.1)
Lymphs: 30 %
MCH: 29.8 pg (ref 26.6–33.0)
MCHC: 34.4 g/dL (ref 31.5–35.7)
MCV: 87 fL (ref 79–97)
Monocytes Absolute: 0.8 10*3/uL (ref 0.1–0.9)
Monocytes: 9 %
Neutrophils Absolute: 5.1 10*3/uL (ref 1.4–7.0)
Neutrophils: 59 %
Platelets: 325 10*3/uL (ref 150–450)
RBC: 4.86 x10E6/uL (ref 4.14–5.80)
RDW: 11.8 % (ref 11.6–15.4)
WBC: 8.7 10*3/uL (ref 3.4–10.8)

## 2021-02-14 LAB — LIPID PANEL
Chol/HDL Ratio: 4.4 ratio (ref 0.0–5.0)
Cholesterol, Total: 124 mg/dL (ref 100–199)
HDL: 28 mg/dL — ABNORMAL LOW (ref >39)
LDL Chol Calc (NIH): 70 mg/dL (ref 0–99)
Triglycerides: 145 mg/dL (ref 0–149)
VLDL Cholesterol Cal: 26 mg/dL (ref 5–40)

## 2021-02-14 LAB — PSA: Prostate Specific Ag, Serum: 0.5 ng/mL (ref 0.0–4.0)

## 2021-02-14 LAB — TESTOSTERONE, FREE, TOTAL, SHBG
Sex Hormone Binding: 29.2 nmol/L (ref 16.5–55.9)
Testosterone, Free: 7.2 pg/mL (ref 6.8–21.5)
Testosterone: 323 ng/dL (ref 264–916)

## 2021-02-15 ENCOUNTER — Other Ambulatory Visit: Payer: Self-pay

## 2021-02-15 ENCOUNTER — Encounter: Payer: Self-pay | Admitting: Family Medicine

## 2021-02-15 ENCOUNTER — Ambulatory Visit: Payer: 59 | Admitting: Family Medicine

## 2021-02-15 VITALS — BP 133/91 | HR 93 | Ht 75.0 in | Wt 274.0 lb

## 2021-02-15 DIAGNOSIS — E781 Pure hyperglyceridemia: Secondary | ICD-10-CM

## 2021-02-15 DIAGNOSIS — Z23 Encounter for immunization: Secondary | ICD-10-CM | POA: Diagnosis not present

## 2021-02-15 DIAGNOSIS — R030 Elevated blood-pressure reading, without diagnosis of hypertension: Secondary | ICD-10-CM | POA: Diagnosis not present

## 2021-02-15 DIAGNOSIS — Z1211 Encounter for screening for malignant neoplasm of colon: Secondary | ICD-10-CM | POA: Diagnosis not present

## 2021-02-15 DIAGNOSIS — E785 Hyperlipidemia, unspecified: Secondary | ICD-10-CM | POA: Insufficient documentation

## 2021-02-15 DIAGNOSIS — E782 Mixed hyperlipidemia: Secondary | ICD-10-CM | POA: Diagnosis not present

## 2021-02-15 DIAGNOSIS — R7303 Prediabetes: Secondary | ICD-10-CM

## 2021-02-15 NOTE — Progress Notes (Signed)
BP (!) 133/91   Pulse 93   Ht 6' 3"  (1.905 m)   Wt 274 lb (124.3 kg)   SpO2 98%   BMI 34.25 kg/m    Subjective:   Patient ID: Paul Parrish, male    DOB: 1971/08/02, 50 y.o.   MRN: [id number]  HPI: Paul Parrish is a 50 y.o. male presenting on 02/15/2021 for Medical Management of Chronic Issues   HPI Elevated blood pressure without diagnosis hypertension Patient is currently on no medication currently and monitoring, and their blood pressure today is 133/91. Patient denies any lightheadedness or dizziness. Patient denies headaches, blurred vision, chest pains, shortness of breath, or weakness. Denies any side effects from medication and is content with current medication.   Hyperlipidemia and hypertriglyceridemia Patient is coming in for recheck of his hyperlipidemia. The patient is currently taking fish oil and Crestor. They deny any issues with myalgias or history of liver damage from it. They deny any focal numbness or weakness or chest pain.   Prediabetes Patient comes in today for recheck of his diabetes. Patient has been currently taking no medication has been diet controlled, A1c 6.0. Patient is not currently on an ACE inhibitor/ARB. Patient has not seen an ophthalmologist this year. Patient denies any issues with their feet. The symptom started onset as an adult hyperlipidemia and hypertriglyceridemia and elevated blood pressure ARE RELATED TO DM   Relevant past medical, surgical, family and social history reviewed and updated as indicated. Interim medical history since our last visit reviewed. Allergies and medications reviewed and updated.  Review of Systems  Constitutional: Negative for chills and fever.  Eyes: Negative for visual disturbance.  Respiratory: Negative for shortness of breath and wheezing.   Cardiovascular: Negative for chest pain and leg swelling.  Musculoskeletal: Negative for back pain and gait problem.  Skin: Negative for rash.  Neurological: Negative for  dizziness, weakness and light-headedness.  All other systems reviewed and are negative.   Per HPI unless specifically indicated above   Allergies as of 02/15/2021      Reactions   Amoxicillin Other (See Comments)   High fever and possible heart racing   Bactrim [sulfamethoxazole-trimethoprim] Other (See Comments)   Fever, body aches, chills   Penicillins Other (See Comments)   High fever Has patient had a PCN reaction causing immediate rash, facial/tongue/throat swelling, SOB or lightheadedness with hypotension: No Has patient had a PCN reaction causing severe rash involving mucus membranes or skin necrosis: No Has patient had a PCN reaction that required hospitalization No Has patient had a PCN reaction occurring within the last 10 years: No If all of the above answers are "NO", then may proceed with Cephalosporin use.      Medication List       Accurate as of Feb 15, 2021  3:31 PM. If you have any questions, ask your nurse or doctor.        augmented betamethasone dipropionate 0.05 % cream Commonly known as: DIPROLENE-AF Apply topically 2 (two) times daily. Large surface area   clomiPRAMINE 25 MG capsule Commonly known as: ANAFRANIL Take 1 capsule (25 mg total) by mouth 2 (two) times daily.   Fish Oil 1000 MG Caps Take 1 capsule by mouth 2 (two) times daily.   gabapentin 300 MG capsule Commonly known as: NEURONTIN Take 1 capsule (300 mg total) by mouth at bedtime.   HYDROcodone-acetaminophen 7.5-325 MG tablet Commonly known as: Norco Take 1 tablet by mouth 2 (two) times daily as needed for moderate  pain.   LORazepam 0.5 MG tablet Commonly known as: Ativan Take one tab daily as needed for anxiety and 2nd if needed again   Melatonin 10 MG Tabs Take by mouth.   Oxycodone HCl 10 MG Tabs Take 10 mg by mouth every 6 (six) hours.   rosuvastatin 10 MG tablet Commonly known as: Crestor Take 1 tablet (10 mg total) by mouth daily.   sildenafil 20 MG  tablet Commonly known as: REVATIO Take 1-5 tablets (20-100 mg total) by mouth as needed. Take 2-5 tabs PRN prior to sexual activity   Skyrizi 150 MG/ML Sosy Generic drug: Risankizumab-rzaa   SYRINGE-NEEDLE (DISP) 3 ML 21G X 1-1/2" 3 ML Misc Use to inject testosterone every week   testosterone cypionate 200 MG/ML injection Commonly known as: DEPOTESTOSTERONE CYPIONATE INJECT 50MG (0.25 ML) AS DIRECTED EVERY 14 DAYS   traZODone 100 MG tablet Commonly known as: DESYREL Take 1 tablet (100 mg total) by mouth at bedtime as needed for sleep.        Objective:   BP (!) 133/91   Pulse 93   Ht 6' 3"  (1.905 m)   Wt 274 lb (124.3 kg)   SpO2 98%   BMI 34.25 kg/m   Wt Readings from Last 3 Encounters:  02/15/21 274 lb (124.3 kg)  10/26/20 294 lb (133.4 kg)  10/25/20 297 lb 6.4 oz (134.9 kg)    Physical Exam Vitals and nursing note reviewed.  Constitutional:      General: He is not in acute distress.    Appearance: He is well-developed. He is not diaphoretic.  Eyes:     General: No scleral icterus.    Conjunctiva/sclera: Conjunctivae normal.  Neck:     Thyroid: No thyromegaly.  Cardiovascular:     Rate and Rhythm: Normal rate and regular rhythm.     Heart sounds: Normal heart sounds. No murmur heard.   Pulmonary:     Effort: Pulmonary effort is normal. No respiratory distress.     Breath sounds: Normal breath sounds. No wheezing.  Musculoskeletal:        General: Normal range of motion.     Cervical back: Neck supple.  Lymphadenopathy:     Cervical: No cervical adenopathy.  Skin:    General: Skin is warm and dry.     Findings: No rash.  Neurological:     Mental Status: He is alert and oriented to person, place, and time.     Coordination: Coordination normal.  Psychiatric:        Behavior: Behavior normal.     Results for orders placed or performed in visit on 02/13/21  Bayer DCA Hb A1c Waived  Result Value Ref Range   HB A1C (BAYER DCA - WAIVED) 6.0 <7.0 %   Lipid panel  Result Value Ref Range   Cholesterol, Total 124 100 - 199 mg/dL   Triglycerides 145 0 - 149 mg/dL   HDL 28 (L) >39 mg/dL   VLDL Cholesterol Cal 26 5 - 40 mg/dL   LDL Chol Calc (NIH) 70 0 - 99 mg/dL   Chol/HDL Ratio 4.4 0.0 - 5.0 ratio  CMP14+EGFR  Result Value Ref Range   Glucose 84 65 - 99 mg/dL   BUN 12 6 - 24 mg/dL   Creatinine, Ser 0.89 0.76 - 1.27 mg/dL   eGFR 105 >59 mL/min/1.73   BUN/Creatinine Ratio 13 9 - 20   Sodium 139 134 - 144 mmol/L   Potassium 4.2 3.5 - 5.2 mmol/L   Chloride  99 96 - 106 mmol/L   CO2 23 20 - 29 mmol/L   Calcium 9.4 8.7 - 10.2 mg/dL   Total Protein 7.0 6.0 - 8.5 g/dL   Albumin 4.4 4.0 - 5.0 g/dL   Globulin, Total 2.6 1.5 - 4.5 g/dL   Albumin/Globulin Ratio 1.7 1.2 - 2.2   Bilirubin Total 0.4 0.0 - 1.2 mg/dL   Alkaline Phosphatase 98 44 - 121 IU/L   AST 18 0 - 40 IU/L   ALT 17 0 - 44 IU/L    Assessment & Plan:   Problem List Items Addressed This Visit      Other   Elevated blood pressure reading without diagnosis of hypertension   Hypertriglyceridemia - Primary   Hyperlipidemia   Prediabetes    Other Visit Diagnoses    Colon cancer screening       Relevant Orders   Ambulatory referral to Gastroenterology    He is still having some back pain but is seeing the surgeon for that says that he is hopeful that the surgery that he had will help him and is stable right now but he is hoping that it will improve in the near future  Continue other medicine currently, his blood work looked really good including his cholesterol.  He could increase his fish oils if he wants to increase his HDL.  Follow up plan: Return in about 6 months (around 08/18/2021), or if symptoms worsen or fail to improve, for Physical and hyperlipidemia and hypertension.  Counseling provided for all of the vaccine components No orders of the defined types were placed in this encounter.   [name], MD [facility] 02/15/2021,  3:31 PM

## 2021-02-22 ENCOUNTER — Ambulatory Visit: Payer: 59 | Admitting: "Endocrinology

## 2021-02-23 ENCOUNTER — Encounter: Payer: Self-pay | Admitting: Family Medicine

## 2021-02-28 ENCOUNTER — Ambulatory Visit
Admission: RE | Admit: 2021-02-28 | Discharge: 2021-02-28 | Disposition: A | Discharge status: Home / Self Care | Payer: 59 | Source: Ambulatory Visit | Attending: Student | Admitting: Student

## 2021-02-28 ENCOUNTER — Other Ambulatory Visit: Payer: Self-pay

## 2021-02-28 DIAGNOSIS — M5416 Radiculopathy, lumbar region: Secondary | ICD-10-CM

## 2021-02-28 MED ORDER — GADOBENATE DIMEGLUMINE 529 MG/ML IV SOLN
20.0000 mL | Freq: Once | INTRAVENOUS | Status: AC | PRN
  Administered 2021-02-28: 20 mL via INTRAVENOUS

## 2021-03-01 ENCOUNTER — Encounter: Payer: Self-pay | Admitting: "Endocrinology

## 2021-03-01 ENCOUNTER — Telehealth (INDEPENDENT_AMBULATORY_CARE_PROVIDER_SITE_OTHER): Payer: 59 | Admitting: "Endocrinology

## 2021-03-01 ENCOUNTER — Other Ambulatory Visit: Payer: Self-pay

## 2021-03-01 DIAGNOSIS — E291 Testicular hypofunction: Secondary | ICD-10-CM

## 2021-03-01 MED ORDER — TESTOSTERONE CYPIONATE 200 MG/ML IM SOLN: 100.0000 mg | INTRAMUSCULAR | 0 refills | Status: DC

## 2021-03-01 NOTE — Progress Notes (Signed)
03/01/2021                                    Endocrinology Telehealth Visit Follow up Note -During COVID -19 Pandemic  This visit type was conducted  via telephone due to national recommendations for restrictions regarding the COVID-19 Pandemic  in an effort to limit this patient's exposure and mitigate transmission of the corona virus.   I connected with Paul Parrish on 03/01/2021   by telephone and verified that I am speaking with the correct person using two identifiers. Paul Parrish, Jan 05, 1971. he has verbally consented to this visit.  I, Glade Lloyd , MD was in my office and patient , Paul Parrish , was in his residence. No other persons were with me during the encounter. All issues noted in this document were discussed and addressed. The format was not optimal for physical exam.   HPI: Paul Parrish is a 50 y.o.-year-old man.  - He is being seen in follow-up for  hypogonadism.  He was diagnosed with hypogonadism in 2014, etiology unclear.   -Due to polycythemia, he undergoes phlebotomy periodically.   He was advised to lower his testosterone to 100 mg every 14 days.  He recently underwent back surgery, recovering from his surgery at this time at home.  He has no new complaints.  His previsit labs show total testosterone of 323.      His previsit labs show improving polycythemia.   He reports steady energy level and wishes to continue on testosterone supplement.      -He reports history of seizures as a young man until his teenage years. -He denies any history of head injury.  - He also is recently diagnosed with sleep apnea currently on CPAP.  - He fathers 2 teenage boys biologically. - He has significant mood disorder on multiple antidepressants and antianxiety. No herbal medicines. -He denies family history of premature coronary artery disease.   ROS: Limited as above.  PE: There were no vitals taken for this visit. Wt Readings from Last 3 Encounters:  02/15/21 274 lb (124.3 kg)   10/26/20 294 lb (133.4 kg)  10/25/20 297 lb 6.4 oz (134.9 kg)     Recent Results (from the past 2160 hour(s))  QuantiFERON-TB Gold Plus     Status: None   Collection Time: 12/12/20  3:19 PM  Result Value Ref Range   QuantiFERON-TB Gold Plus NEGATIVE NEGATIVE    Comment: Negative test result. M. tuberculosis complex  infection unlikely.    NIL 0.06 IU/mL   Mitogen-NIL >10.00 IU/mL   TB1-NIL <0.00 IU/mL   TB2-NIL <0.00 IU/mL    Comment: . The Nil tube value reflects the background interferon gamma immune response of the patient's blood sample. This value has been subtracted from the patient's displayed TB and Mitogen results. . Lower than expected results with the Mitogen tube prevent false-negative Quantiferon readings by detecting a patient with a potential immune suppressive condition and/or suboptimal pre-analytical specimen handling. . The TB1 Antigen tube is coated with the M. tuberculosis-specific antigens designed to elicit responses from TB antigen primed CD4+ helper T-lymphocytes. . The TB2 Antigen tube is coated with the M. tuberculosis-specific antigens designed to elicit responses from TB antigen primed CD4+ helper and CD8+ cytotoxic T-lymphocytes. . For additional information, please refer to https://education.questdiagnostics.com/faq/FAQ204 (This link is being provided for informational/ educational purposes only.) .   Testosterone, Free, Total, SHBG  Status: None   Collection Time: 02/13/21  9:19 AM  Result Value Ref Range   Testosterone 323 264 - 916 ng/dL    Comment: Adult male reference interval is based on a population of healthy nonobese males (BMI <30) between 31 and 57 years old. [place], [place] [phone]. PMID: [id number].    Testosterone, Free 7.2 6.8 - 21.5 pg/mL   Sex Hormone Binding 29.2 16.5 - 55.9 nmol/L  CBC with Differential/Platelet     Status: None   Collection Time: 02/13/21  9:19 AM  Result Value Ref Range    WBC 8.7 3.4 - 10.8 x10E3/uL   RBC 4.86 4.14 - 5.80 x10E6/uL   Hemoglobin 14.5 13.0 - 17.7 g/dL   Hematocrit 42.2 37.5 - 51.0 %   MCV 87 79 - 97 fL   MCH 29.8 26.6 - 33.0 pg   MCHC 34.4 31.5 - 35.7 g/dL   RDW 11.8 11.6 - 15.4 %   Platelets 325 150 - 450 x10E3/uL   Neutrophils 59 Not Estab. %   Lymphs 30 Not Estab. %   Monocytes 9 Not Estab. %   Eos 1 Not Estab. %   Basos 1 Not Estab. %   Neutrophils Absolute 5.1 1.4 - 7.0 x10E3/uL   Lymphocytes Absolute 2.6 0.7 - 3.1 x10E3/uL   Monocytes Absolute 0.8 0.1 - 0.9 x10E3/uL   EOS (ABSOLUTE) 0.1 0.0 - 0.4 x10E3/uL   Basophils Absolute 0.1 0.0 - 0.2 x10E3/uL   Immature Granulocytes 0 Not Estab. %   Immature Grans (Abs) 0.0 0.0 - 0.1 x10E3/uL  PSA     Status: None   Collection Time: 02/13/21  9:19 AM  Result Value Ref Range   Prostate Specific Ag, Serum 0.5 0.0 - 4.0 ng/mL    Comment: Roche ECLIA methodology. According to the American Urological Association, Serum PSA should decrease and remain at undetectable levels after radical prostatectomy. The AUA defines biochemical recurrence as an initial PSA value 0.2 ng/mL or greater followed by a subsequent confirmatory PSA value 0.2 ng/mL or greater. Values obtained with different assay methods or kits cannot be used interchangeably. Results cannot be interpreted as absolute evidence of the presence or absence of malignant disease.   Bayer DCA Hb A1c Waived     Status: None   Collection Time: 02/13/21  9:19 AM  Result Value Ref Range   HB A1C (BAYER DCA - WAIVED) 6.0 <7.0 %    Comment:                                       Diabetic Adult            <7.0                                       Healthy Adult        4.3 - 5.7                                                           (DCCT/NGSP) American Diabetes Association's Summary of Glycemic Recommendations for Adults with Diabetes: Hemoglobin A1c <7.0%. More stringent glycemic goals (A1c <6.0%) may  further reduce complications at  the cost of increased risk of hypoglycemia.   Lipid panel     Status: Abnormal   Collection Time: 02/13/21  9:19 AM  Result Value Ref Range   Cholesterol, Total 124 100 - 199 mg/dL   Triglycerides 145 0 - 149 mg/dL   HDL 28 (L) >39 mg/dL   VLDL Cholesterol Cal 26 5 - 40 mg/dL   LDL Chol Calc (NIH) 70 0 - 99 mg/dL   Chol/HDL Ratio 4.4 0.0 - 5.0 ratio    Comment:                                   T. Chol/HDL Ratio                                             Men  Women                               1/2 Avg.Risk  3.4    3.3                                   Avg.Risk  5.0    4.4                                2X Avg.Risk  9.6    7.1                                3X Avg.Risk 23.4   11.0   CMP14+EGFR     Status: None   Collection Time: 02/13/21  9:19 AM  Result Value Ref Range   Glucose 84 65 - 99 mg/dL   BUN 12 6 - 24 mg/dL   Creatinine, Ser 0.89 0.76 - 1.27 mg/dL   eGFR 105 >59 mL/min/1.73   BUN/Creatinine Ratio 13 9 - 20   Sodium 139 134 - 144 mmol/L   Potassium 4.2 3.5 - 5.2 mmol/L   Chloride 99 96 - 106 mmol/L   CO2 23 20 - 29 mmol/L   Calcium 9.4 8.7 - 10.2 mg/dL   Total Protein 7.0 6.0 - 8.5 g/dL   Albumin 4.4 4.0 - 5.0 g/dL   Globulin, Total 2.6 1.5 - 4.5 g/dL   Albumin/Globulin Ratio 1.7 1.2 - 2.2   Bilirubin Total 0.4 0.0 - 1.2 mg/dL   Alkaline Phosphatase 98 44 - 121 IU/L   AST 18 0 - 40 IU/L   ALT 17 0 - 44 IU/L      ASSESSMENT: 1.  Hypogonadism  2. Erectile Dysfunction 3.  Polycythemia 4.  Prediabetes  PLAN:  1. Hypogonadism  -He continues to do phlebotomy every 56 days secondary polycythemia.  -More recently his CBC is consistent with normal counts.  His previsit labs show total testosterone of 323.     He wishes to be continued on testosterone supplement.  He will be continued on the same dose of testosterone,  testosterone  100 mg IM every 14 days , to give him a total monthly dose of 200 mg.   -  I had a long discussion with him about safe use of  testosterone, to avoid polycythemia.  His history of  secondary polycythemia which continues to require periodic phlebotomy, as well as sleep apnea requiring CPAP treatment making him a poor candidate for more generous testosterone replacement.  He reports less frequent hypoglycemia.  His point-of-care A1c was 5.9% consistent with prediabetes.  He has previous history of reactive hypoglycemia.  - he acknowledges that there is a room for improvement in his food and drink choices. - Suggestion is made for him to avoid simple carbohydrates  from his diet including Cakes, Sweet Desserts, Ice Cream, Soda (diet and regular), Sweet Tea, Candies, Chips, Cookies, Store Bought Juices, Alcohol in Excess of  1-2 drinks a day, Artificial Sweeteners,  Coffee Creamer, and "Sugar-free" Products, Lemonade. This will help patient to have more stable blood glucose profile and potentially avoid unintended weight gain.   He is advised to continue close follow-up with his PMD for primary care needs.    I spent 25 minutes in the care of the patient today including review of labs from greater function, CMP, and other relevant labs ; imaging/biopsy records (current and previous including abstractions from other facilities); face-to-face time discussing  his lab results and symptoms, medications doses, his options of short and long term treatment based on the latest standards of care / guidelines;   and documenting the encounter.  Paul Parrish  participated in the discussions, expressed understanding, and voiced agreement with the above plans.  All questions were answered to his satisfaction. he is encouraged to contact clinic should he have any questions or concerns prior to his return visit.   Glade Lloyd, MD Phone: [phone]  Fax: [phone]  -  This note was partially dictated with voice recognition software. Similar sounding words can be transcribed inadequately or may not  be corrected upon review.  03/01/2021,  6:01 PM

## 2021-03-08 ENCOUNTER — Other Ambulatory Visit (HOSPITAL_COMMUNITY): Payer: Self-pay | Admitting: Neurosurgery

## 2021-03-08 ENCOUNTER — Ambulatory Visit (HOSPITAL_COMMUNITY)
Admission: RE | Admit: 2021-03-08 | Discharge: 2021-03-08 | Disposition: A | Discharge status: Home / Self Care | Payer: 59 | Source: Ambulatory Visit | Attending: Neurosurgery | Admitting: Neurosurgery

## 2021-03-08 ENCOUNTER — Other Ambulatory Visit: Payer: Self-pay | Admitting: Neurosurgery

## 2021-03-08 ENCOUNTER — Other Ambulatory Visit: Payer: Self-pay

## 2021-03-08 DIAGNOSIS — X58XXXA Exposure to other specified factors, initial encounter: Secondary | ICD-10-CM | POA: Diagnosis not present

## 2021-03-08 DIAGNOSIS — T8149XA Infection following a procedure, other surgical site, initial encounter: Secondary | ICD-10-CM | POA: Insufficient documentation

## 2021-03-08 MED ORDER — LIDOCAINE HCL (PF) 1 % IJ SOLN
INTRAMUSCULAR | Status: AC
  Administered 2021-03-08: 2 mL via SUBCUTANEOUS
  Filled 2021-03-08: qty 30

## 2021-03-08 MED ORDER — VANCOMYCIN HCL IN DEXTROSE 1-5 GM/200ML-% IV SOLN
1000.0000 mg | Freq: Once | INTRAVENOUS | Status: AC
  Administered 2021-03-08: 1000 mg via INTRAVENOUS
  Filled 2021-03-08: qty 200

## 2021-03-08 MED ORDER — HEPARIN SOD (PORK) LOCK FLUSH 100 UNIT/ML IV SOLN
500.0000 [IU] | Freq: Once | INTRAVENOUS | Status: AC
  Administered 2021-03-08: 250 [IU] via INTRAVENOUS
  Filled 2021-03-08: qty 5

## 2021-03-08 MED ORDER — HEPARIN SOD (PORK) LOCK FLUSH 100 UNIT/ML IV SOLN
INTRAVENOUS | Status: AC
  Administered 2021-03-08: 5 [IU]
  Filled 2021-03-08: qty 5

## 2021-03-08 NOTE — Procedures (Signed)
PROCEDURE SUMMARY:  Successful placement of single lumen PICC line to right basilic vein. Length 49 cm Tip at lower SVC/RA PICC capped No complications Ready for use  EBL < 5 mL   Treasure [facility] Charlena Haub PA-C 03/08/2021, 3:45 PM

## 2021-03-14 ENCOUNTER — Encounter: Payer: Self-pay | Admitting: Family Medicine

## 2021-03-21 ENCOUNTER — Other Ambulatory Visit: Payer: Self-pay | Admitting: *Deleted

## 2021-03-21 MED ORDER — SKYRIZI 150 MG/ML ~~LOC~~ SOSY: 1.0000 "pen " | PREFILLED_SYRINGE | SUBCUTANEOUS | 3 refills | Status: DC

## 2021-03-21 MED ORDER — SKYRIZI PEN 150 MG/ML ~~LOC~~ SOAJ: 150.0000 mg | SUBCUTANEOUS | 3 refills | Status: DC

## 2021-04-04 ENCOUNTER — Encounter (HOSPITAL_COMMUNITY): Payer: Self-pay | Admitting: Psychiatry

## 2021-04-04 ENCOUNTER — Telehealth (INDEPENDENT_AMBULATORY_CARE_PROVIDER_SITE_OTHER): Payer: 59 | Admitting: Psychiatry

## 2021-04-04 ENCOUNTER — Other Ambulatory Visit: Payer: Self-pay

## 2021-04-04 DIAGNOSIS — F410 Panic disorder [episodic paroxysmal anxiety] without agoraphobia: Secondary | ICD-10-CM

## 2021-04-04 DIAGNOSIS — F4010 Social phobia, unspecified: Secondary | ICD-10-CM

## 2021-04-04 DIAGNOSIS — F331 Major depressive disorder, recurrent, moderate: Secondary | ICD-10-CM

## 2021-04-04 MED ORDER — CLOMIPRAMINE HCL 25 MG PO CAPS: 25.0000 mg | ORAL_CAPSULE | Freq: Two times a day (BID) | ORAL | 2 refills | Status: DC

## 2021-04-04 MED ORDER — TRAZODONE HCL 100 MG PO TABS: 100.0000 mg | ORAL_TABLET | Freq: Every evening | ORAL | 2 refills | Status: DC | PRN

## 2021-04-04 MED ORDER — LORAZEPAM 0.5 MG PO TABS: ORAL_TABLET | ORAL | 2 refills | Status: DC

## 2021-04-04 NOTE — Progress Notes (Signed)
Virtual Visit via Telephone Note  I connected with Paul Parrish on 04/04/21 at  3:00 PM EDT by telephone and verified that I am speaking with the correct person using two identifiers.  Location: Patient: Home Provider: Home Office   I discussed the limitations, risks, security and privacy concerns of performing an evaluation and management service by telephone and the availability of in person appointments. I also discussed with the patient that there may be a patient responsible charge related to this service. The patient expressed understanding and agreed to proceed.   History of Present Illness: Patient is evaluated by phone session.  He his having a lot of back issues.  He had a back surgery in April and he was doing fine until recently 6 weeks ago he had infection and he is now taking vancomycin.  He has a lot of pain and he is limited to do things.  He is not able to walk, drive and does not leave the house.  He feels his anxiety is okay because not leaving the house but he is overwhelmed because of the pain and lack of mobility.  He had stopped taking gabapentin because taking pain medicine.  He is trying to lose weight and he had lost more than 15 pounds.  He admitted lack of appetite and sometimes he does not like to eat because he is not doing exercise.  He is hoping once he feels better then he may resume his exercise.  He denies any crying spells, feeling of hopelessness or worthlessness.  Since he has not left the house in a while he does not feel as anxious.  He denies any major panic attack.  Patient works from home.  Past Psychiatric History:  H/O anxiety, depression and saw psychiatrist at neuropsychiatry in [place] but not happy with the treatment.  Saw Dr. Daron Offer in 2018 and prescribed Prozac, Xanax, Effexor, Celexa, propanolol, Lamictal, Vistaril, Adderall, Wellbutrin,  Cymbalta (increased irritability ), Anafranil and Luvox (did not help). H/O of ECT with no response.  No h/o  suicidal attempt or inpatient. At [facility] tried [place], Klonopin but did not work. H/O heavy drinking but claims to be sober.    Psychiatric Specialty Exam: Physical Exam  Review of Systems  Weight 254 lb (115.2 kg).There is no height or weight on file to calculate BMI.  General Appearance: NA  Eye Contact:  NA  Speech:  Normal Rate  Volume:  Normal  Mood:  Dysphoric  Affect:  NA  Thought Process:  Goal Directed  Orientation:  Full (Time, Place, and Person)  Thought Content:  WDL  Suicidal Thoughts:  No  Homicidal Thoughts:  No  Memory:  Immediate;   Good Recent;   Good Remote;   Good  Judgement:  Good  Insight:  Present  Psychomotor Activity:  NA  Concentration:  Concentration: Good and Attention Span: Good  Recall:  Good  Fund of Knowledge:  Good  Language:  Good  Akathisia:  No  Handed:  Right  AIMS (if indicated):     Assets:  Communication Skills Desire for Improvement Housing Resilience Social Support Talents/Skills  ADL's:  Intact  Cognition:  WNL  Sleep:   Fair      Assessment and Plan: Major depressive disorder, recurrent.  Social anxiety disorder.  Panic attack.  Patient had a back surgery but now he had an infection and taking antibiotic.  Reassurance given.  He has taken a few times lorazepam second dose but usually he takes 0.5  mg at bedtime with trazodone 100 mg at bedtime.  He does not want to change the medication since her symptoms are manageable.  We will continue Anafranil 25 mg twice a day, trazodone 100 mg at bedtime and lorazepam 0.5 mg daily if needed second dose.  Recommended to call us back if is any question or any concern.  Follow-up in 3 months.  Follow Up Instructions:    I discussed the assessment and treatment plan with the patient. The patient was provided an opportunity to ask questions and all were answered. The patient agreed with the plan and demonstrated an understanding of the instructions.   The patient was advised to  call back or seek an in-person evaluation if the symptoms worsen or if the condition fails to improve as anticipated.  I provided 18 minutes of non-face-to-face time during this encounter.   Kathlee Nations, MD

## 2021-06-15 ENCOUNTER — Other Ambulatory Visit: Payer: Self-pay | Admitting: Family Medicine

## 2021-06-15 DIAGNOSIS — N522 Drug-induced erectile dysfunction: Secondary | ICD-10-CM

## 2021-06-25 ENCOUNTER — Other Ambulatory Visit: Payer: Self-pay | Admitting: Neurosurgery

## 2021-06-25 ENCOUNTER — Other Ambulatory Visit (HOSPITAL_COMMUNITY): Payer: Self-pay | Admitting: Psychiatry

## 2021-06-25 DIAGNOSIS — F4010 Social phobia, unspecified: Secondary | ICD-10-CM

## 2021-06-25 DIAGNOSIS — M4646 Discitis, unspecified, lumbar region: Secondary | ICD-10-CM

## 2021-06-26 ENCOUNTER — Encounter: Payer: Self-pay | Admitting: Gastroenterology

## 2021-06-27 ENCOUNTER — Other Ambulatory Visit: Payer: Self-pay

## 2021-06-27 ENCOUNTER — Other Ambulatory Visit: Payer: 59

## 2021-06-28 ENCOUNTER — Other Ambulatory Visit (HOSPITAL_COMMUNITY): Payer: Self-pay | Admitting: Psychiatry

## 2021-06-28 DIAGNOSIS — F331 Major depressive disorder, recurrent, moderate: Secondary | ICD-10-CM

## 2021-07-01 LAB — CBC WITH DIFFERENTIAL/PLATELET
Basophils Absolute: 0.1 10*3/uL (ref 0.0–0.2)
Basos: 1 %
EOS (ABSOLUTE): 0.2 10*3/uL (ref 0.0–0.4)
Eos: 3 %
Hematocrit: 53.3 % — ABNORMAL HIGH (ref 37.5–51.0)
Hemoglobin: 17.9 g/dL — ABNORMAL HIGH (ref 13.0–17.7)
Immature Grans (Abs): 0 10*3/uL (ref 0.0–0.1)
Immature Granulocytes: 0 %
Lymphocytes Absolute: 2.3 10*3/uL (ref 0.7–3.1)
Lymphs: 39 %
MCH: 29.5 pg (ref 26.6–33.0)
MCHC: 33.6 g/dL (ref 31.5–35.7)
MCV: 88 fL (ref 79–97)
Monocytes Absolute: 0.8 10*3/uL (ref 0.1–0.9)
Monocytes: 14 %
Neutrophils Absolute: 2.5 10*3/uL (ref 1.4–7.0)
Neutrophils: 43 %
Platelets: 223 10*3/uL (ref 150–450)
RBC: 6.07 x10E6/uL — ABNORMAL HIGH (ref 4.14–5.80)
RDW: 13.2 % (ref 11.6–15.4)
WBC: 5.9 10*3/uL (ref 3.4–10.8)

## 2021-07-01 LAB — TESTOSTERONE, FREE, TOTAL, SHBG
Sex Hormone Binding: 41.1 nmol/L (ref 19.3–76.4)
Testosterone, Free: 5.4 pg/mL — ABNORMAL LOW (ref 7.2–24.0)
Testosterone: 329 ng/dL (ref 264–916)

## 2021-07-03 ENCOUNTER — Encounter: Payer: Self-pay | Admitting: "Endocrinology

## 2021-07-03 ENCOUNTER — Other Ambulatory Visit: Payer: Self-pay

## 2021-07-03 ENCOUNTER — Ambulatory Visit (INDEPENDENT_AMBULATORY_CARE_PROVIDER_SITE_OTHER): Payer: 59 | Admitting: "Endocrinology

## 2021-07-03 ENCOUNTER — Other Ambulatory Visit (HOSPITAL_COMMUNITY): Payer: Self-pay | Admitting: *Deleted

## 2021-07-03 VITALS — BP 120/82 | HR 88 | Ht 75.0 in | Wt 273.0 lb

## 2021-07-03 DIAGNOSIS — E291 Testicular hypofunction: Secondary | ICD-10-CM

## 2021-07-03 DIAGNOSIS — F4010 Social phobia, unspecified: Secondary | ICD-10-CM

## 2021-07-03 MED ORDER — TRAZODONE HCL 100 MG PO TABS: 100.0000 mg | ORAL_TABLET | Freq: Every evening | ORAL | 0 refills | Status: DC | PRN

## 2021-07-03 NOTE — Progress Notes (Signed)
07/03/2021      Endocrinology follow-up note   HPI: Paul Parrish is a 50 y.o.-year-old man.  - He is being seen in follow-up for  hypogonadism.  He was diagnosed with hypogonadism in 2014, etiology unclear.   -Due to polycythemia, he undergoes phlebotomy periodically.   He was advised to lower his testosterone to 100 mg every 14 days.  He recently underwent back surgery, recovering from his surgery at this time at home.  He has no new complaints.  His previsit labs show total testosterone of 329, however has associated elevated hemoglobin of 17.9, and hematocrit of 53.3.  This lab work was done the morning before his injection.    His previsit labs show improving polycythemia.   He reports steady energy level and wishes to continue on testosterone supplement.      -He reports history of seizures as a young man until his teenage years. -He denies any history of head injury.  - He also is recently diagnosed with sleep apnea currently on CPAP.  - He fathers 2 teenage boys biologically. - He has significant mood disorder on multiple antidepressants and antianxiety. No herbal medicines. -He denies family history of premature coronary artery disease.   ROS: Limited as above.  PE: BP 120/82   Pulse 88   Ht 6\' 3"  (1.905 m)   Wt 273 lb (123.8 kg)   BMI 34.12 kg/m  Wt Readings from Last 3 Encounters:  07/03/21 273 lb (123.8 kg)  02/15/21 274 lb (124.3 kg)  10/26/20 294 lb (133.4 kg)     Recent Results (from the past 2160 hour(s))  Testosterone, Free, Total, SHBG     Status: Abnormal   Collection Time: 06/27/21  9:16 AM  Result Value Ref Range   Testosterone 329 264 - 916 ng/dL    Comment: Adult male reference interval is based on a population of healthy nonobese males (BMI <30) between 25 and 64 years old. Outlook, [place] [phone]. PMID: [id number].    Testosterone, Free 5.4 (L) 7.2 - 24.0 pg/mL   Sex Hormone Binding 41.1 19.3 - 76.4 nmol/L  CBC with  Differential/Platelet     Status: Abnormal   Collection Time: 06/27/21  9:16 AM  Result Value Ref Range   WBC 5.9 3.4 - 10.8 x10E3/uL   RBC 6.07 (H) 4.14 - 5.80 x10E6/uL   Hemoglobin 17.9 (H) 13.0 - 17.7 g/dL   Hematocrit 53.3 (H) 37.5 - 51.0 %   MCV 88 79 - 97 fL   MCH 29.5 26.6 - 33.0 pg   MCHC 33.6 31.5 - 35.7 g/dL   RDW 13.2 11.6 - 15.4 %   Platelets 223 150 - 450 x10E3/uL   Neutrophils 43 Not Estab. %   Lymphs 39 Not Estab. %   Monocytes 14 Not Estab. %   Eos 3 Not Estab. %   Basos 1 Not Estab. %   Neutrophils Absolute 2.5 1.4 - 7.0 x10E3/uL   Lymphocytes Absolute 2.3 0.7 - 3.1 x10E3/uL   Monocytes Absolute 0.8 0.1 - 0.9 x10E3/uL   EOS (ABSOLUTE) 0.2 0.0 - 0.4 x10E3/uL   Basophils Absolute 0.1 0.0 - 0.2 x10E3/uL   Immature Granulocytes 0 Not Estab. %   Immature Grans (Abs) 0.0 0.0 - 0.1 x10E3/uL      ASSESSMENT: 1.  Hypogonadism  2. Erectile Dysfunction 3.  Polycythemia 4.  Prediabetes  PLAN:  1. Hypogonadism  -He continues to do phlebotomy every 56 days secondary polycythemia.  -More recently his CBC is consistent  with polycythemia.  His total testosterone is 329.      He wishes to be continued on testosterone supplement.  He will be continued on a lower dose of testosterone.  He is advised to lower his testosterone to 50 mg IM every 14 days to give him a total monthly dose of 100 mg.      -I had a long discussion with him about safe use of testosterone, to avoid polycythemia.  His history of  secondary polycythemia which continues to require periodic phlebotomy, as well as sleep apnea requiring CPAP treatment making him a poor candidate for more generous testosterone replacement. -   He has previous history of reactive hypoglycemia.  - he acknowledges that there is a room for improvement in his food and drink choices. - Suggestion is made for him to avoid simple carbohydrates  from his diet including Cakes, Sweet Desserts, Ice Cream, Soda (diet and regular),  Sweet Tea, Candies, Chips, Cookies, Store Bought Juices, Alcohol in Excess of  1-2 drinks a day, Artificial Sweeteners,  Coffee Creamer, and "Sugar-free" Products, Lemonade. This will help patient to have more stable blood glucose profile and potentially avoid unintended weight gain.   He is advised to continue close follow-up with his PMD for primary care needs.    I spent 21 minutes in the care of the patient today including review of labs from Thyroid Function, CMP, and other relevant labs ; imaging/biopsy records (current and previous including abstractions from other facilities); face-to-face time discussing  his lab results and symptoms, medications doses, his options of short and long term treatment based on the latest standards of care / guidelines;   and documenting the encounter.  [name]  participated in the discussions, expressed understanding, and voiced agreement with the above plans.  All questions were answered to his satisfaction. he is encouraged to contact clinic should he have any questions or concerns prior to his return visit.   Glade Lloyd, MD Phone: [phone]  Fax: [phone]  -  This note was partially dictated with voice recognition software. Similar sounding words can be transcribed inadequately or may not  be corrected upon review.  07/03/2021, 4:14 PM

## 2021-07-05 ENCOUNTER — Telehealth (HOSPITAL_BASED_OUTPATIENT_CLINIC_OR_DEPARTMENT_OTHER): Payer: 59 | Admitting: Psychiatry

## 2021-07-05 ENCOUNTER — Other Ambulatory Visit: Payer: Self-pay

## 2021-07-05 ENCOUNTER — Encounter (HOSPITAL_COMMUNITY): Payer: Self-pay | Admitting: Psychiatry

## 2021-07-05 DIAGNOSIS — F4010 Social phobia, unspecified: Secondary | ICD-10-CM | POA: Diagnosis not present

## 2021-07-05 DIAGNOSIS — F410 Panic disorder [episodic paroxysmal anxiety] without agoraphobia: Secondary | ICD-10-CM

## 2021-07-05 DIAGNOSIS — F331 Major depressive disorder, recurrent, moderate: Secondary | ICD-10-CM | POA: Diagnosis not present

## 2021-07-05 MED ORDER — TRAZODONE HCL 100 MG PO TABS: 100.0000 mg | ORAL_TABLET | Freq: Every evening | ORAL | 0 refills | Status: DC | PRN

## 2021-07-05 MED ORDER — LORAZEPAM 0.5 MG PO TABS: ORAL_TABLET | ORAL | 2 refills | Status: DC

## 2021-07-05 MED ORDER — CLOMIPRAMINE HCL 25 MG PO CAPS: 25.0000 mg | ORAL_CAPSULE | Freq: Two times a day (BID) | ORAL | 2 refills | Status: DC

## 2021-07-05 NOTE — Progress Notes (Signed)
Virtual Visit via Telephone Note  I connected with Paul Parrish on 07/05/21 at  3:00 PM EDT by telephone and verified that I am speaking with the correct person using two identifiers.  Location: Patient: Home Provider: Home Office   I discussed the limitations, risks, security and privacy concerns of performing an evaluation and management service by telephone and the availability of in person appointments. I also discussed with the patient that there may be a patient responsible charge related to this service. The patient expressed understanding and agreed to proceed.   History of Present Illness: Patient is evaluated by phone session.  His back pain is somewhat better but is still had chronic pain which sometimes clears up.  Still have anxiety when he go outside and he usually go with his wife.  His job is going well.  He is working from home.  He is somewhat anxious because recently his physician told to have a CT scan of his back to see the progress and if his recovery is not going well then he may need another surgery.  He admitted that concerns and stress him out.  He is taking melatonin with trazodone at bedtime which helps his sleep.  He admitted taking occasionally second Ativan to calm himself.  He has no tremors, shakes or any EPS.  His appetite is increased since his pain is better and he is eating more and trying to be more active.  Denies any panic attack.  He feels his depression is a stable.  He denies drinking or using any illegal substances.  Past Psychiatric History:  H/O anxiety, depression and saw psychiatrist at neuropsychiatry in [place] but not happy with the treatment.  Saw Dr. Daron Offer in 2018 and prescribed Prozac, Xanax, Effexor, Celexa, propanolol, Lamictal, Vistaril, Adderall, Wellbutrin,  Cymbalta (increased irritability ), Anafranil and Luvox (did not help). H/O of ECT with no response.  No h/o suicidal attempt or inpatient. At [facility] tried [place], Klonopin  but did not work. H/O heavy drinking but claims to be sober.    Psychiatric Specialty Exam: Physical Exam  Review of Systems  Weight 270 lb (122.5 kg).There is no height or weight on file to calculate BMI.  General Appearance: NA  Eye Contact:  NA  Speech:  Clear and Coherent  Volume:  Normal  Mood:  Anxious  Affect:  NA  Thought Process:  Coherent  Orientation:  Full (Time, Place, and Person)  Thought Content:  Logical  Suicidal Thoughts:  No  Homicidal Thoughts:  No  Memory:  Immediate;   Good Recent;   Good Remote;   Good  Judgement:  Intact  Insight:  NA  Psychomotor Activity:  NA  Concentration:  Concentration: Good and Attention Span: Good  Recall:  Good  Fund of Knowledge:  Good  Language:  Good  Akathisia:  No  Handed:  Right  AIMS (if indicated):     Assets:  Communication Skills Desire for Improvement Housing Social Support  ADL's:  Intact  Cognition:  WNL  Sleep:   ok      Assessment and Plan: Major depressive disorder, recurrent.  Social anxiety disorder.  Panic attacks.  Patient is given about his back pain and upcoming CT scan.  Patient like to go up on Ativan.  Recommended and discussed about benzodiazepine dependence tolerance and withdrawal.  He is taking 0.5 mg daily and second as needed.  We will provide 10 extra tablet but reminded not to take every day to build a  tolerance.  Patient agreed with the plan.  Continue trazodone 100 mg at bedtime, Anafranil 25 mg twice a day and he will take the Ativan 0.5 mg 2 times a day as needed.  Recommended to call us back if is any question or any concern.  Follow-up in 3 months.  Follow Up Instructions:    I discussed the assessment and treatment plan with the patient. The patient was provided an opportunity to ask questions and all were answered. The patient agreed with the plan and demonstrated an understanding of the instructions.   The patient was advised to call back or seek an in-person evaluation if  the symptoms worsen or if the condition fails to improve as anticipated.  I provided 16 minutes of non-face-to-face time during this encounter.   Kathlee Nations, MD

## 2021-07-10 ENCOUNTER — Encounter: Payer: Self-pay | Admitting: Dermatology

## 2021-07-11 ENCOUNTER — Ambulatory Visit
Admission: RE | Admit: 2021-07-11 | Discharge: 2021-07-11 | Disposition: A | Discharge status: Home / Self Care | Payer: 59 | Source: Ambulatory Visit | Attending: Neurosurgery | Admitting: Neurosurgery

## 2021-07-11 DIAGNOSIS — M4646 Discitis, unspecified, lumbar region: Secondary | ICD-10-CM

## 2021-07-25 ENCOUNTER — Ambulatory Visit (AMBULATORY_SURGERY_CENTER): Payer: 59

## 2021-07-25 ENCOUNTER — Other Ambulatory Visit: Payer: Self-pay

## 2021-07-25 VITALS — Ht 74.0 in | Wt 272.0 lb

## 2021-07-25 DIAGNOSIS — Z1211 Encounter for screening for malignant neoplasm of colon: Secondary | ICD-10-CM

## 2021-07-25 MED ORDER — PEG-KCL-NACL-NASULF-NA ASC-C 100 G PO SOLR: 1.0000 | Freq: Once | ORAL | 0 refills | Status: AC

## 2021-07-25 NOTE — Progress Notes (Signed)
No egg or soy allergy known to patient  No issues known to pt with past sedation with any surgeries or procedures Patient denies ever being told they had issues or difficulty with intubation  No FH of Malignant Hyperthermia Pt is not on diet pills Pt is not on home 02  Pt is not on blood thinners  Pt denies issues with constipation  No A fib or A flutter Pt is fully vaccinated for Covid x 2; NO PA's for preps discussed with pt in [facility] today  Discussed with pt there will be an out-of-pocket cost for prep and that varies from $0 to 70 +  dollars - pt verbalized understanding  Due to the COVID-19 pandemic we are asking patients to follow certain guidelines in PV and the [facility]   Pt aware of COVID protocols and [facility] guidelines

## 2021-08-10 ENCOUNTER — Encounter: Payer: Self-pay | Admitting: Gastroenterology

## 2021-08-17 ENCOUNTER — Encounter: Payer: Self-pay | Admitting: Family Medicine

## 2021-08-17 ENCOUNTER — Ambulatory Visit (AMBULATORY_SURGERY_CENTER): Payer: 59 | Admitting: Gastroenterology

## 2021-08-17 ENCOUNTER — Encounter: Payer: Self-pay | Admitting: Gastroenterology

## 2021-08-17 ENCOUNTER — Ambulatory Visit (INDEPENDENT_AMBULATORY_CARE_PROVIDER_SITE_OTHER): Payer: 59 | Admitting: Family Medicine

## 2021-08-17 ENCOUNTER — Other Ambulatory Visit: Payer: Self-pay

## 2021-08-17 VITALS — BP 139/88 | HR 120 | Ht 74.0 in | Wt 269.0 lb

## 2021-08-17 VITALS — BP 123/71 | HR 86 | Temp 97.8°F | Resp 21 | Ht 74.0 in | Wt 272.0 lb

## 2021-08-17 DIAGNOSIS — Z1211 Encounter for screening for malignant neoplasm of colon: Secondary | ICD-10-CM

## 2021-08-17 DIAGNOSIS — N529 Male erectile dysfunction, unspecified: Secondary | ICD-10-CM

## 2021-08-17 DIAGNOSIS — I10 Essential (primary) hypertension: Secondary | ICD-10-CM

## 2021-08-17 DIAGNOSIS — E782 Mixed hyperlipidemia: Secondary | ICD-10-CM

## 2021-08-17 DIAGNOSIS — Z125 Encounter for screening for malignant neoplasm of prostate: Secondary | ICD-10-CM

## 2021-08-17 DIAGNOSIS — E781 Pure hyperglyceridemia: Secondary | ICD-10-CM

## 2021-08-17 DIAGNOSIS — R7303 Prediabetes: Secondary | ICD-10-CM | POA: Diagnosis not present

## 2021-08-17 DIAGNOSIS — D123 Benign neoplasm of transverse colon: Secondary | ICD-10-CM

## 2021-08-17 DIAGNOSIS — N522 Drug-induced erectile dysfunction: Secondary | ICD-10-CM

## 2021-08-17 HISTORY — PX: COLONOSCOPY: SHX174

## 2021-08-17 MED ORDER — SODIUM CHLORIDE 0.9 % IV SOLN: 500.0000 mL | Freq: Once | INTRAVENOUS | Status: DC

## 2021-08-17 MED ORDER — ROSUVASTATIN CALCIUM 10 MG PO TABS: 10.0000 mg | ORAL_TABLET | Freq: Every day | ORAL | 3 refills | Status: DC

## 2021-08-17 MED ORDER — SILDENAFIL CITRATE 20 MG PO TABS: 20.0000 mg | ORAL_TABLET | Freq: Every day | ORAL | 1 refills | Status: DC | PRN

## 2021-08-17 NOTE — Op Note (Signed)
[facility] Patient Name: Paul Parrish Procedure Date: 08/17/2021 12:27 PM MRN: [id number] Endoscopist: Remo Lipps P. Havery Moros , MD Age: 50 Referring MD:  Date of Birth: 08/03/1971 Gender: Male Account #: [id number] Procedure:                Colonoscopy Indications:              Screening for colorectal malignant neoplasm, This                            is the patient's first colonoscopy Medicines:                Monitored Anesthesia Care Procedure:                Pre-Anesthesia Assessment:                           - Prior to the procedure, a History and Physical                            was performed, and patient medications and                            allergies were reviewed. The patient's tolerance of                            previous anesthesia was also reviewed. The risks                            and benefits of the procedure and the sedation                            options and risks were discussed with the patient.                            All questions were answered, and informed consent                            was obtained. Prior Anticoagulants: The patient has                            taken no previous anticoagulant or antiplatelet                            agents. ASA Grade Assessment: III - A patient with                            severe systemic disease. After reviewing the risks                            and benefits, the patient was deemed in                            satisfactory condition to undergo the procedure.  After obtaining informed consent, the colonoscope                            was passed under direct vision. Throughout the                            procedure, the patient's blood pressure, pulse, and                            oxygen saturations were monitored continuously. The                            CF [id number] #[id number] was introduced through the anus                            and advanced to  the the cecum, identified by                            appendiceal orifice and ileocecal valve. The                            colonoscopy was performed without difficulty. The                            patient tolerated the procedure well. The quality                            of the bowel preparation was good. The ileocecal                            valve, appendiceal orifice, and rectum were                            photographed. Scope In: 12:37:39 PM Scope Out: 1:01:51 PM Scope Withdrawal Time: 0 hours 14 minutes 47 seconds  Total Procedure Duration: 0 hours 24 minutes 12 seconds  Findings:                 The perianal and digital rectal examinations were                            normal.                           A 3 mm polyp was found in the transverse colon. The                            polyp was sessile. The polyp was removed with a                            cold snare. Resection and retrieval were complete.                           Multiple small-mouthed diverticula were found in  the sigmoid colon.                           Internal hemorrhoids were found during                            retroflexion. The hemorrhoids were small.                           The colon revealed excessive looping in the right                            colon - abdominal pressure was needed to achieve                            cecal intubation.                           The exam was otherwise without abnormality. Complications:            No immediate complications. Estimated blood loss:                            Minimal. Estimated Blood Loss:     Estimated blood loss was minimal. Impression:               - One 3 mm polyp in the transverse colon, removed                            with a cold snare. Resected and retrieved.                           - Diverticulosis in the sigmoid colon.                           - Internal hemorrhoids.                            - There was significant looping of the colon.                           - The examination was otherwise normal. Recommendation:           - Patient has a contact number available for                            emergencies. The signs and symptoms of potential                            delayed complications were discussed with the                            patient. Return to normal activities tomorrow.                            Written discharge instructions were provided to the  patient.                           - Resume previous diet.                           - Continue present medications.                           - Await pathology results. Remo Lipps P. Joshu Furukawa, MD 08/17/2021 1:06:40 PM This report has been signed electronically.

## 2021-08-17 NOTE — Progress Notes (Signed)
BP 139/88   Pulse (!) 120   Ht 6' 2"  (1.88 m)   Wt 269 lb (122 kg)   SpO2 95%   BMI 34.54 kg/m    Subjective:   Patient ID: Paul Parrish, male    DOB: 25-Apr-1971, 50 y.o.   MRN: [id number]  HPI: Paul Parrish is a 50 y.o. male presenting on 08/17/2021 for Medical Management of Chronic Issues and Hyperlipidemia   HPI Hypertension Patient is currently on no medication currently, diet controlled and monitoring, and their blood pressure today is 139/88. Patient denies any lightheadedness or dizziness. Patient denies headaches, blurred vision, chest pains, shortness of breath, or weakness. Denies any side effects from medication and is content with current medication.   Hyperlipidemia Patient is coming in for recheck of his hyperlipidemia. The patient is currently taking Crestor. They deny any issues with myalgias or history of liver damage from it. They deny any focal numbness or weakness or chest pain.   Prediabetes  patient comes in today for recheck of his diabetes. Patient has been currently taking diet controlled. Patient is not currently on an ACE inhibitor/ARB. Patient has not seen an ophthalmologist this year. Patient denies any new issues with their feet. The symptom started onset as an adult hypertension and hyperlipidemia ARE RELATED TO DM   Erectile dysfunction and prostate recheck Patient sees endocrinology for testosterone and also takes medicine for erectile dysfunction and the sildenafil and says it works pretty well for him.  Relevant past medical, surgical, family and social history reviewed and updated as indicated. Interim medical history since our last visit reviewed. Allergies and medications reviewed and updated.  Review of Systems  Constitutional:  Negative for chills and fever.  Eyes:  Negative for visual disturbance.  Respiratory:  Negative for shortness of breath and wheezing.   Cardiovascular:  Negative for chest pain and leg swelling.  Musculoskeletal:   Positive for arthralgias and back pain. Negative for gait problem.  Skin:  Negative for rash.  All other systems reviewed and are negative.  Per HPI unless specifically indicated above   Allergies as of 08/17/2021       Reactions   Amoxicillin Other (See Comments)   High fever and possible heart racing   Bactrim [sulfamethoxazole-trimethoprim] Other (See Comments)   Fever, body aches, chills   Penicillins Other (See Comments)   High fever Has patient had a PCN reaction causing immediate rash, facial/tongue/throat swelling, SOB or lightheadedness with hypotension: No Has patient had a PCN reaction causing severe rash involving mucus membranes or skin necrosis: No Has patient had a PCN reaction that required hospitalization No Has patient had a PCN reaction occurring within the last 10 years: No If all of the above answers are "NO", then may proceed with Cephalosporin use.        Medication List        Accurate as of August 17, 2021  4:10 PM. If you have any questions, ask your nurse or doctor.          clomiPRAMINE 25 MG capsule Commonly known as: ANAFRANIL Take 1 capsule (25 mg total) by mouth 2 (two) times daily.   Fish Oil 1000 MG Caps Take 1 capsule by mouth 2 (two) times daily.   LORazepam 0.5 MG tablet Commonly known as: Ativan Take one tab daily as needed for anxiety and 2nd if needed again   Melatonin 10 MG Tabs Take 1 tablet by mouth at bedtime.   oxyCODONE 5 MG immediate release  tablet Commonly known as: Oxy IR/ROXICODONE Take 5 mg by mouth every 6 (six) hours as needed.   rosuvastatin 10 MG tablet Commonly known as: Crestor Take 1 tablet (10 mg total) by mouth daily.   sildenafil 20 MG tablet Commonly known as: REVATIO Take 1 tablet (20 mg total) by mouth daily as needed. What changed: See the new instructions. Changed by: Worthy Rancher, MD   Skyrizi 150 MG/ML Sosy Generic drug: Risankizumab-rzaa Inject 1 pen into the skin every 3  (three) months.   Skyrizi Pen 150 MG/ML Soaj Generic drug: Risankizumab-rzaa Inject 150 mg into the skin as directed. Every 12 weeks for maintenance.   SYRINGE-NEEDLE (DISP) 3 ML 21G X 1-1/2" 3 ML Misc Use to inject testosterone every week   Testosterone Cypionate 100 MG/ML Soln Inject 0.5 mLs as directed every 14 (fourteen) days.   traZODone 100 MG tablet Commonly known as: DESYREL Take 1 tablet (100 mg total) by mouth at bedtime as needed for sleep.         Objective:   BP 139/88   Pulse (!) 120   Ht 6' 2"  (1.88 m)   Wt 269 lb (122 kg)   SpO2 95%   BMI 34.54 kg/m   Wt Readings from Last 3 Encounters:  08/17/21 269 lb (122 kg)  08/17/21 272 lb (123.4 kg)  07/25/21 272 lb (123.4 kg)    Physical Exam Vitals and nursing note reviewed.  Constitutional:      General: He is not in acute distress.    Appearance: He is well-developed. He is not diaphoretic.  Eyes:     General: No scleral icterus.    Conjunctiva/sclera: Conjunctivae normal.  Neck:     Thyroid: No thyromegaly.  Cardiovascular:     Rate and Rhythm: Normal rate and regular rhythm.     Heart sounds: Normal heart sounds. No murmur heard. Pulmonary:     Effort: Pulmonary effort is normal. No respiratory distress.     Breath sounds: Normal breath sounds. No wheezing.  Musculoskeletal:        General: No swelling. Normal range of motion.     Cervical back: Neck supple.  Lymphadenopathy:     Cervical: No cervical adenopathy.  Skin:    General: Skin is warm and dry.     Findings: No rash.  Neurological:     Mental Status: He is alert and oriented to person, place, and time.     Coordination: Coordination normal.  Psychiatric:        Behavior: Behavior normal.      Assessment & Plan:   Problem List Items Addressed This Visit       Cardiovascular and Mediastinum   Hypertension - Primary   Relevant Medications   rosuvastatin (CRESTOR) 10 MG tablet   sildenafil (REVATIO) 20 MG tablet   Other  Relevant Orders   CMP14+EGFR     Other   Erectile dysfunction   Relevant Medications   sildenafil (REVATIO) 20 MG tablet   Other Relevant Orders   Testosterone,Free and Total   Hypertriglyceridemia   Relevant Medications   rosuvastatin (CRESTOR) 10 MG tablet   sildenafil (REVATIO) 20 MG tablet   Other Relevant Orders   Lipid panel   Prediabetes   Relevant Orders   Bayer DCA Hb A1c Waived   Hyperlipidemia   Relevant Medications   rosuvastatin (CRESTOR) 10 MG tablet   sildenafil (REVATIO) 20 MG tablet   Other Relevant Orders   Lipid panel   Other  Visit Diagnoses     Prostate cancer screening       Relevant Orders   PSA, total and free       Patient is still seeing back surgeon working on his back, will do some blood work today and also do some blood work for his endocrinology.  We will follow-up on the blood work Follow up plan: Return in about 6 months (around 02/14/2022), or if symptoms worsen or fail to improve, for Hypertension and cholesterol and prediabetes recheck.  Counseling provided for all of the vaccine components Orders Placed This Encounter  Procedures   Bayer [place] Hb A1c Waived   CMP14+EGFR   Lipid panel   PSA, total and free   Testosterone,Free and Total    [name], MD [facility] 08/17/2021, 4:10 PM

## 2021-08-17 NOTE — Progress Notes (Signed)
Sedate, gd SR, tolerated procedure well, VSS, report to RN 

## 2021-08-17 NOTE — Progress Notes (Signed)
Called to room to assist during endoscopic procedure.  Patient ID and intended procedure confirmed with present staff. Received instructions for my participation in the procedure from the performing physician.  

## 2021-08-17 NOTE — Patient Instructions (Signed)
Information on polyps, diverticulosis and hemorrhoids given to you today.  Await pathology results.  Resume previous diet and hemorrhoids.   YOU HAD AN ENDOSCOPIC PROCEDURE TODAY AT [facility]:   Refer to the procedure report that was given to you for any specific questions about what was found during the examination.  If the procedure report does not answer your questions, please call your gastroenterologist to clarify.  If you requested that your care partner not be given the details of your procedure findings, then the procedure report has been included in a sealed envelope for you to review at your convenience later.  YOU SHOULD EXPECT: Some feelings of bloating in the abdomen. Passage of more gas than usual.  Walking can help get rid of the air that was put into your GI tract during the procedure and reduce the bloating. If you had a lower endoscopy (such as a colonoscopy or flexible sigmoidoscopy) you may notice spotting of blood in your stool or on the toilet paper. If you underwent a bowel prep for your procedure, you may not have a normal bowel movement for a few days.  Please Note:  You might notice some irritation and congestion in your nose or some drainage.  This is from the oxygen used during your procedure.  There is no need for concern and it should clear up in a day or so.  SYMPTOMS TO REPORT IMMEDIATELY:  Following lower endoscopy (colonoscopy or flexible sigmoidoscopy):  Excessive amounts of blood in the stool  Significant tenderness or worsening of abdominal pains  Swelling of the abdomen that is new, acute  Fever of 100F or higher  For urgent or emergent issues, a gastroenterologist can be reached at any hour by calling [phone]. Do not use MyChart messaging for urgent concerns.    DIET:  We do recommend a small meal at first, but then you may proceed to your regular diet.  Drink plenty of fluids but you should avoid alcoholic beverages for 24  hours.  ACTIVITY:  You should plan to take it easy for the rest of today and you should NOT DRIVE or use heavy machinery until tomorrow (because of the sedation medicines used during the test).    FOLLOW UP: Our staff will call the number listed on your records 48-72 hours following your procedure to check on you and address any questions or concerns that you may have regarding the information given to you following your procedure. If we do not reach you, we will leave a message.  We will attempt to reach you two times.  During this call, we will ask if you have developed any symptoms of COVID 19. If you develop any symptoms (ie: fever, flu-like symptoms, shortness of breath, cough etc.) before then, please call [phone].  If you test positive for Covid 19 in the 2 weeks post procedure, please call and report this information to [place].    If any biopsies were taken you will be contacted by phone or by letter within the next 1-3 weeks.  Please call us at [phone] if you have not heard about the biopsies in 3 weeks.    SIGNATURES/CONFIDENTIALITY: You and/or your care partner have signed paperwork which will be entered into your electronic medical record.  These signatures attest to the fact that that the information above on your After Visit Summary has been reviewed and is understood.  Full responsibility of the confidentiality of this discharge information lies with you and/or  your care-partner.

## 2021-08-17 NOTE — Progress Notes (Signed)
[facility] History and Physical   Primary Care Physician:  Dettinger, Fransisca Kaufmann, MD   Reason for Procedure:   Colon cancer screening  Plan:    colonoscopy     HPI: Paul Parrish is a 50 y.o. male  here for colonoscopy screening. Patient denies any bowel symptoms at this time. No family history of colon cancer known. Otherwise feels well without any cardiopulmonary symptoms.    Past Medical History:  Diagnosis Date   Anxiety    on meds   Depression    on meds   Elevated hemoglobin ([place])    Low testosterone    Sleep apnea    uses CPAP   Small bowel obstruction ([place]) 10/21/2016    Past Surgical History:  Procedure Laterality Date   LUMBAR SPINE SURGERY  12/2020   NASAL SINUS SURGERY  02/2016   WISDOM TOOTH EXTRACTION      Prior to Admission medications   Medication Sig Start Date End Date Taking? Authorizing Provider  clomiPRAMINE (ANAFRANIL) 25 MG capsule Take 1 capsule (25 mg total) by mouth 2 (two) times daily. 07/05/21 07/05/22 Yes Arfeen, Arlyce Harman, MD  LORazepam (ATIVAN) 0.5 MG tablet Take one tab daily as needed for anxiety and 2nd if needed again 07/05/21  Yes Arfeen, Arlyce Harman, MD  Melatonin 10 MG TABS Take 1 tablet by mouth at bedtime.   Yes [provider]  oxyCODONE (OXY IR/ROXICODONE) 5 MG immediate release tablet Take 5 mg by mouth every 6 (six) hours as needed. 07/15/21  Yes [provider]  rosuvastatin (CRESTOR) 10 MG tablet Take 1 tablet (10 mg total) by mouth daily. 08/18/20  Yes Dettinger, Fransisca Kaufmann, MD  SYRINGE-NEEDLE, DISP, 3 ML 21G X 1-1/2" 3 ML MISC Use to inject testosterone every week 01/06/20  Yes Nida, Marella Chimes, MD  Testosterone Cypionate 100 MG/ML SOLN Inject 0.5 mLs as directed every 14 (fourteen) days.   Yes [provider]  traZODone (DESYREL) 100 MG tablet Take 1 tablet (100 mg total) by mouth at bedtime as needed for sleep. 07/05/21  Yes Arfeen, Arlyce Harman, MD  Omega-3 Fatty Acids (FISH OIL) 1000 MG CAPS Take 1  capsule by mouth 2 (two) times daily.    [provider]  Risankizumab-rzaa (SKYRIZI PEN) 150 MG/ML SOAJ Inject 150 mg into the skin as directed. Every 12 weeks for maintenance. Patient not taking: Reported on 07/25/2021 03/21/21   Lavonna Monarch, MD  sildenafil (REVATIO) 20 MG tablet TAKE 1 TO 5 TABLETS BY MOUTH AS NEEDED PRIOR TO SEXUAL ACTIVITY 06/15/21   Dettinger, Fransisca Kaufmann, MD  SKYRIZI 150 MG/ML SOSY Inject 1 pen into the skin every 3 (three) months. Patient not taking: Reported on 07/25/2021 03/21/21   Lavonna Monarch, MD    Current Outpatient Medications  Medication Sig Dispense Refill   clomiPRAMINE (ANAFRANIL) 25 MG capsule Take 1 capsule (25 mg total) by mouth 2 (two) times daily. 60 capsule 2   LORazepam (ATIVAN) 0.5 MG tablet Take one tab daily as needed for anxiety and 2nd if needed again 50 tablet 2   Melatonin 10 MG TABS Take 1 tablet by mouth at bedtime.     oxyCODONE (OXY IR/ROXICODONE) 5 MG immediate release tablet Take 5 mg by mouth every 6 (six) hours as needed.     rosuvastatin (CRESTOR) 10 MG tablet Take 1 tablet (10 mg total) by mouth daily. 90 tablet 3   SYRINGE-NEEDLE, DISP, 3 ML 21G X 1-1/2" 3 ML MISC Use to inject testosterone every  week 50 each 1   Testosterone Cypionate 100 MG/ML SOLN Inject 0.5 mLs as directed every 14 (fourteen) days.     traZODone (DESYREL) 100 MG tablet Take 1 tablet (100 mg total) by mouth at bedtime as needed for sleep. 90 tablet 0   Omega-3 Fatty Acids (FISH OIL) 1000 MG CAPS Take 1 capsule by mouth 2 (two) times daily.     Risankizumab-rzaa (SKYRIZI PEN) 150 MG/ML SOAJ Inject 150 mg into the skin as directed. Every 12 weeks for maintenance. (Patient not taking: Reported on 07/25/2021) 1 mL 3   sildenafil (REVATIO) 20 MG tablet TAKE 1 TO 5 TABLETS BY MOUTH AS NEEDED PRIOR TO SEXUAL ACTIVITY 60 tablet 0   SKYRIZI 150 MG/ML SOSY Inject 1 pen into the skin every 3 (three) months. (Patient not taking: Reported on 07/25/2021) 0.56 mL 3    Current Facility-Administered Medications  Medication Dose Route Frequency Provider Last Rate Last Admin   0.9 %  sodium chloride infusion  500 mL Intravenous Once Pervis Macintyre, Carlota Raspberry, MD        Allergies as of 08/17/2021 - Review Complete 08/17/2021  Allergen Reaction Noted   Amoxicillin Other (See Comments) 01/07/2013   Bactrim [sulfamethoxazole-trimethoprim] Other (See Comments) 08/08/2015   Penicillins Other (See Comments) 01/07/2013    Family History  Problem Relation Age of Onset   Kidney cancer Mother        mets from LEFT hip   Liver cancer Mother        mets from LEFT hip   Diverticulitis Mother    Hypertension Father    Diabetes Father    Diabetes Brother    Cancer Paternal Grandmother     Social History   Socioeconomic History   Marital status: Married    Spouse name: Not on file   Number of children: Not on file   Years of education: Not on file   Highest education level: Not on file  Occupational History   Not on file  Tobacco Use   Smoking status: Never   Smokeless tobacco: Never  Vaping Use   Vaping Use: Never used  Substance and Sexual Activity   Alcohol use: Not Currently    Comment: 10/22/2016 "nothing in over 1 year"   Drug use: No   Sexual activity: Yes    Partners: Female    Birth control/protection: None  Other Topics Concern   Not on file  Social History Narrative   Not on file   Social Determinants of Health   Financial Resource Strain: Not on file  Food Insecurity: Not on file  Transportation Needs: Not on file  Physical Activity: Not on file  Stress: Not on file  Social Connections: Not on file  Intimate Partner Violence: Not on file    Review of Systems: All other review of systems negative except as mentioned in the HPI.  Physical Exam: Vital signs BP 123/84   Pulse 95   Temp 97.8 F (36.6 C)   Ht 6\' 2"  (1.88 m)   Wt 272 lb (123.4 kg)   SpO2 98%   BMI 34.92 kg/m   General:   Alert,  Well-developed, pleasant  and cooperative in NAD Lungs:  Clear throughout to auscultation.   Heart:  Regular rate and rhythm Abdomen:  Soft, nontender and nondistended.   Neuro/Psych:  Alert and cooperative. Normal mood and affect. A and O x 3  Jolly Mango, MD [facility]

## 2021-08-17 NOTE — Progress Notes (Signed)
Pt's states no medical or surgical changes since previsit or office visit.   VS taken by DT 

## 2021-08-21 ENCOUNTER — Telehealth: Payer: Self-pay

## 2021-08-21 ENCOUNTER — Encounter: Payer: Self-pay | Admitting: Family Medicine

## 2021-08-21 ENCOUNTER — Telehealth: Payer: Self-pay | Admitting: *Deleted

## 2021-08-21 NOTE — Telephone Encounter (Signed)
  Follow up Call-  Call back number 08/17/2021  Post procedure Call Back phone  # [phone]  Permission to leave phone message Yes  Some recent data might be hidden     Patient questions:   Phone rang and then hung up.

## 2021-08-21 NOTE — Telephone Encounter (Signed)
Attempted to reach pt. With follow-up call following endoscopic procedure 08/17/2021.  No answer at no. Left.  Number differs from contact info, which was the wrong no.  Unable to LM.

## 2021-08-22 ENCOUNTER — Other Ambulatory Visit: Payer: Self-pay

## 2021-08-22 ENCOUNTER — Other Ambulatory Visit: Payer: 59

## 2021-08-22 DIAGNOSIS — N529 Male erectile dysfunction, unspecified: Secondary | ICD-10-CM

## 2021-08-22 DIAGNOSIS — R7303 Prediabetes: Secondary | ICD-10-CM

## 2021-08-22 DIAGNOSIS — I10 Essential (primary) hypertension: Secondary | ICD-10-CM

## 2021-08-22 DIAGNOSIS — Z13220 Encounter for screening for lipoid disorders: Secondary | ICD-10-CM

## 2021-08-22 DIAGNOSIS — E782 Mixed hyperlipidemia: Secondary | ICD-10-CM

## 2021-08-22 DIAGNOSIS — Z125 Encounter for screening for malignant neoplasm of prostate: Secondary | ICD-10-CM

## 2021-08-22 DIAGNOSIS — E781 Pure hyperglyceridemia: Secondary | ICD-10-CM

## 2021-08-22 LAB — BAYER DCA HB A1C WAIVED: HB A1C (BAYER DCA - WAIVED): 5.6 % (ref 4.8–5.6)

## 2021-08-26 LAB — CBC WITH DIFFERENTIAL/PLATELET
Basophils Absolute: 0.1 10*3/uL (ref 0.0–0.2)
Basos: 1 %
EOS (ABSOLUTE): 0.1 10*3/uL (ref 0.0–0.4)
Eos: 2 %
Hematocrit: 55.8 % — ABNORMAL HIGH (ref 37.5–51.0)
Hemoglobin: 18.4 g/dL — ABNORMAL HIGH (ref 13.0–17.7)
Immature Grans (Abs): 0 10*3/uL (ref 0.0–0.1)
Immature Granulocytes: 0 %
Lymphocytes Absolute: 2.2 10*3/uL (ref 0.7–3.1)
Lymphs: 38 %
MCH: 28.3 pg (ref 26.6–33.0)
MCHC: 33 g/dL (ref 31.5–35.7)
MCV: 86 fL (ref 79–97)
Monocytes Absolute: 0.7 10*3/uL (ref 0.1–0.9)
Monocytes: 12 %
Neutrophils Absolute: 2.7 10*3/uL (ref 1.4–7.0)
Neutrophils: 47 %
Platelets: 245 10*3/uL (ref 150–450)
RBC: 6.51 x10E6/uL — ABNORMAL HIGH (ref 4.14–5.80)
RDW: 13.2 % (ref 11.6–15.4)
WBC: 5.8 10*3/uL (ref 3.4–10.8)

## 2021-08-26 LAB — CMP14+EGFR
ALT: 26 IU/L (ref 0–44)
AST: 22 IU/L (ref 0–40)
Albumin/Globulin Ratio: 2.1 (ref 1.2–2.2)
Albumin: 4.6 g/dL (ref 4.0–5.0)
Alkaline Phosphatase: 84 IU/L (ref 44–121)
BUN/Creatinine Ratio: 8 — ABNORMAL LOW (ref 9–20)
BUN: 8 mg/dL (ref 6–24)
Bilirubin Total: 0.4 mg/dL (ref 0.0–1.2)
CO2: 26 mmol/L (ref 20–29)
Calcium: 9.4 mg/dL (ref 8.7–10.2)
Chloride: 100 mmol/L (ref 96–106)
Creatinine, Ser: 1.04 mg/dL (ref 0.76–1.27)
Globulin, Total: 2.2 g/dL (ref 1.5–4.5)
Glucose: 97 mg/dL (ref 70–99)
Potassium: 4.2 mmol/L (ref 3.5–5.2)
Sodium: 138 mmol/L (ref 134–144)
Total Protein: 6.8 g/dL (ref 6.0–8.5)
eGFR: 87 mL/min/{1.73_m2} (ref >59)

## 2021-08-26 LAB — PSA, TOTAL AND FREE
PSA, Free Pct: 26.7 %
PSA, Free: 0.08 ng/mL
Prostate Specific Ag, Serum: 0.3 ng/mL (ref 0.0–4.0)

## 2021-08-26 LAB — LIPID PANEL
Chol/HDL Ratio: 4.2 ratio (ref 0.0–5.0)
Cholesterol, Total: 126 mg/dL (ref 100–199)
HDL: 30 mg/dL — ABNORMAL LOW (ref >39)
LDL Chol Calc (NIH): 65 mg/dL (ref 0–99)
Triglycerides: 186 mg/dL — ABNORMAL HIGH (ref 0–149)
VLDL Cholesterol Cal: 31 mg/dL (ref 5–40)

## 2021-08-26 LAB — TESTOSTERONE,FREE AND TOTAL
Testosterone, Free: 11.7 pg/mL (ref 7.2–24.0)
Testosterone: 533 ng/dL (ref 264–916)

## 2021-08-27 ENCOUNTER — Other Ambulatory Visit (HOSPITAL_COMMUNITY): Payer: Self-pay | Admitting: Psychiatry

## 2021-08-27 DIAGNOSIS — F4010 Social phobia, unspecified: Secondary | ICD-10-CM

## 2021-09-23 ENCOUNTER — Encounter: Payer: Self-pay | Admitting: Family Medicine

## 2021-09-26 ENCOUNTER — Other Ambulatory Visit (HOSPITAL_COMMUNITY): Payer: Self-pay | Admitting: Psychiatry

## 2021-09-26 DIAGNOSIS — F331 Major depressive disorder, recurrent, moderate: Secondary | ICD-10-CM

## 2021-09-28 ENCOUNTER — Encounter: Payer: Self-pay | Admitting: Nurse Practitioner

## 2021-09-28 ENCOUNTER — Encounter: Payer: Self-pay | Admitting: Family Medicine

## 2021-09-28 ENCOUNTER — Ambulatory Visit (INDEPENDENT_AMBULATORY_CARE_PROVIDER_SITE_OTHER): Payer: 59 | Admitting: Nurse Practitioner

## 2021-09-28 VITALS — Temp 102.0°F

## 2021-09-28 DIAGNOSIS — U071 COVID-19: Secondary | ICD-10-CM | POA: Diagnosis not present

## 2021-09-28 MED ORDER — NIRMATRELVIR/RITONAVIR (PAXLOVID)TABLET
3.0000 | ORAL_TABLET | Freq: Two times a day (BID) | ORAL | 0 refills | Status: AC
Start: 2021-09-28 — End: 2021-10-03

## 2021-09-28 NOTE — Assessment & Plan Note (Signed)
Take meds as prescribed - Use a cool mist humidifier  -Use saline nose sprays frequently -Force fluids -For fever or aches or pains- take Tylenol or ibuprofen. -Paxlovid antiviral Rx sent to pharmacy.  Education provided to patient to hold cholesterol medication, sildenafil, and hydrocodone until completing medications in waiting 5 to 7 days before resuming medication.  Patient verbalized understanding  Follow up with worsening unresolved symptoms

## 2021-09-28 NOTE — Progress Notes (Signed)
° °  Virtual Visit  Note Due to COVID-19 pandemic this visit was conducted virtually. This visit type was conducted due to national recommendations for restrictions regarding the COVID-19 Pandemic (e.g. social distancing, sheltering in place) in an effort to limit this patient's exposure and mitigate transmission in our community. All issues noted in this document were discussed and addressed.  A physical exam was not performed with this format.  I connected with Paul Parrish on 09/28/21 at 11:15 am  by telephone and verified that I am speaking with the correct person using two identifiers. Paul Parrish is currently located at home during visit. The provider, Ivy Lynn, NP is located in their office at time of visit.  I discussed the limitations, risks, security and privacy concerns of performing an evaluation and management service by telephone and the availability of in person appointments. I also discussed with the patient that there may be a patient responsible charge related to this service. The patient expressed understanding and agreed to proceed.   History and Present Illness:  HPI  Patient recently exposed to [place] and tested positive for COVID-19 with an at home test.  Symptoms present in the last 24 to 36 hours   Review of Systems  Constitutional:  Positive for chills and fever. Negative for malaise/fatigue.  HENT:  Positive for congestion. Negative for ear discharge, ear pain and sore throat.   Respiratory:  Positive for cough.   Cardiovascular: Negative.   Skin:  Negative for rash.  All other systems reviewed and are negative.   Observations/Objective: Televisit patient not in distress.  Assessment and Plan: Take meds as prescribed - Use a cool mist humidifier  -Use saline nose sprays frequently -Force fluids -For fever or aches or pains- take Tylenol or ibuprofen. -Paxlovid antiviral Rx sent to pharmacy.  Education provided to patient to hold cholesterol medication,  sildenafil, and hydrocodone until completing medications in waiting 5 to 7 days before resuming medication.  Patient verbalized understanding  Follow up with worsening unresolved symptoms   Follow Up Instructions: Follow-up with worsening unresolved symptoms    I discussed the assessment and treatment plan with the patient. The patient was provided an opportunity to ask questions and all were answered. The patient agreed with the plan and demonstrated an understanding of the instructions.   The patient was advised to call back or seek an in-person evaluation if the symptoms worsen or if the condition fails to improve as anticipated.  The above assessment and management plan was discussed with the patient. The patient verbalized understanding of and has agreed to the management plan. Patient is aware to call the clinic if symptoms persist or worsen. Patient is aware when to return to the clinic for a follow-up visit. Patient educated on when it is appropriate to go to the emergency department.   Time call ended: 11:24 AM  I provided 9 minutes of  non face-to-face time during this encounter.    Ivy Lynn, NP

## 2021-10-02 ENCOUNTER — Encounter (HOSPITAL_COMMUNITY): Payer: Self-pay | Admitting: Psychiatry

## 2021-10-02 ENCOUNTER — Other Ambulatory Visit: Payer: Self-pay

## 2021-10-02 ENCOUNTER — Telehealth (HOSPITAL_BASED_OUTPATIENT_CLINIC_OR_DEPARTMENT_OTHER): Payer: 59 | Admitting: Psychiatry

## 2021-10-02 DIAGNOSIS — F410 Panic disorder [episodic paroxysmal anxiety] without agoraphobia: Secondary | ICD-10-CM

## 2021-10-02 DIAGNOSIS — F4010 Social phobia, unspecified: Secondary | ICD-10-CM

## 2021-10-02 DIAGNOSIS — F331 Major depressive disorder, recurrent, moderate: Secondary | ICD-10-CM

## 2021-10-02 MED ORDER — TRAZODONE HCL 100 MG PO TABS: 100.0000 mg | ORAL_TABLET | Freq: Every evening | ORAL | 0 refills | Status: DC | PRN

## 2021-10-02 MED ORDER — LORAZEPAM 0.5 MG PO TABS: ORAL_TABLET | ORAL | 2 refills | Status: DC

## 2021-10-02 MED ORDER — CLOMIPRAMINE HCL 25 MG PO CAPS: 25.0000 mg | ORAL_CAPSULE | Freq: Two times a day (BID) | ORAL | 2 refills | Status: DC

## 2021-10-02 NOTE — Progress Notes (Signed)
Virtual Visit via Telephone Note  I connected with Paul Parrish on 10/02/21 at  3:00 PM EST by telephone and verified that I am speaking with the correct person using two identifiers.  Location: Patient: Home Provider: Home Office   I discussed the limitations, risks, security and privacy concerns of performing an evaluation and management service by telephone and the availability of in person appointments. I also discussed with the patient that there may be a patient responsible charge related to this service. The patient expressed understanding and agreed to proceed.   History of Present Illness: Patient is evaluated by phone session.  He is taking all his medication as prescribed.  He feels his anxiety is under control and he is able to go outside without any nervousness.  Denies any major panic attack since the last visit.  His [name] is quite.  He did not travel because we do not have any family member close by.  He lives with his wife who is very supportive.  Job is going well and is working from home.  He has no tremor or shakes or any EPS.  He sleeps good with the help of melatonin and trazodone.  Lately his back pain is also under control.  His appetite is okay.  Denies any crying spells, feeling of hopelessness or any depressive thoughts.  He like to keep his current medication.  Patient recently had blood work and he has chronic high hemoglobin and hematocrit.  His PSA, for the labs are normal.    Past Psychiatric History:  H/O anxiety, depression and saw psychiatrist at neuropsychiatry in [place] but not happy with the treatment.  Saw Dr. Daron Offer in 2018 and prescribed Prozac, Xanax, Effexor, Celexa, propanolol, Lamictal, Vistaril, Adderall, Wellbutrin,  Cymbalta (increased irritability ), Anafranil and Luvox (did not help). H/O of ECT with no response.  No h/o suicidal attempt or inpatient. At [facility] tried [place], Klonopin but did not work. H/O heavy drinking but claims to  be sober.    Recent Results (from the past 2160 hour(s))  Bayer DCA Hb A1c Waived     Status: None   Collection Time: 08/22/21  8:59 AM  Result Value Ref Range   HB A1C (BAYER DCA - WAIVED) 5.6 4.8 - 5.6 %    Comment:          Prediabetes: 5.7 - 6.4          Diabetes: >6.4          Glycemic control for adults with diabetes: <7.0   Testosterone,Free and Total     Status: None   Collection Time: 08/22/21  9:01 AM  Result Value Ref Range   Testosterone 533 264 - 916 ng/dL    Comment: Adult male reference interval is based on a population of healthy nonobese males (BMI <30) between 77 and 9 years old. [place], [employer] [phone]. PMID: [id number].    Testosterone, Free 11.7 7.2 - 24.0 pg/mL  PSA, total and free     Status: None   Collection Time: 08/22/21  9:01 AM  Result Value Ref Range   Prostate Specific Ag, Serum 0.3 0.0 - 4.0 ng/mL    Comment: Roche ECLIA methodology. According to the American Urological Association, Serum PSA should decrease and remain at undetectable levels after radical prostatectomy. The AUA defines biochemical recurrence as an initial PSA value 0.2 ng/mL or greater followed by a subsequent confirmatory PSA value 0.2 ng/mL or greater. Values obtained with different assay methods or kits  cannot be used interchangeably. Results cannot be interpreted as absolute evidence of the presence or absence of malignant disease.    PSA, Free 0.08 N/A ng/mL    Comment: Roche ECLIA methodology.   PSA, Free Pct 26.7 %    Comment: The table below lists the probability of prostate cancer for men with non-suspicious DRE results and total PSA between 4 and 10 ng/mL, by patient age Ricci Barker, Anna, [id number]).                   % Free PSA       50-64 yr        65-75 yr                   0.00-10.00%        56%             55%                  10.01-15.00%        24%             35%                  15.01-20.00%        17%             23%                   20.01-25.00%        10%             20%                       >25.00%         5%              9% Please note:  Catalona et al did not make specific               recommendations regarding the use of               percent free PSA for any other population               of men.   Lipid panel     Status: Abnormal   Collection Time: 08/22/21  9:01 AM  Result Value Ref Range   Cholesterol, Total 126 100 - 199 mg/dL   Triglycerides 186 (H) 0 - 149 mg/dL   HDL 30 (L) >39 mg/dL   VLDL Cholesterol Cal 31 5 - 40 mg/dL   LDL Chol Calc (NIH) 65 0 - 99 mg/dL   Chol/HDL Ratio 4.2 0.0 - 5.0 ratio    Comment:                                   T. Chol/HDL Ratio                                             Men  Women                               1/2 Avg.Risk  3.4    3.3  Avg.Risk  5.0    4.4                                2X Avg.Risk  9.6    7.1                                3X Avg.Risk 23.4   11.0   CMP14+EGFR     Status: Abnormal   Collection Time: 08/22/21  9:01 AM  Result Value Ref Range   Glucose 97 70 - 99 mg/dL   BUN 8 6 - 24 mg/dL   Creatinine, Ser 1.04 0.76 - 1.27 mg/dL   eGFR 87 >59 mL/min/1.73   BUN/Creatinine Ratio 8 (L) 9 - 20   Sodium 138 134 - 144 mmol/L   Potassium 4.2 3.5 - 5.2 mmol/L   Chloride 100 96 - 106 mmol/L   CO2 26 20 - 29 mmol/L   Calcium 9.4 8.7 - 10.2 mg/dL   Total Protein 6.8 6.0 - 8.5 g/dL   Albumin 4.6 4.0 - 5.0 g/dL   Globulin, Total 2.2 1.5 - 4.5 g/dL   Albumin/Globulin Ratio 2.1 1.2 - 2.2   Bilirubin Total 0.4 0.0 - 1.2 mg/dL   Alkaline Phosphatase 84 44 - 121 IU/L   AST 22 0 - 40 IU/L   ALT 26 0 - 44 IU/L  CBC with Differential/Platelet     Status: Abnormal   Collection Time: 08/22/21  9:01 AM  Result Value Ref Range   WBC 5.8 3.4 - 10.8 x10E3/uL   RBC 6.51 (H) 4.14 - 5.80 x10E6/uL   Hemoglobin 18.4 (H) 13.0 - 17.7 g/dL   Hematocrit 55.8 (H) 37.5 - 51.0 %   MCV 86 79 - 97 fL   MCH 28.3 26.6 - 33.0 pg   MCHC  33.0 31.5 - 35.7 g/dL   RDW 13.2 11.6 - 15.4 %   Platelets 245 150 - 450 x10E3/uL   Neutrophils 47 Not Estab. %   Lymphs 38 Not Estab. %   Monocytes 12 Not Estab. %   Eos 2 Not Estab. %   Basos 1 Not Estab. %   Neutrophils Absolute 2.7 1.4 - 7.0 x10E3/uL   Lymphocytes Absolute 2.2 0.7 - 3.1 x10E3/uL   Monocytes Absolute 0.7 0.1 - 0.9 x10E3/uL   EOS (ABSOLUTE) 0.1 0.0 - 0.4 x10E3/uL   Basophils Absolute 0.1 0.0 - 0.2 x10E3/uL   Immature Granulocytes 0 Not Estab. %   Immature Grans (Abs) 0.0 0.0 - 0.1 x10E3/uL     Psychiatric Specialty Exam: Physical Exam  Review of Systems  Weight 270 lb (122.5 kg).There is no height or weight on file to calculate BMI.  General Appearance: NA  Eye Contact:  NA  Speech:  Normal Rate  Volume:  Normal  Mood:  Euthymic  Affect:  NA  Thought Process:  Goal Directed  Orientation:  Full (Time, Place, and Person)  Thought Content:  Logical  Suicidal Thoughts:  No  Homicidal Thoughts:  No  Memory:  Immediate;   Good Recent;   Good Remote;   Good  Judgement:  Intact  Insight:  Present  Psychomotor Activity:  NA  Concentration:  Concentration: Good and Attention Span: Good  Recall:  Good  Fund of Knowledge:  Good  Language:  Good  Akathisia:  No  Handed:  Right  AIMS (if indicated):  Assets:  Communication Skills Desire for Improvement Housing Resilience  ADL's:  Intact  Cognition:  WNL  Sleep:   ok      Assessment and Plan: Major depressive disorder, recurrent.  Social anxiety disorder.  Panic attacks with  Patient is stable on his current medication.  He like extra Ativan which he takes when he feels very nervous.  I reviewed his blood work results.  Discussed medication side effects and benefits.  Continue Anafranil 25 mg twice a day, Ativan 0.5 mg daily and if needed he can take the second pill and continue trazodone 100 mg at bedtime.  Recommended to call us back if is any question or any concern.  Follow-up in 3  months.  Follow Up Instructions:    I discussed the assessment and treatment plan with the patient. The patient was provided an opportunity to ask questions and all were answered. The patient agreed with the plan and demonstrated an understanding of the instructions.   The patient was advised to call back or seek an in-person evaluation if the symptoms worsen or if the condition fails to improve as anticipated.  I provided 23 minutes of non-face-to-face time during this encounter.   Kathlee Nations, MD

## 2021-10-08 ENCOUNTER — Other Ambulatory Visit: Payer: 59

## 2021-10-11 ENCOUNTER — Other Ambulatory Visit: Payer: Self-pay

## 2021-10-11 ENCOUNTER — Ambulatory Visit: Payer: 59 | Admitting: "Endocrinology

## 2021-10-11 ENCOUNTER — Encounter: Payer: Self-pay | Admitting: "Endocrinology

## 2021-10-11 VITALS — BP 122/84 | HR 96 | Ht 74.0 in | Wt 279.8 lb

## 2021-10-11 DIAGNOSIS — Z6835 Body mass index (BMI) 35.0-35.9, adult: Secondary | ICD-10-CM

## 2021-10-11 DIAGNOSIS — E291 Testicular hypofunction: Secondary | ICD-10-CM | POA: Diagnosis not present

## 2021-10-11 LAB — CBC WITH DIFFERENTIAL/PLATELET
Basophils Absolute: 0.1 10*3/uL (ref 0.0–0.2)
Basos: 1 %
EOS (ABSOLUTE): 0.1 10*3/uL (ref 0.0–0.4)
Eos: 2 %
Hematocrit: 51.4 % — ABNORMAL HIGH (ref 37.5–51.0)
Hemoglobin: 18.2 g/dL — ABNORMAL HIGH (ref 13.0–17.7)
Immature Grans (Abs): 0 10*3/uL (ref 0.0–0.1)
Immature Granulocytes: 0 %
Lymphocytes Absolute: 2.9 10*3/uL (ref 0.7–3.1)
Lymphs: 44 %
MCH: 30 pg (ref 26.6–33.0)
MCHC: 35.4 g/dL (ref 31.5–35.7)
MCV: 85 fL (ref 79–97)
Monocytes Absolute: 0.7 10*3/uL (ref 0.1–0.9)
Monocytes: 10 %
Neutrophils Absolute: 2.9 10*3/uL (ref 1.4–7.0)
Neutrophils: 43 %
Platelets: 250 10*3/uL (ref 150–450)
RBC: 6.06 x10E6/uL — ABNORMAL HIGH (ref 4.14–5.80)
RDW: 14.7 % (ref 11.6–15.4)
WBC: 6.6 10*3/uL (ref 3.4–10.8)

## 2021-10-11 LAB — COMPREHENSIVE METABOLIC PANEL
ALT: 27 IU/L (ref 0–44)
AST: 19 IU/L (ref 0–40)
Albumin/Globulin Ratio: 1.9 (ref 1.2–2.2)
Albumin: 4.8 g/dL (ref 4.0–5.0)
Alkaline Phosphatase: 89 IU/L (ref 44–121)
BUN/Creatinine Ratio: 16 (ref 9–20)
BUN: 16 mg/dL (ref 6–24)
Bilirubin Total: 0.7 mg/dL (ref 0.0–1.2)
CO2: 23 mmol/L (ref 20–29)
Calcium: 9.4 mg/dL (ref 8.7–10.2)
Chloride: 100 mmol/L (ref 96–106)
Creatinine, Ser: 1.01 mg/dL (ref 0.76–1.27)
Globulin, Total: 2.5 g/dL (ref 1.5–4.5)
Glucose: 96 mg/dL (ref 70–99)
Potassium: 4.1 mmol/L (ref 3.5–5.2)
Sodium: 140 mmol/L (ref 134–144)
Total Protein: 7.3 g/dL (ref 6.0–8.5)
eGFR: 91 mL/min/{1.73_m2} (ref >59)

## 2021-10-11 LAB — TESTOSTERONE, FREE, TOTAL, SHBG
Sex Hormone Binding: 21.9 nmol/L (ref 19.3–76.4)
Testosterone, Free: 3.6 pg/mL — ABNORMAL LOW (ref 7.2–24.0)
Testosterone: 160 ng/dL — ABNORMAL LOW (ref 264–916)

## 2021-10-11 LAB — PSA: Prostate Specific Ag, Serum: 0.3 ng/mL (ref 0.0–4.0)

## 2021-10-11 NOTE — Progress Notes (Signed)
10/11/2021      Endocrinology follow-up note   HPI: Paul Parrish is a 51 y.o.-year-old man.  - He is being seen in follow-up for  hypogonadism.  He was diagnosed with hypogonadism in 2014, etiology unclear.   -Due to polycythemia, he undergoes phlebotomy periodically.  During his last visit, he was advised to lower his testosterone to 50 mg every 14 days.  That adjustment lowered his hematocrit, hemoglobin, however also dropped his total testosterone to 160 from 329.     His previsit labs show improving polycythemia.  -He reports history of seizures as a young man until his teenage years. -He denies any history of head injury.  - He also is recently diagnosed with sleep apnea currently on CPAP.  - He fathers 2 teenage boys biologically. - He has significant mood disorder on multiple antidepressants and antianxiety. No herbal medicines. -He denies family history of premature coronary artery disease.   ROS: Limited as above.  PE: BP 122/84    Pulse 96    Ht 6' 2"  (1.88 m)    Wt 279 lb 12.8 oz (126.9 kg)    BMI 35.92 kg/m  Wt Readings from Last 3 Encounters:  10/11/21 279 lb 12.8 oz (126.9 kg)  08/17/21 269 lb (122 kg)  08/17/21 272 lb (123.4 kg)     Recent Results (from the past 2160 hour(s))  Bayer DCA Hb A1c Waived     Status: None   Collection Time: 08/22/21  8:59 AM  Result Value Ref Range   HB A1C (BAYER DCA - WAIVED) 5.6 4.8 - 5.6 %    Comment:          Prediabetes: 5.7 - 6.4          Diabetes: >6.4          Glycemic control for adults with diabetes: <7.0   Testosterone,Free and Total     Status: None   Collection Time: 08/22/21  9:01 AM  Result Value Ref Range   Testosterone 533 264 - 916 ng/dL    Comment: Adult male reference interval is based on a population of healthy nonobese males (BMI <30) between 70 and 70 years old. [place], [place] [phone]. PMID: [id number].    Testosterone, Free 11.7 7.2 - 24.0 pg/mL  PSA, total and free     Status:  None   Collection Time: 08/22/21  9:01 AM  Result Value Ref Range   Prostate Specific Ag, Serum 0.3 0.0 - 4.0 ng/mL    Comment: Roche ECLIA methodology. According to the American Urological Association, Serum PSA should decrease and remain at undetectable levels after radical prostatectomy. The AUA defines biochemical recurrence as an initial PSA value 0.2 ng/mL or greater followed by a subsequent confirmatory PSA value 0.2 ng/mL or greater. Values obtained with different assay methods or kits cannot be used interchangeably. Results cannot be interpreted as absolute evidence of the presence or absence of malignant disease.    PSA, Free 0.08 N/A ng/mL    Comment: Roche ECLIA methodology.   PSA, Free Pct 26.7 %    Comment: The table below lists the probability of prostate cancer for men with non-suspicious DRE results and total PSA between 4 and 10 ng/mL, by patient age Ricci Barker, [place], [id number]).                   % Free PSA       50-64 yr        47-75 yr  0.00-10.00%        56%             55%                  10.01-15.00%        24%             35%                  15.01-20.00%        17%             23%                  20.01-25.00%        10%             20%                       >25.00%         5%              9% Please note:  Catalona et al did not make specific               recommendations regarding the use of               percent free PSA for any other population               of men.   Lipid panel     Status: Abnormal   Collection Time: 08/22/21  9:01 AM  Result Value Ref Range   Cholesterol, Total 126 100 - 199 mg/dL   Triglycerides 186 (H) 0 - 149 mg/dL   HDL 30 (L) >39 mg/dL   VLDL Cholesterol Cal 31 5 - 40 mg/dL   LDL Chol Calc (NIH) 65 0 - 99 mg/dL   Chol/HDL Ratio 4.2 0.0 - 5.0 ratio    Comment:                                   T. Chol/HDL Ratio                                             Men  Women                                1/2 Avg.Risk  3.4    3.3                                   Avg.Risk  5.0    4.4                                2X Avg.Risk  9.6    7.1                                3X Avg.Risk 23.4   11.0   CMP14+EGFR     Status: Abnormal   Collection Time: 08/22/21  9:01 AM  Result Value Ref Range   Glucose 97 70 -  99 mg/dL   BUN 8 6 - 24 mg/dL   Creatinine, Ser 1.04 0.76 - 1.27 mg/dL   eGFR 87 >59 mL/min/1.73   BUN/Creatinine Ratio 8 (L) 9 - 20   Sodium 138 134 - 144 mmol/L   Potassium 4.2 3.5 - 5.2 mmol/L   Chloride 100 96 - 106 mmol/L   CO2 26 20 - 29 mmol/L   Calcium 9.4 8.7 - 10.2 mg/dL   Total Protein 6.8 6.0 - 8.5 g/dL   Albumin 4.6 4.0 - 5.0 g/dL   Globulin, Total 2.2 1.5 - 4.5 g/dL   Albumin/Globulin Ratio 2.1 1.2 - 2.2   Bilirubin Total 0.4 0.0 - 1.2 mg/dL   Alkaline Phosphatase 84 44 - 121 IU/L   AST 22 0 - 40 IU/L   ALT 26 0 - 44 IU/L  CBC with Differential/Platelet     Status: Abnormal   Collection Time: 08/22/21  9:01 AM  Result Value Ref Range   WBC 5.8 3.4 - 10.8 x10E3/uL   RBC 6.51 (H) 4.14 - 5.80 x10E6/uL   Hemoglobin 18.4 (H) 13.0 - 17.7 g/dL   Hematocrit 55.8 (H) 37.5 - 51.0 %   MCV 86 79 - 97 fL   MCH 28.3 26.6 - 33.0 pg   MCHC 33.0 31.5 - 35.7 g/dL   RDW 13.2 11.6 - 15.4 %   Platelets 245 150 - 450 x10E3/uL   Neutrophils 47 Not Estab. %   Lymphs 38 Not Estab. %   Monocytes 12 Not Estab. %   Eos 2 Not Estab. %   Basos 1 Not Estab. %   Neutrophils Absolute 2.7 1.4 - 7.0 x10E3/uL   Lymphocytes Absolute 2.2 0.7 - 3.1 x10E3/uL   Monocytes Absolute 0.7 0.1 - 0.9 x10E3/uL   EOS (ABSOLUTE) 0.1 0.0 - 0.4 x10E3/uL   Basophils Absolute 0.1 0.0 - 0.2 x10E3/uL   Immature Granulocytes 0 Not Estab. %   Immature Grans (Abs) 0.0 0.0 - 0.1 x10E3/uL  Testosterone, Free, Total, SHBG     Status: Abnormal   Collection Time: 10/08/21  9:18 AM  Result Value Ref Range   Testosterone 160 (L) 264 - 916 ng/dL    Comment: Adult male reference interval is based on a population  of healthy nonobese males (BMI <30) between 2 and 83 years old. [place], [place] [phone]. PMID: [id number].    Testosterone, Free 3.6 (L) 7.2 - 24.0 pg/mL   Sex Hormone Binding 21.9 19.3 - 76.4 nmol/L  PSA     Status: None   Collection Time: 10/08/21  9:18 AM  Result Value Ref Range   Prostate Specific Ag, Serum 0.3 0.0 - 4.0 ng/mL    Comment: Roche ECLIA methodology. According to the American Urological Association, Serum PSA should decrease and remain at undetectable levels after radical prostatectomy. The AUA defines biochemical recurrence as an initial PSA value 0.2 ng/mL or greater followed by a subsequent confirmatory PSA value 0.2 ng/mL or greater. Values obtained with different assay methods or kits cannot be used interchangeably. Results cannot be interpreted as absolute evidence of the presence or absence of malignant disease.   CBC with Differential/Platelet     Status: Abnormal   Collection Time: 10/08/21  9:18 AM  Result Value Ref Range   WBC 6.6 3.4 - 10.8 x10E3/uL   RBC 6.06 (H) 4.14 - 5.80 x10E6/uL   Hemoglobin 18.2 (H) 13.0 - 17.7 g/dL   Hematocrit 51.4 (H) 37.5 - 51.0 %   MCV 85 79 - 97 fL  MCH 30.0 26.6 - 33.0 pg   MCHC 35.4 31.5 - 35.7 g/dL   RDW 14.7 11.6 - 15.4 %   Platelets 250 150 - 450 x10E3/uL   Neutrophils 43 Not Estab. %   Lymphs 44 Not Estab. %   Monocytes 10 Not Estab. %   Eos 2 Not Estab. %   Basos 1 Not Estab. %   Neutrophils Absolute 2.9 1.4 - 7.0 x10E3/uL   Lymphocytes Absolute 2.9 0.7 - 3.1 x10E3/uL   Monocytes Absolute 0.7 0.1 - 0.9 x10E3/uL   EOS (ABSOLUTE) 0.1 0.0 - 0.4 x10E3/uL   Basophils Absolute 0.1 0.0 - 0.2 x10E3/uL   Immature Granulocytes 0 Not Estab. %   Immature Grans (Abs) 0.0 0.0 - 0.1 x10E3/uL  Comprehensive metabolic panel     Status: None   Collection Time: 10/08/21  9:18 AM  Result Value Ref Range   Glucose 96 70 - 99 mg/dL   BUN 16 6 - 24 mg/dL   Creatinine, Ser 1.01 0.76 - 1.27 mg/dL   eGFR 91 >59  mL/min/1.73   BUN/Creatinine Ratio 16 9 - 20   Sodium 140 134 - 144 mmol/L   Potassium 4.1 3.5 - 5.2 mmol/L   Chloride 100 96 - 106 mmol/L   CO2 23 20 - 29 mmol/L   Calcium 9.4 8.7 - 10.2 mg/dL   Total Protein 7.3 6.0 - 8.5 g/dL   Albumin 4.8 4.0 - 5.0 g/dL   Globulin, Total 2.5 1.5 - 4.5 g/dL   Albumin/Globulin Ratio 1.9 1.2 - 2.2   Bilirubin Total 0.7 0.0 - 1.2 mg/dL   Alkaline Phosphatase 89 44 - 121 IU/L   AST 19 0 - 40 IU/L   ALT 27 0 - 44 IU/L      ASSESSMENT: 1.  Hypogonadism  2. Erectile Dysfunction 3.  Polycythemia 4.  Prediabetes  PLAN:  1. Hypogonadism  -He did not perform any phlebotomy since last visit.  His recent labs show improving polycythemia on lower dose of testosterone.  He still has higher than normal hematocrit and hemoglobin.  His total testosterone is 160 dropping from 329.    At this time, he is advised to discontinue testosterone until next measurement.      He wishes to be reconsidered for testosterone supplement on a later date.     His history of  secondary polycythemia which continues to require periodic phlebotomy, as well as sleep apnea requiring CPAP treatment making him a poor candidate for more generous testosterone replacement. Regarding his weight concern: - he acknowledges that there is a room for improvement in his food and drink choices. - Suggestion is made for him to avoid simple carbohydrates  from his diet including Cakes, Sweet Desserts, Ice Cream, Soda (diet and regular), Sweet Tea, Candies, Chips, Cookies, Store Bought Juices, Alcohol , Artificial Sweeteners,  Coffee Creamer, and "Sugar-free" Products, Lemonade. This will help patient to have more stable blood glucose profile and potentially avoid unintended weight gain.  The following Lifestyle Medicine recommendations according to [place]  [facility]) were discussed and and offered to patient and he  agrees to start the journey:  A. Whole Foods,  Plant-Based Nutrition comprising of fruits and vegetables, plant-based proteins, whole-grain carbohydrates was discussed in detail with the patient.   A list for source of those nutrients were also provided to the patient.  Patient will use only water or unsweetened tea for hydration. B.  The need to stay away from risky substances  including alcohol, smoking; obtaining 7 to 9 hours of restorative sleep, at least 150 minutes of moderate intensity exercise weekly, the importance of healthy social connections,  and stress management techniques were discussed. C.  A full color page of  Calorie density of various food groups per pound showing examples of each food groups was provided to the patient.    He is advised to continue close follow-up with his PMD for primary care needs.   I spent 30 minutes in the care of the patient today including review of labs from Thyroid Function, CMP, and other relevant labs ; imaging/biopsy records (current and previous including abstractions from other facilities); face-to-face time discussing  his lab results and symptoms, medications doses, his options of short and long term treatment based on the latest standards of care / guidelines;   and documenting the encounter.  [name]  participated in the discussions, expressed understanding, and voiced agreement with the above plans.  All questions were answered to his satisfaction. he is encouraged to contact clinic should he have any questions or concerns prior to his return visit.  Glade Lloyd, MD Phone: [phone]  Fax: [phone]  -  This note was partially dictated with voice recognition software. Similar sounding words can be transcribed inadequately or may not  be corrected upon review.  10/11/2021, 4:42 PM

## 2021-10-11 NOTE — Patient Instructions (Signed)

## 2021-10-13 ENCOUNTER — Other Ambulatory Visit: Payer: Self-pay | Admitting: Cardiovascular Disease

## 2021-10-13 MED ORDER — METOPROLOL TARTRATE 25 MG PO TABS: 25.0000 mg | ORAL_TABLET | Freq: Two times a day (BID) | ORAL | 11 refills | Status: DC

## 2021-10-15 ENCOUNTER — Telehealth: Payer: Self-pay | Admitting: *Deleted

## 2021-10-15 ENCOUNTER — Encounter: Payer: Self-pay | Admitting: Cardiovascular Disease

## 2021-10-15 ENCOUNTER — Ambulatory Visit: Payer: 59 | Admitting: Cardiovascular Disease

## 2021-10-15 ENCOUNTER — Other Ambulatory Visit: Payer: Self-pay

## 2021-10-15 VITALS — BP 124/68 | HR 74 | Ht 74.0 in | Wt 282.2 lb

## 2021-10-15 DIAGNOSIS — I479 Paroxysmal tachycardia, unspecified: Secondary | ICD-10-CM

## 2021-10-15 DIAGNOSIS — R000 Tachycardia, unspecified: Secondary | ICD-10-CM

## 2021-10-15 NOTE — Patient Instructions (Signed)
Medication Instructions:  Your physician recommends that you continue on your current medications as directed. Please refer to the Current Medication list given to you today.  *If you need a refill on your cardiac medications before your next appointment, please call your pharmacy*   Lab Work: Today: TSH, BMET & CBC   Testing/Procedures: None ordered   Follow-Up: At [facility], you and your health needs are our priority.  As part of our continuing mission to provide you with exceptional heart care, we have created designated Provider Care Teams.  These Care Teams include your primary Cardiologist (physician) and Advanced Practice Providers (APPs -  Physician Assistants and Nurse Practitioners) who all work together to provide you with the care you need, when you need it.  Your next appointment:   3 month(s)  The format for your next appointment:   In Person  Provider:   Dr. Acie Fredrickson    Thank you for choosing [facility]!!   [phone]   Other Instructions

## 2021-10-15 NOTE — Telephone Encounter (Signed)
Nahser, Paul Cheng, MD  P Cv [facility] Triage Please schedule an appt for Paul Parrish ( Linda's husband) for tachycardia .  We can see him at 4:30 on Monday or on Jan. 24 at 11:40 or 1:40     Spoke with the pt and was able to schedule him to see Dr. Acie Fredrickson today in clinic at 4:30 pm, as a new pt.  Dr. Acie Fredrickson will be seeing him for complaints of tachycardia.  Pt aware of appt date and time.  He is aware to arrive 15 mins prior to this appt.  Pt verbalized understanding and agrees with this plan.

## 2021-10-15 NOTE — Progress Notes (Addendum)
Cardiology Office Note:    Date:  10/15/2021   ID:  [name], DOB [birth year], MRN [id number]  PCP:  Dettinger, Fransisca Kaufmann, MD   [facility] Providers Cardiologist:  None     Referring MD: Dettinger, Fransisca Kaufmann, MD   Chief Complaint  Patient presents with   Tachycardia           Jan. 16, 2023    Paul Parrish is a 51 y.o. male with a hx of anxiety, depression , anemia , OSA,  [facility]  The last several times he has attempted to give , [facility] refused to [facility] to give blood  Has had fatigue , lack of energy ,   Naps frequently  Has OSA - his CPAP mask is working properly ,  Wakes up feeling well .    Has been present for the past several months ,   Has the impression that his HR has been fast   No weight loss,  no sweats, no diarrhea   Works at home ( [employer] )   We started metoprolol on Sat PM - he seems to be feeling better  HR is better.  We discussed getting an echo . Given the cost, we will hold off for now and see if he feels better after being on the meds for several months     Past Medical History:  Diagnosis Date   Anxiety    on meds   Depression    on meds   Elevated hemoglobin ([place])    Low testosterone    Sleep apnea    uses CPAP   Small bowel obstruction ([place]) 10/21/2016    Past Surgical History:  Procedure Laterality Date   COLONOSCOPY  08/17/2021   LUMBAR SPINE SURGERY  12/2020   NASAL SINUS SURGERY  02/2016   WISDOM TOOTH EXTRACTION      Current Medications: Current Meds  Medication Sig   clomiPRAMINE (ANAFRANIL) 25 MG capsule Take 1 capsule (25 mg total) by mouth 2 (two) times daily.   LORazepam (ATIVAN) 0.5 MG tablet Take one tab daily as needed for anxiety and 2nd if needed again   Melatonin 10 MG TABS Take 1 tablet by mouth at bedtime.   metoprolol tartrate (LOPRESSOR) 25 MG tablet Take 1 tablet (25 mg total) by mouth 2 (two) times daily.   Multiple Vitamin  (MULTI-VITAMIN DAILY PO) Take 1 capsule by mouth 2 (two) times daily. Name: Life Extensions   Omega-3 Fatty Acids (FISH OIL) 1000 MG CAPS Take 1 capsule by mouth 2 (two) times daily.   oxyCODONE (OXY IR/ROXICODONE) 5 MG immediate release tablet Take 5 mg by mouth every 8 (eight) hours as needed.   Risankizumab-rzaa (SKYRIZI PEN) 150 MG/ML SOAJ Inject 150 mg into the skin as directed. Every 12 weeks for maintenance.   rosuvastatin (CRESTOR) 10 MG tablet Take 1 tablet (10 mg total) by mouth daily.   sildenafil (REVATIO) 20 MG tablet Take 20 mg by mouth daily.   SKYRIZI 150 MG/ML SOSY Inject 1 pen into the skin every 3 (three) months.   SYRINGE-NEEDLE, DISP, 3 ML 21G X 1-1/2" 3 ML MISC Use to inject testosterone every week   traZODone (DESYREL) 100 MG tablet Take 1 tablet (100 mg total) by mouth at bedtime as needed for sleep.     Allergies:   Amoxicillin, Bactrim [sulfamethoxazole-trimethoprim], and Penicillins   Social History   Socioeconomic History   Marital  status: Married    Spouse name: Not on file   Number of children: Not on file   Years of education: Not on file   Highest education level: Not on file  Occupational History   Not on file  Tobacco Use   Smoking status: Never   Smokeless tobacco: Never  Vaping Use   Vaping Use: Never used  Substance and Sexual Activity   Alcohol use: Not Currently    Comment: 10/22/2016 "nothing in over 1 year"   Drug use: No   Sexual activity: Yes    Partners: Female    Birth control/protection: None  Other Topics Concern   Not on file  Social History Narrative   Not on file   Social Determinants of Health   Financial Resource Strain: Not on file  Food Insecurity: Not on file  Transportation Needs: Not on file  Physical Activity: Not on file  Stress: Not on file  Social Connections: Not on file     Family History: The patient's family history includes Cancer in his paternal grandmother; Diabetes in his brother and father;  Diverticulitis in his mother; Hypertension in his father; Kidney cancer in his mother; Liver cancer in his mother.  ROS:   Please see the history of present illness.     All other systems reviewed and are negative.  EKGs/Labs/Other Studies Reviewed:    The following studies were reviewed today:   EKG:   October 15, 2021: Normal sinus rhythm at 75.  Normal EKG.  Recent Labs: 10/08/2021: ALT 27; BUN 16; Creatinine, Ser 1.01; Hemoglobin 18.2; Platelets 250; Potassium 4.1; Sodium 140  Recent Lipid Panel    Component Value Date/Time   CHOL 126 08/22/2021 0901   TRIG 186 (H) 08/22/2021 0901   HDL 30 (L) 08/22/2021 0901   CHOLHDL 4.2 08/22/2021 0901   LDLCALC 65 08/22/2021 0901     Risk Assessment/Calculations:           Physical Exam:    VS:  BP 124/68    Pulse 74    Ht 6\' 2"  (1.88 m)    Wt 282 lb 3.2 oz (128 kg)    SpO2 97%    BMI 36.23 kg/m     Wt Readings from Last 3 Encounters:  10/15/21 282 lb 3.2 oz (128 kg)  10/11/21 279 lb 12.8 oz (126.9 kg)  08/17/21 269 lb (122 kg)     GEN:  Well nourished, well developed in no acute distress HEENT: Normal NECK: No JVD; No carotid bruits LYMPHATICS: No lymphadenopathy CARDIAC: RRR, no murmurs, rubs, gallops RESPIRATORY:  Clear to auscultation without rales, wheezing or rhonchi  ABDOMEN: Soft, non-tender, non-distended MUSCULOSKELETAL:  No edema; No deformity  SKIN: Warm and dry NEUROLOGIC:  Alert and oriented x 3 PSYCHIATRIC:  Normal affect   ASSESSMENT:    1. Paroxysmal tachycardia, unspecified ([place])   2. Sinus tachycardia    PLAN:       Sinus tachycardia: Wallis presents for further evaluation of sinus tachycardia.  He has had sinus tachycardia, fatigue, generalized lack of energy for the past several months.  He is not had any unexplained weight loss.  There is no diaphoresis.  He states that he has been sleeping well. Will check CBC, basic metabolic profile, TSH. Its possible that this is due to low testosterone  levels  For now he should continue the metoprolol 25 BID . Will see him back in 3 months    2.  Obesity:   he would  like to consider trying Ozympic or Wegovy for weight loss.   He has pre-diabetes - HbA1C was 6.0 on 02/18/21 Will send a message to our clinical pharmacologists.              Medication Adjustments/Labs and Tests Ordered: Current medicines are reviewed at length with the patient today.  Concerns regarding medicines are outlined above.  Orders Placed This Encounter  Procedures   TSH   Basic metabolic panel   CBC   EKG 12-Lead   No orders of the defined types were placed in this encounter.   Patient Instructions  Medication Instructions:  Your physician recommends that you continue on your current medications as directed. Please refer to the Current Medication list given to you today.  *If you need a refill on your cardiac medications before your next appointment, please call your pharmacy*   Lab Work: Today: TSH, BMET & CBC   Testing/Procedures: None ordered   Follow-Up: At [facility], you and your health needs are our priority.  As part of our continuing mission to provide you with exceptional heart care, we have created designated Provider Care Teams.  These Care Teams include your primary Cardiologist (physician) and Advanced Practice Providers (APPs -  Physician Assistants and Nurse Practitioners) who all work together to provide you with the care you need, when you need it.  Your next appointment:   3 month(s)  The format for your next appointment:   In Person  Provider:   Dr. Acie Fredrickson    Thank you for choosing [facility]!!   [phone]   Other Instructions       Signed, Mertie Moores, MD  10/15/2021 5:18 PM    [facility]

## 2021-10-16 LAB — CBC
Hematocrit: 52.5 % — ABNORMAL HIGH (ref 37.5–51.0)
Hemoglobin: 18 g/dL — ABNORMAL HIGH (ref 13.0–17.7)
MCH: 29.9 pg (ref 26.6–33.0)
MCHC: 34.3 g/dL (ref 31.5–35.7)
MCV: 87 fL (ref 79–97)
Platelets: 284 10*3/uL (ref 150–450)
RBC: 6.02 x10E6/uL — ABNORMAL HIGH (ref 4.14–5.80)
RDW: 14.9 % (ref 11.6–15.4)
WBC: 7 10*3/uL (ref 3.4–10.8)

## 2021-10-16 LAB — BASIC METABOLIC PANEL
BUN/Creatinine Ratio: 11 (ref 9–20)
BUN: 12 mg/dL (ref 6–24)
CO2: 25 mmol/L (ref 20–29)
Calcium: 9 mg/dL (ref 8.7–10.2)
Chloride: 99 mmol/L (ref 96–106)
Creatinine, Ser: 1.09 mg/dL (ref 0.76–1.27)
Glucose: 102 mg/dL — ABNORMAL HIGH (ref 70–99)
Potassium: 4.4 mmol/L (ref 3.5–5.2)
Sodium: 137 mmol/L (ref 134–144)
eGFR: 83 mL/min/{1.73_m2} (ref >59)

## 2021-10-16 LAB — TSH: TSH: 2.62 u[IU]/mL (ref 0.450–4.500)

## 2021-10-17 ENCOUNTER — Telehealth: Payer: Self-pay | Admitting: Pharmacist

## 2021-10-17 NOTE — Telephone Encounter (Signed)
Received referral from Dr Acie Fredrickson to initiate APO1ID therapy for weight loss. Pt also prediabetic with A1c of 6. Prior authorization for [facility] submitted, will follow up with pt once determination is made.

## 2021-10-18 NOTE — Telephone Encounter (Signed)
[facility] prior authorization was denied, weight loss meds are excluded from coverage by his insurance.  Will try submitting prior authorization for Ozempic.

## 2021-10-18 NOTE — Telephone Encounter (Signed)
Ozempic prior authorization denied since pt does not have diagnosis of diabetes. Discussed with pt. Advised him to let us know if he gets new insurance in the future and can try to resubmit for coverage.

## 2021-11-07 ENCOUNTER — Encounter: Payer: Self-pay | Admitting: Dermatology

## 2021-11-07 ENCOUNTER — Other Ambulatory Visit: Payer: Self-pay

## 2021-11-07 ENCOUNTER — Ambulatory Visit: Payer: 59 | Admitting: Dermatology

## 2021-11-07 DIAGNOSIS — L409 Psoriasis, unspecified: Secondary | ICD-10-CM

## 2021-11-07 DIAGNOSIS — Z1283 Encounter for screening for malignant neoplasm of skin: Secondary | ICD-10-CM | POA: Diagnosis not present

## 2021-11-07 DIAGNOSIS — B079 Viral wart, unspecified: Secondary | ICD-10-CM | POA: Diagnosis not present

## 2021-11-07 DIAGNOSIS — R5383 Other fatigue: Secondary | ICD-10-CM | POA: Diagnosis not present

## 2021-11-07 DIAGNOSIS — L308 Other specified dermatitis: Secondary | ICD-10-CM

## 2021-11-07 DIAGNOSIS — D485 Neoplasm of uncertain behavior of skin: Secondary | ICD-10-CM

## 2021-11-07 DIAGNOSIS — L858 Other specified epidermal thickening: Secondary | ICD-10-CM

## 2021-11-07 MED ORDER — CLOBETASOL PROPIONATE 0.05 % EX OINT: 1.0000 "application " | TOPICAL_OINTMENT | Freq: Two times a day (BID) | CUTANEOUS | 1 refills | Status: DC

## 2021-11-07 NOTE — Patient Instructions (Signed)

## 2021-11-20 ENCOUNTER — Encounter: Payer: Self-pay | Admitting: Dermatology

## 2021-11-26 ENCOUNTER — Encounter: Payer: Self-pay | Admitting: Dermatology

## 2021-11-26 NOTE — Progress Notes (Signed)
° °  Follow-Up Visit   Subjective  Welcome Fults is a 51 y.o. male who presents for the following: Annual Exam (Lesion back lower leg, x 3 months. Lesion right upper arm, x 4 months. Lesion on left lower arm x 3 months. Lesion chest 3 months.).  Annual skin examination, several areas to check and rash on torso and legs. Location:  Duration:  Quality:  Associated Signs/Symptoms: Modifying Factors:  Severity:  Timing: Context:   Objective  Well appearing patient in no apparent distress; mood and affect are within normal limits. General skin examination done, no atypical pigmented lesions (all checked with dermoscopy).  1 possible nonmelanoma skin cancer right upper arm will be biopsied.  Widespread subtle papular dermatitis particularly on torso, photos taken.  Diagnosis uncertain: Adult papular eczema, atypical Grovers disease, nonbullous pemphigoid, other.       Right Upper Arm - Anterior 9 mm verrucous crust       Right Lower Leg - Anterior Psoriasiform dermatitis leg, differential includes neurodermatitis, nummular eczema, stasis.    A full examination was performed including scalp, head, eyes, ears, nose, lips, neck, chest, axillae, abdomen, back, buttocks, bilateral upper extremities, bilateral lower extremities, hands, feet, fingers, toes, fingernails, and toenails. All findings within normal limits unless otherwise noted below.   Assessment & Plan    Screening exam for skin cancer  Annual skin examination.  Other eczema  We will try clobetasol daily after bathing for 4 weeks; recheck and possibly biopsy at that time.  Neoplasm of uncertain behavior of skin Right Upper Arm - Anterior  Skin / nail biopsy Type of biopsy: tangential   Informed consent: discussed and consent obtained   Timeout: patient name, date of birth, surgical site, and procedure verified   Anesthesia: the lesion was anesthetized in a standard fashion   Anesthetic:  1% lidocaine w/  epinephrine 1-100,000 local infiltration Instrument used: flexible razor blade   Hemostasis achieved with: ferric subsulfate and electrodesiccation   Outcome: patient tolerated procedure well   Post-procedure details: wound care instructions given    Specimen 1 - Surgical pathology Differential Diagnosis: R/O BCC VS SCC  Check Margins: No  Psoriasis Right Lower Leg - Anterior  Daily clobetasol after bathing for 4 weeks.  Related Medications clobetasol ointment (TEMOVATE) 2.50 % Apply 1 application topically 2 (two) times daily.  Other fatigue  Related Procedures QuantiFERON-TB Gold Plus      I, Lavonna Monarch, MD, have reviewed all documentation for this visit.  The documentation on 11/26/21 for the exam, diagnosis, procedures, and orders are all accurate and complete.

## 2021-12-25 ENCOUNTER — Other Ambulatory Visit (HOSPITAL_COMMUNITY): Payer: Self-pay | Admitting: Psychiatry

## 2021-12-25 DIAGNOSIS — F331 Major depressive disorder, recurrent, moderate: Secondary | ICD-10-CM

## 2021-12-27 ENCOUNTER — Encounter: Payer: Self-pay | Admitting: "Endocrinology

## 2021-12-27 ENCOUNTER — Encounter: Payer: Self-pay | Admitting: Family Medicine

## 2021-12-28 ENCOUNTER — Other Ambulatory Visit: Payer: 59

## 2021-12-31 ENCOUNTER — Encounter (HOSPITAL_COMMUNITY): Payer: Self-pay | Admitting: Psychiatry

## 2021-12-31 ENCOUNTER — Telehealth (HOSPITAL_BASED_OUTPATIENT_CLINIC_OR_DEPARTMENT_OTHER): Payer: 59 | Admitting: Psychiatry

## 2021-12-31 DIAGNOSIS — F331 Major depressive disorder, recurrent, moderate: Secondary | ICD-10-CM | POA: Diagnosis not present

## 2021-12-31 DIAGNOSIS — F410 Panic disorder [episodic paroxysmal anxiety] without agoraphobia: Secondary | ICD-10-CM | POA: Diagnosis not present

## 2021-12-31 DIAGNOSIS — F4010 Social phobia, unspecified: Secondary | ICD-10-CM | POA: Diagnosis not present

## 2021-12-31 MED ORDER — LORAZEPAM 0.5 MG PO TABS: ORAL_TABLET | ORAL | 2 refills | Status: DC

## 2021-12-31 MED ORDER — CLOMIPRAMINE HCL 25 MG PO CAPS: 25.0000 mg | ORAL_CAPSULE | Freq: Two times a day (BID) | ORAL | 2 refills | Status: DC

## 2021-12-31 MED ORDER — TRAZODONE HCL 100 MG PO TABS: 100.0000 mg | ORAL_TABLET | Freq: Every evening | ORAL | 0 refills | Status: DC | PRN

## 2021-12-31 NOTE — Progress Notes (Signed)
Virtual Visit via Telephone Note ? ?I connected with [name] on 12/31/21 at  3:00 PM EDT by telephone and verified that I am speaking with the correct person using two identifiers. ? ?Location: ?Patient: Home ?Provider: Home Office ?  ?I discussed the limitations, risks, security and privacy concerns of performing an evaluation and management service by telephone and the availability of in person appointments. I also discussed with the patient that there may be a patient responsible charge related to this service. The patient expressed understanding and agreed to proceed. ? ? ?History of Present Illness: ?Patient is evaluated by phone session.  He is taking his medication as prescribed.  He he sleeps good with the help of melatonin and trazodone.  His job is going well and is working from home.  He denies any major panic attack.  He takes Ativan 0.5 mg every morning and really he takes a second dose.  His back is much better since pain is under control.  Recently he had a blood work and his testosterone level remains low.  He is hoping his physician may restart the testosterone since his CBC is normal.  It was held because of high hemoglobin.  He reported his depression is a stable denies any crying spells or any feeling of hopelessness or worthlessness.  His appetite is okay.  His weight is stable.  Like to keep his current medication.  He feels a current medicine is helping him so he can go outside to the grocery store without any feeling anxious or overwhelmed. ? ?Past Psychiatric History:  ?H/O anxiety and depression. Saw provider at neuropsychiatry in [place] but not happy with the treatment.  Saw Dr. Daron Offer in 2018 and prescribed Prozac, Xanax, Effexor, Celexa, propanolol, Lamictal, Vistaril, Adderall, Wellbutrin,  Cymbalta (increased irritability ), Anafranil and Luvox (did not help). H/O of ECT with no response.  No h/o suicidal attempt or inpatient. At [facility] tried [place], Klonopin but did  not work. H/O heavy drinking but claims to be sober.   ? ? ?Psychiatric Specialty Exam: ?Physical Exam  ?Review of Systems  ?Weight 282 lb (127.9 kg).There is no height or weight on file to calculate BMI.  ?General Appearance: NA  ?Eye Contact:  NA  ?Speech:  Clear and Coherent and Normal Rate  ?Volume:  Normal  ?Mood:  Euthymic  ?Affect:  NA  ?Thought Process:  Goal Directed  ?Orientation:  Full (Time, Place, and Person)  ?Thought Content:  WDL  ?Suicidal Thoughts:  No  ?Homicidal Thoughts:  No  ?Memory:  Immediate;   Good ?Recent;   Good ?Remote;   Fair  ?Judgement:  Intact  ?Insight:  Present  ?Psychomotor Activity:  NA  ?Concentration:  Concentration: Good and Attention Span: Good  ?Recall:  Good  ?Fund of Knowledge:  Good  ?Language:  Good  ?Akathisia:  No  ?Handed:  Right  ?AIMS (if indicated):     ?Assets:  Communication Skills ?Desire for Improvement ?Housing ?Social Support  ?ADL's:  Intact  ?Cognition:  WNL  ?Sleep:   ok with trazodone and melatonin.  ? ? ? ? ?Assessment and Plan: ?Major depressive disorder, recurrent.  Social anxiety disorder.  Panic attacks. ? ?Patient is stable on his current medication.  We will continue Anafranil 25 mg twice a day, trazodone 100 mg at bedtime and Ativan 0.5 mg daily and if needed second pill for severe anxiety.  Recommended to call us back if is any question or any concern.  Follow-up in 3  months. ? ?Follow Up Instructions: ? ?  ?I discussed the assessment and treatment plan with the patient. The patient was provided an opportunity to ask questions and all were answered. The patient agreed with the plan and demonstrated an understanding of the instructions. ?  ?The patient was advised to call back or seek an in-person evaluation if the symptoms worsen or if the condition fails to improve as anticipated. ? ?Collaboration of Care: Other provider involved in patient's care AEB Notes in Epic to review. ? ?Patient/Guardian was advised Release of Information must be  obtained prior to any record release in order to collaborate their care with an outside provider. Patient/Guardian was advised if they have not already done so to contact the registration department to sign all necessary forms in order for [place] to release information regarding their care.  ? ?Consent: Patient/Guardian gives verbal consent for treatment and assignment of benefits for services provided during this visit. Patient/Guardian expressed understanding and agreed to proceed.   ? ?I provided 17 minutes of non-face-to-face time during this encounter. ? ? ?Kathlee Nations, MD  ?

## 2022-01-01 LAB — QUANTIFERON-TB GOLD PLUS
Mitogen-NIL: >10 IU/mL
NIL: 0.04 IU/mL
QuantiFERON-TB Gold Plus: NEGATIVE
TB1-NIL: 0 IU/mL
TB2-NIL: 0.02 IU/mL

## 2022-01-02 LAB — TESTOSTERONE, FREE, TOTAL, SHBG
Sex Hormone Binding: 26 nmol/L (ref 19.3–76.4)
Testosterone, Free: 2.8 pg/mL — ABNORMAL LOW (ref 7.2–24.0)
Testosterone: 169 ng/dL — ABNORMAL LOW (ref 264–916)

## 2022-01-02 LAB — CBC WITH DIFFERENTIAL/PLATELET
Basophils Absolute: 0.1 10*3/uL (ref 0.0–0.2)
Basos: 1 %
EOS (ABSOLUTE): 0.1 10*3/uL (ref 0.0–0.4)
Eos: 2 %
Hematocrit: 49.4 % (ref 37.5–51.0)
Hemoglobin: 16.5 g/dL (ref 13.0–17.7)
Immature Grans (Abs): 0 10*3/uL (ref 0.0–0.1)
Immature Granulocytes: 0 %
Lymphocytes Absolute: 2.9 10*3/uL (ref 0.7–3.1)
Lymphs: 44 %
MCH: 30.6 pg (ref 26.6–33.0)
MCHC: 33.4 g/dL (ref 31.5–35.7)
MCV: 92 fL (ref 79–97)
Monocytes Absolute: 0.6 10*3/uL (ref 0.1–0.9)
Monocytes: 9 %
Neutrophils Absolute: 2.9 10*3/uL (ref 1.4–7.0)
Neutrophils: 44 %
Platelets: 265 10*3/uL (ref 150–450)
RBC: 5.39 x10E6/uL (ref 4.14–5.80)
RDW: 13 % (ref 11.6–15.4)
WBC: 6.6 10*3/uL (ref 3.4–10.8)

## 2022-01-05 ENCOUNTER — Other Ambulatory Visit: Payer: Self-pay | Admitting: Family Medicine

## 2022-01-10 ENCOUNTER — Ambulatory Visit: Payer: 59 | Admitting: "Endocrinology

## 2022-01-10 ENCOUNTER — Encounter: Payer: Self-pay | Admitting: "Endocrinology

## 2022-01-10 VITALS — BP 112/80 | HR 68 | Ht 74.0 in | Wt 273.4 lb

## 2022-01-10 DIAGNOSIS — Z6835 Body mass index (BMI) 35.0-35.9, adult: Secondary | ICD-10-CM

## 2022-01-10 DIAGNOSIS — R7303 Prediabetes: Secondary | ICD-10-CM

## 2022-01-10 DIAGNOSIS — E291 Testicular hypofunction: Secondary | ICD-10-CM | POA: Diagnosis not present

## 2022-01-10 MED ORDER — TESTOSTERONE CYPIONATE 200 MG/ML IJ SOLN: 100.0000 mg | INTRAMUSCULAR | 1 refills | Status: DC

## 2022-01-10 MED ORDER — SYRINGE/NEEDLE (DISP) 21G X 1-1/2" 3 ML MISC
1 refills | Status: DC
Start: 2022-01-10 — End: 2023-06-03

## 2022-01-10 NOTE — Progress Notes (Signed)
01/10/2022 ?    ? ?Endocrinology follow-up note ? ? ?HPI: ?Paul Parrish is a 51 y.o.-year-old man. ? ?- He is being seen in follow-up for  hypogonadism.  He was diagnosed with hypogonadism in 2014, etiology unclear.   ?-Due to polycythemia, he undergoes phlebotomy periodically.  During his last 2 visits, he was found to have polycythemia which forced discontinuation of the testosterone supplement.  His previsit labs show low total testosterone of 169 along with correction of his CBC. ? ?Patient wishes to be back on his testosterone due to reoccurrence of his previous symptoms including fatigue, low libido. ? ?-He reports history of seizures as a young man until his teenage years. ?-He denies any history of head injury. ? ?- He also is recently diagnosed with sleep apnea currently on CPAP.  ?- He fathers 2 teenage boys biologically. ?- He has significant mood disorder on multiple antidepressants and antianxiety. ?No herbal medicines. ?-He denies family history of premature coronary artery disease. ? ? ?ROS: ?Limited as above. ? ?PE: ?BP 112/80   Pulse 68   Ht 6' 2"  (1.88 m)   Wt 273 lb 6.4 oz (124 kg)   BMI 35.10 kg/m?  ?Wt Readings from Last 3 Encounters:  ?01/10/22 273 lb 6.4 oz (124 kg)  ?10/15/21 282 lb 3.2 oz (128 kg)  ?10/11/21 279 lb 12.8 oz (126.9 kg)  ? ? ? ?Recent Results (from the past 2160 hour(s))  ?TSH     Status: None  ? Collection Time: 10/15/21  5:02 PM  ?Result Value Ref Range  ? TSH 2.620 0.450 - 4.500 uIU/mL  ?Basic metabolic panel     Status: Abnormal  ? Collection Time: 10/15/21  5:02 PM  ?Result Value Ref Range  ? Glucose 102 (H) 70 - 99 mg/dL  ? BUN 12 6 - 24 mg/dL  ? Creatinine, Ser 1.09 0.76 - 1.27 mg/dL  ? eGFR 83 >59 mL/min/1.73  ? BUN/Creatinine Ratio 11 9 - 20  ? Sodium 137 134 - 144 mmol/L  ? Potassium 4.4 3.5 - 5.2 mmol/L  ? Chloride 99 96 - 106 mmol/L  ? CO2 25 20 - 29 mmol/L  ? Calcium 9.0 8.7 - 10.2 mg/dL  ?CBC     Status: Abnormal  ? Collection Time: 10/15/21  5:02 PM  ?Result  Value Ref Range  ? WBC 7.0 3.4 - 10.8 x10E3/uL  ? RBC 6.02 (H) 4.14 - 5.80 x10E6/uL  ? Hemoglobin 18.0 (H) 13.0 - 17.7 g/dL  ? Hematocrit 52.5 (H) 37.5 - 51.0 %  ? MCV 87 79 - 97 fL  ? MCH 29.9 26.6 - 33.0 pg  ? MCHC 34.3 31.5 - 35.7 g/dL  ? RDW 14.9 11.6 - 15.4 %  ? Platelets 284 150 - 450 x10E3/uL  ?QuantiFERON-TB Gold Plus     Status: None  ? Collection Time: 12/27/21  3:37 PM  ?Result Value Ref Range  ? QuantiFERON-TB Gold Plus NEGATIVE NEGATIVE  ?  Comment: Negative test result. M. tuberculosis complex  ?infection unlikely. ?  ? NIL 0.04 IU/mL  ? Mitogen-NIL >10.00 IU/mL  ? TB1-NIL 0.00 IU/mL  ? TB2-NIL 0.02 IU/mL  ?  Comment: . ?The Nil tube value reflects the background interferon ?gamma immune response of the patient's blood sample. ?This value has been subtracted from the patient's ?displayed TB and Mitogen results. ?. ?Lower than expected results with the Mitogen tube ?prevent false-negative Quantiferon readings by ?detecting a patient with a potential immune ?suppressive condition and/or suboptimal pre-analytical ?specimen  handling. ?. ?The TB1 Antigen tube is coated with the ?M. tuberculosis-specific antigens designed to elicit ?responses from TB antigen primed CD4+ helper ?T-lymphocytes. ?. ?The TB2 Antigen tube is coated with the ?M. tuberculosis-specific antigens designed to elicit ?responses from TB antigen primed CD4+ helper and CD8+ ?cytotoxic T-lymphocytes. ?. ?For additional information, please refer to ?https://education.questdiagnostics.com/faq/FAQ204 ?(This link is being provided for informational/ ?educational purposes only.) ?. ?  ?CBC with Differential/Platelet     Status: None  ? Collection Time: 12/28/21  8:23 AM  ?Result Value Ref Range  ? WBC 6.6 3.4 - 10.8 x10E3/uL  ? RBC 5.39 4.14 - 5.80 x10E6/uL  ? Hemoglobin 16.5 13.0 - 17.7 g/dL  ? Hematocrit 49.4 37.5 - 51.0 %  ? MCV 92 79 - 97 fL  ? MCH 30.6 26.6 - 33.0 pg  ? MCHC 33.4 31.5 - 35.7 g/dL  ? RDW 13.0 11.6 - 15.4 %  ? Platelets 265  150 - 450 x10E3/uL  ? Neutrophils 44 Not Estab. %  ? Lymphs 44 Not Estab. %  ? Monocytes 9 Not Estab. %  ? Eos 2 Not Estab. %  ? Basos 1 Not Estab. %  ? Neutrophils Absolute 2.9 1.4 - 7.0 x10E3/uL  ? Lymphocytes Absolute 2.9 0.7 - 3.1 x10E3/uL  ? Monocytes Absolute 0.6 0.1 - 0.9 x10E3/uL  ? EOS (ABSOLUTE) 0.1 0.0 - 0.4 x10E3/uL  ? Basophils Absolute 0.1 0.0 - 0.2 x10E3/uL  ? Immature Granulocytes 0 Not Estab. %  ? Immature Grans (Abs) 0.0 0.0 - 0.1 x10E3/uL  ?Testosterone, Free, Total, SHBG     Status: Abnormal  ? Collection Time: 12/28/21  8:23 AM  ?Result Value Ref Range  ? Testosterone 169 (L) 264 - 916 ng/dL  ?  Comment: Adult male reference interval is based on a population of ?healthy nonobese males (BMI <30) between 51 and 51 years old. ?[place], [place] 2017,102;1161-1173. PMID: [id number]. ?  ? Testosterone, Free 2.8 (L) 7.2 - 24.0 pg/mL  ? Sex Hormone Binding 26.0 19.3 - 76.4 nmol/L  ? ? ? ? ?ASSESSMENT: ?1.  Hypogonadism  ?2. Erectile Dysfunction ?3.  Polycythemia ?4.  Prediabetes ? ?PLAN:  ?1. Hypogonadism ? -He did phlebotomy at least once since his last visit.  His recent labs are showing correction of erythrocytosis.  His total testosterone remains low at 169.  He wishes to be back on his testosterone.   ? ?He wishes to be reconsidered for testosterone supplement 100 mg every 14 days with plan to repeat total testosterone, CBC and CMP in 3 months.    ?His history of  secondary polycythemia which continues to require periodic phlebotomy, as well as sleep apnea requiring CPAP treatment making him a poor candidate for more generous testosterone replacement. ?Regarding his weight concern: ?- he acknowledges that there is a room for improvement in his food and drink choices. ?- Suggestion is made for him to avoid simple carbohydrates  from his diet including Cakes, Sweet Desserts, Ice Cream, Soda (diet and regular), Sweet Tea, Candies, Chips, Cookies, Store Bought Juices, Alcohol , Artificial  Sweeteners,  Coffee Creamer, and "Sugar-free" Products, Lemonade. This will help patient to have more stable blood glucose profile and potentially avoid unintended weight gain. ? ? ?The following Lifestyle Medicine recommendations according to [facility]) were discussed and and offered to patient and he  agrees to start the journey:  ?A. Whole Foods, Plant-Based Nutrition comprising of fruits and vegetables, plant-based proteins, whole-grain carbohydrates was  discussed in detail with the patient.   A list for source of those nutrients were also provided to the patient.  Patient will use only water or unsweetened tea for hydration. ?B.  The need to stay away from risky substances including alcohol, smoking; obtaining 7 to 9 hours of restorative sleep, at least 150 minutes of moderate intensity exercise weekly, the importance of healthy social connections,  and stress management techniques were discussed. ?C.  A full color page of  Calorie density of various food groups per pound showing examples of each food groups was provided to the patient. ? ?He is advised to continue close follow-up with his PMD for primary care needs. ? ?I spent 25 minutes in the care of the patient today including review of labs from Thyroid Function, CMP, and other relevant labs ; imaging/biopsy records (current and previous including abstractions from other facilities); face-to-face time discussing  his lab results and symptoms, medications doses, his options of short and long term treatment based on the latest standards of care / guidelines;   and documenting the encounter. ? ?Melik Blancett  participated in the discussions, expressed understanding, and voiced agreement with the above plans.  All questions were answered to his satisfaction. he is encouraged to contact clinic should he have any questions or concerns prior to his return visit. ? ? ?Glade Lloyd, MD ?Phone: [phone]  Fax: [phone]  ?-   This note was partially dictated with voice recognition software. Similar sounding words can be transcribed inadequately or may not  be corrected upon review. ? ?01/10/2022, 4:48 PM ?

## 2022-01-15 ENCOUNTER — Ambulatory Visit: Payer: 59 | Admitting: Cardiovascular Disease

## 2022-01-15 ENCOUNTER — Telehealth: Payer: Self-pay | Admitting: *Deleted

## 2022-01-15 NOTE — Telephone Encounter (Signed)
Patients [employer]ri[employer] approved. Copay is 5$. Delivery is set for 01/16/22. ?

## 2022-02-04 ENCOUNTER — Encounter: Payer: Self-pay | Admitting: Family Medicine

## 2022-02-04 ENCOUNTER — Other Ambulatory Visit: Payer: Self-pay

## 2022-02-04 DIAGNOSIS — E782 Mixed hyperlipidemia: Secondary | ICD-10-CM

## 2022-02-07 ENCOUNTER — Other Ambulatory Visit: Payer: 59

## 2022-02-07 DIAGNOSIS — E782 Mixed hyperlipidemia: Secondary | ICD-10-CM

## 2022-02-08 LAB — CBC WITH DIFFERENTIAL/PLATELET
Basophils Absolute: 0.1 10*3/uL (ref 0.0–0.2)
Basos: 1 %
EOS (ABSOLUTE): 0.1 10*3/uL (ref 0.0–0.4)
Eos: 2 %
Hematocrit: 50.2 % (ref 37.5–51.0)
Hemoglobin: 17.8 g/dL — ABNORMAL HIGH (ref 13.0–17.7)
Immature Grans (Abs): 0 10*3/uL (ref 0.0–0.1)
Immature Granulocytes: 0 %
Lymphocytes Absolute: 2.8 10*3/uL (ref 0.7–3.1)
Lymphs: 41 %
MCH: 31.7 pg (ref 26.6–33.0)
MCHC: 35.5 g/dL (ref 31.5–35.7)
MCV: 90 fL (ref 79–97)
Monocytes Absolute: 0.8 10*3/uL (ref 0.1–0.9)
Monocytes: 11 %
Neutrophils Absolute: 3.1 10*3/uL (ref 1.4–7.0)
Neutrophils: 45 %
Platelets: 235 10*3/uL (ref 150–450)
RBC: 5.61 x10E6/uL (ref 4.14–5.80)
RDW: 12.4 % (ref 11.6–15.4)
WBC: 6.9 10*3/uL (ref 3.4–10.8)

## 2022-02-08 LAB — LIPID PANEL
Chol/HDL Ratio: 4.8 ratio (ref 0.0–5.0)
Cholesterol, Total: 134 mg/dL (ref 100–199)
HDL: 28 mg/dL — ABNORMAL LOW (ref >39)
LDL Chol Calc (NIH): 67 mg/dL (ref 0–99)
Triglycerides: 236 mg/dL — ABNORMAL HIGH (ref 0–149)
VLDL Cholesterol Cal: 39 mg/dL (ref 5–40)

## 2022-02-11 ENCOUNTER — Ambulatory Visit: Payer: 59 | Admitting: Family Medicine

## 2022-02-11 ENCOUNTER — Encounter: Payer: Self-pay | Admitting: Family Medicine

## 2022-02-11 VITALS — BP 124/73 | HR 69 | Ht 74.0 in | Wt 261.0 lb

## 2022-02-11 DIAGNOSIS — I10 Essential (primary) hypertension: Secondary | ICD-10-CM

## 2022-02-11 DIAGNOSIS — E781 Pure hyperglyceridemia: Secondary | ICD-10-CM

## 2022-02-11 DIAGNOSIS — R7303 Prediabetes: Secondary | ICD-10-CM

## 2022-02-11 DIAGNOSIS — E782 Mixed hyperlipidemia: Secondary | ICD-10-CM | POA: Diagnosis not present

## 2022-02-11 DIAGNOSIS — Z6833 Body mass index (BMI) 33.0-33.9, adult: Secondary | ICD-10-CM

## 2022-02-11 DIAGNOSIS — E6609 Other obesity due to excess calories: Secondary | ICD-10-CM

## 2022-02-11 MED ORDER — SEMAGLUTIDE-WEIGHT MANAGEMENT 0.5 MG/0.5ML ~~LOC~~ SOAJ: 0.5000 mg | SUBCUTANEOUS | 0 refills | Status: AC

## 2022-02-11 MED ORDER — SEMAGLUTIDE-WEIGHT MANAGEMENT 1.7 MG/0.75ML ~~LOC~~ SOAJ: 1.7000 mg | SUBCUTANEOUS | 0 refills | Status: DC

## 2022-02-11 MED ORDER — SEMAGLUTIDE-WEIGHT MANAGEMENT 2.4 MG/0.75ML ~~LOC~~ SOAJ: 2.4000 mg | SUBCUTANEOUS | 2 refills | Status: DC

## 2022-02-11 MED ORDER — SEMAGLUTIDE-WEIGHT MANAGEMENT 1 MG/0.5ML ~~LOC~~ SOAJ: 1.0000 mg | SUBCUTANEOUS | 0 refills | Status: AC

## 2022-02-11 MED ORDER — ROSUVASTATIN CALCIUM 10 MG PO TABS: 10.0000 mg | ORAL_TABLET | Freq: Every day | ORAL | 3 refills | Status: DC

## 2022-02-11 MED ORDER — SEMAGLUTIDE-WEIGHT MANAGEMENT 0.25 MG/0.5ML ~~LOC~~ SOAJ: 0.2500 mg | SUBCUTANEOUS | 0 refills | Status: AC

## 2022-02-11 NOTE — Progress Notes (Signed)
? ?BP 124/73   Pulse 69   Ht '6\' 2"'$  (1.88 m)   Wt 261 lb (118.4 kg)   SpO2 98%   BMI 33.51 kg/m?   ? ?Subjective:  ? ?Patient ID: Paul Parrish, male    DOB: 1971-08-11, 51 y.o.   MRN: [id number] ? ?HPI: ?Paul Parrish is a 51 y.o. male presenting on 02/11/2022 for Medical Management of Chronic Issues and Hypertension ? ? ?HPI ?Hypertension ?Patient is currently on metoprolol, and their blood pressure today is 124/73. Patient denies any lightheadedness or dizziness. Patient denies headaches, blurred vision, chest pains, shortness of breath, or weakness. Denies any side effects from medication and is content with current medication.  ? ?Hyperlipidemia and hypertriglyceridemia ?Patient is coming in for recheck of his hyperlipidemia. The patient is currently taking Crestor. They deny any issues with myalgias or history of liver damage from it. They deny any focal numbness or weakness or chest pain.  ? ?Prediabetes  ?patient comes in today for recheck of his diabetes. Patient has been currently taking no medication currently, diet control. Patient is not currently on an ACE inhibitor/ARB. Patient has not seen an ophthalmologist this year. Patient denies any issues with their feet. The symptom started onset as an adult hypertension and hyperlipidemia and obesity and hypertriglyceridemia ARE RELATED TO DM  ? ?Patient has been having trouble with weight, his wife has been doing [place] through her workplace and he does want to try it.  He does have hypertriglyceridemia and controlled hypertension and prediabetes as comorbidities. ? ?Patient sees endocrinology and dermatology for his psoriasis and polycythemia and hypogonadism.  They manage this ? ?Relevant past medical, surgical, family and social history reviewed and updated as indicated. Interim medical history since our last visit reviewed. ?Allergies and medications reviewed and updated. ? ?Review of Systems  ?Constitutional:  Negative for chills and fever.  ?Eyes:   Negative for visual disturbance.  ?Respiratory:  Negative for shortness of breath and wheezing.   ?Cardiovascular:  Negative for chest pain and leg swelling.  ?Musculoskeletal:  Negative for back pain and gait problem.  ?Skin:  Negative for rash.  ?Neurological:  Negative for dizziness, weakness and numbness.  ?All other systems reviewed and are negative. ? ?Per HPI unless specifically indicated above ? ? ?Allergies as of 02/11/2022   ? ?   Reactions  ? Amoxicillin Other (See Comments)  ? High fever and possible heart racing  ? Bactrim [sulfamethoxazole-trimethoprim] Other (See Comments)  ? Fever, body aches, chills  ? Penicillins Other (See Comments)  ? High fever ?Has patient had a PCN reaction causing immediate rash, facial/tongue/throat swelling, SOB or lightheadedness with hypotension: No ?Has patient had a PCN reaction causing severe rash involving mucus membranes or skin necrosis: No ?Has patient had a PCN reaction that required hospitalization No ?Has patient had a PCN reaction occurring within the last 10 years: No ?If all of the above answers are "NO", then may proceed with Cephalosporin use.  ? ?  ? ?  ?Medication List  ?  ? ?  ? Accurate as of Feb 11, 2022  3:53 PM. If you have any questions, ask your nurse or doctor.  ?  ?  ? ?  ? ?clobetasol ointment 0.05 % ?Commonly known as: TEMOVATE ?Apply 1 application topically 2 (two) times daily. ?  ?clomiPRAMINE 25 MG capsule ?Commonly known as: ANAFRANIL ?Take 1 capsule (25 mg total) by mouth 2 (two) times daily. ?  ?Fish Oil 1000 MG Caps ?Take 1 capsule  by mouth 2 (two) times daily. ?  ?LORazepam 0.5 MG tablet ?Commonly known as: Ativan ?Take one tab daily as needed for anxiety and 2nd if needed again ?  ?Melatonin 10 MG Tabs ?Take 1 tablet by mouth at bedtime. ?  ?metoprolol tartrate 25 MG tablet ?Commonly known as: LOPRESSOR ?Take 1 tablet (25 mg total) by mouth 2 (two) times daily. ?  ?MULTI-VITAMIN DAILY PO ?Take 1 capsule by mouth 2 (two) times daily.  Name: Life Extensions ?  ?oxyCODONE 5 MG immediate release tablet ?Commonly known as: Oxy IR/ROXICODONE ?Take 5 mg by mouth every 8 (eight) hours as needed. ?  ?rosuvastatin 10 MG tablet ?Commonly known as: Crestor ?Take 1 tablet (10 mg total) by mouth daily. ?  ?Semaglutide-Weight Management 0.25 MG/0.5ML Soaj ?Inject 0.25 mg into the skin once a week for 28 days. ?Started by: Worthy Rancher, MD ?  ?Semaglutide-Weight Management 0.5 MG/0.5ML Soaj ?Inject 0.5 mg into the skin once a week for 28 days. ?Start taking on: March 12, 2022 ?Started by: Worthy Rancher, MD ?  ?Semaglutide-Weight Management 1 MG/0.5ML Soaj ?Inject 1 mg into the skin once a week for 28 days. ?Start taking on: April 10, 2022 ?Started by: Worthy Rancher, MD ?  ?Semaglutide-Weight Management 1.7 MG/0.75ML Soaj ?Inject 1.7 mg into the skin once a week for 28 days. ?Start taking on: May 09, 2022 ?Started by: Worthy Rancher, MD ?  ?Semaglutide-Weight Management 2.4 MG/0.75ML Soaj ?Inject 2.4 mg into the skin once a week. ?Start taking on: June 07, 2022 ?Started by: Worthy Rancher, MD ?  ?sildenafil 20 MG tablet ?Commonly known as: REVATIO ?TAKE 1 TABLET BY MOUTH ONCE DAILY AS NEEDED ?  ?Skyrizi 150 MG/ML Sosy ?Generic drug: Risankizumab-rzaa ?Inject 1 pen into the skin every 3 (three) months. ?  ?Skyrizi Pen 150 MG/ML Soaj ?Generic drug: Risankizumab-rzaa ?Inject 150 mg into the skin as directed. Every 12 weeks for maintenance. ?  ?SYRINGE-NEEDLE (DISP) 3 ML 21G X 1-1/2" 3 ML Misc ?Use to inject testosterone every week ?  ?Testosterone Cypionate 200 MG/ML Soln ?Inject 100 mg as directed every 14 (fourteen) days. ?  ?traZODone 100 MG tablet ?Commonly known as: DESYREL ?Take 1 tablet (100 mg total) by mouth at bedtime as needed for sleep. ?  ? ?  ? ? ? ?Objective:  ? ?BP 124/73   Pulse 69   Ht '6\' 2"'$  (1.88 m)   Wt 261 lb (118.4 kg)   SpO2 98%   BMI 33.51 kg/m?   ?Wt Readings from Last 3 Encounters:  ?02/11/22 261 lb (118.4  kg)  ?01/10/22 273 lb 6.4 oz (124 kg)  ?10/15/21 282 lb 3.2 oz (128 kg)  ?  ?Physical Exam ?Vitals and nursing note reviewed.  ?Constitutional:   ?   General: He is not in acute distress. ?   Appearance: He is well-developed. He is not diaphoretic.  ?Eyes:  ?   General: No scleral icterus. ?   Conjunctiva/sclera: Conjunctivae normal.  ?Neck:  ?   Thyroid: No thyromegaly.  ?Cardiovascular:  ?   Rate and Rhythm: Normal rate and regular rhythm.  ?   Heart sounds: Normal heart sounds. No murmur heard. ?Pulmonary:  ?   Effort: Pulmonary effort is normal. No respiratory distress.  ?   Breath sounds: Normal breath sounds. No wheezing.  ?Musculoskeletal:     ?   General: No swelling. Normal range of motion.  ?   Cervical back: Neck supple.  ?Lymphadenopathy:  ?  Cervical: No cervical adenopathy.  ?Skin: ?   General: Skin is warm and dry.  ?   Findings: No rash.  ?Neurological:  ?   Mental Status: He is alert and oriented to person, place, and time.  ?   Coordination: Coordination normal.  ?Psychiatric:     ?   Behavior: Behavior normal.  ? ? ?Results for orders placed or performed in visit on 02/07/22  ?Lipid panel  ?Result Value Ref Range  ? Cholesterol, Total 134 100 - 199 mg/dL  ? Triglycerides 236 (H) 0 - 149 mg/dL  ? HDL 28 (L) >39 mg/dL  ? VLDL Cholesterol Cal 39 5 - 40 mg/dL  ? LDL Chol Calc (NIH) 67 0 - 99 mg/dL  ? Chol/HDL Ratio 4.8 0.0 - 5.0 ratio  ?CBC with Differential/Platelet  ?Result Value Ref Range  ? WBC 6.9 3.4 - 10.8 x10E3/uL  ? RBC 5.61 4.14 - 5.80 x10E6/uL  ? Hemoglobin 17.8 (H) 13.0 - 17.7 g/dL  ? Hematocrit 50.2 37.5 - 51.0 %  ? MCV 90 79 - 97 fL  ? MCH 31.7 26.6 - 33.0 pg  ? MCHC 35.5 31.5 - 35.7 g/dL  ? RDW 12.4 11.6 - 15.4 %  ? Platelets 235 150 - 450 x10E3/uL  ? Neutrophils 45 Not Estab. %  ? Lymphs 41 Not Estab. %  ? Monocytes 11 Not Estab. %  ? Eos 2 Not Estab. %  ? Basos 1 Not Estab. %  ? Neutrophils Absolute 3.1 1.4 - 7.0 x10E3/uL  ? Lymphocytes Absolute 2.8 0.7 - 3.1 x10E3/uL  ? Monocytes  Absolute 0.8 0.1 - 0.9 x10E3/uL  ? EOS (ABSOLUTE) 0.1 0.0 - 0.4 x10E3/uL  ? Basophils Absolute 0.1 0.0 - 0.2 x10E3/uL  ? Immature Granulocytes 0 Not Estab. %  ? Immature Grans (Abs) 0.0 0.0 - 0.1 x10E3/uL  ? ? ?Assessme

## 2022-02-20 ENCOUNTER — Telehealth: Payer: Self-pay | Admitting: *Deleted

## 2022-02-20 NOTE — Telephone Encounter (Signed)
Please let the patient know that Ozempic will not be covered for prediabetes, we attempted but it does not look like it is going to be covered.  He can discuss further by calling his insurance company if he would like but as far as our end it looks like its not going to be covered by them

## 2022-02-20 NOTE — Telephone Encounter (Signed)
Pt has been informed and will call back if needed.

## 2022-02-20 NOTE — Telephone Encounter (Signed)
Ozempic DENIED   Reason:  (1) You have a diagnosis of type 2 diabetes mellitus confirmed by accepted laboratory testing methodologies per treatment guidelines (for example, A1C greater than or equal to 6.5%, fasting plasma glucose greater than or equal to '126mg'$ /dL and/or 2-hour plasma glucose greater than or equal to '200mg'$ /dL). (2) You have a history of suboptimal response (after a three-month), contraindication or intolerance to metformin (generic Glucophage, Glucophage XR).

## 2022-02-21 ENCOUNTER — Encounter: Payer: Self-pay | Admitting: Family Medicine

## 2022-02-21 DIAGNOSIS — R7303 Prediabetes: Secondary | ICD-10-CM

## 2022-02-21 DIAGNOSIS — E6609 Other obesity due to excess calories: Secondary | ICD-10-CM

## 2022-02-21 MED ORDER — METFORMIN HCL 500 MG PO TABS
500.0000 mg | ORAL_TABLET | Freq: Two times a day (BID) | ORAL | 3 refills | Status: DC
Start: 2022-02-21 — End: 2022-11-25

## 2022-03-12 ENCOUNTER — Other Ambulatory Visit: Payer: Self-pay | Admitting: Family Medicine

## 2022-03-28 ENCOUNTER — Other Ambulatory Visit: Payer: Self-pay | Admitting: Dermatology

## 2022-04-03 ENCOUNTER — Encounter (HOSPITAL_COMMUNITY): Payer: Self-pay | Admitting: Psychiatry

## 2022-04-03 ENCOUNTER — Telehealth (HOSPITAL_BASED_OUTPATIENT_CLINIC_OR_DEPARTMENT_OTHER): Payer: 59 | Admitting: Psychiatry

## 2022-04-03 DIAGNOSIS — F4010 Social phobia, unspecified: Secondary | ICD-10-CM

## 2022-04-03 DIAGNOSIS — F331 Major depressive disorder, recurrent, moderate: Secondary | ICD-10-CM | POA: Diagnosis not present

## 2022-04-03 DIAGNOSIS — F410 Panic disorder [episodic paroxysmal anxiety] without agoraphobia: Secondary | ICD-10-CM | POA: Diagnosis not present

## 2022-04-03 IMAGING — MR MR LUMBAR SPINE W/O CM
5 series · 31 of 48 positions shown · non-contrast
Comparison: Plain films August 18, 2020.

CLINICAL DATA: Lumbar radiculopathy.

EXAM:
MRI LUMBAR SPINE WITHOUT CONTRAST
TECHNIQUE: Multiplanar, multisequence MR imaging of the lumbar spine was
performed. No intravenous contrast was administered.

[Series 5: T2 · sagittal · 4.0mm · 0.73mm/px · 6 of 15 slices shown (1 of 2)]
[im 1/15]
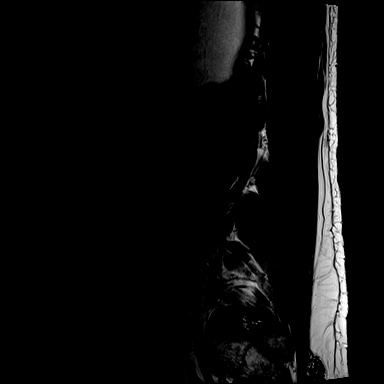
[im 3/15]
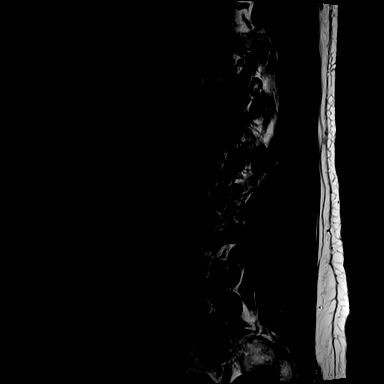
[im 6/15]
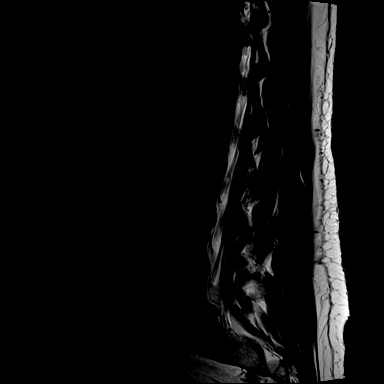
[im 9/15]
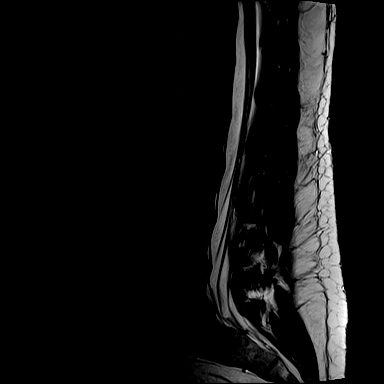
[im 12/15]
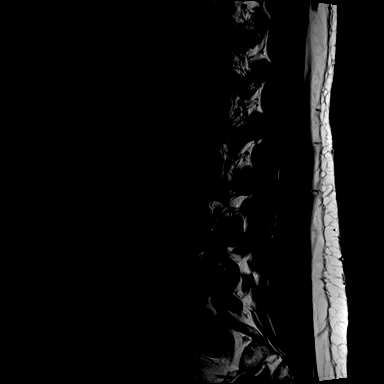
[im 15/15]
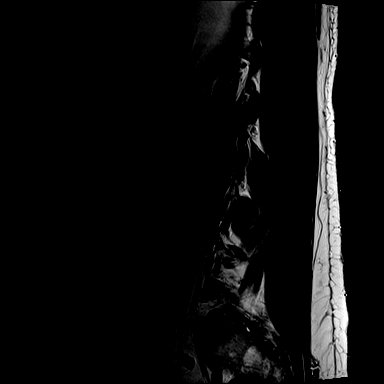

[Series 6: T1 · sagittal · 4.0mm · 0.88mm/px · 6 of 15 slices shown (1 of 2)]
[im 1/15]
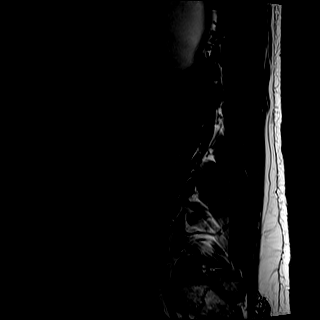
[im 3/15]
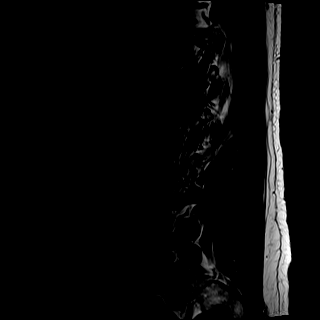
[im 6/15]
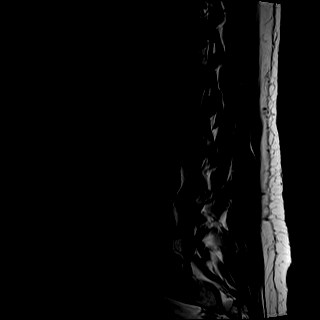
[im 9/15]
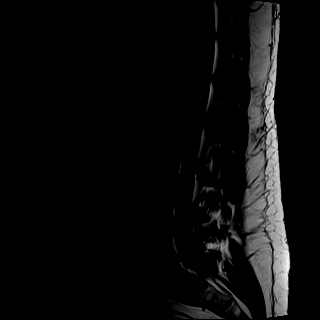
[im 12/15]
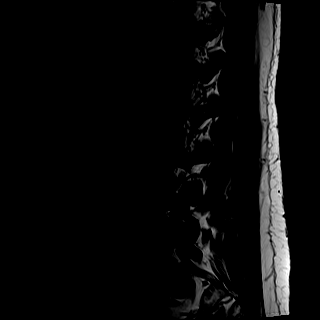
[im 15/15]
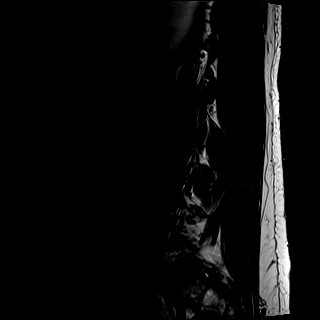

[Series 7: STIR · sagittal · 4.0mm · 0.55mm/px · 1 of 15 slices shown]
[im 1/15]
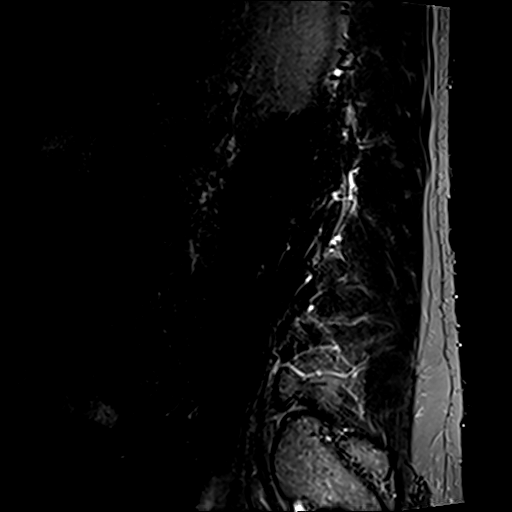

[Series 8: T2 · axial · 4.0mm · 0.70mm/px · z∈[-109,+120]mm · 9 of 35 slices shown (2 of 2)]
[im 1/35]
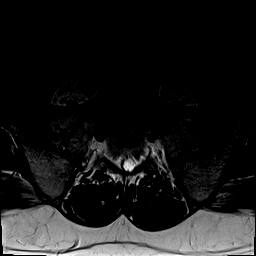
[im 5/35]
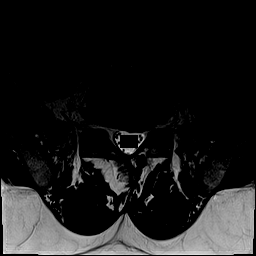
[im 10/35]
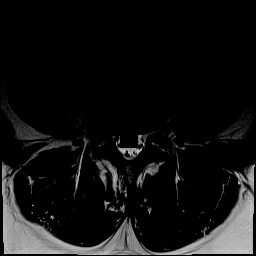
[im 15/35]
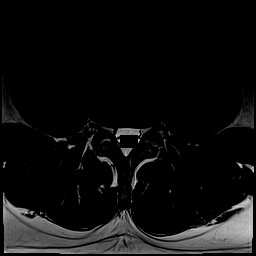
[im 18/35]
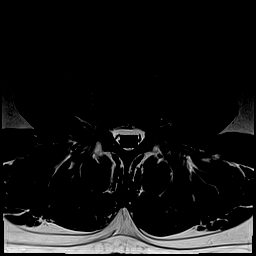
[im 20/35]
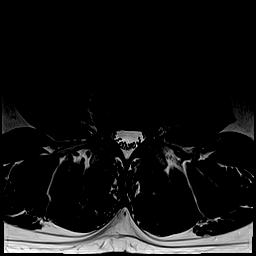
[im 25/35]
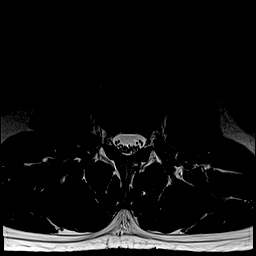
[im 30/35]
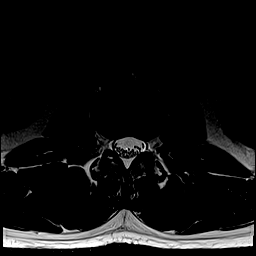
[im 35/35]
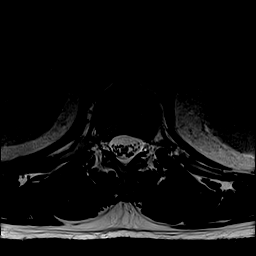

[Series 9: T1 · axial · 4.0mm · 0.35mm/px · z∈[-109,+120]mm · 9 of 35 slices shown (2 of 2)]
[im 1/35]
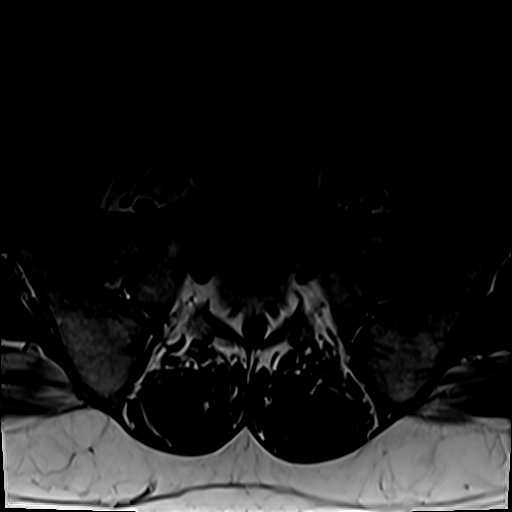
[im 5/35]
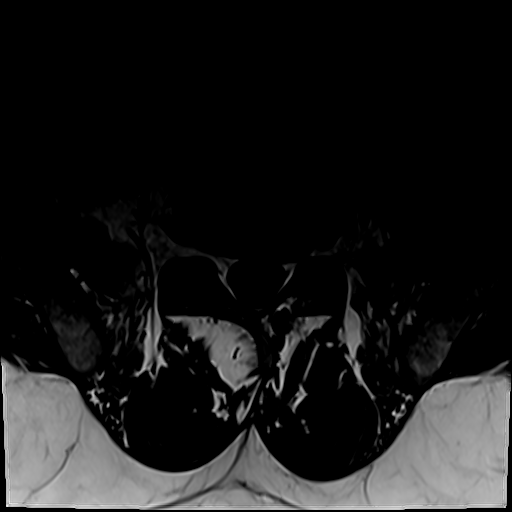
[im 10/35]
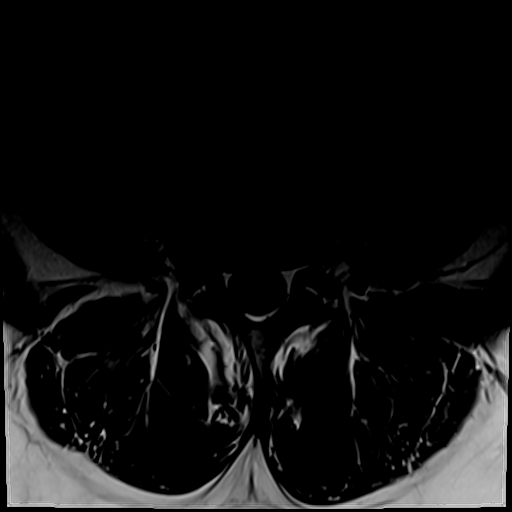
[im 15/35]
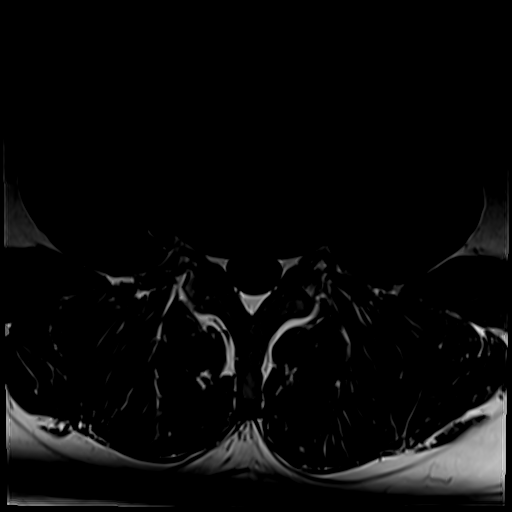
[im 18/35]
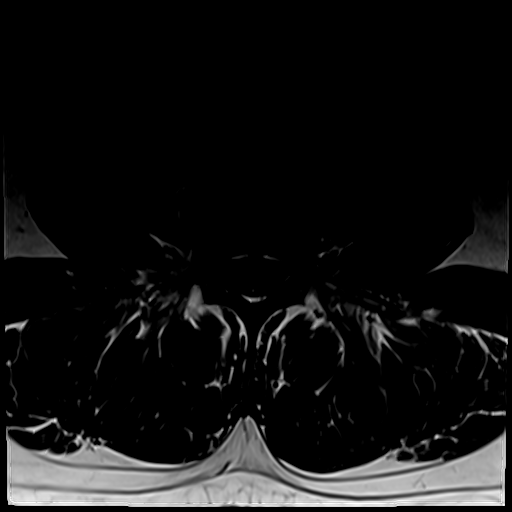
[im 20/35]
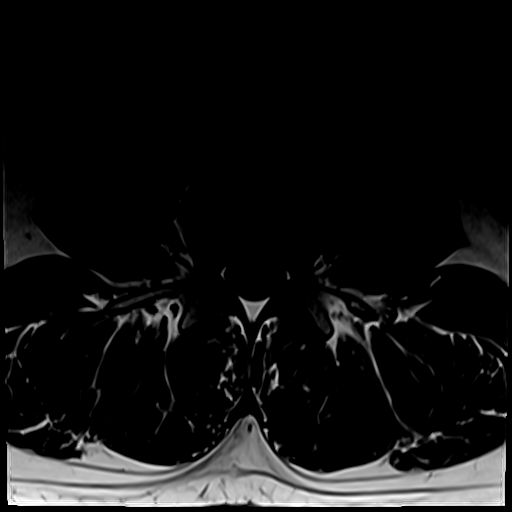
[im 25/35]
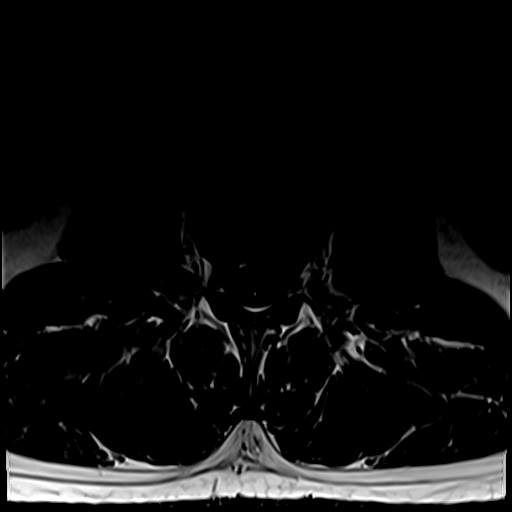
[im 30/35]
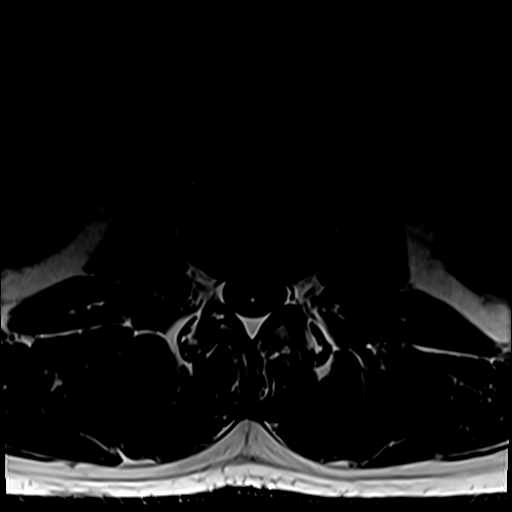
[im 35/35]
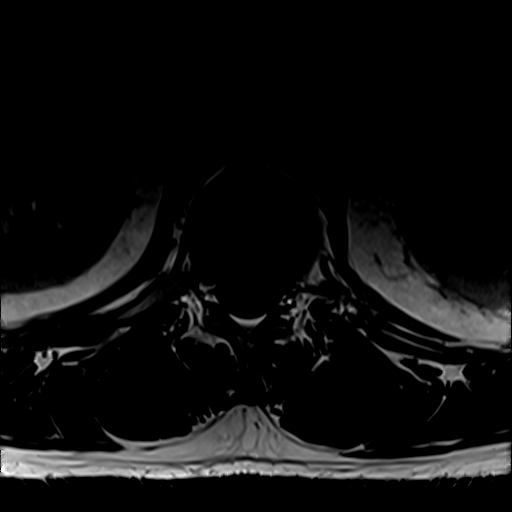

[31 of 48 positions shown; findings below may reference images not displayed]

FINDINGS: Segmentation:  Standard.

Alignment:  Physiologic.

Vertebrae:  No fracture, evidence of discitis, or bone lesion.

Conus medullaris and cauda equina: Conus extends to the T12-L1
level. Conus and cauda equina appear normal.

Paraspinal and other soft tissues: Negative.

Disc levels:

T12-L1: No spinal canal or neural foraminal stenosis.

L1-2: No spinal canal or neural foraminal stenosis.

L2-3: No spinal canal or neural foraminal stenosis.

L3-4: Shallow disc bulge and mild facet degenerative changes without
significant canal or neural foraminal stenosis.

L4-5: Shallow disc bulge with small right foraminal disc protrusion
with annular tear and mild facet degenerative changes resulting in
mild right neural foraminal narrowing. No spinal canal stenosis.

L5-S1: Left asymmetric disc bulge and mild facet degenerative
changes resulting in mild narrowing of the left subarticular zone.
No significant spinal canal or neural foraminal stenosis.
IMPRESSION: 1. Mild lumbar degenerative changes with mild right neural foraminal
narrowing at L4-L5 due to small right foraminal disc protrusion with
annular tear.
2. Mild narrowing of the left subarticular zone at L5-S1.
3. No significant spinal canal stenosis at any level.

## 2022-04-03 MED ORDER — LORAZEPAM 0.5 MG PO TABS: ORAL_TABLET | ORAL | 2 refills | Status: DC

## 2022-04-03 MED ORDER — CLOMIPRAMINE HCL 25 MG PO CAPS: 25.0000 mg | ORAL_CAPSULE | Freq: Two times a day (BID) | ORAL | 2 refills | Status: DC

## 2022-04-03 MED ORDER — TRAZODONE HCL 100 MG PO TABS: 100.0000 mg | ORAL_TABLET | Freq: Every evening | ORAL | 0 refills | Status: DC | PRN

## 2022-04-03 NOTE — Progress Notes (Signed)
Virtual Visit via Telephone Note  I connected with Paul Parrish on 04/03/22 at  3:20 PM EDT by telephone and verified that I am speaking with the correct person using two identifiers.  Location: Patient: home Provider: home office   I discussed the limitations, risks, security and privacy concerns of performing an evaluation and management service by telephone and the availability of in person appointments. I also discussed with the patient that there may be a patient responsible charge related to this service. The patient expressed understanding and agreed to proceed.   History of Present Illness: Patient is evaluated by phone session.  He is stable on his current medication.  His physician tried him on Ozempic but his insurance did not approve.  He is taking metformin.  His last hemoglobin A1c was much better.  He reported his anxiety overall much better and he does not feel very nervous and anxious around people for crowded places.  He also denies any panic attack.  He lives with his wife and his stepchild.  He works from home and he reported job is going well.  Denies any crying spells or any feeling of hopelessness or worthlessness.  His appetite is okay and he lost weight since the last visit.  He has no tremor or shakes or any EPS.  Past Psychiatric History:  H/O anxiety and depression. Saw provider at neuropsychiatry in [place] but not happy with the treatment.  Saw Dr. Daron Offer in 2018 and prescribed Prozac, Xanax, Effexor, Celexa, propanolol, Lamictal, Vistaril, Adderall, Wellbutrin,  Cymbalta (increased irritability ), Anafranil and Luvox (did not help). H/O of ECT with no response.  No h/o suicidal attempt or inpatient. At [facility] tried [place], Klonopin but did not work. H/O heavy drinking but claims to be sober.    Psychiatric Specialty Exam: Physical Exam  Review of Systems  Weight 261 lb (118.4 kg).There is no height or weight on file to calculate BMI.  General  Appearance: NA  Eye Contact:  NA  Speech:  Clear and Coherent and Normal Rate  Volume:  Normal  Mood:  Euthymic  Affect:  NA  Thought Process:  Goal Directed  Orientation:  Full (Time, Place, and Person)  Thought Content:  Logical  Suicidal Thoughts:  No  Homicidal Thoughts:  No  Memory:  Immediate;   Good Recent;   Good Remote;   Good  Judgement:  Good  Insight:  Present  Psychomotor Activity:  NA  Concentration:  Concentration: Good and Attention Span: Good  Recall:  Good  Fund of Knowledge:  Good  Language:  Good  Akathisia:  No  Handed:  Right  AIMS (if indicated):     Assets:  Communication Skills Desire for Improvement Housing Resilience Social Support Talents/Skills Transportation  ADL's:  Intact  Cognition:  WNL  Sleep:   fair. CPAP broke      Assessment and Plan: Major depressive disorder, recurrent.  Social anxiety disorder.  Panic attacks.  Patient is stable on his current medication.  We will continue Anafranil 25 mg twice a day, trazodone 100 mg at bedtime and Ativan 0.5 mg daily and he can take second if needed for severe anxiety.  Recommended to call us back if is any question or any concern.  Follow-up in 3 months.      Follow Up Instructions:    I discussed the assessment and treatment plan with the patient. The patient was provided an opportunity to ask questions and all were answered. The patient agreed with  the plan and demonstrated an understanding of the instructions.   The patient was advised to call back or seek an in-person evaluation if the symptoms worsen or if the condition fails to improve as anticipated.  Collaboration of Care: Primary Care Provider AEB notes are available in epic to review.  Patient/Guardian was advised Release of Information must be obtained prior to any record release in order to collaborate their care with an outside provider. Patient/Guardian was advised if they have not already done so to contact the  registration department to sign all necessary forms in order for [place] to release information regarding their care.   Consent: Patient/Guardian gives verbal consent for treatment and assignment of benefits for services provided during this visit. Patient/Guardian expressed understanding and agreed to proceed.    I provided 23 minutes of non-face-to-face time during this encounter.   Kathlee Nations, MD

## 2022-04-30 ENCOUNTER — Telehealth: Payer: Self-pay

## 2022-04-30 MED ORDER — SKYRIZI PEN 150 MG/ML ~~LOC~~ SOAJ: 150.0000 mg | SUBCUTANEOUS | 6 refills | Status: AC

## 2022-04-30 NOTE — Telephone Encounter (Signed)
refillok

## 2022-05-11 ENCOUNTER — Other Ambulatory Visit: Payer: Self-pay | Admitting: Family Medicine

## 2022-05-13 NOTE — Telephone Encounter (Signed)
Last office visit 02/11/22 Last refill 03/12/22, #60, no refills

## 2022-05-16 ENCOUNTER — Other Ambulatory Visit: Payer: 59

## 2022-05-21 ENCOUNTER — Ambulatory Visit: Payer: 59 | Admitting: "Endocrinology

## 2022-05-21 ENCOUNTER — Encounter: Payer: Self-pay | Admitting: "Endocrinology

## 2022-05-21 VITALS — BP 112/78 | HR 76 | Ht 74.0 in | Wt 269.2 lb

## 2022-05-21 DIAGNOSIS — Z6835 Body mass index (BMI) 35.0-35.9, adult: Secondary | ICD-10-CM

## 2022-05-21 DIAGNOSIS — R7303 Prediabetes: Secondary | ICD-10-CM

## 2022-05-21 DIAGNOSIS — E291 Testicular hypofunction: Secondary | ICD-10-CM

## 2022-05-21 MED ORDER — TESTOSTERONE CYPIONATE 200 MG/ML IJ SOLN: 100.0000 mg | INTRAMUSCULAR | 0 refills | Status: DC

## 2022-05-21 NOTE — Progress Notes (Signed)
05/21/2022      Endocrinology follow-up note   HPI: Paul Parrish is a 51 y.o.-year-old man.  - He is being seen in follow-up for  hypogonadism.  He was diagnosed with hypogonadism in 2014, etiology unclear.   -Due to polycythemia, he undergoes phlebotomy periodically.  During his last 2 visits, he was found to have polycythemia which forced discontinuation of the testosterone supplement.  Due to to his total testosterone being at 169 with correction of his CBC profile, he was restarted on testosterone injection 100 mg every 14 days.  He returns with improved plan testosterone 200, still favorable CBC and CMP profile.     Patient wishes to continue on testosterone treatment.  He reports improvement in his symptoms including fatigue and low libido.    -He reports history of seizures as a young man until his teenage years. -He denies any history of head injury.  - He also is recently diagnosed with sleep apnea currently on CPAP.  - He fathers 2 teenage boys biologically. - He has significant mood disorder on multiple antidepressants and antianxiety. No herbal medicines. -He denies family history of premature coronary artery disease.   ROS: Limited as above.  PE: BP 112/78   Pulse 76   Ht 6' 2"  (1.88 m)   Wt 269 lb 3.2 oz (122.1 kg)   BMI 34.56 kg/m  Wt Readings from Last 3 Encounters:  05/21/22 269 lb 3.2 oz (122.1 kg)  02/11/22 261 lb (118.4 kg)  01/10/22 273 lb 6.4 oz (124 kg)     Recent Results (from the past 2160 hour(s))  Testosterone, Free, Total, SHBG     Status: Abnormal (Preliminary result)   Collection Time: 05/16/22  9:14 AM  Result Value Ref Range   Testosterone 200 (L) 264 - 916 ng/dL    Comment: Adult male reference interval is based on a population of healthy nonobese males (BMI <30) between 95 and 20 years old. [place], [place] [phone]. PMID: [id number].    Testosterone, Free WILL FOLLOW    Sex Hormone Binding 21.8 19.3 - 76.4 nmol/L  CBC  with Differential/Platelet     Status: None   Collection Time: 05/16/22  9:14 AM  Result Value Ref Range   WBC 7.8 3.4 - 10.8 x10E3/uL   RBC 5.51 4.14 - 5.80 x10E6/uL   Hemoglobin 16.9 13.0 - 17.7 g/dL   Hematocrit 48.9 37.5 - 51.0 %   MCV 89 79 - 97 fL   MCH 30.7 26.6 - 33.0 pg   MCHC 34.6 31.5 - 35.7 g/dL   RDW 12.4 11.6 - 15.4 %   Platelets 250 150 - 450 x10E3/uL   Neutrophils 47 Not Estab. %   Lymphs 40 Not Estab. %   Monocytes 10 Not Estab. %   Eos 2 Not Estab. %   Basos 1 Not Estab. %   Neutrophils Absolute 3.7 1.4 - 7.0 x10E3/uL   Lymphocytes Absolute 3.1 0.7 - 3.1 x10E3/uL   Monocytes Absolute 0.8 0.1 - 0.9 x10E3/uL   EOS (ABSOLUTE) 0.1 0.0 - 0.4 x10E3/uL   Basophils Absolute 0.1 0.0 - 0.2 x10E3/uL   Immature Granulocytes 0 Not Estab. %   Immature Grans (Abs) 0.0 0.0 - 0.1 x10E3/uL  Comprehensive metabolic panel     Status: None   Collection Time: 05/16/22  9:14 AM  Result Value Ref Range   Glucose 84 70 - 99 mg/dL   BUN 12 6 - 24 mg/dL   Creatinine, Ser 0.96 0.76 - 1.27 mg/dL  eGFR 96 >59 mL/min/1.73   BUN/Creatinine Ratio 13 9 - 20   Sodium 138 134 - 144 mmol/L   Potassium 4.2 3.5 - 5.2 mmol/L   Chloride 98 96 - 106 mmol/L   CO2 23 20 - 29 mmol/L   Calcium 9.4 8.7 - 10.2 mg/dL   Total Protein 7.2 6.0 - 8.5 g/dL   Albumin 4.9 3.8 - 4.9 g/dL   Globulin, Total 2.3 1.5 - 4.5 g/dL   Albumin/Globulin Ratio 2.1 1.2 - 2.2   Bilirubin Total 0.4 0.0 - 1.2 mg/dL   Alkaline Phosphatase 65 44 - 121 IU/L   AST 18 0 - 40 IU/L   ALT 22 0 - 44 IU/L      ASSESSMENT: 1.  Hypogonadism  2. Erectile Dysfunction 3.  Polycythemia 4.  Prediabetes  PLAN:  1. Hypogonadism  -He did phlebotomy at least once since his last visit.  His recent labs are showing continued favorable CBC profile.  His total testosterone is 200 ng per DL.  He is benefiting from testosterone supplement.     He is advised to continue testosterone  100 mg every 14 days with plan to repeat total  testosterone, CBC and CMP in 4 months.    His history of  secondary polycythemia which continues to require periodic phlebotomy, as well as sleep apnea requiring CPAP treatment making him a poor candidate for more generous testosterone replacement. Regarding his weight concern: His insurance did not provide coverage for injectable weight loss medications. He remains a good fit for lifestyle medicine. - he acknowledges that there is a room for improvement in his food and drink choices. - Suggestion is made for him to avoid simple carbohydrates  from his diet including Cakes, Sweet Desserts, Ice Cream, Soda (diet and regular), Sweet Tea, Candies, Chips, Cookies, Store Bought Juices, Alcohol , Artificial Sweeteners,  Coffee Creamer, and "Sugar-free" Products, Lemonade. This will help patient to have more stable blood glucose profile and potentially avoid unintended weight gain.  The following Lifestyle Medicine recommendations according to [facility]) were discussed and and offered to patient and he  agrees to start the journey:  A. Whole Foods, Plant-Based Nutrition comprising of fruits and vegetables, plant-based proteins, whole-grain carbohydrates was discussed in detail with the patient.   A list for source of those nutrients were also provided to the patient.  Patient will use only water or unsweetened tea for hydration. B.  The need to stay away from risky substances including alcohol, smoking; obtaining 7 to 9 hours of restorative sleep, at least 150 minutes of moderate intensity exercise weekly, the importance of healthy social connections,  and stress management techniques were discussed. C.  A full color page of  Calorie density of various food groups per pound showing examples of each food groups was provided to the patient.   He is advised to continue close follow-up with his PMD for primary care needs.   I spent 22 minutes in the care of the patient today  including review of labs from Thyroid Function, CMP, and other relevant labs ; imaging/biopsy records (current and previous including abstractions from other facilities); face-to-face time discussing  his lab results and symptoms, medications doses, his options of short and long term treatment based on the latest standards of care / guidelines;   and documenting the encounter.  [name]  participated in the discussions, expressed understanding, and voiced agreement with the above plans.  All questions were answered  to his satisfaction. he is encouraged to contact clinic should he have any questions or concerns prior to his return visit.    Glade Lloyd, MD Phone: [phone]  Fax: [phone]  -  This note was partially dictated with voice recognition software. Similar sounding words can be transcribed inadequately or may not  be corrected upon review.  05/21/2022, 7:10 PM

## 2022-05-23 LAB — CBC WITH DIFFERENTIAL/PLATELET
Basophils Absolute: 0.1 10*3/uL (ref 0.0–0.2)
Basos: 1 %
EOS (ABSOLUTE): 0.1 10*3/uL (ref 0.0–0.4)
Eos: 2 %
Hematocrit: 48.9 % (ref 37.5–51.0)
Hemoglobin: 16.9 g/dL (ref 13.0–17.7)
Immature Grans (Abs): 0 10*3/uL (ref 0.0–0.1)
Immature Granulocytes: 0 %
Lymphocytes Absolute: 3.1 10*3/uL (ref 0.7–3.1)
Lymphs: 40 %
MCH: 30.7 pg (ref 26.6–33.0)
MCHC: 34.6 g/dL (ref 31.5–35.7)
MCV: 89 fL (ref 79–97)
Monocytes Absolute: 0.8 10*3/uL (ref 0.1–0.9)
Monocytes: 10 %
Neutrophils Absolute: 3.7 10*3/uL (ref 1.4–7.0)
Neutrophils: 47 %
Platelets: 250 10*3/uL (ref 150–450)
RBC: 5.51 x10E6/uL (ref 4.14–5.80)
RDW: 12.4 % (ref 11.6–15.4)
WBC: 7.8 10*3/uL (ref 3.4–10.8)

## 2022-05-23 LAB — COMPREHENSIVE METABOLIC PANEL
ALT: 22 IU/L (ref 0–44)
AST: 18 IU/L (ref 0–40)
Albumin/Globulin Ratio: 2.1 (ref 1.2–2.2)
Albumin: 4.9 g/dL (ref 3.8–4.9)
Alkaline Phosphatase: 65 IU/L (ref 44–121)
BUN/Creatinine Ratio: 13 (ref 9–20)
BUN: 12 mg/dL (ref 6–24)
Bilirubin Total: 0.4 mg/dL (ref 0.0–1.2)
CO2: 23 mmol/L (ref 20–29)
Calcium: 9.4 mg/dL (ref 8.7–10.2)
Chloride: 98 mmol/L (ref 96–106)
Creatinine, Ser: 0.96 mg/dL (ref 0.76–1.27)
Globulin, Total: 2.3 g/dL (ref 1.5–4.5)
Glucose: 84 mg/dL (ref 70–99)
Potassium: 4.2 mmol/L (ref 3.5–5.2)
Sodium: 138 mmol/L (ref 134–144)
Total Protein: 7.2 g/dL (ref 6.0–8.5)
eGFR: 96 mL/min/{1.73_m2} (ref >59)

## 2022-05-23 LAB — TESTOSTERONE, FREE, TOTAL, SHBG
Sex Hormone Binding: 21.8 nmol/L (ref 19.3–76.4)
Testosterone, Free: 3.7 pg/mL — ABNORMAL LOW (ref 7.2–24.0)
Testosterone: 200 ng/dL — ABNORMAL LOW (ref 264–916)

## 2022-06-25 ENCOUNTER — Other Ambulatory Visit (HOSPITAL_COMMUNITY): Payer: Self-pay | Admitting: Psychiatry

## 2022-06-25 DIAGNOSIS — F4010 Social phobia, unspecified: Secondary | ICD-10-CM

## 2022-07-03 ENCOUNTER — Encounter (HOSPITAL_COMMUNITY): Payer: Self-pay | Admitting: Psychiatry

## 2022-07-03 ENCOUNTER — Telehealth (HOSPITAL_BASED_OUTPATIENT_CLINIC_OR_DEPARTMENT_OTHER): Payer: 59 | Admitting: Psychiatry

## 2022-07-03 VITALS — Wt 261.0 lb

## 2022-07-03 DIAGNOSIS — F410 Panic disorder [episodic paroxysmal anxiety] without agoraphobia: Secondary | ICD-10-CM

## 2022-07-03 DIAGNOSIS — F331 Major depressive disorder, recurrent, moderate: Secondary | ICD-10-CM | POA: Diagnosis not present

## 2022-07-03 DIAGNOSIS — F4010 Social phobia, unspecified: Secondary | ICD-10-CM

## 2022-07-03 MED ORDER — LORAZEPAM 0.5 MG PO TABS: ORAL_TABLET | ORAL | 2 refills | Status: DC

## 2022-07-03 MED ORDER — CLOMIPRAMINE HCL 75 MG PO CAPS: 75.0000 mg | ORAL_CAPSULE | Freq: Every day | ORAL | 1 refills | Status: DC

## 2022-07-03 MED ORDER — TRAZODONE HCL 100 MG PO TABS
100.0000 mg | ORAL_TABLET | Freq: Every evening | ORAL | 0 refills | Status: DC | PRN
Start: 2022-07-03 — End: 2022-08-15

## 2022-07-03 NOTE — Progress Notes (Signed)
Virtual Visit via Telephone Note  I connected with Paul Parrish on 07/03/22 at  3:00 PM EDT by telephone and verified that I am speaking with the correct person using two identifiers.  Location: Patient: Home Provider: Home Office   I discussed the limitations, risks, security and privacy concerns of performing an evaluation and management service by telephone and the availability of in person appointments. I also discussed with the patient that there may be a patient responsible charge related to this service. The patient expressed understanding and agreed to proceed.   History of Present Illness: Patient is evaluated by phone session.  Lately he noticed feeling tired, sad, depressed and have no energy or motivation to do things.  He is sleeping too much.  He do not recall any stress or issues going on in his life.  His job is going well.  He lives with his wife and his a stepchild and that has no issues.  Denies any crying spells or any feeling of hopelessness or worthlessness.  He denies any panic attack and he has chronic anxiety around people and when he is at crowded places but it is manageable.  His appetite is okay.  His weight is stable.  He is compliant with Anafranil, Ativan, trazodone.  He is using CPAP and takes melatonin at bedtime.  He has no tremor or shakes or any EPS.  Recently he has a blood work and his testosterone level is less than 200.  His CBC and other labs are normal.   Past Psychiatric History:  H/O anxiety and depression. Saw provider at neuropsychiatry in [place] but not happy with the treatment.  Saw Dr. Daron Offer in 2018 and prescribed Prozac, Xanax, Effexor, Celexa, propanolol, Lamictal, Vistaril, Adderall, Wellbutrin,  Cymbalta (increased irritability ), Anafranil and Luvox (did not help). H/O of ECT with no response.  No h/o suicidal attempt or inpatient. At [facility] tried [place], Klonopin but did not work. H/O heavy drinking but claims to be sober.     Psychiatric Specialty Exam: Physical Exam  Review of Systems  Weight 261 lb (118.4 kg).There is no height or weight on file to calculate BMI.  General Appearance: NA  Eye Contact:  NA  Speech:  Slow  Volume:  Decreased  Mood:  Anxious and Dysphoric  Affect:  NA  Thought Process:  Goal Directed  Orientation:  Full (Time, Place, and Person)  Thought Content:  Rumination  Suicidal Thoughts:  No  Homicidal Thoughts:  No  Memory:  Immediate;   Good Recent;   Good Remote;   Good  Judgement:  Fair  Insight:  Present  Psychomotor Activity:  NA  Concentration:  Concentration: Fair and Attention Span: Fair  Recall:  Good  Fund of Knowledge:  Good  Language:  Good  Akathisia:  No  Handed:  Right  AIMS (if indicated):     Assets:  Communication Skills Desire for Improvement Housing Resilience Social Support Transportation  ADL's:  Intact  Cognition:  WNL  Sleep:   too much, using CPAP      Assessment and Plan: We are depressive disorder, recurrent.  Social anxiety disorder.  Panic attacks with  Discussed recent feeling of sadness, fatigue, lack of motivation and decreased energy.  He did not specify any stressors.  I noticed his testosterone level is less than 200.  I recommend should contact his PCP to get more blood work especially vitamin D, TSH and B12.  I also recommend he can try Anafranil 75 mg  increasing the dose from 50 mg to see if that helps his symptoms.  Patient agrees with the plan.  We will continue trazodone 100 mg at bedtime and Ativan 0.5 mg daily.  Recommended to call us back if is any question or any concern.  Follow-up in 6 weeks to see if adjustment of the medication helping him.  Follow Up Instructions:    I discussed the assessment and treatment plan with the patient. The patient was provided an opportunity to ask questions and all were answered. The patient agreed with the plan and demonstrated an understanding of the instructions.   The patient was  advised to call back or seek an in-person evaluation if the symptoms worsen or if the condition fails to improve as anticipated.  I provided 19 minutes of non-face-to-face time during this encounter.   Kathlee Nations, MD

## 2022-07-29 ENCOUNTER — Other Ambulatory Visit: Payer: Self-pay | Admitting: Family Medicine

## 2022-08-01 ENCOUNTER — Encounter: Payer: Self-pay | Admitting: Family Medicine

## 2022-08-13 ENCOUNTER — Other Ambulatory Visit: Payer: 59

## 2022-08-13 ENCOUNTER — Encounter: Payer: Self-pay | Admitting: "Endocrinology

## 2022-08-13 DIAGNOSIS — I10 Essential (primary) hypertension: Secondary | ICD-10-CM

## 2022-08-13 DIAGNOSIS — E782 Mixed hyperlipidemia: Secondary | ICD-10-CM

## 2022-08-13 DIAGNOSIS — R7303 Prediabetes: Secondary | ICD-10-CM

## 2022-08-13 LAB — BAYER DCA HB A1C WAIVED: HB A1C (BAYER DCA - WAIVED): 5.8 % — ABNORMAL HIGH (ref 4.8–5.6)

## 2022-08-14 LAB — CBC WITH DIFFERENTIAL/PLATELET
Basophils Absolute: 0.1 10*3/uL (ref 0.0–0.2)
Basos: 1 %
EOS (ABSOLUTE): 0.1 10*3/uL (ref 0.0–0.4)
Eos: 2 %
Hematocrit: 51.3 % — ABNORMAL HIGH (ref 37.5–51.0)
Hemoglobin: 17.6 g/dL (ref 13.0–17.7)
Immature Grans (Abs): 0 10*3/uL (ref 0.0–0.1)
Immature Granulocytes: 0 %
Lymphocytes Absolute: 2.6 10*3/uL (ref 0.7–3.1)
Lymphs: 38 %
MCH: 29.9 pg (ref 26.6–33.0)
MCHC: 34.3 g/dL (ref 31.5–35.7)
MCV: 87 fL (ref 79–97)
Monocytes Absolute: 0.7 10*3/uL (ref 0.1–0.9)
Monocytes: 9 %
Neutrophils Absolute: 3.5 10*3/uL (ref 1.4–7.0)
Neutrophils: 50 %
Platelets: 281 10*3/uL (ref 150–450)
RBC: 5.88 x10E6/uL — ABNORMAL HIGH (ref 4.14–5.80)
RDW: 13 % (ref 11.6–15.4)
WBC: 6.9 10*3/uL (ref 3.4–10.8)

## 2022-08-14 LAB — LIPID PANEL
Chol/HDL Ratio: 4.2 ratio (ref 0.0–5.0)
Cholesterol, Total: 129 mg/dL (ref 100–199)
HDL: 31 mg/dL — ABNORMAL LOW (ref >39)
LDL Chol Calc (NIH): 62 mg/dL (ref 0–99)
Triglycerides: 217 mg/dL — ABNORMAL HIGH (ref 0–149)
VLDL Cholesterol Cal: 36 mg/dL (ref 5–40)

## 2022-08-15 ENCOUNTER — Telehealth (HOSPITAL_BASED_OUTPATIENT_CLINIC_OR_DEPARTMENT_OTHER): Payer: 59 | Admitting: Psychiatry

## 2022-08-15 ENCOUNTER — Encounter (HOSPITAL_COMMUNITY): Payer: Self-pay | Admitting: Psychiatry

## 2022-08-15 ENCOUNTER — Ambulatory Visit: Payer: 59 | Admitting: Family Medicine

## 2022-08-15 ENCOUNTER — Encounter: Payer: Self-pay | Admitting: Family Medicine

## 2022-08-15 VITALS — BP 131/89 | HR 94 | Temp 97.6°F | Ht 74.0 in | Wt 268.0 lb

## 2022-08-15 DIAGNOSIS — R7303 Prediabetes: Secondary | ICD-10-CM | POA: Diagnosis not present

## 2022-08-15 DIAGNOSIS — E782 Mixed hyperlipidemia: Secondary | ICD-10-CM

## 2022-08-15 DIAGNOSIS — R5383 Other fatigue: Secondary | ICD-10-CM

## 2022-08-15 DIAGNOSIS — F410 Panic disorder [episodic paroxysmal anxiety] without agoraphobia: Secondary | ICD-10-CM

## 2022-08-15 DIAGNOSIS — F331 Major depressive disorder, recurrent, moderate: Secondary | ICD-10-CM | POA: Diagnosis not present

## 2022-08-15 DIAGNOSIS — I10 Essential (primary) hypertension: Secondary | ICD-10-CM | POA: Diagnosis not present

## 2022-08-15 DIAGNOSIS — F4010 Social phobia, unspecified: Secondary | ICD-10-CM | POA: Diagnosis not present

## 2022-08-15 DIAGNOSIS — L409 Psoriasis, unspecified: Secondary | ICD-10-CM

## 2022-08-15 DIAGNOSIS — E781 Pure hyperglyceridemia: Secondary | ICD-10-CM | POA: Diagnosis not present

## 2022-08-15 MED ORDER — CLOMIPRAMINE HCL 25 MG PO CAPS: 100.0000 mg | ORAL_CAPSULE | Freq: Every day | ORAL | 1 refills | Status: DC

## 2022-08-15 MED ORDER — TRAZODONE HCL 100 MG PO TABS: 100.0000 mg | ORAL_TABLET | Freq: Every evening | ORAL | 0 refills | Status: DC | PRN

## 2022-08-15 MED ORDER — LORAZEPAM 0.5 MG PO TABS: ORAL_TABLET | ORAL | 1 refills | Status: DC

## 2022-08-15 NOTE — Progress Notes (Signed)
BP 131/89   Pulse 94   Temp 97.6 F (36.4 C)   Ht '6\' 2"'$  (1.88 m)   Wt 268 lb (121.6 kg)   SpO2 98%   BMI 34.41 kg/m    Subjective:   Patient ID: Paul Parrish, male    DOB: Jul 22, 1971, 51 y.o.   MRN: [id number]  HPI: Paul Parrish is a 51 y.o. male presenting on 08/15/2022 for Medical Management of Chronic Issues and Diabetes   HPI Hypertension Patient is currently on metoprolol, and their blood pressure today is 131/89. Patient denies any lightheadedness or dizziness. Patient denies headaches, blurred vision, chest pains, shortness of breath, or weakness. Denies any side effects from medication and is content with current medication.   Hyperlipidemia Patient is coming in for recheck of his hyperlipidemia. The patient is currently taking Crestor. They deny any issues with myalgias or history of liver damage from it. They deny any focal numbness or weakness or chest pain.   Prediabetes Patient comes in today for recheck of his diabetes. Patient has been currently taking metformin. Patient is not currently on an ACE inhibitor/ARB. Patient has not seen an ophthalmologist this year. Patient denies any issues with their feet. The symptom started onset as an adult hypertension and hyperlipidemia ARE RELATED TO DM   Patient has psoriasis and takes [place] and his dermatology retired so we are going to get him a new referral to a new one.  Patient has been having low energy and fatigue recent weight he sees psychiatry and they think he is stable as far as psychiatry staff comes but wanted him to be tested for some different things including thyroid B12 vitamin D and testosterone.  He does see an endocrinologist for testosterone already but wants to have it checked with his blood work here.  He also has a sleep apnea machine and he uses it consistently and feels like it is working well although he says its been clinically years since he has had it reevaluated.  Relevant past medical, surgical,  family and social history reviewed and updated as indicated. Interim medical history since our last visit reviewed. Allergies and medications reviewed and updated.  Review of Systems  Constitutional:  Negative for chills and fever.  Eyes:  Negative for visual disturbance.  Respiratory:  Negative for shortness of breath and wheezing.   Cardiovascular:  Negative for chest pain and leg swelling.  Musculoskeletal:  Negative for back pain and gait problem.  Skin:  Negative for rash.  Neurological:  Negative for dizziness, weakness and light-headedness.  All other systems reviewed and are negative.   Per HPI unless specifically indicated above   Allergies as of 08/15/2022       Reactions   Amoxicillin Other (See Comments)   High fever and possible heart racing   Bactrim [sulfamethoxazole-trimethoprim] Other (See Comments)   Fever, body aches, chills   Penicillins Other (See Comments)   High fever Has patient had a PCN reaction causing immediate rash, facial/tongue/throat swelling, SOB or lightheadedness with hypotension: No Has patient had a PCN reaction causing severe rash involving mucus membranes or skin necrosis: No Has patient had a PCN reaction that required hospitalization No Has patient had a PCN reaction occurring within the last 10 years: No If all of the above answers are "NO", then may proceed with Cephalosporin use.        Medication List        Accurate as of August 15, 2022  4:21 PM. If you  have any questions, ask your nurse or doctor.          STOP taking these medications    oxyCODONE 5 MG immediate release tablet Commonly known as: Oxy IR/ROXICODONE Stopped by: Fransisca Kaufmann Fayez Sturgell, MD       TAKE these medications    clobetasol ointment 0.05 % Commonly known as: TEMOVATE Apply 1 application topically 2 (two) times daily.   clomiPRAMINE 75 MG capsule Commonly known as: ANAFRANIL Take 1 capsule (75 mg total) by mouth at bedtime. What changed:  Another medication with the same name was changed. Make sure you understand how and when to take each. Changed by: Kathlee Nations, MD   clomiPRAMINE 25 MG capsule Commonly known as: ANAFRANIL Take 4 capsules (100 mg total) by mouth at bedtime. What changed:  how much to take when to take this Changed by: Kathlee Nations, MD   Fish Oil 1000 MG Caps Take 1 capsule by mouth 2 (two) times daily.   LORazepam 0.5 MG tablet Commonly known as: Ativan Take one tab daily as needed for anxiety and 2nd if needed again   Melatonin 10 MG Tabs Take 1 tablet by mouth at bedtime.   metFORMIN 500 MG tablet Commonly known as: GLUCOPHAGE Take 1 tablet (500 mg total) by mouth 2 (two) times daily with a meal.   metoprolol tartrate 25 MG tablet Commonly known as: LOPRESSOR Take 1 tablet (25 mg total) by mouth 2 (two) times daily.   MULTI-VITAMIN DAILY PO Take 1 capsule by mouth 2 (two) times daily. Name: Life Extensions   rosuvastatin 10 MG tablet Commonly known as: Crestor Take 1 tablet (10 mg total) by mouth daily.   sildenafil 20 MG tablet Commonly known as: REVATIO TAKE 1 TABLET BY MOUTH ONCE DAILY AS NEEDED   Skyrizi 150 MG/ML Sosy Generic drug: Risankizumab-rzaa INJECT '150MG'$  SUBCUTANEOUSLY EVERY 12 WEEKS AS DIRECTED.   Skyrizi Pen 150 MG/ML Soaj Generic drug: Risankizumab-rzaa Inject 150 mg into the skin as directed. Every 12 weeks for maintenance.   SYRINGE-NEEDLE (DISP) 3 ML 21G X 1-1/2" 3 ML Misc Use to inject testosterone every week   Testosterone Cypionate 200 MG/ML Soln Inject 100 mg as directed every 14 (fourteen) days.   traZODone 100 MG tablet Commonly known as: DESYREL Take 1 tablet (100 mg total) by mouth at bedtime as needed for sleep.         Objective:   BP 131/89   Pulse 94   Temp 97.6 F (36.4 C)   Ht '6\' 2"'$  (1.88 m)   Wt 268 lb (121.6 kg)   SpO2 98%   BMI 34.41 kg/m   Wt Readings from Last 3 Encounters:  08/15/22 268 lb (121.6 kg)  05/21/22 269  lb 3.2 oz (122.1 kg)  02/11/22 261 lb (118.4 kg)    Physical Exam Vitals and nursing note reviewed.  Constitutional:      General: He is not in acute distress.    Appearance: He is well-developed. He is not diaphoretic.  Eyes:     General: No scleral icterus.    Conjunctiva/sclera: Conjunctivae normal.  Neck:     Thyroid: No thyromegaly.  Cardiovascular:     Rate and Rhythm: Normal rate and regular rhythm.     Heart sounds: Normal heart sounds. No murmur heard. Pulmonary:     Effort: Pulmonary effort is normal. No respiratory distress.     Breath sounds: Normal breath sounds. No wheezing.  Musculoskeletal:  General: No swelling. Normal range of motion.     Cervical back: Neck supple.  Lymphadenopathy:     Cervical: No cervical adenopathy.  Skin:    General: Skin is warm and dry.     Findings: No rash.  Neurological:     Mental Status: He is alert and oriented to person, place, and time.     Coordination: Coordination normal.  Psychiatric:        Behavior: Behavior normal.     Results for orders placed or performed in visit on 08/13/22  CBC with Differential/Platelet  Result Value Ref Range   WBC 6.9 3.4 - 10.8 x10E3/uL   RBC 5.88 (H) 4.14 - 5.80 x10E6/uL   Hemoglobin 17.6 13.0 - 17.7 g/dL   Hematocrit 51.3 (H) 37.5 - 51.0 %   MCV 87 79 - 97 fL   MCH 29.9 26.6 - 33.0 pg   MCHC 34.3 31.5 - 35.7 g/dL   RDW 13.0 11.6 - 15.4 %   Platelets 281 150 - 450 x10E3/uL   Neutrophils 50 Not Estab. %   Lymphs 38 Not Estab. %   Monocytes 9 Not Estab. %   Eos 2 Not Estab. %   Basos 1 Not Estab. %   Neutrophils Absolute 3.5 1.4 - 7.0 x10E3/uL   Lymphocytes Absolute 2.6 0.7 - 3.1 x10E3/uL   Monocytes Absolute 0.7 0.1 - 0.9 x10E3/uL   EOS (ABSOLUTE) 0.1 0.0 - 0.4 x10E3/uL   Basophils Absolute 0.1 0.0 - 0.2 x10E3/uL   Immature Granulocytes 0 Not Estab. %   Immature Grans (Abs) 0.0 0.0 - 0.1 x10E3/uL  Lipid panel  Result Value Ref Range   Cholesterol, Total 129 100 - 199  mg/dL   Triglycerides 217 (H) 0 - 149 mg/dL   HDL 31 (L) >39 mg/dL   VLDL Cholesterol Cal 36 5 - 40 mg/dL   LDL Chol Calc (NIH) 62 0 - 99 mg/dL   Chol/HDL Ratio 4.2 0.0 - 5.0 ratio  Bayer DCA Hb A1c Waived  Result Value Ref Range   HB A1C (BAYER DCA - WAIVED) 5.8 (H) 4.8 - 5.6 %    Assessment & Plan:   Problem List Items Addressed This Visit       Cardiovascular and Mediastinum   Hypertension - Primary     Musculoskeletal and Integument   Psoriasis   Relevant Orders   Ambulatory referral to Dermatology     Other   Hypertriglyceridemia   Prediabetes   Hyperlipidemia   Other Visit Diagnoses     Other fatigue       Relevant Orders   TSH   Vitamin B12   VITAMIN D 25 Hydroxy (Vit-D Deficiency, Fractures)   Testosterone,Free and Total       Continue current medicine, no changes, will check some things recommended by psychiatrist because of fatigue. Follow up plan: Return in about 6 months (around 02/13/2023), or if symptoms worsen or fail to improve, for Physical exam and hypertension and cholesterol prediabetes.  Counseling provided for all of the vaccine components Orders Placed This Encounter  Procedures   TSH   Vitamin B12   VITAMIN D 25 Hydroxy (Vit-D Deficiency, Fractures)   Testosterone,Free and Total   Ambulatory referral to Dermatology    [name], MD [facility] 08/15/2022, 4:21 PM

## 2022-08-15 NOTE — Progress Notes (Signed)
Virtual Visit via Telephone Note  I connected with Paul Parrish on 08/15/22 at 11:00 AM EST by telephone and verified that I am speaking with the correct person using two identifiers.  Location: Patient: Home Provider: Home Office   I discussed the limitations, risks, security and privacy concerns of performing an evaluation and management service by telephone and the availability of in person appointments. I also discussed with the patient that there may be a patient responsible charge related to this service. The patient expressed understanding and agreed to proceed.   History of Present Illness: Patient is evaluated by phone session.  On the last visit we increased Paul Parrish from 50 mg to 75 mg.  He noticed some improvement in his energy, depression and anxiety but is still sometimes unmotivated to do things.  He like to go back on Adderall because sometimes a notice difficulty completing his task attention and concentration.  Patient told he had the diagnosis of ADHD in the past and given Adderall but we do not have any records.  We also recommend to have his blood work with B01, folic acid, TSH and testosterone level.  His last testosterone level was low and his physician reluctant to increase the dose because of high RBC.  He is using CPAP which is helping his sleep.  Denies any anger, mania, hallucination.  He takes Ativan that helps his anxiety.  He denies any major panic attack but he has chronic anxiety around people.  He feels anxious at crowded places.  He liked the Paul Parrish and reported no tremors, shakes or any EPS.  His appetite is okay.  Denies any feeling of hopelessness or worthlessness.  Denies any suicidal thoughts.     Past Psychiatric History:  H/O anxiety and depression. Saw provider at neuropsychiatry in [place] but not happy with the treatment.  Saw Dr. Daron Offer in 2018 and prescribed Prozac, Xanax, Effexor, Celexa, propanolol, Lamictal, Vistaril, Adderall, Wellbutrin,   Cymbalta (increased irritability ), Paul Parrish and Luvox (did not help). H/O of ECT with no response.  No h/o suicidal attempt or inpatient. At [facility] tried [place], Klonopin but did not work. H/O heavy drinking but claims to be sober.    Psychiatric Specialty Exam: Physical Exam  Review of Systems  Weight 261 lb (118.4 kg).There is no height or weight on file to calculate BMI.  General Appearance: NA  Eye Contact:  NA  Speech:  Normal Rate  Volume:  Normal  Mood:  Euthymic  Affect:  NA  Thought Process:  Goal Directed  Orientation:  Full (Time, Place, and Person)  Thought Content:  Rumination  Suicidal Thoughts:  No  Homicidal Thoughts:  No  Memory:  Immediate;   Good Recent;   Good Remote;   Good  Judgement:  Intact  Insight:  Present  Psychomotor Activity:  NA  Concentration:  Concentration: Fair and Attention Span: Fair  Recall:  Good  Fund of Knowledge:  Good  Language:  Good  Akathisia:  No  Handed:  Right  AIMS (if indicated):     Assets:  Communication Skills Desire for Improvement Housing Resilience Social Support Talents/Skills Transportation  ADL's:  Intact  Cognition:  WNL  Sleep:   ok with CPAP      Assessment and Plan: Major depressive disorder, recurrent.  Social anxiety disorder.  Panic attacks.  Rule out ADHD.  I discussed that we need to have a psychological testing to establish the diagnosis of ADHD.  We also talk about adding a stimulant  may cause worsening of anxiety and panic attack.  I offered Wellbutrin but patient told he had tried in the past that did not work.  I recommend to try Paul Parrish 100 mg since it had helped his mood and anxiety depression.  Patient agreed with the plan.  Patient will wait for his blood work and visit with the PCP to have discussion about testosterone level and blood work.  We will refer him to psychological testing.  Continue Ativan 0.5 mg to take as needed for severe panic attack and second if needed.  Continue  trazodone 100 mg at bedtime.  Recommended to call us back if is any question or any concern.  Follow-up in 2 months.  Follow Up Instructions:    I discussed the assessment and treatment plan with the patient. The patient was provided an opportunity to ask questions and all were answered. The patient agreed with the plan and demonstrated an understanding of the instructions.   The patient was advised to call back or seek an in-person evaluation if the symptoms worsen or if the condition fails to improve as anticipated.  I provided 20 minutes of non-face-to-face time during this encounter.   Kathlee Nations, MD

## 2022-08-16 ENCOUNTER — Other Ambulatory Visit: Payer: 59

## 2022-08-16 ENCOUNTER — Telehealth (HOSPITAL_COMMUNITY): Payer: Self-pay | Admitting: *Deleted

## 2022-08-16 DIAGNOSIS — R5383 Other fatigue: Secondary | ICD-10-CM

## 2022-08-16 NOTE — Telephone Encounter (Signed)
Referrals for ADHD evaluation sent to [facility]. Pt advised of referrals. He verbalizes understanding.

## 2022-08-20 LAB — VITAMIN B12: Vitamin B-12: 483 pg/mL (ref 232–1245)

## 2022-08-20 LAB — TSH: TSH: 3.89 u[IU]/mL (ref 0.450–4.500)

## 2022-08-20 LAB — TESTOSTERONE,FREE AND TOTAL
Testosterone, Free: 5.2 pg/mL — ABNORMAL LOW (ref 7.2–24.0)
Testosterone: 243 ng/dL — ABNORMAL LOW (ref 264–916)

## 2022-08-20 LAB — VITAMIN D 25 HYDROXY (VIT D DEFICIENCY, FRACTURES): Vit D, 25-Hydroxy: 40.7 ng/mL (ref 30.0–100.0)

## 2022-09-03 ENCOUNTER — Other Ambulatory Visit: Payer: Self-pay | Admitting: "Endocrinology

## 2022-09-24 ENCOUNTER — Ambulatory Visit: Payer: 59 | Admitting: "Endocrinology

## 2022-09-27 ENCOUNTER — Other Ambulatory Visit: Payer: 59

## 2022-10-02 ENCOUNTER — Ambulatory Visit: Payer: 59 | Admitting: "Endocrinology

## 2022-10-02 ENCOUNTER — Encounter: Payer: Self-pay | Admitting: "Endocrinology

## 2022-10-02 VITALS — BP 118/78 | HR 84 | Ht 74.0 in | Wt 269.6 lb

## 2022-10-02 DIAGNOSIS — Z6835 Body mass index (BMI) 35.0-35.9, adult: Secondary | ICD-10-CM | POA: Diagnosis not present

## 2022-10-02 DIAGNOSIS — R7303 Prediabetes: Secondary | ICD-10-CM

## 2022-10-02 DIAGNOSIS — E291 Testicular hypofunction: Secondary | ICD-10-CM

## 2022-10-02 NOTE — Progress Notes (Signed)
10/02/2022      Endocrinology follow-up note   HPI: Paul Parrish is a 52 y.o.-year-old man.  - He is being seen in follow-up for  hypogonadism.  He was diagnosed with hypogonadism in 2014, etiology unclear.   -Due to polycythemia, he undergoes phlebotomy periodically.  During his last 2 visits, he was found to have polycythemia which forced discontinuation of the testosterone supplement.  Due to to his total testosterone being at 169 with correction of his CBC profile, he was restarted on testosterone injection 100 mg every 14 days.  He returns with improved plan testosterone at 449,  still favorable CBC and CMP profile.     Patient wishes to continue on testosterone treatment.  He reports improvement in his symptoms including fatigue and low libido.    -He reports history of seizures as a young man until his teenage years. -He denies any history of head injury.  - He also is recently diagnosed with sleep apnea currently on CPAP.  - He fathers 2 teenage boys biologically. - He has significant mood disorder on multiple antidepressants and antianxiety. No herbal medicines. -He denies family history of premature coronary artery disease.   ROS: Limited as above.  PE: BP 118/78   Pulse 84   Ht '6\' 2"'$  (1.88 m)   Wt 269 lb 9.6 oz (122.3 kg)   BMI 34.61 kg/m  Wt Readings from Last 3 Encounters:  10/02/22 269 lb 9.6 oz (122.3 kg)  08/15/22 268 lb (121.6 kg)  05/21/22 269 lb 3.2 oz (122.1 kg)     Recent Results (from the past 2160 hour(s))  Bayer DCA Hb A1c Waived     Status: Abnormal   Collection Time: 08/13/22  9:20 AM  Result Value Ref Range   HB A1C (BAYER DCA - WAIVED) 5.8 (H) 4.8 - 5.6 %    Comment:          Prediabetes: 5.7 - 6.4          Diabetes: >6.4          Glycemic control for adults with diabetes: <7.0   CBC with Differential/Platelet     Status: Abnormal   Collection Time: 08/13/22  9:22 AM  Result Value Ref Range   WBC 6.9 3.4 - 10.8 x10E3/uL   RBC 5.88 (H)  4.14 - 5.80 x10E6/uL   Hemoglobin 17.6 13.0 - 17.7 g/dL   Hematocrit 51.3 (H) 37.5 - 51.0 %   MCV 87 79 - 97 fL   MCH 29.9 26.6 - 33.0 pg   MCHC 34.3 31.5 - 35.7 g/dL   RDW 13.0 11.6 - 15.4 %   Platelets 281 150 - 450 x10E3/uL   Neutrophils 50 Not Estab. %   Lymphs 38 Not Estab. %   Monocytes 9 Not Estab. %   Eos 2 Not Estab. %   Basos 1 Not Estab. %   Neutrophils Absolute 3.5 1.4 - 7.0 x10E3/uL   Lymphocytes Absolute 2.6 0.7 - 3.1 x10E3/uL   Monocytes Absolute 0.7 0.1 - 0.9 x10E3/uL   EOS (ABSOLUTE) 0.1 0.0 - 0.4 x10E3/uL   Basophils Absolute 0.1 0.0 - 0.2 x10E3/uL   Immature Granulocytes 0 Not Estab. %   Immature Grans (Abs) 0.0 0.0 - 0.1 x10E3/uL  Lipid panel     Status: Abnormal   Collection Time: 08/13/22  9:22 AM  Result Value Ref Range   Cholesterol, Total 129 100 - 199 mg/dL   Triglycerides 217 (H) 0 - 149 mg/dL   HDL 31 (  L) >39 mg/dL   VLDL Cholesterol Cal 36 5 - 40 mg/dL   LDL Chol Calc (NIH) 62 0 - 99 mg/dL   Chol/HDL Ratio 4.2 0.0 - 5.0 ratio    Comment:                                   T. Chol/HDL Ratio                                             Men  Women                               1/2 Avg.Risk  3.4    3.3                                   Avg.Risk  5.0    4.4                                2X Avg.Risk  9.6    7.1                                3X Avg.Risk 23.4   11.0   Testosterone,Free and Total     Status: Abnormal   Collection Time: 08/16/22  9:15 AM  Result Value Ref Range   Testosterone 243 (L) 264 - 916 ng/dL    Comment: Adult male reference interval is based on a population of healthy nonobese males (BMI <30) between 64 and 48 years old. [place], [place] [phone]. PMID: [id number].    Testosterone, Free 5.2 (L) 7.2 - 24.0 pg/mL  VITAMIN D 25 Hydroxy (Vit-D Deficiency, Fractures)     Status: None   Collection Time: 08/16/22  9:15 AM  Result Value Ref Range   Vit D, 25-Hydroxy 40.7 30.0 - 100.0 ng/mL    Comment: Vitamin D  deficiency has been defined by the [place] practice guideline as a level of serum 25-OH vitamin D less than 20 ng/mL (1,2). The Endocrine Society went on to further define vitamin D insufficiency as a level between 21 and 29 ng/mL (2). 1. IOM (Institute of Medicine). 2010. Dietary reference    intakes for calcium and D. [place]: The    [employer]. 2. Holick MF, Binkley [place], Bischoff-Ferrari HA, et al.    Evaluation, treatment, and prevention of vitamin D    deficiency: an Endocrine Society clinical practice    guideline. JCEM. 2011 Jul; 96(7):1911-30.   Vitamin B12     Status: None   Collection Time: 08/16/22  9:15 AM  Result Value Ref Range   Vitamin B-12 483 232 - 1,245 pg/mL  TSH     Status: None   Collection Time: 08/16/22  9:15 AM  Result Value Ref Range   TSH 3.890 0.450 - 4.500 uIU/mL  Testosterone, Free, Total, SHBG     Status: None (Preliminary result)   Collection Time: 09/27/22  9:20 AM  Result Value Ref Range   Testosterone 449 264 - 916 ng/dL  Comment: Adult male reference interval is based on a population of healthy nonobese males (BMI <30) between 84 and 80 years old. [place], [place] [phone]. PMID: [id number].    Testosterone, Free WILL FOLLOW    Sex Hormone Binding 23.5 19.3 - 76.4 nmol/L  PSA     Status: None   Collection Time: 09/27/22  9:20 AM  Result Value Ref Range   Prostate Specific Ag, Serum 0.4 0.0 - 4.0 ng/mL    Comment: Roche ECLIA methodology. According to the American Urological Association, Serum PSA should decrease and remain at undetectable levels after radical prostatectomy. The AUA defines biochemical recurrence as an initial PSA value 0.2 ng/mL or greater followed by a subsequent confirmatory PSA value 0.2 ng/mL or greater. Values obtained with different assay methods or kits cannot be used interchangeably. Results cannot be interpreted as absolute evidence of the  presence or absence of malignant disease.   CBC with Differential/Platelet     Status: None   Collection Time: 09/27/22  9:20 AM  Result Value Ref Range   WBC 5.7 3.4 - 10.8 x10E3/uL   RBC 5.71 4.14 - 5.80 x10E6/uL   Hemoglobin 16.8 13.0 - 17.7 g/dL   Hematocrit 50.7 37.5 - 51.0 %   MCV 89 79 - 97 fL   MCH 29.4 26.6 - 33.0 pg   MCHC 33.1 31.5 - 35.7 g/dL   RDW 13.3 11.6 - 15.4 %   Platelets 230 150 - 450 x10E3/uL   Neutrophils 44 Not Estab. %   Lymphs 42 Not Estab. %   Monocytes 11 Not Estab. %   Eos 2 Not Estab. %   Basos 1 Not Estab. %   Neutrophils Absolute 2.5 1.4 - 7.0 x10E3/uL   Lymphocytes Absolute 2.4 0.7 - 3.1 x10E3/uL   Monocytes Absolute 0.6 0.1 - 0.9 x10E3/uL   EOS (ABSOLUTE) 0.1 0.0 - 0.4 x10E3/uL   Basophils Absolute 0.1 0.0 - 0.2 x10E3/uL   Immature Granulocytes 0 Not Estab. %   Immature Grans (Abs) 0.0 0.0 - 0.1 x10E3/uL      ASSESSMENT: 1.  Hypogonadism  2. Erectile Dysfunction 3.  Polycythemia 4.  Prediabetes  PLAN:  1. Hypogonadism  -He did phlebotomy at least once since his last visit.  His recent labs are showing continued favorable CBC profile.  His total testosterone is 449 ng per DL.  He is benefiting from testosterone supplement.     He is advised to continue testosterone  100 mg every 14 days with plan to repeat total testosterone, CBC and CMP in 4 months.    His history of  secondary polycythemia which continues to require periodic phlebotomy, as well as sleep apnea requiring CPAP treatment making him a poor candidate for more generous testosterone replacement. Regarding his weight concern: His insurance did not provide coverage for injectable weight loss medications. He remains a good fit for lifestyle medicine. - he acknowledges that there is a room for improvement in his food and drink choices. - Suggestion is made for him to avoid simple carbohydrates  from his diet including Cakes, Sweet Desserts, Ice Cream, Soda (diet and regular), Sweet  Tea, Candies, Chips, Cookies, Store Bought Juices, Alcohol , Artificial Sweeteners,  Coffee Creamer, and "Sugar-free" Products, Lemonade. This will help patient to have more stable blood glucose profile and potentially avoid unintended weight gain.  The following Lifestyle Medicine recommendations according to [facility]) were discussed and and offered to patient and he  agrees to  start the journey:  A. Whole Foods, Plant-Based Nutrition comprising of fruits and vegetables, plant-based proteins, whole-grain carbohydrates was discussed in detail with the patient.   A list for source of those nutrients were also provided to the patient.  Patient will use only water or unsweetened tea for hydration. B.  The need to stay away from risky substances including alcohol, smoking; obtaining 7 to 9 hours of restorative sleep, at least 150 minutes of moderate intensity exercise weekly, the importance of healthy social connections,  and stress management techniques were discussed. C.  A full color page of  Calorie density of various food groups per pound showing examples of each food groups was provided to the patient.   He is advised to continue close follow-up with his PMD for primary care needs.   I spent 23 minutes in the care of the patient today including review of labs from Thyroid Function, CMP, and other relevant labs ; imaging/biopsy records (current and previous including abstractions from other facilities); face-to-face time discussing  his lab results and symptoms, medications doses, his options of short and long term treatment based on the latest standards of care / guidelines;   and documenting the encounter.  [name]  participated in the discussions, expressed understanding, and voiced agreement with the above plans.  All questions were answered to his satisfaction. he is encouraged to contact clinic should he have any questions or concerns prior to his return  visit.    Glade Lloyd, MD Phone: [phone]  Fax: [phone]  -  This note was partially dictated with voice recognition software. Similar sounding words can be transcribed inadequately or may not  be corrected upon review.  10/02/2022, 5:39 PM

## 2022-10-04 LAB — CBC WITH DIFFERENTIAL/PLATELET
Basophils Absolute: 0.1 10*3/uL (ref 0.0–0.2)
Basos: 1 %
EOS (ABSOLUTE): 0.1 10*3/uL (ref 0.0–0.4)
Eos: 2 %
Hematocrit: 50.7 % (ref 37.5–51.0)
Hemoglobin: 16.8 g/dL (ref 13.0–17.7)
Immature Grans (Abs): 0 10*3/uL (ref 0.0–0.1)
Immature Granulocytes: 0 %
Lymphocytes Absolute: 2.4 10*3/uL (ref 0.7–3.1)
Lymphs: 42 %
MCH: 29.4 pg (ref 26.6–33.0)
MCHC: 33.1 g/dL (ref 31.5–35.7)
MCV: 89 fL (ref 79–97)
Monocytes Absolute: 0.6 10*3/uL (ref 0.1–0.9)
Monocytes: 11 %
Neutrophils Absolute: 2.5 10*3/uL (ref 1.4–7.0)
Neutrophils: 44 %
Platelets: 230 10*3/uL (ref 150–450)
RBC: 5.71 x10E6/uL (ref 4.14–5.80)
RDW: 13.3 % (ref 11.6–15.4)
WBC: 5.7 10*3/uL (ref 3.4–10.8)

## 2022-10-04 LAB — TESTOSTERONE, FREE, TOTAL, SHBG
Sex Hormone Binding: 23.5 nmol/L (ref 19.3–76.4)
Testosterone, Free: 7 pg/mL — ABNORMAL LOW (ref 7.2–24.0)
Testosterone: 449 ng/dL (ref 264–916)

## 2022-10-04 LAB — PSA: Prostate Specific Ag, Serum: 0.4 ng/mL (ref 0.0–4.0)

## 2022-10-11 ENCOUNTER — Other Ambulatory Visit: Payer: Self-pay | Admitting: Cardiovascular Disease

## 2022-10-15 ENCOUNTER — Telehealth (HOSPITAL_BASED_OUTPATIENT_CLINIC_OR_DEPARTMENT_OTHER): Payer: 59 | Admitting: Psychiatry

## 2022-10-15 ENCOUNTER — Encounter (HOSPITAL_COMMUNITY): Payer: Self-pay | Admitting: Psychiatry

## 2022-10-15 VITALS — Wt 269.0 lb

## 2022-10-15 DIAGNOSIS — F410 Panic disorder [episodic paroxysmal anxiety] without agoraphobia: Secondary | ICD-10-CM

## 2022-10-15 DIAGNOSIS — F4010 Social phobia, unspecified: Secondary | ICD-10-CM | POA: Diagnosis not present

## 2022-10-15 DIAGNOSIS — F331 Major depressive disorder, recurrent, moderate: Secondary | ICD-10-CM

## 2022-10-15 MED ORDER — DESVENLAFAXINE SUCCINATE ER 25 MG PO TB24: 25.0000 mg | ORAL_TABLET | Freq: Every day | ORAL | 0 refills | Status: DC

## 2022-10-15 NOTE — Progress Notes (Signed)
Virtual Visit via Telephone Note  I connected with Paul Parrish on 10/15/22 at  4:00 PM EST by telephone and verified that I am speaking with the correct person using two identifiers.  Location: Patient: Home Provider: Home Office   I discussed the limitations, risks, security and privacy concerns of performing an evaluation and management service by telephone and the availability of in person appointments. I also discussed with the patient that there may be a patient responsible charge related to this service. The patient expressed understanding and agreed to proceed.   History of Present Illness: Patient is evaluated by phone session.  We increased Anafranil to 100 mg but he still struggle with anxiety, depression and no motivation to do things.  He has appointment for ADHD evaluation in first week of February.  Recently had a blood work and his testosterone level is much improved.  His RBC also stable.  He is using CPAP.  He denies any suicidal thoughts but reported feeling of hopelessness and worthlessness.  He feels fatigued and tired.  He is taking trazodone and also taking Ativan when he go outside and that helps the anxiety.  He does not like a lot of people around him but Ativan helps to control his anxiety at crowded places.  His appetite is okay.  His weight is stable.  He has not seen a huge improvement with the Anafranil and like to try a different medication.   Past Psychiatric History:  H/O anxiety and depression. Saw provider at neuropsychiatry in [place] but not happy with the treatment.  Saw Dr. Daron Offer in 2018 and prescribed Prozac, Xanax, Effexor, Celexa, propanolol, Lamictal, Vistaril, Adderall, Wellbutrin,  Cymbalta (increased irritability ), Anafranil and Luvox (did not help). H/O of ECT with no response.  No h/o suicidal attempt or inpatient. At [facility] tried [place], Klonopin but did not work. H/O heavy drinking but claims to be sober.    Recent Results (from the  past 2160 hour(s))  Bayer DCA Hb A1c Waived     Status: Abnormal   Collection Time: 08/13/22  9:20 AM  Result Value Ref Range   HB A1C (BAYER DCA - WAIVED) 5.8 (H) 4.8 - 5.6 %    Comment:          Prediabetes: 5.7 - 6.4          Diabetes: >6.4          Glycemic control for adults with diabetes: <7.0   CBC with Differential/Platelet     Status: Abnormal   Collection Time: 08/13/22  9:22 AM  Result Value Ref Range   WBC 6.9 3.4 - 10.8 x10E3/uL   RBC 5.88 (H) 4.14 - 5.80 x10E6/uL   Hemoglobin 17.6 13.0 - 17.7 g/dL   Hematocrit 51.3 (H) 37.5 - 51.0 %   MCV 87 79 - 97 fL   MCH 29.9 26.6 - 33.0 pg   MCHC 34.3 31.5 - 35.7 g/dL   RDW 13.0 11.6 - 15.4 %   Platelets 281 150 - 450 x10E3/uL   Neutrophils 50 Not Estab. %   Lymphs 38 Not Estab. %   Monocytes 9 Not Estab. %   Eos 2 Not Estab. %   Basos 1 Not Estab. %   Neutrophils Absolute 3.5 1.4 - 7.0 x10E3/uL   Lymphocytes Absolute 2.6 0.7 - 3.1 x10E3/uL   Monocytes Absolute 0.7 0.1 - 0.9 x10E3/uL   EOS (ABSOLUTE) 0.1 0.0 - 0.4 x10E3/uL   Basophils Absolute 0.1 0.0 - 0.2 x10E3/uL  Immature Granulocytes 0 Not Estab. %   Immature Grans (Abs) 0.0 0.0 - 0.1 x10E3/uL  Lipid panel     Status: Abnormal   Collection Time: 08/13/22  9:22 AM  Result Value Ref Range   Cholesterol, Total 129 100 - 199 mg/dL   Triglycerides 217 (H) 0 - 149 mg/dL   HDL 31 (L) >39 mg/dL   VLDL Cholesterol Cal 36 5 - 40 mg/dL   LDL Chol Calc (NIH) 62 0 - 99 mg/dL   Chol/HDL Ratio 4.2 0.0 - 5.0 ratio    Comment:                                   T. Chol/HDL Ratio                                             Men  Women                               1/2 Avg.Risk  3.4    3.3                                   Avg.Risk  5.0    4.4                                2X Avg.Risk  9.6    7.1                                3X Avg.Risk 23.4   11.0   Testosterone,Free and Total     Status: Abnormal   Collection Time: 08/16/22  9:15 AM  Result Value Ref Range   Testosterone  243 (L) 264 - 916 ng/dL    Comment: Adult male reference interval is based on a population of healthy nonobese males (BMI <30) between 22 and 25 years old. [place], [place] [phone]. PMID: [id number].    Testosterone, Free 5.2 (L) 7.2 - 24.0 pg/mL  VITAMIN D 25 Hydroxy (Vit-D Deficiency, Fractures)     Status: None   Collection Time: 08/16/22  9:15 AM  Result Value Ref Range   Vit D, 25-Hydroxy 40.7 30.0 - 100.0 ng/mL    Comment: Vitamin D deficiency has been defined by the [place] practice guideline as a level of serum 25-OH vitamin D less than 20 ng/mL (1,2). The Endocrine Society went on to further define vitamin D insufficiency as a level between 21 and 29 ng/mL (2). 1. IOM (Institute of Medicine). 2010. Dietary reference    intakes for calcium and D. [place]: The    [employer]. 2. Holick MF, Binkley [place], Bischoff-Ferrari HA, et al.    Evaluation, treatment, and prevention of vitamin D    deficiency: an Endocrine Society clinical practice    guideline. JCEM. 2011 Jul; 96(7):1911-30.   Vitamin B12     Status: None   Collection Time: 08/16/22  9:15 AM  Result Value Ref Range   Vitamin B-12 483 232 - 1,245 pg/mL  TSH  Status: None   Collection Time: 08/16/22  9:15 AM  Result Value Ref Range   TSH 3.890 0.450 - 4.500 uIU/mL  Testosterone, Free, Total, SHBG     Status: Abnormal   Collection Time: 09/27/22  9:20 AM  Result Value Ref Range   Testosterone 449 264 - 916 ng/dL    Comment: Adult male reference interval is based on a population of healthy nonobese males (BMI <30) between 69 and 60 years old. [place], [place] [phone]. PMID: [id number].    Testosterone, Free 7.0 (L) 7.2 - 24.0 pg/mL   Sex Hormone Binding 23.5 19.3 - 76.4 nmol/L  PSA     Status: None   Collection Time: 09/27/22  9:20 AM  Result Value Ref Range   Prostate Specific Ag, Serum 0.4 0.0 - 4.0 ng/mL    Comment: Roche  ECLIA methodology. According to the American Urological Association, Serum PSA should decrease and remain at undetectable levels after radical prostatectomy. The AUA defines biochemical recurrence as an initial PSA value 0.2 ng/mL or greater followed by a subsequent confirmatory PSA value 0.2 ng/mL or greater. Values obtained with different assay methods or kits cannot be used interchangeably. Results cannot be interpreted as absolute evidence of the presence or absence of malignant disease.   CBC with Differential/Platelet     Status: None   Collection Time: 09/27/22  9:20 AM  Result Value Ref Range   WBC 5.7 3.4 - 10.8 x10E3/uL   RBC 5.71 4.14 - 5.80 x10E6/uL   Hemoglobin 16.8 13.0 - 17.7 g/dL   Hematocrit 50.7 37.5 - 51.0 %   MCV 89 79 - 97 fL   MCH 29.4 26.6 - 33.0 pg   MCHC 33.1 31.5 - 35.7 g/dL   RDW 13.3 11.6 - 15.4 %   Platelets 230 150 - 450 x10E3/uL   Neutrophils 44 Not Estab. %   Lymphs 42 Not Estab. %   Monocytes 11 Not Estab. %   Eos 2 Not Estab. %   Basos 1 Not Estab. %   Neutrophils Absolute 2.5 1.4 - 7.0 x10E3/uL   Lymphocytes Absolute 2.4 0.7 - 3.1 x10E3/uL   Monocytes Absolute 0.6 0.1 - 0.9 x10E3/uL   EOS (ABSOLUTE) 0.1 0.0 - 0.4 x10E3/uL   Basophils Absolute 0.1 0.0 - 0.2 x10E3/uL   Immature Granulocytes 0 Not Estab. %   Immature Grans (Abs) 0.0 0.0 - 0.1 x10E3/uL     Psychiatric Specialty Exam: Physical Exam  Review of Systems  Weight 269 lb (122 kg).There is no height or weight on file to calculate BMI.  General Appearance: Casual  Eye Contact:  NA  Speech:  Slow  Volume:  Decreased  Mood:  Depressed and Dysphoric  Affect:  NA  Thought Process:  Goal Directed  Orientation:  Full (Time, Place, and Person)  Thought Content:  Rumination  Suicidal Thoughts:  No  Homicidal Thoughts:  No  Memory:  Immediate;   Good Recent;   Good Remote;   Good  Judgement:  Intact  Insight:  Present  Psychomotor Activity:  NA  Concentration:  Concentration: Good  and Attention Span: Good  Recall:  Good  Fund of Knowledge:  Good  Language:  Good  Akathisia:  No  Handed:  Right  AIMS (if indicated):     Assets:  Communication Skills Desire for Improvement Housing Resilience Social Support Transportation  ADL's:  Intact  Cognition:  WNL  Sleep:   ok with CPAP      Assessment and Plan: Major  depressive disorder, recurrent.  Social anxiety disorder.  Panic attacks.  Despite increasing the Anafranil he did not see any improvement.  He is taking 100 mg Anafranil.  In the past he had tried Effexor, Celexa, propranolol, Lamictal, Luvox, Viibryd.  He had genetic enzymes testing in 2019.  I reviewed the results and Pristiq is one of the medication that he can take without any interaction.  I discuss trial of Pristiq and he is willing to try.  I explained in the meantime we will get the psychological testing for ADHD.  Recommend to cut down his Anafranil 50 mg only and try Pristiq 25 mg daily.  Continue Ativan 0.5 mg to take as needed for severe panic attack and trazodone 100 mg at bedtime.  Patient has enough medication until he will follow-up in 2 weeks.  I recommended to call us back if is any question or any concern.  I reviewed blood work results.  His testosterone level is improved.  His RBC is also stable.    Follow Up Instructions:    I discussed the assessment and treatment plan with the patient. The patient was provided an opportunity to ask questions and all were answered. The patient agreed with the plan and demonstrated an understanding of the instructions.   The patient was advised to call back or seek an in-person evaluation if the symptoms worsen or if the condition fails to improve as anticipated.  Collaboration of Care: Other provider involved in patient's care AEB notes are available in epic to review.  Patient/Guardian was advised Release of Information must be obtained prior to any record release in order to collaborate their care  with an outside provider. Patient/Guardian was advised if they have not already done so to contact the registration department to sign all necessary forms in order for [place] to release information regarding their care.   Consent: Patient/Guardian gives verbal consent for treatment and assignment of benefits for services provided during this visit. Patient/Guardian expressed understanding and agreed to proceed.    I provided 22 minutes of non-face-to-face time during this encounter.   Kathlee Nations, MD

## 2022-10-31 ENCOUNTER — Other Ambulatory Visit (HOSPITAL_COMMUNITY): Payer: Self-pay | Admitting: *Deleted

## 2022-10-31 DIAGNOSIS — F331 Major depressive disorder, recurrent, moderate: Secondary | ICD-10-CM

## 2022-10-31 DIAGNOSIS — F4010 Social phobia, unspecified: Secondary | ICD-10-CM

## 2022-10-31 DIAGNOSIS — F410 Panic disorder [episodic paroxysmal anxiety] without agoraphobia: Secondary | ICD-10-CM

## 2022-10-31 MED ORDER — DESVENLAFAXINE SUCCINATE ER 25 MG PO TB24: 25.0000 mg | ORAL_TABLET | Freq: Every day | ORAL | 0 refills | Status: DC

## 2022-10-31 MED ORDER — LORAZEPAM 0.5 MG PO TABS: ORAL_TABLET | ORAL | 0 refills | Status: DC

## 2022-11-04 ENCOUNTER — Other Ambulatory Visit: Payer: Self-pay | Admitting: Family Medicine

## 2022-11-04 ENCOUNTER — Other Ambulatory Visit: Payer: Self-pay | Admitting: Cardiovascular Disease

## 2022-11-04 NOTE — Telephone Encounter (Signed)
Last office visit 08/15/22 Upcoming appointment 02/11/22 Last refill 07/29/22, #60, 1 refill

## 2022-11-07 ENCOUNTER — Encounter: Payer: Self-pay | Admitting: *Deleted

## 2022-11-13 ENCOUNTER — Telehealth (HOSPITAL_COMMUNITY): Payer: 59 | Admitting: Psychiatry

## 2022-11-14 ENCOUNTER — Other Ambulatory Visit (HOSPITAL_COMMUNITY): Payer: Self-pay | Admitting: Psychiatry

## 2022-11-14 DIAGNOSIS — F331 Major depressive disorder, recurrent, moderate: Secondary | ICD-10-CM

## 2022-11-18 ENCOUNTER — Other Ambulatory Visit (HOSPITAL_COMMUNITY): Payer: Self-pay | Admitting: *Deleted

## 2022-11-18 DIAGNOSIS — F4010 Social phobia, unspecified: Secondary | ICD-10-CM

## 2022-11-18 DIAGNOSIS — F331 Major depressive disorder, recurrent, moderate: Secondary | ICD-10-CM

## 2022-11-18 DIAGNOSIS — F410 Panic disorder [episodic paroxysmal anxiety] without agoraphobia: Secondary | ICD-10-CM

## 2022-11-18 MED ORDER — CLOMIPRAMINE HCL 25 MG PO CAPS: 100.0000 mg | ORAL_CAPSULE | Freq: Every day | ORAL | 0 refills | Status: DC

## 2022-11-24 ENCOUNTER — Other Ambulatory Visit: Payer: Self-pay | Admitting: Family Medicine

## 2022-11-24 DIAGNOSIS — E6609 Other obesity due to excess calories: Secondary | ICD-10-CM

## 2022-11-24 DIAGNOSIS — R7303 Prediabetes: Secondary | ICD-10-CM

## 2022-11-26 ENCOUNTER — Other Ambulatory Visit: Payer: Self-pay | Admitting: Cardiovascular Disease

## 2022-11-28 ENCOUNTER — Other Ambulatory Visit: Payer: Self-pay | Admitting: "Endocrinology

## 2022-12-03 ENCOUNTER — Encounter (HOSPITAL_COMMUNITY): Payer: Self-pay | Admitting: Psychiatry

## 2022-12-03 ENCOUNTER — Telehealth (HOSPITAL_BASED_OUTPATIENT_CLINIC_OR_DEPARTMENT_OTHER): Payer: 59 | Admitting: Psychiatry

## 2022-12-03 DIAGNOSIS — F4010 Social phobia, unspecified: Secondary | ICD-10-CM | POA: Diagnosis not present

## 2022-12-03 DIAGNOSIS — F331 Major depressive disorder, recurrent, moderate: Secondary | ICD-10-CM | POA: Diagnosis not present

## 2022-12-03 DIAGNOSIS — F410 Panic disorder [episodic paroxysmal anxiety] without agoraphobia: Secondary | ICD-10-CM

## 2022-12-03 MED ORDER — TRAZODONE HCL 100 MG PO TABS: 100.0000 mg | ORAL_TABLET | Freq: Every evening | ORAL | 0 refills | Status: DC | PRN

## 2022-12-03 MED ORDER — LORAZEPAM 0.5 MG PO TABS: ORAL_TABLET | ORAL | 1 refills | Status: DC

## 2022-12-03 MED ORDER — DESVENLAFAXINE SUCCINATE ER 50 MG PO TB24: 50.0000 mg | ORAL_TABLET | Freq: Every day | ORAL | 1 refills | Status: DC

## 2022-12-03 NOTE — Progress Notes (Signed)
[facility] MD Virtual Progress Note   Patient Location: Home Provider Location: Home   I connect with patient by telephone and verified that I am speaking with correct person by using two identifiers. I discussed the limitations of evaluation and management by telemedicine and the availability of in person appointments. I also discussed with the patient that there may be a patient responsible charge related to this service. The patient expressed understanding and agreed to proceed.  Paul Parrish [id number] 51 y.o.  12/03/2022 2:58 PM    History of Present Illness:  Patient is evaluated by phone session.  On the last visit we cut down the Anafranil and started him to try Pristiq.  He is taking 25 mg daily and taking 50 mg of Anafranil.  He used to take 100 mg but noticed no improvement.  He noticed slight improvement with Pristiq.  He is more active, motivated with increased energy level.  He lost some weight as he noticed not as tired and doing things which he has not done in the past.  Still struggle with anxiety with people around him but Ativan helps.  He does not like crowded places.  He is tolerating Pristiq very well and denies any tremors, shakes or any EPS.  He works from home.  He takes trazodone that helps his sleep.  Denies any hallucination, paranoia, suicidal thoughts.  Past Psychiatric History: H/O anxiety and depression. Saw provider at neuropsychiatry in [place] but not happy with the treatment.  Saw Dr. Daron Offer in 2018 and prescribed Prozac, Xanax, Effexor, Celexa, propanolol, Lamictal, Vistaril, Adderall, Wellbutrin,  Cymbalta (increased irritability ), Anafranil and Luvox (did not help). H/O of ECT with no response.  No h/o suicidal attempt or inpatient. At [facility] tried [place], Klonopin but did not work. H/O heavy drinking but claims to be sober.     Outpatient Encounter Medications as of 12/03/2022  Medication Sig   clobetasol ointment (TEMOVATE) [id number]  % Apply 1 application topically 2 (two) times daily.   clomiPRAMINE (ANAFRANIL) 25 MG capsule Take 4 capsules (100 mg total) by mouth at bedtime.   clomiPRAMINE (ANAFRANIL) 75 MG capsule Take 1 capsule (75 mg total) by mouth at bedtime.   desvenlafaxine (PRISTIQ) 25 MG 24 hr tablet Take 1 tablet (25 mg total) by mouth daily. bridge   LORazepam (ATIVAN) 0.5 MG tablet Take one tab daily as needed for anxiety and 2nd if needed again   Melatonin 10 MG TABS Take 1 tablet by mouth at bedtime.   metFORMIN (GLUCOPHAGE) 500 MG tablet TAKE 1 TABLET BY MOUTH TWICE DAILY WITH A MEAL   metoprolol tartrate (LOPRESSOR) 25 MG tablet Take 1 tablet (25 mg total) by mouth 2 (two) times daily.   Multiple Vitamin (MULTI-VITAMIN DAILY PO) Take 1 capsule by mouth 2 (two) times daily. Name: Life Extensions   Omega-3 Fatty Acids (FISH OIL) 1000 MG CAPS Take 1 capsule by mouth 2 (two) times daily.   Risankizumab-rzaa (SKYRIZI PEN) 150 MG/ML SOAJ Inject 150 mg into the skin as directed. Every 12 weeks for maintenance.   rosuvastatin (CRESTOR) 10 MG tablet Take 1 tablet (10 mg total) by mouth daily.   sildenafil (REVATIO) 20 MG tablet TAKE 1 TABLET BY MOUTH ONCE DAILY AS NEEDED   SKYRIZI 150 MG/ML SOSY INJECT '150MG'$  SUBCUTANEOUSLY EVERY 12 WEEKS AS DIRECTED.   SYRINGE-NEEDLE, DISP, 3 ML 21G X 1-1/2" 3 ML MISC Use to inject testosterone every week   testosterone cypionate (DEPOTESTOSTERONE CYPIONATE) 200 MG/ML injection Inject  0.5 mLs (100 mg total) into the muscle every 14 (fourteen) days.   traZODone (DESYREL) 100 MG tablet Take 1 tablet (100 mg total) by mouth at bedtime as needed for sleep.   No facility-administered encounter medications on file as of 12/03/2022.    Recent Results (from the past 2160 hour(s))  Testosterone, Free, Total, SHBG     Status: Abnormal   Collection Time: 09/27/22  9:20 AM  Result Value Ref Range   Testosterone 449 264 - 916 ng/dL    Comment: Adult male reference interval is based on a  population of healthy nonobese males (BMI <30) between 68 and 42 years old. [place], [place] [phone]. PMID: [id number].    Testosterone, Free 7.0 (L) 7.2 - 24.0 pg/mL   Sex Hormone Binding 23.5 19.3 - 76.4 nmol/L  PSA     Status: None   Collection Time: 09/27/22  9:20 AM  Result Value Ref Range   Prostate Specific Ag, Serum 0.4 0.0 - 4.0 ng/mL    Comment: Roche ECLIA methodology. According to the American Urological Association, Serum PSA should decrease and remain at undetectable levels after radical prostatectomy. The AUA defines biochemical recurrence as an initial PSA value 0.2 ng/mL or greater followed by a subsequent confirmatory PSA value 0.2 ng/mL or greater. Values obtained with different assay methods or kits cannot be used interchangeably. Results cannot be interpreted as absolute evidence of the presence or absence of malignant disease.   CBC with Differential/Platelet     Status: None   Collection Time: 09/27/22  9:20 AM  Result Value Ref Range   WBC 5.7 3.4 - 10.8 x10E3/uL   RBC 5.71 4.14 - 5.80 x10E6/uL   Hemoglobin 16.8 13.0 - 17.7 g/dL   Hematocrit 50.7 37.5 - 51.0 %   MCV 89 79 - 97 fL   MCH 29.4 26.6 - 33.0 pg   MCHC 33.1 31.5 - 35.7 g/dL   RDW 13.3 11.6 - 15.4 %   Platelets 230 150 - 450 x10E3/uL   Neutrophils 44 Not Estab. %   Lymphs 42 Not Estab. %   Monocytes 11 Not Estab. %   Eos 2 Not Estab. %   Basos 1 Not Estab. %   Neutrophils Absolute 2.5 1.4 - 7.0 x10E3/uL   Lymphocytes Absolute 2.4 0.7 - 3.1 x10E3/uL   Monocytes Absolute 0.6 0.1 - 0.9 x10E3/uL   EOS (ABSOLUTE) 0.1 0.0 - 0.4 x10E3/uL   Basophils Absolute 0.1 0.0 - 0.2 x10E3/uL   Immature Granulocytes 0 Not Estab. %   Immature Grans (Abs) 0.0 0.0 - 0.1 x10E3/uL     Psychiatric Specialty Exam: Physical Exam  Review of Systems  Weight 260 lb (117.9 kg).There is no height or weight on file to calculate BMI.  General Appearance: NA  Eye Contact:  NA  Speech:  Clear and  Coherent and Normal Rate  Volume:  Normal  Mood:  Dysphoric  Affect:  NA  Thought Process:  Goal Directed  Orientation:  Full (Time, Place, and Person)  Thought Content:  Rumination  Suicidal Thoughts:  No  Homicidal Thoughts:  No  Memory:  Immediate;   Good Recent;   Good Remote;   Good  Judgement:  Good  Insight:  Present  Psychomotor Activity:  NA  Concentration:  Concentration: Good and Attention Span: Good  Recall:  Good  Fund of Knowledge:  Good  Language:  Good  Akathisia:  No  Handed:  Right  AIMS (if indicated):     Assets:  Communication Skills Desire for Improvement Financial Resources/Insurance Housing Resilience Social Support Talents/Skills Transportation  ADL's:  Intact  Cognition:  WNL  Sleep:  ok with cpap     Assessment/Plan: Social anxiety disorder - Plan: desvenlafaxine (PRISTIQ) 50 MG 24 hr tablet, LORazepam (ATIVAN) 0.5 MG tablet, traZODone (DESYREL) 100 MG tablet  Moderate episode of recurrent major depressive disorder (HCC) - Plan: desvenlafaxine (PRISTIQ) 50 MG 24 hr tablet  Panic attacks - Plan: LORazepam (ATIVAN) 0.5 MG tablet  Patient is slightly improved with the Pristiq.  He is more active with increased energy level.  He is tolerating Pristiq without any side effects.  He is still taking Anafranil 50 mg at bedtime.  We discussed optimizing the dose of Pristiq and further reducing the Anafranil.  He agreed with the plan.  I recommend to cut down Anafranil 25 mg for 1 week and then stop.  We will increase Pristiq 50 mg daily.  Discuss to call us back if any concerns or side effects.  Continue Ativan 0.5 mg to take as needed for severe anxiety or panic attack and trazodone 100 mg at bedtime.  We will follow-up in 6 weeks.    Follow Up Instructions:     I discussed the assessment and treatment plan with the patient. The patient was provided an opportunity to ask questions and all were answered. The patient agreed with the plan and demonstrated  an understanding of the instructions.   The patient was advised to call back or seek an in-person evaluation if the symptoms worsen or if the condition fails to improve as anticipated.    Collaboration of Care: Other provider involved in patient's care AEB notes are available in epic to review.  Patient/Guardian was advised Release of Information must be obtained prior to any record release in order to collaborate their care with an outside provider. Patient/Guardian was advised if they have not already done so to contact the registration department to sign all necessary forms in order for [place] to release information regarding their care.   Consent: Patient/Guardian gives verbal consent for treatment and assignment of benefits for services provided during this visit. Patient/Guardian expressed understanding and agreed to proceed.     I provided 20 minutes of non face to face time during this encounter.  Kathlee Nations, MD 12/03/2022

## 2022-12-09 ENCOUNTER — Other Ambulatory Visit: Payer: Self-pay | Admitting: "Endocrinology

## 2022-12-09 ENCOUNTER — Encounter: Payer: Self-pay | Admitting: "Endocrinology

## 2022-12-09 MED ORDER — TESTOSTERONE CYPIONATE 200 MG/ML IM SOLN: 100.0000 mg | INTRAMUSCULAR | 0 refills | Status: DC

## 2023-01-01 ENCOUNTER — Telehealth (HOSPITAL_COMMUNITY): Payer: Self-pay | Admitting: *Deleted

## 2023-01-01 DIAGNOSIS — F331 Major depressive disorder, recurrent, moderate: Secondary | ICD-10-CM

## 2023-01-01 NOTE — Telephone Encounter (Signed)
Pt called requesting to continue the Clomipramine 50mg  (at least) and is requesting refill to appointment on 01/14/23. Pt has been tapered down to 50 mg QD from dose of 100 mg qd then 75 mg to 50 mg qd. Pt says that he is not "doing well" without the medication. Last script sent on 11/18/22 for 100 mg total. Pt was started on Pristiq 50 mg 24 hr tabs on 12/03/22 up from 25 mg qd. Please review and advise.

## 2023-01-02 MED ORDER — CLOMIPRAMINE HCL 25 MG PO CAPS
50.0000 mg | ORAL_CAPSULE | Freq: Every day | ORAL | 0 refills | Status: DC
Start: 2023-01-02 — End: 2023-03-31

## 2023-01-02 NOTE — Telephone Encounter (Signed)
I sent Anafranil 25 mg and he can take 2 pills a day.  He need to watch carefully about shakes because he is taking multiple antidepressant.  We will discuss more on his next appointment.

## 2023-01-09 ENCOUNTER — Other Ambulatory Visit: Payer: Self-pay | Admitting: Family Medicine

## 2023-01-09 ENCOUNTER — Encounter: Payer: Self-pay | Admitting: Cardiovascular Disease

## 2023-01-09 NOTE — Progress Notes (Signed)
Cardiology Office Note:    Date:  01/10/2023   ID:  Paul Parrish Parrish, DOB [birth year], MRN [id number]  PCP:  Dettinger, Elige RadonJoshua A, MD   [facility] Providers Cardiologist:  Tiamarie Furnari   Referring MD: Dettinger, Elige RadonJoshua A, MD   Chief Complaint  Patient presents with   Tachycardia   Hyperlipidemia      Jan. 16, 2023    Paul Parrish is a 52 y.o. male with a hx of anxiety, depression , anemia , OSA,  [facility]  The last several times he has attempted to give , [facility] refused [facility] to give [facility]  Has had fatigue , lack of energy ,   Naps frequently  Has OSA - his CPAP mask is working properly ,  Wakes up feeling well .      Has been present for the past several months ,   Has the impression that his HR has been fast   No weight loss,  no sweats, no diarrhea   Works at home ( [employer] )   We started metoprolol on Sat PM - he seems to be feeling better  HR is better.  We discussed getting an echo . Given the cost, we will hold off for now and see if he feels better after being on the meds for several months     January 10, 2023 Loraine LericheMark is seen for follow up of his tachycardia, fatigue  Has OSA , CPAP has not been working well  Hx of anemia    Goes to the gym regularly  Is limited by his back at times Is watching his diet  Wt is 267 lbs ( down 15 lbs from Jan. 2023)  Needs to get a new CPAP  No cp, no dyspnea         Past Medical History:  Diagnosis Date   Anxiety    on meds   Depression    on meds   Elevated hemoglobin    Low testosterone    Sleep apnea    uses CPAP   Small bowel obstruction 10/21/2016    Past Surgical History:  Procedure Laterality Date   COLONOSCOPY  08/17/2021   LUMBAR SPINE SURGERY  12/2020   NASAL SINUS SURGERY  02/2016   WISDOM TOOTH EXTRACTION      Current Medications: Current Meds  Medication Sig   clobetasol ointment (TEMOVATE) 0.05 % Apply 1 application  topically 2 (two) times daily.   clomiPRAMINE (ANAFRANIL) 25 MG capsule Take 2 capsules (50 mg total) by mouth at bedtime.   desvenlafaxine (PRISTIQ) 50 MG 24 hr tablet Take 1 tablet (50 mg total) by mouth daily. bridge   LORazepam (ATIVAN) 0.5 MG tablet Take one tab daily as needed for anxiety   Melatonin 10 MG TABS Take 1 tablet by mouth at bedtime.   metFORMIN (GLUCOPHAGE) 500 MG tablet TAKE 1 TABLET BY MOUTH TWICE DAILY WITH A MEAL   metoprolol tartrate (LOPRESSOR) 25 MG tablet Take 1 tablet (25 mg total) by mouth 2 (two) times daily.   Multiple Vitamin (MULTI-VITAMIN DAILY PO) Take 1 capsule by mouth 2 (two) times daily. Name: Life Extensions   Omega-3 Fatty Acids (FISH OIL) 1000 MG CAPS Take 1 capsule by mouth 2 (two) times daily.   Risankizumab-rzaa (SKYRIZI PEN) 150 MG/ML SOAJ Inject 150 mg into the skin as directed. Every 12 weeks for maintenance.   rosuvastatin (CRESTOR) 10 MG tablet Take 1  tablet (10 mg total) by mouth daily.   sildenafil (REVATIO) 20 MG tablet TAKE 1 TABLET BY MOUTH ONCE DAILY AS NEEDED   SKYRIZI 150 MG/ML SOSY INJECT 150MG  SUBCUTANEOUSLY EVERY 12 WEEKS AS DIRECTED.   SYRINGE-NEEDLE, DISP, 3 ML 21G X 1-1/2" 3 ML MISC Use to inject testosterone every week   testosterone cypionate (DEPOTESTOSTERONE CYPIONATE) 200 MG/ML injection Inject 0.5 mLs (100 mg total) into the muscle every 14 (fourteen) days.   traZODone (DESYREL) 100 MG tablet Take 1 tablet (100 mg total) by mouth at bedtime as needed for sleep.     Allergies:   Amoxicillin, Bactrim [sulfamethoxazole-trimethoprim], and Penicillins   Social History   Socioeconomic History   Marital status: Married    Spouse name: Not on file   Number of children: Not on file   Years of education: Not on file   Highest education level: Not on file  Occupational History   Not on file  Tobacco Use   Smoking status: Never   Smokeless tobacco: Never  Vaping Use   Vaping Use: Never used  Substance and Sexual Activity    Alcohol use: Not Currently    Comment: 10/22/2016 "nothing in over 1 year"   Drug use: No   Sexual activity: Yes    Partners: Female    Birth control/protection: None  Other Topics Concern   Not on file  Social History Narrative   Not on file   Social Determinants of Health   Financial Resource Strain: Not on file  Food Insecurity: Not on file  Transportation Needs: Not on file  Physical Activity: Not on file  Stress: Not on file  Social Connections: Not on file     Family History: The patient's family history includes Cancer in his paternal grandmother; Diabetes in his brother and father; Diverticulitis in his mother; Hypertension in his father; Kidney cancer in his mother; Liver cancer in his mother.  ROS:   Please see the history of present illness.     All other systems reviewed and are negative.  EKGs/Labs/Other Studies Reviewed:    The following studies were reviewed today:   EKG:   January 10, 2023: Normal sinus rhythm at 83.  Normal EKG.  Recent Labs: 05/16/2022: ALT 22; BUN 12; Creatinine, Ser 0.96; Potassium 4.2; Sodium 138 08/16/2022: TSH 3.890 09/27/2022: Hemoglobin 16.8; Platelets 230  Recent Lipid Panel    Component Value Date/Time   CHOL 129 08/13/2022 0922   TRIG 217 (H) 08/13/2022 0922   HDL 31 (L) 08/13/2022 0922   CHOLHDL 4.2 08/13/2022 0922   LDLCALC 62 08/13/2022 0922     Risk Assessment/Calculations:           Physical Exam:    Physical Exam: Blood pressure 118/82, pulse (!) 124, height 6\' 2"  (1.88 m), weight 267 lb 6.4 oz (121.3 kg), SpO2 98 %.      GEN:  Well nourished, well developed in no acute distress HEENT: Normal NECK: No JVD; No carotid bruits LYMPHATICS: No lymphadenopathy CARDIAC: RRR , no murmurs, rubs, gallops RESPIRATORY:  Clear to auscultation without rales, wheezing or rhonchi  ABDOMEN: Soft, non-tender, non-distended MUSCULOSKELETAL:  No edema; No deformity  SKIN: Warm and dry NEUROLOGIC:  Alert and oriented x  3    ASSESSMENT:    No diagnosis found.  PLAN:       Sinus tachycardia: Paul Parrish presents for further evaluation of sinus tachycardia.  He has had sinus tachycardia, fatigue, generalized lack of energy for the past several months.  He is not had any unexplained weight loss.      2.  Obesity:    He is lost about 15 pounds from last year.  Seems to be doing well.  I encouraged him to work on reduction of carbohydrates.  He needs to eliminate all fried foods.  3.  Hyperlipidemia: His LDL is at goal at 62.  Continue rosuvastatin.  His triglyceride level remains a little elevated. He will get a coronary calcium score.  I will see him again in 1 year.                Medication Adjustments/Labs and Tests Ordered: Current medicines are reviewed at length with the patient today.  Concerns regarding medicines are outlined above.  No orders of the defined types were placed in this encounter.  No orders of the defined types were placed in this encounter.   There are no Patient Instructions on file for this visit.   Signed, Kristeen Miss, MD  01/10/2023 3:57 PM    [facility]

## 2023-01-10 ENCOUNTER — Ambulatory Visit: Payer: 59 | Attending: Cardiovascular Disease | Admitting: Cardiovascular Disease

## 2023-01-10 ENCOUNTER — Encounter: Payer: Self-pay | Admitting: Cardiovascular Disease

## 2023-01-10 VITALS — BP 118/82 | HR 124 | Ht 74.0 in | Wt 267.4 lb

## 2023-01-10 DIAGNOSIS — E782 Mixed hyperlipidemia: Secondary | ICD-10-CM | POA: Diagnosis not present

## 2023-01-10 DIAGNOSIS — R000 Tachycardia, unspecified: Secondary | ICD-10-CM | POA: Diagnosis not present

## 2023-01-10 DIAGNOSIS — E781 Pure hyperglyceridemia: Secondary | ICD-10-CM | POA: Diagnosis not present

## 2023-01-10 DIAGNOSIS — I10 Essential (primary) hypertension: Secondary | ICD-10-CM

## 2023-01-10 NOTE — Patient Instructions (Signed)
Medication Instructions:  Your physician recommends that you continue on your current medications as directed. Please refer to the Current Medication list given to you today.  *If you need a refill on your cardiac medications before your next appointment, please call your pharmacy*   Testing/Procedures: Calcium score    Follow-Up: At [facility], you and your health needs are our priority.  As part of our continuing mission to provide you with exceptional heart care, we have created designated Provider Care Teams.  These Care Teams include your primary Cardiologist (physician) and Advanced Practice Providers (APPs -  Physician Assistants and Nurse Practitioners) who all work together to provide you with the care you need, when you need it.   Your next appointment:   1 year(s)  Provider:   Dr. Elease Hashimoto

## 2023-01-14 ENCOUNTER — Encounter (HOSPITAL_COMMUNITY): Payer: Self-pay | Admitting: Psychiatry

## 2023-01-14 ENCOUNTER — Telehealth (HOSPITAL_BASED_OUTPATIENT_CLINIC_OR_DEPARTMENT_OTHER): Payer: 59 | Admitting: Psychiatry

## 2023-01-14 DIAGNOSIS — F331 Major depressive disorder, recurrent, moderate: Secondary | ICD-10-CM | POA: Diagnosis not present

## 2023-01-14 DIAGNOSIS — F4010 Social phobia, unspecified: Secondary | ICD-10-CM

## 2023-01-14 DIAGNOSIS — F410 Panic disorder [episodic paroxysmal anxiety] without agoraphobia: Secondary | ICD-10-CM | POA: Diagnosis not present

## 2023-01-14 MED ORDER — TRAZODONE HCL 100 MG PO TABS
100.0000 mg | ORAL_TABLET | Freq: Every evening | ORAL | 0 refills | Status: DC | PRN
Start: 2023-01-14 — End: 2023-03-17

## 2023-01-14 MED ORDER — DESVENLAFAXINE SUCCINATE ER 100 MG PO TB24: 100.0000 mg | ORAL_TABLET | Freq: Every day | ORAL | 1 refills | Status: DC

## 2023-01-14 MED ORDER — LORAZEPAM 0.5 MG PO TABS
ORAL_TABLET | ORAL | 1 refills | Status: DC
Start: 2023-01-14 — End: 2023-03-17

## 2023-01-14 NOTE — Progress Notes (Signed)
[facility] MD Virtual Progress Note   Patient Location: Home Provider Location: Home Office  I connect with patient by telephone and verified that I am speaking with correct person by using two identifiers. I discussed the limitations of evaluation and management by telemedicine and the availability of in person appointments. I also discussed with the patient that there may be a patient responsible charge related to this service. The patient expressed understanding and agreed to proceed.  Paul Parrish [id number] 52 y.o.  01/14/2023 3:25 PM  History of Present Illness:  Patient is evaluated by phone session.  He is taking Pristiq 50 mg and he noticed improvement in his mood depression but is still having anxiety with nervousness.  He tried to cut down his Anafranil but started to have withdrawal symptoms.  He is also taking Ativan almost every day when he feels very nervous and anxious around people.  Recently had a visit with cardiology after feeling fatigued but realized he need to have a new sleep study and new mask.  He is taking melatonin and trazodone.  He liked the Pristiq which is helping his depression, social anxiety and does not feel hopeless.  His appetite is okay.  He denies any tremors, shakes or any EPS.  He denies any hallucination, paranoia or any anger.  His job is going well.   Past Psychiatric History: H/O anxiety and depression. Saw provider at neuropsychiatry in [place] but not happy with the treatment.  Saw Dr. Rene Kocher in 2018 and prescribed Prozac, Xanax, Effexor, Celexa, propanolol, Lamictal, Vistaril, Adderall, Wellbutrin,  Cymbalta (increased irritability ), Anafranil and Luvox (did not help). H/O of ECT with no response.  No h/o suicidal attempt or inpatient. At [facility] tried [place], Klonopin but did not work. H/O heavy drinking but claims to be sober.      Outpatient Encounter Medications as of 01/14/2023  Medication Sig   clobetasol ointment  (TEMOVATE) 0.05 % Apply 1 application topically 2 (two) times daily.   clomiPRAMINE (ANAFRANIL) 25 MG capsule Take 2 capsules (50 mg total) by mouth at bedtime.   desvenlafaxine (PRISTIQ) 50 MG 24 hr tablet Take 1 tablet (50 mg total) by mouth daily. bridge   LORazepam (ATIVAN) 0.5 MG tablet Take one tab daily as needed for anxiety   Melatonin 10 MG TABS Take 1 tablet by mouth at bedtime.   metFORMIN (GLUCOPHAGE) 500 MG tablet TAKE 1 TABLET BY MOUTH TWICE DAILY WITH A MEAL   metoprolol tartrate (LOPRESSOR) 25 MG tablet Take 1 tablet (25 mg total) by mouth 2 (two) times daily.   Multiple Vitamin (MULTI-VITAMIN DAILY PO) Take 1 capsule by mouth 2 (two) times daily. Name: Life Extensions   Omega-3 Fatty Acids (FISH OIL) 1000 MG CAPS Take 1 capsule by mouth 2 (two) times daily.   Risankizumab-rzaa (SKYRIZI PEN) 150 MG/ML SOAJ Inject 150 mg into the skin as directed. Every 12 weeks for maintenance.   rosuvastatin (CRESTOR) 10 MG tablet Take 1 tablet (10 mg total) by mouth daily.   sildenafil (REVATIO) 20 MG tablet TAKE 1 TABLET BY MOUTH ONCE DAILY AS NEEDED   SKYRIZI 150 MG/ML SOSY INJECT  SUBCUTANEOUSLY EVERY 12 WEEKS AS DIRECTED.   SYRINGE-NEEDLE, DISP, 3 ML 21G X 1-1/2" 3 ML MISC Use to inject testosterone every week   testosterone cypionate (DEPOTESTOSTERONE CYPIONATE) 200 MG/ML injection Inject 0.5 mLs (100 mg total) into the muscle every 14 (fourteen) days.   traZODone (DESYREL) 100 MG tablet Take 1 tablet (100 mg total)  by mouth at bedtime as needed for sleep.   No facility-administered encounter medications on file as of 01/14/2023.    No results found for this or any previous visit (from the past 2160 hour(s)).   Psychiatric Specialty Exam: Physical Exam  Review of Systems  Weight 260 lb (117.9 kg).There is no height or weight on file to calculate BMI.  General Appearance: NA  Eye Contact:  NA  Speech:  Normal Rate  Volume:  Normal  Mood:  Anxious  Affect:  NA  Thought  Process:  Goal Directed  Orientation:  Full (Time, Place, and Person)  Thought Content:  Rumination  Suicidal Thoughts:  No  Homicidal Thoughts:  No  Memory:  Immediate;   Good Recent;   Good Remote;   Good  Judgement:  Intact  Insight:  Present  Psychomotor Activity:  NA  Concentration:  Concentration: Good and Attention Span: Good  Recall:  Good  Fund of Knowledge:  Good  Language:  Good  Akathisia:  No  Handed:  Right  AIMS (if indicated):     Assets:  Communication Skills Desire for Improvement Housing Social Support Talents/Skills Transportation  ADL's:  Intact  Cognition:  WNL  Sleep: Okay     Assessment/Plan: Social anxiety disorder - Plan: desvenlafaxine (PRISTIQ) 100 MG 24 hr tablet, traZODone (DESYREL) 100 MG tablet, LORazepam (ATIVAN) 0.5 MG tablet  Moderate episode of recurrent major depressive disorder - Plan: desvenlafaxine (PRISTIQ) 100 MG 24 hr tablet  Panic attacks - Plan: LORazepam (ATIVAN) 0.5 MG tablet  Discussed current medication and records from other provider.  Has been taking melatonin, trazodone along with CPAP and also taking Anafranil, Pristiq and lorazepam.  Discussed polypharmacy.  Recommend that should cut down slowly and gradually Anafranil.  Currently he is taking 25 mg and I recommend to take every other day to avoid withdrawals and in 1 week should able to come off from Anafranil.  He recently picked up his Anafranil has enough pills so he can cut it down.  I will increase Pristiq 100 mg to help his anxiety, nervousness.  I recommend to take the Ativan only as needed when he had a panic attack.  Continue trazodone 100 mg at bedtime.  Patient is in the process of getting a new sleep study and new mask.  Recommend to call us back if is any question or any concern.  Follow-up in 2 months.   Follow Up Instructions:     I discussed the assessment and treatment plan with the patient. The patient was provided an opportunity to ask questions and  all were answered. The patient agreed with the plan and demonstrated an understanding of the instructions.   The patient was advised to call back or seek an in-person evaluation if the symptoms worsen or if the condition fails to improve as anticipated.    Collaboration of Care: Other provider involved in patient's care AEB notes are available in epic to review.  Patient/Guardian was advised Release of Information must be obtained prior to any record release in order to collaborate their care with an outside provider. Patient/Guardian was advised if they have not already done so to contact the registration department to sign all necessary forms in order for [place] to release information regarding their care.   Consent: Patient/Guardian gives verbal consent for treatment and assignment of benefits for services provided during this visit. Patient/Guardian expressed understanding and agreed to proceed.     I provided 27 minutes of non face to  face time during this encounter.  Note: This document was prepared by [employer] voice dictation technology and any errors that results from this process are unintentional.    Cleotis Nipper, MD 01/14/2023

## 2023-01-20 ENCOUNTER — Other Ambulatory Visit: Payer: Self-pay | Admitting: Cardiovascular Disease

## 2023-01-24 ENCOUNTER — Other Ambulatory Visit: Payer: 59

## 2023-01-30 ENCOUNTER — Encounter: Payer: Self-pay | Admitting: "Endocrinology

## 2023-01-30 ENCOUNTER — Other Ambulatory Visit (HOSPITAL_BASED_OUTPATIENT_CLINIC_OR_DEPARTMENT_OTHER): Payer: 59

## 2023-01-30 ENCOUNTER — Ambulatory Visit: Payer: 59 | Admitting: "Endocrinology

## 2023-01-30 VITALS — BP 118/68 | HR 68 | Ht 74.0 in | Wt 264.0 lb

## 2023-01-30 DIAGNOSIS — R7303 Prediabetes: Secondary | ICD-10-CM

## 2023-01-30 DIAGNOSIS — E291 Testicular hypofunction: Secondary | ICD-10-CM | POA: Diagnosis not present

## 2023-01-30 LAB — TESTOSTERONE, FREE, TOTAL, SHBG
Sex Hormone Binding: 25.6 nmol/L (ref 19.3–76.4)
Testosterone, Free: 10.9 pg/mL (ref 7.2–24.0)
Testosterone: 615 ng/dL (ref 264–916)

## 2023-01-30 LAB — PSA: Prostate Specific Ag, Serum: 0.4 ng/mL (ref 0.0–4.0)

## 2023-01-30 MED ORDER — TESTOSTERONE CYPIONATE 200 MG/ML IM SOLN: 100.0000 mg | INTRAMUSCULAR | 0 refills | Status: DC

## 2023-01-30 NOTE — Progress Notes (Signed)
01/30/2023      Endocrinology follow-up note   HPI: Paul Parrish is a 52 y.o.-year-old man.  - He is being seen in follow-up for  hypogonadism.  He was diagnosed with hypogonadism in 2014, etiology unclear.   -Due to polycythemia, he undergoes phlebotomy periodically.  During his last 2 visits, he was found to have polycythemia which forced discontinuation of the testosterone supplement.  Due to to his total testosterone being at 169 with correction of his CBC profile, he was restarted on testosterone injection 100 mg every 14 days.  He returns with improved total testosterone of 615, recently favorable CBC and CMP profile.   He has no new complaints today.  Patient wishes to continue on testosterone treatment.  He reports improvement in his symptoms including fatigue and low libido.    -He reports history of seizures as a young man until his teenage years. -He denies any history of head injury.  - He also is recently diagnosed with sleep apnea currently on CPAP.  - He fathers 2 teenage boys biologically. - He has significant mood disorder on multiple antidepressants and antianxiety. No herbal medicines. -He denies family history of premature coronary artery disease.   ROS: Limited as above.  PE: BP 118/68   Pulse 68   Ht 6\' 2"  (1.88 m)   Wt 264 lb (119.7 kg)   BMI 33.90 kg/m  Wt Readings from Last 3 Encounters:  01/30/23 264 lb (119.7 kg)  01/10/23 267 lb 6.4 oz (121.3 kg)  10/02/22 269 lb 9.6 oz (122.3 kg)     Recent Results (from the past 2160 hour(s))  Testosterone, Free, Total, SHBG     Status: None   Collection Time: 01/24/23  9:12 AM  Result Value Ref Range   Testosterone 615 264 - 916 ng/dL    Comment: Adult male reference interval is based on a population of healthy nonobese males (BMI <30) between 30 and 60 years old. Travison, et.al. JCEM [phone]. PMID: [id number].    Testosterone, Free 10.9 7.2 - 24.0 pg/mL   Sex Hormone Binding 25.6 19.3 - 76.4  nmol/L  PSA     Status: None   Collection Time: 01/24/23  9:12 AM  Result Value Ref Range   Prostate Specific Ag, Serum 0.4 0.0 - 4.0 ng/mL    Comment: Roche ECLIA methodology. According to the American Urological Association, Serum PSA should decrease and remain at undetectable levels after radical prostatectomy. The AUA defines biochemical recurrence as an initial PSA value 0.2 ng/mL or greater followed by a subsequent confirmatory PSA value 0.2 ng/mL or greater. Values obtained with different assay methods or kits cannot be used interchangeably. Results cannot be interpreted as absolute evidence of the presence or absence of malignant disease.       ASSESSMENT: 1.  Hypogonadism  2. Erectile Dysfunction 3.  Polycythemia 4.  Prediabetes  PLAN:  1. Hypogonadism  -He did phlebotomy at least once since his last visit.  His recent labs are showing continued favorable CBC profile.  His total testosterone is 615 ng per DL. He is benefiting from testosterone supplement.     He is advised to continue testosterone 100 mg IM every 14 days with plan to repeat total testosterone, CBC and CMP in 4 months.    His history of  secondary polycythemia which continues to require periodic phlebotomy, as well as sleep apnea requiring CPAP treatment making him a poor candidate for more generous testosterone replacement. Regarding his weight concern: His insurance did  not provide coverage for injectable weight loss medications. He remains a good fit for lifestyle medicine.  - he acknowledges that there is a room for improvement in his food and drink choices. - Suggestion is made for him to avoid simple carbohydrates  from his diet including Cakes, Sweet Desserts, Ice Cream, Soda (diet and regular), Sweet Tea, Candies, Chips, Cookies, Store Bought Juices, Alcohol , Artificial Sweeteners,  Coffee Creamer, and "Sugar-free" Products, Lemonade. This will help patient to have more stable blood glucose  profile and potentially avoid unintended weight gain.  The following Lifestyle Medicine recommendations according to American College of Lifestyle Medicine  [facility]) were discussed and and offered to patient and he  agrees to start the journey:  A. Whole Foods, Plant-Based Nutrition comprising of fruits and vegetables, plant-based proteins, whole-grain carbohydrates was discussed in detail with the patient.   A list for source of those nutrients were also provided to the patient.  Patient will use only water or unsweetened tea for hydration. B.  The need to stay away from risky substances including alcohol, smoking; obtaining 7 to 9 hours of restorative sleep, at least 150 minutes of moderate intensity exercise weekly, the importance of healthy social connections,  and stress management techniques were discussed. C.  A full color page of  Calorie density of various food groups per pound showing examples of each food groups was provided to the patient.   He is advised to continue close follow-up with his PMD for primary care needs.    I spent  22  minutes in the care of the patient today including review of labs from Thyroid Function, CMP, and other relevant labs ; imaging/biopsy records (current and previous including abstractions from other facilities); face-to-face time discussing  his lab results and symptoms, medications doses, his options of short and long term treatment based on the latest standards of care / guidelines;   and documenting the encounter.  Nolene Bernheim  participated in the discussions, expressed understanding, and voiced agreement with the above plans.  All questions were answered to his satisfaction. he is encouraged to contact clinic should he have any questions or concerns prior to his return visit.   Marquis Lunch, MD Phone: [phone]  Fax: [phone]  -  This note was partially dictated with voice recognition software. Similar sounding words can be transcribed  inadequately or may not  be corrected upon review.  01/30/2023, 5:12 PM

## 2023-02-13 ENCOUNTER — Encounter: Payer: 59 | Admitting: Family Medicine

## 2023-02-17 ENCOUNTER — Encounter: Payer: Self-pay | Admitting: Family Medicine

## 2023-02-19 ENCOUNTER — Other Ambulatory Visit: Payer: Self-pay

## 2023-02-19 ENCOUNTER — Other Ambulatory Visit: Payer: 59

## 2023-02-19 DIAGNOSIS — R7303 Prediabetes: Secondary | ICD-10-CM

## 2023-02-19 DIAGNOSIS — I10 Essential (primary) hypertension: Secondary | ICD-10-CM

## 2023-02-19 DIAGNOSIS — E782 Mixed hyperlipidemia: Secondary | ICD-10-CM

## 2023-02-19 DIAGNOSIS — E781 Pure hyperglyceridemia: Secondary | ICD-10-CM

## 2023-02-19 DIAGNOSIS — R000 Tachycardia, unspecified: Secondary | ICD-10-CM

## 2023-02-19 LAB — CBC WITH DIFFERENTIAL/PLATELET
Basophils Absolute: 0.1 10*3/uL (ref 0.0–0.2)
Basos: 1 %
EOS (ABSOLUTE): 0.2 10*3/uL (ref 0.0–0.4)
Eos: 3 %
Hematocrit: 52.4 % — ABNORMAL HIGH (ref 37.5–51.0)
Hemoglobin: 17.8 g/dL — ABNORMAL HIGH (ref 13.0–17.7)
Immature Grans (Abs): 0 10*3/uL (ref 0.0–0.1)
Immature Granulocytes: 0 %
Lymphocytes Absolute: 2.9 10*3/uL (ref 0.7–3.1)
Lymphs: 42 %
MCH: 30.5 pg (ref 26.6–33.0)
MCHC: 34 g/dL (ref 31.5–35.7)
MCV: 90 fL (ref 79–97)
Monocytes Absolute: 0.7 10*3/uL (ref 0.1–0.9)
Monocytes: 10 %
Neutrophils Absolute: 3 10*3/uL (ref 1.4–7.0)
Neutrophils: 44 %
Platelets: 239 10*3/uL (ref 150–450)
RBC: 5.84 x10E6/uL — ABNORMAL HIGH (ref 4.14–5.80)
RDW: 14 % (ref 11.6–15.4)
WBC: 6.8 10*3/uL (ref 3.4–10.8)

## 2023-02-19 LAB — CMP14+EGFR
ALT: 25 IU/L (ref 0–44)
AST: 21 IU/L (ref 0–40)
Albumin/Globulin Ratio: 2 (ref 1.2–2.2)
Albumin: 4.7 g/dL (ref 3.8–4.9)
Alkaline Phosphatase: 53 IU/L (ref 44–121)
BUN/Creatinine Ratio: 9 (ref 9–20)
BUN: 10 mg/dL (ref 6–24)
Bilirubin Total: 0.6 mg/dL (ref 0.0–1.2)
CO2: 27 mmol/L (ref 20–29)
Calcium: 9.6 mg/dL (ref 8.7–10.2)
Chloride: 100 mmol/L (ref 96–106)
Creatinine, Ser: 1.07 mg/dL (ref 0.76–1.27)
Globulin, Total: 2.4 g/dL (ref 1.5–4.5)
Glucose: 89 mg/dL (ref 70–99)
Potassium: 4.5 mmol/L (ref 3.5–5.2)
Sodium: 138 mmol/L (ref 134–144)
Total Protein: 7.1 g/dL (ref 6.0–8.5)
eGFR: 84 mL/min/{1.73_m2} (ref >59)

## 2023-02-19 LAB — LIPID PANEL
Chol/HDL Ratio: 4.9 ratio (ref 0.0–5.0)
Cholesterol, Total: 137 mg/dL (ref 100–199)
HDL: 28 mg/dL — ABNORMAL LOW (ref >39)
LDL Chol Calc (NIH): 58 mg/dL (ref 0–99)
Triglycerides: 328 mg/dL — ABNORMAL HIGH (ref 0–149)
VLDL Cholesterol Cal: 51 mg/dL — ABNORMAL HIGH (ref 5–40)

## 2023-02-19 LAB — BAYER DCA HB A1C WAIVED: HB A1C (BAYER DCA - WAIVED): 5.8 % — ABNORMAL HIGH (ref 4.8–5.6)

## 2023-02-20 ENCOUNTER — Encounter: Payer: Self-pay | Admitting: Family Medicine

## 2023-02-20 ENCOUNTER — Ambulatory Visit (INDEPENDENT_AMBULATORY_CARE_PROVIDER_SITE_OTHER): Payer: 59 | Admitting: Family Medicine

## 2023-02-20 VITALS — BP 126/79 | HR 78 | Temp 97.6°F | Ht 74.0 in | Wt 265.4 lb

## 2023-02-20 DIAGNOSIS — Z0000 Encounter for general adult medical examination without abnormal findings: Secondary | ICD-10-CM

## 2023-02-20 DIAGNOSIS — R7303 Prediabetes: Secondary | ICD-10-CM

## 2023-02-20 DIAGNOSIS — I10 Essential (primary) hypertension: Secondary | ICD-10-CM

## 2023-02-20 DIAGNOSIS — Z6833 Body mass index (BMI) 33.0-33.9, adult: Secondary | ICD-10-CM

## 2023-02-20 DIAGNOSIS — E782 Mixed hyperlipidemia: Secondary | ICD-10-CM | POA: Diagnosis not present

## 2023-02-20 DIAGNOSIS — E781 Pure hyperglyceridemia: Secondary | ICD-10-CM

## 2023-02-20 DIAGNOSIS — Z0001 Encounter for general adult medical examination with abnormal findings: Secondary | ICD-10-CM

## 2023-02-20 DIAGNOSIS — E6609 Other obesity due to excess calories: Secondary | ICD-10-CM

## 2023-02-20 MED ORDER — FENOFIBRATE 48 MG PO TABS
48.0000 mg | ORAL_TABLET | Freq: Every day | ORAL | 3 refills | Status: DC
Start: 2023-02-20 — End: 2023-04-11

## 2023-02-20 MED ORDER — METFORMIN HCL 500 MG PO TABS: 500.0000 mg | ORAL_TABLET | Freq: Two times a day (BID) | ORAL | 0 refills | Status: DC

## 2023-02-20 MED ORDER — ROSUVASTATIN CALCIUM 10 MG PO TABS: 10.0000 mg | ORAL_TABLET | Freq: Every day | ORAL | 3 refills | Status: DC

## 2023-02-20 NOTE — Progress Notes (Signed)
BP 126/79   Pulse 78   Temp 97.6 F (36.4 C) (Temporal)   Ht 6\' 2"  (1.88 m)   Wt 265 lb 6.4 oz (120.4 kg)   SpO2 96%   BMI 34.08 kg/m    Subjective:   Patient ID: Paul Parrish, male    DOB: 1971/05/01, 52 y.o.   MRN: [id number]  HPI: Paul Parrish is a 52 y.o. male presenting on 02/20/2023 for Annual Exam and stomach issue (Can hear stomach making noises. Has been going on for couple months. Not painful. Sometimes has loose stools. )   HPI Physical exam Patient denies any chest pain, shortness of breath, headaches or vision issues, or joint issues.  Patient's only real complaint is that sometimes he hears stomach gurgling a lot and then gets intermittent diarrhea.  He says this has been going off and on over the past few months that he is noticed but he cannot recall anything specific that brought it on.  He denies any nausea or vomiting or constipation.  The diarrhea is only intermittent.  He denies any abdominal pain or belching or burping.  Hypertension Patient is currently on metoprolol, and their blood pressure today is 126/79. Patient denies any lightheadedness or dizziness. Patient denies headaches, blurred vision, chest pains, shortness of breath, or weakness. Denies any side effects from medication and is content with current medication.   Hyperlipidemia and hypertriglyceridemia Patient is coming in for recheck of his hyperlipidemia. The patient is currently taking Crestor and fish oils. They deny any issues with myalgias or history of liver damage from it. They deny any focal numbness or weakness or chest pain.   Prediabetes Patient comes in today for recheck of his diabetes. Patient has been currently taking metformin. Patient is not currently on an ACE inhibitor/ARB. Patient has not seen an ophthalmologist this year. Patient denies any new issues with their feet. The symptom started onset as an adult hypertension and hyperlipidemia ARE RELATED TO DM   Relevant past medical,  surgical, family and social history reviewed and updated as indicated. Interim medical history since our last visit reviewed. Allergies and medications reviewed and updated.  Review of Systems  Constitutional:  Negative for chills and fever.  Eyes:  Negative for visual disturbance.  Respiratory:  Negative for shortness of breath and wheezing.   Cardiovascular:  Negative for chest pain and leg swelling.  Gastrointestinal:  Positive for diarrhea. Negative for abdominal distention, abdominal pain, anal bleeding, blood in stool, nausea and vomiting.  Musculoskeletal:  Negative for back pain and gait problem.  Skin:  Negative for rash.  All other systems reviewed and are negative.   Per HPI unless specifically indicated above   Allergies as of 02/20/2023       Reactions   Amoxicillin Other (See Comments)   High fever and possible heart racing   Bactrim [sulfamethoxazole-trimethoprim] Other (See Comments)   Fever, body aches, chills   Penicillins Other (See Comments)   High fever Has patient had a PCN reaction causing immediate rash, facial/tongue/throat swelling, SOB or lightheadedness with hypotension: No Has patient had a PCN reaction causing severe rash involving mucus membranes or skin necrosis: No Has patient had a PCN reaction that required hospitalization No Has patient had a PCN reaction occurring within the last 10 years: No If all of the above answers are "NO", then may proceed with Cephalosporin use.        Medication List        Accurate as of Feb 20, 2023  4:47 PM. If you have any questions, ask your nurse or doctor.          clobetasol ointment 0.05 % Commonly known as: TEMOVATE Apply 1 application topically 2 (two) times daily.   clomiPRAMINE 25 MG capsule Commonly known as: ANAFRANIL Take 2 capsules (50 mg total) by mouth at bedtime. What changed:  how much to take additional instructions   desvenlafaxine 100 MG 24 hr tablet Commonly known as:  Pristiq Take 1 tablet (100 mg total) by mouth daily. bridge   fenofibrate 48 MG tablet Commonly known as: Tricor Take 1 tablet (48 mg total) by mouth daily. Started by: Paul Radon Ebba Goll, MD   Fish Oil 1000 MG Caps Take 1 capsule by mouth 2 (two) times daily.   LORazepam 0.5 MG tablet Commonly known as: Ativan Take one tab daily as needed for anxiety   Melatonin 10 MG Tabs Take 1 tablet by mouth at bedtime.   metFORMIN 500 MG tablet Commonly known as: GLUCOPHAGE Take 1 tablet (500 mg total) by mouth 2 (two) times daily with a meal.   metoprolol tartrate 25 MG tablet Commonly known as: LOPRESSOR Take 1 tablet by mouth twice daily   MULTI-VITAMIN DAILY PO Take 1 capsule by mouth 2 (two) times daily. Name: Life Extensions   rosuvastatin 10 MG tablet Commonly known as: Crestor Take 1 tablet (10 mg total) by mouth daily.   sildenafil 20 MG tablet Commonly known as: REVATIO TAKE 1 TABLET BY MOUTH ONCE DAILY AS NEEDED   Skyrizi 150 MG/ML Sosy Generic drug: Risankizumab-rzaa INJECT 150MG  SUBCUTANEOUSLY EVERY 12 WEEKS AS DIRECTED.   Skyrizi Pen 150 MG/ML Soaj Generic drug: Risankizumab-rzaa Inject 150 mg into the skin as directed. Every 12 weeks for maintenance.   SYRINGE-NEEDLE (DISP) 3 ML 21G X 1-1/2" 3 ML Misc Use to inject testosterone every week   testosterone cypionate 200 MG/ML injection Commonly known as: DEPOTESTOSTERONE CYPIONATE Inject 0.5 mLs (100 mg total) into the muscle every 14 (fourteen) days.   traZODone 100 MG tablet Commonly known as: DESYREL Take 1 tablet (100 mg total) by mouth at bedtime as needed for sleep.         Objective:   BP 126/79   Pulse 78   Temp 97.6 F (36.4 C) (Temporal)   Ht 6\' 2"  (1.88 m)   Wt 265 lb 6.4 oz (120.4 kg)   SpO2 96%   BMI 34.08 kg/m   Wt Readings from Last 3 Encounters:  02/20/23 265 lb 6.4 oz (120.4 kg)  01/30/23 264 lb (119.7 kg)  01/10/23 267 lb 6.4 oz (121.3 kg)    Physical Exam Vitals and  nursing note reviewed.  Constitutional:      General: He is not in acute distress.    Appearance: He is well-developed. He is not diaphoretic.  HENT:     Right Ear: External ear normal.     Left Ear: External ear normal.     Nose: Nose normal.     Mouth/Throat:     Pharynx: No oropharyngeal exudate.  Eyes:     General: No scleral icterus.    Conjunctiva/sclera: Conjunctivae normal.  Neck:     Thyroid: No thyromegaly.  Cardiovascular:     Rate and Rhythm: Normal rate and regular rhythm.     Heart sounds: Normal heart sounds. No murmur heard. Pulmonary:     Effort: Pulmonary effort is normal. No respiratory distress.     Breath sounds: Normal breath sounds. No wheezing.  Abdominal:     General: Abdomen is flat. Bowel sounds are normal. There is no distension.     Palpations: Abdomen is soft.     Tenderness: There is no abdominal tenderness. There is no guarding or rebound.  Musculoskeletal:        General: No swelling. Normal range of motion.     Cervical back: Neck supple.  Lymphadenopathy:     Cervical: No cervical adenopathy.  Skin:    General: Skin is warm and dry.     Findings: No rash.  Neurological:     Mental Status: He is alert and oriented to person, place, and time.     Coordination: Coordination normal.  Psychiatric:        Behavior: Behavior normal.     Results for orders placed or performed in visit on 02/19/23  CBC with Differential/Platelet  Result Value Ref Range   WBC 6.8 3.4 - 10.8 x10E3/uL   RBC 5.84 (H) 4.14 - 5.80 x10E6/uL   Hemoglobin 17.8 (H) 13.0 - 17.7 g/dL   Hematocrit 42.7 (H) 06.2 - 51.0 %   MCV 90 79 - 97 fL   MCH 30.5 26.6 - 33.0 pg   MCHC 34.0 31.5 - 35.7 g/dL   RDW 37.6 28.3 - 15.1 %   Platelets 239 150 - 450 x10E3/uL   Neutrophils 44 Not Estab. %   Lymphs 42 Not Estab. %   Monocytes 10 Not Estab. %   Eos 3 Not Estab. %   Basos 1 Not Estab. %   Neutrophils Absolute 3.0 1.4 - 7.0 x10E3/uL   Lymphocytes Absolute 2.9 0.7 - 3.1  x10E3/uL   Monocytes Absolute 0.7 0.1 - 0.9 x10E3/uL   EOS (ABSOLUTE) 0.2 0.0 - 0.4 x10E3/uL   Basophils Absolute 0.1 0.0 - 0.2 x10E3/uL   Immature Granulocytes 0 Not Estab. %   Immature Grans (Abs) 0.0 0.0 - 0.1 x10E3/uL  CMP14+EGFR  Result Value Ref Range   Glucose 89 70 - 99 mg/dL   BUN 10 6 - 24 mg/dL   Creatinine, Ser 7.61 0.76 - 1.27 mg/dL   eGFR 84 >60 VP/XTG/6.26   BUN/Creatinine Ratio 9 9 - 20   Sodium 138 134 - 144 mmol/L   Potassium 4.5 3.5 - 5.2 mmol/L   Chloride 100 96 - 106 mmol/L   CO2 27 20 - 29 mmol/L   Calcium 9.6 8.7 - 10.2 mg/dL   Total Protein 7.1 6.0 - 8.5 g/dL   Albumin 4.7 3.8 - 4.9 g/dL   Globulin, Total 2.4 1.5 - 4.5 g/dL   Albumin/Globulin Ratio 2.0 1.2 - 2.2   Bilirubin Total 0.6 0.0 - 1.2 mg/dL   Alkaline Phosphatase 53 44 - 121 IU/L   AST 21 0 - 40 IU/L   ALT 25 0 - 44 IU/L  Lipid panel  Result Value Ref Range   Cholesterol, Total 137 100 - 199 mg/dL   Triglycerides 948 (H) 0 - 149 mg/dL   HDL 28 (L) >54 mg/dL   VLDL Cholesterol Cal 51 (H) 5 - 40 mg/dL   LDL Chol Calc (NIH) 58 0 - 99 mg/dL   Chol/HDL Ratio 4.9 0.0 - 5.0 ratio  Bayer DCA Hb A1c Waived  Result Value Ref Range   HB A1C (BAYER DCA - WAIVED) 5.8 (H) 4.8 - 5.6 %    Assessment & Plan:   Problem List Items Addressed This Visit       Cardiovascular and Mediastinum   Hypertension   Relevant Medications  rosuvastatin (CRESTOR) 10 MG tablet   fenofibrate (TRICOR) 48 MG tablet     Other   Hypertriglyceridemia   Relevant Medications   rosuvastatin (CRESTOR) 10 MG tablet   fenofibrate (TRICOR) 48 MG tablet   Prediabetes   Relevant Medications   metFORMIN (GLUCOPHAGE) 500 MG tablet   Hyperlipidemia   Relevant Medications   rosuvastatin (CRESTOR) 10 MG tablet   fenofibrate (TRICOR) 48 MG tablet   Other Visit Diagnoses     Physical exam    -  Primary   Class 1 obesity due to excess calories with serious comorbidity and body mass index (BMI) of 33.0 to 33.9 in adult        Relevant Medications   metFORMIN (GLUCOPHAGE) 500 MG tablet   rosuvastatin (CRESTOR) 10 MG tablet     A1c looks good at 5.8, he will trial off the metformin for couple weeks and see if his stomach issues resolve.  He will let me know from there.  Will start fenofibrate for triglycerides that are up to 300.  Follow up plan: Return in about 6 months (around 08/23/2023), or if symptoms worsen or fail to improve, for Prediabetes hypertension and cholesterol.  Counseling provided for all of the vaccine components No orders of the defined types were placed in this encounter.   Arville Care, MD [facility] 02/20/2023, 4:47 PM

## 2023-03-07 ENCOUNTER — Ambulatory Visit (HOSPITAL_BASED_OUTPATIENT_CLINIC_OR_DEPARTMENT_OTHER)
Admission: RE | Admit: 2023-03-07 | Discharge: 2023-03-07 | Disposition: A | Discharge status: Home / Self Care | Payer: 59 | Source: Ambulatory Visit | Attending: Cardiovascular Disease | Admitting: Cardiovascular Disease

## 2023-03-07 DIAGNOSIS — E781 Pure hyperglyceridemia: Secondary | ICD-10-CM | POA: Insufficient documentation

## 2023-03-07 DIAGNOSIS — R000 Tachycardia, unspecified: Secondary | ICD-10-CM | POA: Insufficient documentation

## 2023-03-07 DIAGNOSIS — I10 Essential (primary) hypertension: Secondary | ICD-10-CM | POA: Insufficient documentation

## 2023-03-07 DIAGNOSIS — E782 Mixed hyperlipidemia: Secondary | ICD-10-CM | POA: Insufficient documentation

## 2023-03-16 ENCOUNTER — Encounter: Payer: Self-pay | Admitting: Family Medicine

## 2023-03-17 ENCOUNTER — Encounter (HOSPITAL_COMMUNITY): Payer: Self-pay | Admitting: Psychiatry

## 2023-03-17 ENCOUNTER — Telehealth (HOSPITAL_BASED_OUTPATIENT_CLINIC_OR_DEPARTMENT_OTHER): Payer: 59 | Admitting: Psychiatry

## 2023-03-17 VITALS — Wt 265.0 lb

## 2023-03-17 DIAGNOSIS — F331 Major depressive disorder, recurrent, moderate: Secondary | ICD-10-CM | POA: Diagnosis not present

## 2023-03-17 DIAGNOSIS — F410 Panic disorder [episodic paroxysmal anxiety] without agoraphobia: Secondary | ICD-10-CM

## 2023-03-17 DIAGNOSIS — F4010 Social phobia, unspecified: Secondary | ICD-10-CM | POA: Diagnosis not present

## 2023-03-17 MED ORDER — DESVENLAFAXINE SUCCINATE ER 100 MG PO TB24: 100.0000 mg | ORAL_TABLET | Freq: Every day | ORAL | 0 refills | Status: DC

## 2023-03-17 MED ORDER — VORTIOXETINE HBR 10 MG PO TABS
ORAL_TABLET | ORAL | 0 refills | Status: DC
Start: 2023-03-17 — End: 2023-03-31

## 2023-03-17 MED ORDER — LORAZEPAM 0.5 MG PO TABS
ORAL_TABLET | ORAL | 0 refills | Status: DC
Start: 2023-03-17 — End: 2023-04-14

## 2023-03-17 MED ORDER — TRAZODONE HCL 100 MG PO TABS
100.0000 mg | ORAL_TABLET | Freq: Every evening | ORAL | 0 refills | Status: DC | PRN
Start: 2023-03-17 — End: 2023-04-14

## 2023-03-17 NOTE — Progress Notes (Signed)
[facility] MD Virtual Progress Note   Patient Location: Home Provider Location: Home Office  I connect with patient by telephone and verified that I am speaking with correct person by using two identifiers. I discussed the limitations of evaluation and management by telemedicine and the availability of in person appointments. I also discussed with the patient that there may be a patient responsible charge related to this service. The patient expressed understanding and agreed to proceed.  Paul Parrish [id number] 52 y.o.  03/17/2023 3:06 PM  History of Present Illness:  Patient is evaluated by phone session.  He reported increased anxiety and more panic attacks since he increased the Pristiq 100 mg.  He stopped taking Anafranil.  He admitted taking Ativan almost daily because he has 2-3 severe panic attack when he was thinking to call the ambulance but able to calm himself down.  He is not sure what triggered the anxiety but he feel very nervous, anxious around people.  Denies any crying spells or any suicidal thoughts but feels nervous and anxious most of the time.  He sleeps okay.  He takes the trazodone and melatonin.  He has no tremors, shakes or any EPS.  He is disappointed because he thought the Pristiq helping and we had increased the dose on the last visit.  He is not sure if the Pristiq is the right medicine for him.  He had tried in the past Celexa, Effexor, Prozac, Wellbutrin, Cymbalta, Anafranil and Luvox.  He also had a history of ECT with limited response.  He had never tried Trintellix and we discussed to try Trintellix.  Denies drinking or using any illegal substances.  He denies any stressors and reported job is going well.  Past Psychiatric History: H/O anxiety and depression. Saw provider at neuropsychiatry in [place] but not happy with the treatment.  Saw Dr. Rene Kocher in 2018 and prescribed Prozac, Xanax, Effexor, Celexa, propanolol, Lamictal, Vistaril, Adderall,  Wellbutrin,  Cymbalta (increased irritability ), Anafranil and Luvox (did not help). H/O of ECT with no response.  No h/o suicidal attempt or inpatient. At [facility] tried [place], Klonopin but did not work. H/O heavy drinking but claims to be sober.      Outpatient Encounter Medications as of 03/17/2023  Medication Sig   clobetasol ointment (TEMOVATE) 0.05 % Apply 1 application topically 2 (two) times daily.   clomiPRAMINE (ANAFRANIL) 25 MG capsule Take 2 capsules (50 mg total) by mouth at bedtime. (Patient taking differently: Take 25 mg by mouth at bedtime. Every other day for one week and than stop.)   desvenlafaxine (PRISTIQ) 100 MG 24 hr tablet Take 1 tablet (100 mg total) by mouth daily. bridge   fenofibrate (TRICOR) 48 MG tablet Take 1 tablet (48 mg total) by mouth daily.   LORazepam (ATIVAN) 0.5 MG tablet Take one tab daily as needed for anxiety   Melatonin 10 MG TABS Take 1 tablet by mouth at bedtime.   metFORMIN (GLUCOPHAGE) 500 MG tablet Take 1 tablet (500 mg total) by mouth 2 (two) times daily with a meal.   metoprolol tartrate (LOPRESSOR) 25 MG tablet Take 1 tablet by mouth twice daily   Multiple Vitamin (MULTI-VITAMIN DAILY PO) Take 1 capsule by mouth 2 (two) times daily. Name: Life Extensions   Omega-3 Fatty Acids (FISH OIL) 1000 MG CAPS Take 1 capsule by mouth 2 (two) times daily.   Risankizumab-rzaa (SKYRIZI PEN) 150 MG/ML SOAJ Inject 150 mg into the skin as directed. Every 12 weeks for maintenance.  rosuvastatin (CRESTOR) 10 MG tablet Take 1 tablet (10 mg total) by mouth daily.   sildenafil (REVATIO) 20 MG tablet TAKE 1 TABLET BY MOUTH ONCE DAILY AS NEEDED   SKYRIZI 150 MG/ML SOSY INJECT 150MG  SUBCUTANEOUSLY EVERY 12 WEEKS AS DIRECTED.   SYRINGE-NEEDLE, DISP, 3 ML 21G X 1-1/2" 3 ML MISC Use to inject testosterone every week   testosterone cypionate (DEPOTESTOSTERONE CYPIONATE) 200 MG/ML injection Inject 0.5 mLs (100 mg total) into the muscle every 14 (fourteen) days.    traZODone (DESYREL) 100 MG tablet Take 1 tablet (100 mg total) by mouth at bedtime as needed for sleep.   No facility-administered encounter medications on file as of 03/17/2023.    Recent Results (from the past 2160 hour(s))  Testosterone, Free, Total, SHBG     Status: None   Collection Time: 01/24/23  9:12 AM  Result Value Ref Range   Testosterone 615 264 - 916 ng/dL    Comment: Adult male reference interval is based on a population of healthy nonobese males (BMI <30) between 73 and 28 years old. Travison, et.al. JCEM [phone]. PMID: [id number].    Testosterone, Free 10.9 7.2 - 24.0 pg/mL   Sex Hormone Binding 25.6 19.3 - 76.4 nmol/L  PSA     Status: None   Collection Time: 01/24/23  9:12 AM  Result Value Ref Range   Prostate Specific Ag, Serum 0.4 0.0 - 4.0 ng/mL    Comment: Roche ECLIA methodology. According to the American Urological Association, Serum PSA should decrease and remain at undetectable levels after radical prostatectomy. The AUA defines biochemical recurrence as an initial PSA value 0.2 ng/mL or greater followed by a subsequent confirmatory PSA value 0.2 ng/mL or greater. Values obtained with different assay methods or kits cannot be used interchangeably. Results cannot be interpreted as absolute evidence of the presence or absence of malignant disease.   CMP14+EGFR     Status: None   Collection Time: 02/19/23  9:31 AM  Result Value Ref Range   Glucose 89 70 - 99 mg/dL   BUN 10 6 - 24 mg/dL   Creatinine, Ser 8.46 0.76 - 1.27 mg/dL   eGFR 84 >96 EX/BMW/4.13   BUN/Creatinine Ratio 9 9 - 20   Sodium 138 134 - 144 mmol/L   Potassium 4.5 3.5 - 5.2 mmol/L   Chloride 100 96 - 106 mmol/L   CO2 27 20 - 29 mmol/L   Calcium 9.6 8.7 - 10.2 mg/dL   Total Protein 7.1 6.0 - 8.5 g/dL   Albumin 4.7 3.8 - 4.9 g/dL   Globulin, Total 2.4 1.5 - 4.5 g/dL   Albumin/Globulin Ratio 2.0 1.2 - 2.2   Bilirubin Total 0.6 0.0 - 1.2 mg/dL   Alkaline Phosphatase 53 44 - 121  IU/L   AST 21 0 - 40 IU/L   ALT 25 0 - 44 IU/L  Lipid panel     Status: Abnormal   Collection Time: 02/19/23  9:31 AM  Result Value Ref Range   Cholesterol, Total 137 100 - 199 mg/dL   Triglycerides 244 (H) 0 - 149 mg/dL   HDL 28 (L) >01 mg/dL   VLDL Cholesterol Cal 51 (H) 5 - 40 mg/dL   LDL Chol Calc (NIH) 58 0 - 99 mg/dL   Chol/HDL Ratio 4.9 0.0 - 5.0 ratio    Comment:  T. Chol/HDL Ratio                                             Men  Women                               1/2 Avg.Risk  3.4    3.3                                   Avg.Risk  5.0    4.4                                2X Avg.Risk  9.6    7.1                                3X Avg.Risk 23.4   11.0   Bayer DCA Hb A1c Waived     Status: Abnormal   Collection Time: 02/19/23  9:33 AM  Result Value Ref Range   HB A1C (BAYER DCA - WAIVED) 5.8 (H) 4.8 - 5.6 %    Comment:          Prediabetes: 5.7 - 6.4          Diabetes: >6.4          Glycemic control for adults with diabetes: <7.0   CBC with Differential/Platelet     Status: Abnormal   Collection Time: 02/19/23  9:34 AM  Result Value Ref Range   WBC 6.8 3.4 - 10.8 x10E3/uL   RBC 5.84 (H) 4.14 - 5.80 x10E6/uL   Hemoglobin 17.8 (H) 13.0 - 17.7 g/dL   Hematocrit 16.1 (H) 09.6 - 51.0 %   MCV 90 79 - 97 fL   MCH 30.5 26.6 - 33.0 pg   MCHC 34.0 31.5 - 35.7 g/dL   RDW 04.5 40.9 - 81.1 %   Platelets 239 150 - 450 x10E3/uL   Neutrophils 44 Not Estab. %   Lymphs 42 Not Estab. %   Monocytes 10 Not Estab. %   Eos 3 Not Estab. %   Basos 1 Not Estab. %   Neutrophils Absolute 3.0 1.4 - 7.0 x10E3/uL   Lymphocytes Absolute 2.9 0.7 - 3.1 x10E3/uL   Monocytes Absolute 0.7 0.1 - 0.9 x10E3/uL   EOS (ABSOLUTE) 0.2 0.0 - 0.4 x10E3/uL   Basophils Absolute 0.1 0.0 - 0.2 x10E3/uL   Immature Granulocytes 0 Not Estab. %   Immature Grans (Abs) 0.0 0.0 - 0.1 x10E3/uL     Psychiatric Specialty Exam: Physical Exam  Review of Systems   Psychiatric/Behavioral:  Positive for dysphoric mood. The patient is nervous/anxious.     Weight 265 lb (120.2 kg).There is no height or weight on file to calculate BMI.  General Appearance: NA  Eye Contact:  NA  Speech:  Slow  Volume:  Decreased  Mood:  Anxious and Dysphoric  Affect:  NA  Thought Process:  Goal Directed  Orientation:  Full (Time, Place, and Person)  Thought Content:  Rumination  Suicidal Thoughts:  No  Homicidal Thoughts:  No  Memory:  Immediate;   Good Recent;   Good Remote;  Good  Judgement:  Intact  Insight:  Present  Psychomotor Activity:  NA  Concentration:  Concentration: Good and Attention Span: Good  Recall:  Good  Fund of Knowledge:  Good  Language:  Good  Akathisia:  No  Handed:  Right  AIMS (if indicated):     Assets:  Communication Skills Desire for Improvement Housing Social Support Talents/Skills Transportation  ADL's:  Intact  Cognition:  WNL  Sleep:  ok     Assessment/Plan: Social anxiety disorder - Plan: LORazepam (ATIVAN) 0.5 MG tablet, desvenlafaxine (PRISTIQ) 100 MG 24 hr tablet, traZODone (DESYREL) 100 MG tablet, vortioxetine HBr (TRINTELLIX) 10 MG TABS tablet  Panic attacks - Plan: LORazepam (ATIVAN) 0.5 MG tablet, vortioxetine HBr (TRINTELLIX) 10 MG TABS tablet  Moderate episode of recurrent major depressive disorder (HCC) - Plan: desvenlafaxine (PRISTIQ) 100 MG 24 hr tablet, vortioxetine HBr (TRINTELLIX) 10 MG TABS tablet  Review symptoms.  He has few strong panic attacks when he was thinking to call 911 but able to calm himself.  He recalled these attacks are mostly having breathing issues, chest tightness but no sweating or pain in his chest.  Discussed Trintellix to consider since he has never tried before.  He agreed to give a try.  We will start the Trintellix 5 mg daily for 1 week and then 10 mg.  We will cut down his Pristiq to 50 mg for 1 week and then he will discontinued.  Discussed possible withdrawal from Pristiq  which he also suffered when he was coming off from Anafranil.  He did he do every other day medication to avoid withdrawals.  We also talked about consider therapy and patient agreed to look into it if Trintellix did not work.  Patient will pick up the samples of Trintellix and I also called to his insurance.  Continue Ativan 0.5 mg daily as needed.  Continue trazodone 100 mg daily.  Follow up in 4 weeks.  Follow Up Instructions:     I discussed the assessment and treatment plan with the patient. The patient was provided an opportunity to ask questions and all were answered. The patient agreed with the plan and demonstrated an understanding of the instructions.   The patient was advised to call back or seek an in-person evaluation if the symptoms worsen or if the condition fails to improve as anticipated.    Collaboration of Care: Other provider involved in patient's care AEB 's are available in epic to review.  Patient/Guardian was advised Release of Information must be obtained prior to any record release in order to collaborate their care with an outside provider. Patient/Guardian was advised if they have not already done so to contact the registration department to sign all necessary forms in order for [place] to release information regarding their care.   Consent: Patient/Guardian gives verbal consent for treatment and assignment of benefits for services provided during this visit. Patient/Guardian expressed understanding and agreed to proceed.     I provided 26 minutes of non face to face time during this encounter.  Note: This document was prepared by [employer] voice dictation technology and any errors that results from this process are unintentional.    Cleotis Nipper, MD 03/17/2023

## 2023-03-31 ENCOUNTER — Telehealth (HOSPITAL_COMMUNITY): Payer: Self-pay | Admitting: *Deleted

## 2023-03-31 ENCOUNTER — Other Ambulatory Visit (HOSPITAL_COMMUNITY): Payer: Self-pay | Admitting: *Deleted

## 2023-03-31 DIAGNOSIS — F331 Major depressive disorder, recurrent, moderate: Secondary | ICD-10-CM

## 2023-03-31 MED ORDER — CLOMIPRAMINE HCL 25 MG PO CAPS
50.0000 mg | ORAL_CAPSULE | Freq: Every day | ORAL | 0 refills | Status: DC
Start: 2023-03-31 — End: 2023-04-14

## 2023-03-31 NOTE — Telephone Encounter (Signed)
Pt called with c/o increased lability which he feels is r/t taking Trintelllix 10 mg and discontinuing the Anafranil 50 mg. Pt would like to d/c Trintellix and restsart the Anafranil 50 mg. Pt also requests refill of Ativan 0.5 mg 1 every day PRN; last e-scribed 03/17/23 #30. Pt has an appointment scheduled for 04/14/23. Please review and advise.

## 2023-03-31 NOTE — Telephone Encounter (Signed)
LVM for pt advising.

## 2023-03-31 NOTE — Telephone Encounter (Signed)
Okay, he can start the Anafranil 50 mg daily.  Discontinue Trintellix.  He is not due for Ativan yet.  Please call Anafranil to his pharmacy.

## 2023-04-11 ENCOUNTER — Ambulatory Visit: Payer: 59 | Admitting: Family

## 2023-04-11 ENCOUNTER — Encounter: Payer: Self-pay | Admitting: Family

## 2023-04-11 VITALS — BP 121/81 | HR 66 | Temp 97.5°F | Ht 74.0 in | Wt 268.8 lb

## 2023-04-11 DIAGNOSIS — L731 Pseudofolliculitis barbae: Secondary | ICD-10-CM | POA: Diagnosis not present

## 2023-04-11 NOTE — Patient Instructions (Signed)
Ingrown Hair  An ingrown hair is a hair that curls and re-enters the skin instead of growing straight out of the skin. An ingrown hair can develop in any part of the skin that hair is removed from. An ingrown hair may cause small pockets of infection. What are the causes? An ingrown hair may be caused by: Shaving. Tweezing. Waxing. Using a hair removal cream. What increases the risk? You are more likely to develop this condition if you have curly hair. What are the signs or symptoms? Symptoms of an ingrown hair may include: Small bumps on the skin. The bumps may be filled with pus. Pain. Itching. How is this diagnosed? An ingrown hair is diagnosed by a skin exam. How is this treated? Treatment is often not needed unless the ingrown hair has caused an infection. If needed, treatment may include: Applying prescription creams to the skin. This can help with inflammation. Applying warm compresses to the skin. This can help soften the skin. Taking antibiotic medicine. An antibiotic may be prescribed if the infection is severe. Retracting and releasing the ingrown hair tips. Hair removal by electrolysis or laser. Follow these instructions at home: Medicines Take, apply, or use over-the-counter and prescription medicines only as told by your health care provider. This includes any prescription creams. If you were prescribed an antibiotic medicine, take it as told by your health care provider. Do not stop using the antibiotic even if you start to feel better. General instructions Do not shave irritated areas of skin. You may start shaving these areas again once the irritation has gone away. To help remove ingrown hairs on your face, you may use a facial sponge in a gentle circular motion. Do not pick or squeeze the irritated area of skin as this may cause infection and scarring. Use a hair removal cream as told by your health care provider. Managing pain and swelling  If directed, apply  heat to the affected area as often as told by your health care provider. Use the heat source that your health care provider recommends, such as a moist heat pack or a heating pad. Place a towel between your skin and the heat source. Leave the heat on for 20-30 minutes. If your skin turns bright red, remove the heat right away to prevent burns. The risk of burns is higher if you cannot feel pain, heat, or cold. How is this prevented? Shower before shaving. Wrap areas that you are going to shave in warm, moist wraps for several minutes before shaving. The warmth and moisture help to soften the hairs and makes ingrown hairs less likely. Use thick shaving gels. Use a razor that cuts hair slightly above your skin, or use an electric shaver with a long shave setting. Shave in the direction of hair growth. Avoid making multiple razor strokes. Apply a moisturizing lotion after shaving. Summary An ingrown hair is a hair that curls and re-enters the skin instead of growing straight out of the skin. Treatment is often not needed unless the ingrown hair has caused an infection. Take, apply, or use over-the-counter and prescription medicines only as told by your health care provider. This includes any prescription creams. Do not shave irritated areas of skin. You may start shaving these areas again once the irritation has gone away. If directed, apply heat to the affected area. Use the heat source that your health care provider recommends, such as a moist heat pack or a heating pad. This information is not intended to replace  advice given to you by your health care provider. Make sure you discuss any questions you have with your health care provider. Document Revised: 11/07/2021 Document Reviewed: 11/07/2021 Elsevier Patient Education  2024 [employer].

## 2023-04-11 NOTE — Progress Notes (Signed)
   Subjective:    Patient ID: Paul Parrish, male    DOB: [birth year], 52 y.o.   MRN: [id number]  Chief Complaint  Patient presents with   Mass    Near private area    PT presents to the office today with a "lump" in his right groin that he noticed June. Reports it improved and was much smaller, however, the last 4 days he noticed it was enlarged again. Denies any fever, injury, dysuria, penile discharge, or testicular swelling.  Reports mild 3 out 10 aching pain when he pushes on the area.  HPI    Review of Systems  All other systems reviewed and are negative.      Objective:   Physical Exam Vitals reviewed.  Constitutional:      General: He is not in acute distress.    Appearance: He is well-developed.  HENT:     Head: Normocephalic.  Eyes:     General:        Right eye: No discharge.        Left eye: No discharge.     Pupils: Pupils are equal, round, and reactive to light.  Neck:     Thyroid: No thyromegaly.  Cardiovascular:     Rate and Rhythm: Normal rate and regular rhythm.     Heart sounds: Normal heart sounds. No murmur heard. Pulmonary:     Effort: Pulmonary effort is normal. No respiratory distress.     Breath sounds: Normal breath sounds. No wheezing.  Abdominal:     General: Bowel sounds are normal. There is no distension.     Palpations: Abdomen is soft.     Tenderness: There is no abdominal tenderness.  Musculoskeletal:        General: No tenderness. Normal range of motion.     Cervical back: Normal range of motion and neck supple.  Skin:    General: Skin is warm and dry.     Findings: No erythema or rash.     Comments: Quarter size soft cyst  Neurological:     Mental Status: He is alert and oriented to person, place, and time.     Cranial Nerves: No cranial nerve deficit.     Deep Tendon Reflexes: Reflexes are normal and symmetric.  Psychiatric:        Behavior: Behavior normal.        Thought Content: Thought content normal.        Judgment:  Judgment normal.     Small incision made, small amount sanguineous discharge. No purulent discharge.    BP 121/81   Pulse 66   Temp (!) 97.5 F (36.4 C) (Temporal)   Ht 6\' 2"  (1.88 m)   Wt 268 lb 12.8 oz (121.9 kg)   SpO2 99%   BMI 34.51 kg/m      Assessment & Plan:  Paul Parrish comes in today with chief complaint of Mass (Near private area )   Diagnosis and orders addressed:  1. Ingrown hair Drained in office, no s/s of infection Pressure dressing applied Keep clean and dry Report any fever, discharge, or increased pain Follow up as needed   Jannifer Rodney, FNP

## 2023-04-14 ENCOUNTER — Telehealth (HOSPITAL_BASED_OUTPATIENT_CLINIC_OR_DEPARTMENT_OTHER): Payer: 59 | Admitting: Psychiatry

## 2023-04-14 ENCOUNTER — Encounter (HOSPITAL_COMMUNITY): Payer: Self-pay | Admitting: Psychiatry

## 2023-04-14 DIAGNOSIS — F331 Major depressive disorder, recurrent, moderate: Secondary | ICD-10-CM | POA: Diagnosis not present

## 2023-04-14 DIAGNOSIS — F410 Panic disorder [episodic paroxysmal anxiety] without agoraphobia: Secondary | ICD-10-CM | POA: Diagnosis not present

## 2023-04-14 DIAGNOSIS — F4010 Social phobia, unspecified: Secondary | ICD-10-CM

## 2023-04-14 MED ORDER — CLOMIPRAMINE HCL 25 MG PO CAPS
50.0000 mg | ORAL_CAPSULE | Freq: Every day | ORAL | 2 refills | Status: DC
Start: 2023-04-14 — End: 2023-07-14

## 2023-04-14 MED ORDER — LORAZEPAM 0.5 MG PO TABS
ORAL_TABLET | ORAL | 2 refills | Status: DC
Start: 2023-04-14 — End: 2023-07-14

## 2023-04-14 MED ORDER — TRAZODONE HCL 100 MG PO TABS
100.0000 mg | ORAL_TABLET | Freq: Every evening | ORAL | 0 refills | Status: DC | PRN
Start: 2023-04-14 — End: 2023-06-25

## 2023-04-14 NOTE — Progress Notes (Signed)
[facility] MD Virtual Progress Note   Patient Location: Home Provider Location: Home Office  I connect with patient by telephone and verified that I am speaking with correct person by using two identifiers. I discussed the limitations of evaluation and management by telemedicine and the availability of in person appointments. I also discussed with the patient that there may be a patient responsible charge related to this service. The patient expressed understanding and agreed to proceed.  Paul Parrish [id number] y.o.  04/14/2023 4:15 PM  History of Present Illness:  Patient is evaluated by phone session.  He started taking Anafranil again 50 mg since Trintellix did keep mood swings.  He also stopped taking the Pristiq which he was supposed to do after starting the new medication.  He still feels anxious and nervous but failed so far Anafranil is working better than any other medication.  He admitted taking the Ativan more frequently at least once a day to help his anxiety.  He sleeps good with the help of trazodone.  He denies any mania, psychosis, hallucination.  He had tried numerous psychotropic medication and also he did ECT with limited response.  Denies drinking or using any illegal substances.  His job is going okay.  He has no tremors, shakes or any EPS.  He denies any paranoia or any suicidal thoughts.  At this time would like to keep his current medication.  Past Psychiatric History: H/O anxiety and depression. Saw provider at neuropsychiatry in [place] but not happy with the treatment.  Saw Dr. Rene Kocher in 2018 and prescribed Prozac, Xanax, Effexor, Celexa, propanolol, Lamictal, Vistaril, Adderall, Wellbutrin,  Cymbalta (increased irritability ), Anafranil, Pristiq,  Trintellix and Luvox (did not help). H/O of ECT with no response.  Had genetic testing in 2019.  Paxil, Lexapro, Celexa and Tegretol had significant drug interaction.  No h/o suicidal attempt or inpatient.  At [facility] tried [place], Klonopin but did not work. H/O heavy drinking but claims to be sober.        Outpatient Encounter Medications as of 04/14/2023  Medication Sig   clomiPRAMINE (ANAFRANIL) 25 MG capsule Take 2 capsules (50 mg total) by mouth at bedtime.   LORazepam (ATIVAN) 0.5 MG tablet Take one tab daily as needed for anxiety   Melatonin 10 MG TABS Take 1 tablet by mouth at bedtime.   metoprolol tartrate (LOPRESSOR) 25 MG tablet Take 1 tablet by mouth twice daily   Multiple Vitamin (MULTI-VITAMIN DAILY PO) Take 1 capsule by mouth 2 (two) times daily. Name: Life Extensions   Omega-3 Fatty Acids (FISH OIL) 1000 MG CAPS Take 1 capsule by mouth 2 (two) times daily.   Risankizumab-rzaa (SKYRIZI PEN) 150 MG/ML SOAJ Inject 150 mg into the skin as directed. Every 12 weeks for maintenance.   rosuvastatin (CRESTOR) 10 MG tablet Take 1 tablet (10 mg total) by mouth daily. (Patient not taking: Reported on 04/11/2023)   sildenafil (REVATIO) 20 MG tablet TAKE 1 TABLET BY MOUTH ONCE DAILY AS NEEDED   SKYRIZI 150 MG/ML SOSY INJECT 150MG  SUBCUTANEOUSLY EVERY 12 WEEKS AS DIRECTED.   SYRINGE-NEEDLE, DISP, 3 ML 21G X 1-1/2" 3 ML MISC Use to inject testosterone every week (Patient not taking: Reported on 04/11/2023)   testosterone cypionate (DEPOTESTOSTERONE CYPIONATE) 200 MG/ML injection Inject 0.5 mLs (100 mg total) into the muscle every 14 (fourteen) days.   traZODone (DESYREL) 100 MG tablet Take 1 tablet (100 mg total) by mouth at bedtime as needed for sleep.   No facility-administered  encounter medications on file as of 04/14/2023.    Recent Results (from the past 2160 hour(s))  Testosterone, Free, Total, SHBG     Status: None   Collection Time: 01/24/23  9:12 AM  Result Value Ref Range   Testosterone 615 264 - 916 ng/dL    Comment: Adult male reference interval is based on a population of healthy nonobese males (BMI <30) between 59 and 62 years old. Travison, et.al. JCEM [phone].  PMID: 91478295.    Testosterone, Free 10.9 7.2 - 24.0 pg/mL   Sex Hormone Binding 25.6 19.3 - 76.4 nmol/L  PSA     Status: None   Collection Time: 01/24/23  9:12 AM  Result Value Ref Range   Prostate Specific Ag, Serum 0.4 0.0 - 4.0 ng/mL    Comment: Roche ECLIA methodology. According to the American Urological Association, Serum PSA should decrease and remain at undetectable levels after radical prostatectomy. The AUA defines biochemical recurrence as an initial PSA value 0.2 ng/mL or greater followed by a subsequent confirmatory PSA value 0.2 ng/mL or greater. Values obtained with different assay methods or kits cannot be used interchangeably. Results cannot be interpreted as absolute evidence of the presence or absence of malignant disease.   CMP14+EGFR     Status: None   Collection Time: 02/19/23  9:31 AM  Result Value Ref Range   Glucose 89 70 - 99 mg/dL   BUN 10 6 - 24 mg/dL   Creatinine, Ser 6.21 0.76 - 1.27 mg/dL   eGFR 84 >30 QM/VHQ/4.69   BUN/Creatinine Ratio 9 9 - 20   Sodium 138 134 - 144 mmol/L   Potassium 4.5 3.5 - 5.2 mmol/L   Chloride 100 96 - 106 mmol/L   CO2 27 20 - 29 mmol/L   Calcium 9.6 8.7 - 10.2 mg/dL   Total Protein 7.1 6.0 - 8.5 g/dL   Albumin 4.7 3.8 - 4.9 g/dL   Globulin, Total 2.4 1.5 - 4.5 g/dL   Albumin/Globulin Ratio 2.0 1.2 - 2.2   Bilirubin Total 0.6 0.0 - 1.2 mg/dL   Alkaline Phosphatase 53 44 - 121 IU/L   AST 21 0 - 40 IU/L   ALT 25 0 - 44 IU/L  Lipid panel     Status: Abnormal   Collection Time: 02/19/23  9:31 AM  Result Value Ref Range   Cholesterol, Total 137 100 - 199 mg/dL   Triglycerides 629 (H) 0 - 149 mg/dL   HDL 28 (L) >52 mg/dL   VLDL Cholesterol Cal 51 (H) 5 - 40 mg/dL   LDL Chol Calc (NIH) 58 0 - 99 mg/dL   Chol/HDL Ratio 4.9 0.0 - 5.0 ratio    Comment:                                   T. Chol/HDL Ratio                                             Men  Women                               1/2 Avg.Risk  3.4    3.3  Avg.Risk  5.0    4.4                                2X Avg.Risk  9.6    7.1                                3X Avg.Risk 23.4   11.0   Bayer DCA Hb A1c Waived     Status: Abnormal   Collection Time: 02/19/23  9:33 AM  Result Value Ref Range   HB A1C (BAYER DCA - WAIVED) 5.8 (H) 4.8 - 5.6 %    Comment:          Prediabetes: 5.7 - 6.4          Diabetes: >6.4          Glycemic control for adults with diabetes: <7.0   CBC with Differential/Platelet     Status: Abnormal   Collection Time: 02/19/23  9:34 AM  Result Value Ref Range   WBC 6.8 3.4 - 10.8 x10E3/uL   RBC 5.84 (H) 4.14 - 5.80 x10E6/uL   Hemoglobin 17.8 (H) 13.0 - 17.7 g/dL   Hematocrit 34.7 (H) 42.5 - 51.0 %   MCV 90 79 - 97 fL   MCH 30.5 26.6 - 33.0 pg   MCHC 34.0 31.5 - 35.7 g/dL   RDW 95.6 38.7 - 56.4 %   Platelets 239 150 - 450 x10E3/uL   Neutrophils 44 Not Estab. %   Lymphs 42 Not Estab. %   Monocytes 10 Not Estab. %   Eos 3 Not Estab. %   Basos 1 Not Estab. %   Neutrophils Absolute 3.0 1.4 - 7.0 x10E3/uL   Lymphocytes Absolute 2.9 0.7 - 3.1 x10E3/uL   Monocytes Absolute 0.7 0.1 - 0.9 x10E3/uL   EOS (ABSOLUTE) 0.2 0.0 - 0.4 x10E3/uL   Basophils Absolute 0.1 0.0 - 0.2 x10E3/uL   Immature Granulocytes 0 Not Estab. %   Immature Grans (Abs) 0.0 0.0 - 0.1 x10E3/uL     Psychiatric Specialty Exam: Physical Exam  Review of Systems  Weight 268 lb (121.6 kg).There is no height or weight on file to calculate BMI.  General Appearance: NA  Eye Contact:  NA  Speech:  Clear and Coherent  Volume:  Normal  Mood:  Anxious  Affect:  NA  Thought Process:  Goal Directed  Orientation:  Full (Time, Place, and Person)  Thought Content:  WDL  Suicidal Thoughts:  No  Homicidal Thoughts:  No  Memory:  Immediate;   Good Recent;   Good Remote;   Good  Judgement:  Good  Insight:  Present  Psychomotor Activity:  NA  Concentration:  Concentration: Good and Attention Span: Good  Recall:  Good  Fund of  Knowledge:  Good  Language:  Good  Akathisia:  No  Handed:  Right  AIMS (if indicated):     Assets:  Communication Skills Desire for Improvement Housing Resilience Social Support Talents/Skills Transportation  ADL's:  Intact  Cognition:  WNL  Sleep: Okay okay     Assessment/Plan: Moderate episode of recurrent major depressive disorder (HCC) - Plan: clomiPRAMINE (ANAFRANIL) 25 MG capsule  Social anxiety disorder - Plan: LORazepam (ATIVAN) 0.5 MG tablet, traZODone (DESYREL) 100 MG tablet  Panic attacks - Plan: LORazepam (ATIVAN) 0.5 MG tablet  Discontinue Trintellix as patient having mood swings.  Patient back on  Anafranil 50 mg daily along with trazodone 100 mg at bedtime and Ativan 0.5 mg as needed.  He is concerned about anxiety as sometime he need to take the Ativan every day.  I reviewed the results, other than Valium no anxiolytic medicine has significant drug interaction.  He will Valium as moderate drug interaction.  However he had tried Klonopin in the past that did not work.  For now continue Ativan however if noticed that is not working we may consider switching to Tranxene or Serax if needed.  Encouraged to call us back if there is any worsening of symptoms.  Follow-up in 3 months.   Follow Up Instructions:     I discussed the assessment and treatment plan with the patient. The patient was provided an opportunity to ask questions and all were answered. The patient agreed with the plan and demonstrated an understanding of the instructions.   The patient was advised to call back or seek an in-person evaluation if the symptoms worsen or if the condition fails to improve as anticipated.    Collaboration of Care: Other provider involved in patient's care AEB notes are available in epic to review.  Patient/Guardian was advised Release of Information must be obtained prior to any record release in order to collaborate their care with an outside provider. Patient/Guardian was  advised if they have not already done so to contact the registration department to sign all necessary forms in order for [place] to release information regarding their care.   Consent: Patient/Guardian gives verbal consent for treatment and assignment of benefits for services provided during this visit. Patient/Guardian expressed understanding and agreed to proceed.     I provided 19 minutes of non face to face time during this encounter.  Note: This document was prepared by [employer] voice dictation technology and any errors that results from this process are unintentional.    Cleotis Nipper, MD 04/14/2023

## 2023-05-05 ENCOUNTER — Other Ambulatory Visit: Payer: Self-pay | Admitting: Family Medicine

## 2023-05-22 ENCOUNTER — Telehealth (HOSPITAL_COMMUNITY): Payer: Self-pay | Admitting: *Deleted

## 2023-05-22 NOTE — Telephone Encounter (Signed)
Pt called to ask if another antidepressant can be added to the Anafranil? Pt states he has dealing with amotivation primarily as well as some depression. Pt last appointment 04/14/23, when Trintellix was d/c. Pt denies SI. Pt next appointment is scheduled for 07/14/23. Please review and advise.

## 2023-05-23 NOTE — Telephone Encounter (Signed)
He did tried multiple medication but it did not help.  I will discuss with him on his next appointment.

## 2023-05-27 ENCOUNTER — Telehealth (HOSPITAL_COMMUNITY): Payer: Self-pay

## 2023-05-27 NOTE — Telephone Encounter (Signed)
Patient is calling to report his depression is worse, he wants to know if there is a medication that you can add on to help. Patient has a follow up 10/14. Please review and advise, thank you

## 2023-05-27 NOTE — Telephone Encounter (Signed)
If he had not tried Abilify in the past then please call 2 mg Abilify to his pharmacy.

## 2023-05-28 ENCOUNTER — Other Ambulatory Visit (HOSPITAL_COMMUNITY): Payer: Self-pay

## 2023-05-28 MED ORDER — ARIPIPRAZOLE 2 MG PO TABS: 2.0000 mg | ORAL_TABLET | Freq: Every day | ORAL | 0 refills | Status: DC

## 2023-05-29 ENCOUNTER — Other Ambulatory Visit: Payer: 59

## 2023-05-31 LAB — CBC WITH DIFFERENTIAL/PLATELET: Basophils Absolute: 0.1 10*3/uL (ref 0.0–0.2)

## 2023-06-03 ENCOUNTER — Encounter: Payer: Self-pay | Admitting: "Endocrinology

## 2023-06-03 ENCOUNTER — Ambulatory Visit: Payer: 59 | Admitting: "Endocrinology

## 2023-06-03 VITALS — BP 126/84 | HR 72 | Ht 74.0 in | Wt 268.2 lb

## 2023-06-03 DIAGNOSIS — E291 Testicular hypofunction: Secondary | ICD-10-CM

## 2023-06-03 MED ORDER — TESTOSTERONE CYPIONATE 200 MG/ML IM SOLN: INTRAMUSCULAR | 0 refills | Status: DC

## 2023-06-03 MED ORDER — "SYRINGE/NEEDLE (DISP) 21G X 1-1/2"" 3 ML MISC": 1 refills | Status: DC

## 2023-06-03 NOTE — Progress Notes (Signed)
06/03/2023      Endocrinology follow-up note   HPI: Paul Parrish is a 52 y.o.-year-old man.  - He is being seen in follow-up for  hypogonadism.  He was diagnosed with hypogonadism in 2014, etiology unclear.   -Due to polycythemia, he undergoes phlebotomy periodically.  He is currently on testosterone 100 mg every 14 days.  His previsit labs show mild erythrocytosis.  Patient is already scheduled for phlebotomy.    Patient wishes to continue on testosterone treatment.  He reports improvement in his symptoms including fatigue and low libido.    -He reports history of seizures as a young man until his teenage years. -He denies any history of head injury.  - He also is recently diagnosed with sleep apnea currently on CPAP.  - He fathers 2 teenage boys biologically. - He has significant mood disorder on multiple antidepressants and antianxiety. No herbal medicines. -He denies family history of premature coronary artery disease.   ROS: Limited as above.  PE: BP 126/84   Pulse 72   Ht 6\' 2"  (1.88 m)   Wt 268 lb 3.2 oz (121.7 kg)   BMI 34.43 kg/m  Wt Readings from Last 3 Encounters:  06/03/23 268 lb 3.2 oz (121.7 kg)  04/11/23 268 lb 12.8 oz (121.9 kg)  02/20/23 265 lb 6.4 oz (120.4 kg)     Recent Results (from the past 2160 hour(s))  CBC with Differential/Platelet     Status: Abnormal   Collection Time: 05/29/23  9:41 AM  Result Value Ref Range   WBC 9.2 3.4 - 10.8 x10E3/uL   RBC 6.07 (H) 4.14 - 5.80 x10E6/uL   Hemoglobin 18.9 (H) 13.0 - 17.7 g/dL   Hematocrit 16.1 (H) 09.6 - 51.0 %   MCV 89 79 - 97 fL   MCH 31.1 26.6 - 33.0 pg   MCHC 35.2 31.5 - 35.7 g/dL   RDW 04.5 40.9 - 81.1 %   Platelets 246 150 - 450 x10E3/uL   Neutrophils 63 Not Estab. %   Lymphs 22 Not Estab. %   Monocytes 13 Not Estab. %   Eos 1 Not Estab. %   Basos 1 Not Estab. %   Neutrophils Absolute 5.8 1.4 - 7.0 x10E3/uL   Lymphocytes Absolute 2.0 0.7 - 3.1 x10E3/uL   Monocytes Absolute 1.2 (H) 0.1 -  0.9 x10E3/uL   EOS (ABSOLUTE) 0.1 0.0 - 0.4 x10E3/uL   Basophils Absolute 0.1 0.0 - 0.2 x10E3/uL   Immature Granulocytes 0 Not Estab. %   Immature Grans (Abs) 0.0 0.0 - 0.1 x10E3/uL  Testosterone, Free, Total, SHBG     Status: Abnormal   Collection Time: 05/29/23  9:41 AM  Result Value Ref Range   Testosterone 364 264 - 916 ng/dL    Comment: Adult male reference interval is based on a population of healthy nonobese males (BMI <30) between 49 and 26 years old. Travison, et.al. JCEM [phone]. PMID: 57846962.    Testosterone, Free 7.0 (L) 7.2 - 24.0 pg/mL   Sex Hormone Binding 22.8 19.3 - 76.4 nmol/L      ASSESSMENT: 1.  Hypogonadism  2. Erectile Dysfunction 3.  Polycythemia 4.  Prediabetes  PLAN:  1. Hypogonadism  -He did phlebotomy at least once since his last visit.  His recent labs are showing erythrocytosis with hemoglobin at 18.9 and hematocrit at 53.7.  He is encouraged to continue his plan on elective phlebotomy.  He is also advised to lower his testosterone to testosterone 50 50 mg every 10 days .  His history of  secondary polycythemia which continues to require periodic phlebotomy, as well as sleep apnea requiring CPAP treatment making him a poor candidate for more generous testosterone replacement. Regarding his weight concern: His insurance did not provide coverage for injectable weight loss medications. He remains a good fit for lifestyle medicine.  - he acknowledges that there is a room for improvement in his food and drink choices. - Suggestion is made for him to avoid simple carbohydrates  from his diet including Cakes, Sweet Desserts, Ice Cream, Soda (diet and regular), Sweet Tea, Candies, Chips, Cookies, Store Bought Juices, Alcohol , Artificial Sweeteners,  Coffee Creamer, and "Sugar-free" Products, Lemonade. This will help patient to have more stable blood glucose profile and potentially avoid unintended weight gain.  The following Lifestyle Medicine  recommendations according to American College of Lifestyle Medicine  [facility]) were discussed and and offered to patient and he  agrees to start the journey:  A. Whole Foods, Plant-Based Nutrition comprising of fruits and vegetables, plant-based proteins, whole-grain carbohydrates was discussed in detail with the patient.   A list for source of those nutrients were also provided to the patient.  Patient will use only water or unsweetened tea for hydration. B.  The need to stay away from risky substances including alcohol, smoking; obtaining 7 to 9 hours of restorative sleep, at least 150 minutes of moderate intensity exercise weekly, the importance of healthy social connections,  and stress management techniques were discussed. C.  A full color page of  Calorie density of various food groups per pound showing examples of each food groups was provided to the patient. Good night see tomorrow   He is advised to continue close follow-up with his PMD for primary care needs.    I spent  21  minutes in the care of the patient today including review of labs from Thyroid Function, CMP, and other relevant labs ; imaging/biopsy records (current and previous including abstractions from other facilities); face-to-face time discussing  his lab results and symptoms, medications doses, his options of short and long term treatment based on the latest standards of care / guidelines;   and documenting the encounter.  Nolene Bernheim  participated in the discussions, expressed understanding, and voiced agreement with the above plans.  All questions were answered to his satisfaction. he is encouraged to contact clinic should he have any questions or concerns prior to his return visit.   Marquis Lunch, MD Phone: [phone]  Fax: [phone]  -  This note was partially dictated with voice recognition software. Similar sounding words can be transcribed inadequately or may not  be corrected upon review.  06/03/2023, 4:59 PM

## 2023-06-24 ENCOUNTER — Other Ambulatory Visit (HOSPITAL_COMMUNITY): Payer: Self-pay | Admitting: Psychiatry

## 2023-06-25 ENCOUNTER — Other Ambulatory Visit (HOSPITAL_COMMUNITY): Payer: Self-pay

## 2023-06-25 DIAGNOSIS — F4010 Social phobia, unspecified: Secondary | ICD-10-CM

## 2023-06-25 MED ORDER — TRAZODONE HCL 100 MG PO TABS
100.0000 mg | ORAL_TABLET | Freq: Every evening | ORAL | 0 refills | Status: DC | PRN
Start: 2023-06-25 — End: 2023-07-14

## 2023-06-26 ENCOUNTER — Other Ambulatory Visit (HOSPITAL_COMMUNITY): Payer: Self-pay

## 2023-06-26 MED ORDER — ARIPIPRAZOLE 2 MG PO TABS: 2.0000 mg | ORAL_TABLET | Freq: Every day | ORAL | 0 refills | Status: DC

## 2023-07-14 ENCOUNTER — Telehealth (HOSPITAL_BASED_OUTPATIENT_CLINIC_OR_DEPARTMENT_OTHER): Payer: 59 | Admitting: Psychiatry

## 2023-07-14 ENCOUNTER — Encounter (HOSPITAL_COMMUNITY): Payer: Self-pay | Admitting: Psychiatry

## 2023-07-14 VITALS — Wt 268.0 lb

## 2023-07-14 DIAGNOSIS — F4010 Social phobia, unspecified: Secondary | ICD-10-CM | POA: Diagnosis not present

## 2023-07-14 DIAGNOSIS — F410 Panic disorder [episodic paroxysmal anxiety] without agoraphobia: Secondary | ICD-10-CM | POA: Diagnosis not present

## 2023-07-14 DIAGNOSIS — F331 Major depressive disorder, recurrent, moderate: Secondary | ICD-10-CM

## 2023-07-14 MED ORDER — ARIPIPRAZOLE 5 MG PO TABS
5.0000 mg | ORAL_TABLET | Freq: Every day | ORAL | 1 refills | Status: DC
Start: 2023-07-14 — End: 2023-09-15

## 2023-07-14 MED ORDER — LORAZEPAM 0.5 MG PO TABS
ORAL_TABLET | ORAL | 1 refills | Status: DC
Start: 2023-07-14 — End: 2023-09-15

## 2023-07-14 MED ORDER — CLOMIPRAMINE HCL 75 MG PO CAPS: 75.0000 mg | ORAL_CAPSULE | Freq: Every day | ORAL | 1 refills | Status: DC

## 2023-07-14 MED ORDER — TRAZODONE HCL 100 MG PO TABS
100.0000 mg | ORAL_TABLET | Freq: Every evening | ORAL | 1 refills | Status: DC | PRN
Start: 2023-07-14 — End: 2023-09-15

## 2023-07-14 NOTE — Progress Notes (Signed)
Paul Parrish   Patient Location: Home Provider Location: Home Office  I connect with patient by video and verified that I am speaking with correct person by using two identifiers. I discussed the limitations of evaluation and management by telemedicine and the availability of in person appointments. I also discussed with the patient that there may be a patient responsible charge related to this service. The patient expressed understanding and agreed to proceed.  Paul Parrish [id number] 52 y.o.  07/14/2023 2:54 PM  History of Present Illness:  Patient is evaluated by phone session.  He reported last week had an attack when he could not catch his breath.  His wife called EMS and when they came he was feeling better.  He is not sure what triggered but he was hyperventilating, he feels would not die.  He admitted anxiety still very intense and he does not want to go anywhere.  He stays to himself and started to have issues with the wife who does not live that he is not doing anything.  We started him on low-dose Abilify.  He noticed in the beginning he did have some motivation to do things but now he does not feel it is working.  He admitted taking Ativan few times extra that calming down.  When he took the Ativan last week when he has been an issue it helps.  He did not require to the hospital.  He sleeps okay.  He works from home.  He denies any hallucination, paranoia or any suicidal thoughts but feels very nervous, anxious around people.  He had multiple medication in the past including ECT with limited response.  He is taking Anafranil 50 mg at bedtime along with Abilify 2 mg, Ativan 0.5 mg as needed and trazodone 100 mg at bedtime.  He has no rash, itching, tremors or shakes.  Recently see his endocrinologist for hypogonadism.  He had a sleep study and diagnosed with apnea.  He is on testosterone.  Past Psychiatric History: H/O anxiety and depression. Saw  provider at neuropsychiatry in [place] but not happy with the treatment.  Saw Dr. Rene Parrish in 2018 and prescribed Prozac, Xanax, Effexor, Celexa, propanolol, Lamictal, Vistaril, Adderall, Wellbutrin,  Cymbalta (increased irritability ), Anafranil, Pristiq,  Trintellix and Luvox (did not help). H/O of ECT with no response.  Had genetic testing in 2019.  Paxil, Lexapro, Celexa and Tegretol had significant drug interaction.  No h/o suicidal attempt or inpatient. At [facility] tried [place], Klonopin but did not work. H/O heavy drinking but claims to be sober.        Outpatient Encounter Medications as of 07/14/2023  Medication Sig   ARIPiprazole (ABILIFY) 2 MG tablet Take 1 tablet (2 mg total) by mouth daily.   clomiPRAMINE (ANAFRANIL) 25 MG capsule Take 2 capsules (50 mg total) by mouth at bedtime.   LORazepam (ATIVAN) 0.5 MG tablet Take one tab daily as needed for anxiety   Melatonin 10 MG TABS Take 1 tablet by mouth at bedtime.   metoprolol tartrate (LOPRESSOR) 25 MG tablet Take 1 tablet by mouth twice daily   Multiple Vitamin (MULTI-VITAMIN DAILY PO) Take 1 capsule by mouth 2 (two) times daily. Name: Life Extensions   Omega-3 Fatty Acids (FISH OIL) 1000 MG CAPS Take 1 capsule by mouth 2 (two) times daily.   Risankizumab-rzaa (SKYRIZI PEN) 150 MG/ML SOAJ Inject 150 mg into the skin as directed. Every 12 weeks for maintenance.   sildenafil (REVATIO) 20 MG tablet  TAKE 1 TABLET BY MOUTH ONCE DAILY AS NEEDED   SKYRIZI 150 MG/ML SOSY INJECT 150MG  SUBCUTANEOUSLY EVERY 12 WEEKS AS DIRECTED.   SYRINGE-NEEDLE, DISP, 3 ML 21G X 1-1/2" 3 ML MISC Use to inject testosterone every week   testosterone cypionate (DEPOTESTOSTERONE CYPIONATE) 200 MG/ML injection 50 mg (0.25 ml) IM every 10 days   traZODone (DESYREL) 100 MG tablet Take 1 tablet (100 mg total) by mouth at bedtime as needed for sleep. Bridge to pt appt on 10/15   No facility-administered encounter medications on file as of 07/14/2023.     Recent Results (from the past 2160 hour(s))  CBC with Differential/Platelet     Status: Abnormal   Collection Time: 05/29/23  9:41 AM  Result Value Ref Range   WBC 9.2 3.4 - 10.8 x10E3/uL   RBC 6.07 (H) 4.14 - 5.80 x10E6/uL   Hemoglobin 18.9 (H) 13.0 - 17.7 g/dL   Hematocrit 57.8 (H) 46.9 - 51.0 %   MCV 89 79 - 97 fL   MCH 31.1 26.6 - 33.0 pg   MCHC 35.2 31.5 - 35.7 g/dL   RDW 62.9 52.8 - 41.3 %   Platelets 246 150 - 450 x10E3/uL   Neutrophils 63 Not Estab. %   Lymphs 22 Not Estab. %   Monocytes 13 Not Estab. %   Eos 1 Not Estab. %   Basos 1 Not Estab. %   Neutrophils Absolute 5.8 1.4 - 7.0 x10E3/uL   Lymphocytes Absolute 2.0 0.7 - 3.1 x10E3/uL   Monocytes Absolute 1.2 (H) 0.1 - 0.9 x10E3/uL   EOS (ABSOLUTE) 0.1 0.0 - 0.4 x10E3/uL   Basophils Absolute 0.1 0.0 - 0.2 x10E3/uL   Immature Granulocytes 0 Not Estab. %   Immature Grans (Abs) 0.0 0.0 - 0.1 x10E3/uL  Testosterone, Free, Total, SHBG     Status: Abnormal   Collection Time: 05/29/23  9:41 AM  Result Value Ref Range   Testosterone 364 264 - 916 ng/dL    Comment: Adult male reference interval is based on a population of healthy nonobese males (BMI <30) between 85 and 4 years old. Travison, et.al. JCEM [phone]. PMID: [id number].    Testosterone, Free 7.0 (L) 7.2 - 24.0 pg/mL   Sex Hormone Binding 22.8 19.3 - 76.4 nmol/L     Psychiatric Specialty Exam: Physical Exam  Review of Systems  Weight 268 lb (121.6 kg).There is no height or weight on file to calculate BMI.  General Appearance: NA  Eye Contact:  NA  Speech:  Slow  Volume:  Decreased  Mood:  Anxious and Dysphoric  Affect:  NA  Thought Process:  Goal Directed  Orientation:  Full (Time, Place, and Person)  Thought Content:  Rumination  Suicidal Thoughts:  No  Homicidal Thoughts:  No  Memory:  Immediate;   Good Recent;   Good Remote;   Good  Judgement:  Intact  Insight:  Present  Psychomotor Activity:  Decreased  Concentration:   Concentration: Fair and Attention Span: Fair  Recall:  Good  Fund of Knowledge:  Good  Language:  Good  Akathisia:  No  Handed:  Right  AIMS (if indicated):     Assets:  Communication Skills Desire for Improvement Housing Social Support Talents/Skills Transportation  ADL's:  Intact  Cognition:  WNL  Sleep:  ok     Assessment/Plan: Moderate episode of recurrent major depressive disorder (HCC) - Plan: ARIPiprazole (ABILIFY) 5 MG tablet  Social anxiety disorder - Plan: LORazepam (ATIVAN) 0.5 MG tablet, traZODone (DESYREL) 100 MG  tablet, ARIPiprazole (ABILIFY) 5 MG tablet  Panic attacks - Plan: LORazepam (ATIVAN) 0.5 MG tablet, ARIPiprazole (ABILIFY) 5 MG tablet  I reviewed blood work results, current medication.  He is showing marginal improvement with Abilify 2 mg.  He did not report any side effects of the medication.  It appears he may have a panic attack last week when he called 911/EMS when he had difficulty breathing but he took the Ativan that calmed him down.  We will provide extra 50 tablets of Ativan to help his anxiety and panic attack.  I recommend to try Abilify 5 mg daily and increase Anafranil 75 mg at bedtime.  We have referred to see a therapist but his insurance did not cover the therapist.  He like to get some more referrals.  Discussed medication side effects and benefits.  Reassurance given.  We will follow-up in 2 months.  We will provide referral to see therapist.   Follow Up Instructions:     I discussed the assessment and treatment plan with the patient. The patient was provided an opportunity to ask questions and all were answered. The patient agreed with the plan and demonstrated an understanding of the instructions.   The patient was advised to call back or seek an in-person evaluation if the symptoms worsen or if the condition fails to improve as anticipated.    Collaboration of Care: Other provider involved in patient's care AEB notes are available in  epic to review  Patient/Guardian was advised Release of Information must be obtained prior to any record release in order to collaborate their care with an outside provider. Patient/Guardian was advised if they have not already done so to contact the registration department to sign all necessary forms in order for [place] to release information regarding their care.   Consent: Patient/Guardian gives verbal consent for treatment and assignment of benefits for services provided during this visit. Patient/Guardian expressed understanding and agreed to proceed.     I provided 25 minutes of non face to face time during this encounter.  Parrish: This document was prepared by [employer] voice dictation technology and any errors that results from this process are unintentional.    Cleotis Nipper, MD 07/14/2023

## 2023-07-17 ENCOUNTER — Other Ambulatory Visit: Payer: Self-pay | Admitting: Cardiovascular Disease

## 2023-08-04 ENCOUNTER — Ambulatory Visit (HOSPITAL_COMMUNITY): Payer: 59 | Admitting: Licensed Clinical Social Worker

## 2023-08-11 ENCOUNTER — Telehealth: Payer: Self-pay | Admitting: Family Medicine

## 2023-08-11 DIAGNOSIS — E782 Mixed hyperlipidemia: Secondary | ICD-10-CM

## 2023-08-11 DIAGNOSIS — R7303 Prediabetes: Secondary | ICD-10-CM

## 2023-08-11 DIAGNOSIS — I10 Essential (primary) hypertension: Secondary | ICD-10-CM

## 2023-08-11 NOTE — Telephone Encounter (Signed)
Future lab orders placed

## 2023-08-11 NOTE — Telephone Encounter (Signed)
Patient has a appt for labs to be done nov 15 at 9:15 he needed the order put in for his 58month lab follow up before the appt

## 2023-08-13 ENCOUNTER — Ambulatory Visit (HOSPITAL_COMMUNITY): Payer: 59 | Admitting: Licensed Clinical Social Worker

## 2023-08-15 ENCOUNTER — Other Ambulatory Visit: Payer: 59

## 2023-08-15 LAB — BAYER DCA HB A1C WAIVED: HB A1C (BAYER DCA - WAIVED): 5.4 % (ref 4.8–5.6)

## 2023-08-16 LAB — CMP14+EGFR
ALT: 44 IU/L (ref 0–44)
AST: 30 IU/L (ref 0–40)
Albumin: 4.8 g/dL (ref 3.8–4.9)
Alkaline Phosphatase: 59 IU/L (ref 44–121)
BUN/Creatinine Ratio: 9 (ref 9–20)
BUN: 10 mg/dL (ref 6–24)
Bilirubin Total: 0.6 mg/dL (ref 0.0–1.2)
CO2: 24 mmol/L (ref 20–29)
Calcium: 9.8 mg/dL (ref 8.7–10.2)
Chloride: 97 mmol/L (ref 96–106)
Creatinine, Ser: 1.08 mg/dL (ref 0.76–1.27)
Globulin, Total: 2.8 g/dL (ref 1.5–4.5)
Glucose: 97 mg/dL (ref 70–99)
Potassium: 4.6 mmol/L (ref 3.5–5.2)
Sodium: 139 mmol/L (ref 134–144)
Total Protein: 7.6 g/dL (ref 6.0–8.5)
eGFR: 83 mL/min/{1.73_m2} (ref >59)

## 2023-08-16 LAB — CBC WITH DIFFERENTIAL/PLATELET
Basophils Absolute: 0.1 10*3/uL (ref 0.0–0.2)
Basos: 1 %
EOS (ABSOLUTE): 0.2 10*3/uL (ref 0.0–0.4)
Eos: 2 %
Hematocrit: 55 % — ABNORMAL HIGH (ref 37.5–51.0)
Hemoglobin: 18.5 g/dL — ABNORMAL HIGH (ref 13.0–17.7)
Immature Grans (Abs): 0 10*3/uL (ref 0.0–0.1)
Immature Granulocytes: 0 %
Lymphocytes Absolute: 3.2 10*3/uL — ABNORMAL HIGH (ref 0.7–3.1)
Lymphs: 43 %
MCH: 30.8 pg (ref 26.6–33.0)
MCHC: 33.6 g/dL (ref 31.5–35.7)
MCV: 92 fL (ref 79–97)
Monocytes Absolute: 0.7 10*3/uL (ref 0.1–0.9)
Monocytes: 9 %
Neutrophils Absolute: 3.3 10*3/uL (ref 1.4–7.0)
Neutrophils: 45 %
Platelets: 248 10*3/uL (ref 150–450)
RBC: 6.01 x10E6/uL — ABNORMAL HIGH (ref 4.14–5.80)
RDW: 13.7 % (ref 11.6–15.4)
WBC: 7.5 10*3/uL (ref 3.4–10.8)

## 2023-08-16 LAB — LIPID PANEL
Chol/HDL Ratio: 8 ratio — ABNORMAL HIGH (ref 0.0–5.0)
Cholesterol, Total: 233 mg/dL — ABNORMAL HIGH (ref 100–199)
HDL: 29 mg/dL — ABNORMAL LOW (ref >39)
LDL Chol Calc (NIH): 141 mg/dL — ABNORMAL HIGH (ref 0–99)
Triglycerides: 341 mg/dL — ABNORMAL HIGH (ref 0–149)
VLDL Cholesterol Cal: 63 mg/dL — ABNORMAL HIGH (ref 5–40)

## 2023-08-21 ENCOUNTER — Encounter: Payer: Self-pay | Admitting: Family Medicine

## 2023-08-21 ENCOUNTER — Ambulatory Visit: Payer: 59 | Admitting: Family Medicine

## 2023-08-21 ENCOUNTER — Ambulatory Visit (INDEPENDENT_AMBULATORY_CARE_PROVIDER_SITE_OTHER): Payer: 59

## 2023-08-21 VITALS — BP 129/80 | HR 91 | Ht 74.0 in | Wt 283.0 lb

## 2023-08-21 DIAGNOSIS — E781 Pure hyperglyceridemia: Secondary | ICD-10-CM

## 2023-08-21 DIAGNOSIS — E782 Mixed hyperlipidemia: Secondary | ICD-10-CM

## 2023-08-21 DIAGNOSIS — I10 Essential (primary) hypertension: Secondary | ICD-10-CM

## 2023-08-21 DIAGNOSIS — Z125 Encounter for screening for malignant neoplasm of prostate: Secondary | ICD-10-CM

## 2023-08-21 DIAGNOSIS — R7303 Prediabetes: Secondary | ICD-10-CM

## 2023-08-21 MED ORDER — FENOFIBRATE 48 MG PO TABS: 48.0000 mg | ORAL_TABLET | Freq: Every day | ORAL | 3 refills | Status: DC

## 2023-08-21 MED ORDER — ROSUVASTATIN CALCIUM 10 MG PO TABS: 10.0000 mg | ORAL_TABLET | Freq: Every day | ORAL | 3 refills | Status: DC

## 2023-08-21 NOTE — Addendum Note (Signed)
Addended by: Arville Care on: 08/21/2023 04:25 PM   Modules accepted: Orders

## 2023-08-21 NOTE — Progress Notes (Addendum)
BP 129/80   Pulse 91   Ht 6\' 2"  (1.88 m)   Wt 283 lb (128.4 kg)   SpO2 96%   BMI 36.34 kg/m    Subjective:   Patient ID: Paul Parrish, male    DOB: 09/02/1971, 52 y.o.   MRN: [id number]  HPI: Paul Parrish is a 52 y.o. male presenting on 08/21/2023 for Medical Management of Chronic Issues and Hypertension   HPI Hypertension Patient is currently on metoprolol, and their blood pressure today is 128/80. Patient denies any lightheadedness or dizziness. Patient denies headaches, blurred vision, chest pains, or weakness. Denies any side effects from medication and is content with current medication.   Hyperlipidemia and hypertriglyceridemia Patient is coming in for recheck of his hyperlipidemia. The patient is currently taking fish oils, had been taking Crestor and fenofibrate and thought he was having a shortness of breath reaction to them back in May but then he stopped them and he still having that issue so he is willing to restart them.. They deny any issues with myalgias or history of liver damage from it. They deny any focal numbness or weakness or chest pain.   Prediabetes Patient comes in today for recheck of his diabetes. Patient has been currently taking no medicine currently, diet control. Patient is not currently on an ACE inhibitor/ARB. Patient has not seen an ophthalmologist this year. Patient denies any new issues with their feet. The symptom started onset as an adult hypertension and hyperlipidemia ARE RELATED TO DM   Patient says he has been having episodes where he feels short of breath sometimes, he says that the episodes will last about 20 minutes and sometimes will happen at rest or sometimes on exertion.  He denies any chest pain or palpitations or flutters with them.  He says gets 2-3 episodes approximately per month and it has been going on for the past 7 months.  He thought initially was a side effect from the medicine that he was taking for cholesterol but then he stopped  that having these episodes are still happening occasionally.  He does have a cough that he gets chronically associated with it.  The cough will come and go but is usually nonproductive.  Relevant past medical, surgical, family and social history reviewed and updated as indicated. Interim medical history since our last visit reviewed. Allergies and medications reviewed and updated.  Review of Systems  Constitutional:  Negative for chills and fever.  HENT:  Negative for congestion.   Respiratory:  Positive for cough and shortness of breath. Negative for chest tightness and wheezing.   Cardiovascular:  Negative for chest pain and leg swelling.  Musculoskeletal:  Negative for back pain and gait problem.  Skin:  Negative for rash.  All other systems reviewed and are negative.   Per HPI unless specifically indicated above   Allergies as of 08/21/2023       Reactions   Amoxicillin Other (See Comments)   High fever and possible heart racing   Bactrim [sulfamethoxazole-trimethoprim] Other (See Comments)   Fever, body aches, chills   Penicillins Other (See Comments)   High fever Has patient had a PCN reaction causing immediate rash, facial/tongue/throat swelling, SOB or lightheadedness with hypotension: No Has patient had a PCN reaction causing severe rash involving mucus membranes or skin necrosis: No Has patient had a PCN reaction that required hospitalization No Has patient had a PCN reaction occurring within the last 10 years: No If all of the above answers are "NO",  then may proceed with Cephalosporin use.        Medication List        Accurate as of August 21, 2023  4:25 PM. If you have any questions, ask your nurse or doctor.          ARIPiprazole 5 MG tablet Commonly known as: Abilify Take 1 tablet (5 mg total) by mouth daily.   clomiPRAMINE 75 MG capsule Commonly known as: ANAFRANIL Take 1 capsule (75 mg total) by mouth at bedtime.   fenofibrate 48 MG  tablet Commonly known as: Tricor Take 1 tablet (48 mg total) by mouth daily. Started by: Elige Radon Ollie Esty   Fish Oil 1000 MG Caps Take 1 capsule by mouth 2 (two) times daily.   LORazepam 0.5 MG tablet Commonly known as: Ativan Take one tab daily and 2nd if needed for anxiety   Melatonin 10 MG Tabs Take 1 tablet by mouth at bedtime.   metoprolol tartrate 25 MG tablet Commonly known as: LOPRESSOR Take 1 tablet (25 mg total) by mouth 2 (two) times daily. Please call [phone] to schedule an April appointment for future refills. Thank you.   MULTI-VITAMIN DAILY PO Take 1 capsule by mouth 2 (two) times daily. Name: Life Extensions   rosuvastatin 10 MG tablet Commonly known as: Crestor Take 1 tablet (10 mg total) by mouth daily. Started by: Elige Radon Koni Kannan   sildenafil 20 MG tablet Commonly known as: REVATIO TAKE 1 TABLET BY MOUTH ONCE DAILY AS NEEDED   Skyrizi 150 MG/ML Sosy prefilled syringe Generic drug: risankizumab-rzaa INJECT 150MG  SUBCUTANEOUSLY EVERY 12 WEEKS AS DIRECTED.   Skyrizi Pen 150 MG/ML pen Generic drug: risankizumab-rzaa Inject 150 mg into the skin as directed. Every 12 weeks for maintenance.   SYRINGE-NEEDLE (DISP) 3 ML 21G X 1-1/2" 3 ML Misc Use to inject testosterone every week   testosterone cypionate 200 MG/ML injection Commonly known as: DEPOTESTOSTERONE CYPIONATE 50 mg (0.25 ml) IM every 10 days   traZODone 100 MG tablet Commonly known as: DESYREL Take 1 tablet (100 mg total) by mouth at bedtime as needed for sleep.         Objective:   BP 129/80   Pulse 91   Ht 6\' 2"  (1.88 m)   Wt 283 lb (128.4 kg)   SpO2 96%   BMI 36.34 kg/m   Wt Readings from Last 3 Encounters:  08/21/23 283 lb (128.4 kg)  06/03/23 268 lb 3.2 oz (121.7 kg)  04/11/23 268 lb 12.8 oz (121.9 kg)    Physical Exam Vitals and nursing note reviewed.  Constitutional:      General: He is not in acute distress.    Appearance: He is well-developed. He is not  diaphoretic.  Eyes:     General: No scleral icterus.    Conjunctiva/sclera: Conjunctivae normal.  Neck:     Thyroid: No thyromegaly.  Cardiovascular:     Rate and Rhythm: Normal rate and regular rhythm.     Heart sounds: Normal heart sounds. No murmur heard. Pulmonary:     Effort: Pulmonary effort is normal. No respiratory distress.     Breath sounds: Normal breath sounds. No wheezing, rhonchi or rales.  Musculoskeletal:        General: Normal range of motion.     Cervical back: Neck supple.  Lymphadenopathy:     Cervical: No cervical adenopathy.  Skin:    General: Skin is warm and dry.     Findings: No rash.  Neurological:  Mental Status: He is alert and oriented to person, place, and time.     Coordination: Coordination normal.  Psychiatric:        Behavior: Behavior normal.     Results for orders placed or performed in visit on 08/15/23  Bayer DCA Hb A1c Waived  Result Value Ref Range   HB A1C (BAYER DCA - WAIVED) 5.4 4.8 - 5.6 %  Lipid panel  Result Value Ref Range   Cholesterol, Total 233 (H) 100 - 199 mg/dL   Triglycerides 409 (H) 0 - 149 mg/dL   HDL 29 (L) >81 mg/dL   VLDL Cholesterol Cal 63 (H) 5 - 40 mg/dL   LDL Chol Calc (NIH) 191 (H) 0 - 99 mg/dL   Chol/HDL Ratio 8.0 (H) 0.0 - 5.0 ratio  CBC with Differential/Platelet  Result Value Ref Range   WBC 7.5 3.4 - 10.8 x10E3/uL   RBC 6.01 (H) 4.14 - 5.80 x10E6/uL   Hemoglobin 18.5 (H) 13.0 - 17.7 g/dL   Hematocrit 47.8 (H) 29.5 - 51.0 %   MCV 92 79 - 97 fL   MCH 30.8 26.6 - 33.0 pg   MCHC 33.6 31.5 - 35.7 g/dL   RDW 62.1 30.8 - 65.7 %   Platelets 248 150 - 450 x10E3/uL   Neutrophils 45 Not Estab. %   Lymphs 43 Not Estab. %   Monocytes 9 Not Estab. %   Eos 2 Not Estab. %   Basos 1 Not Estab. %   Neutrophils Absolute 3.3 1.4 - 7.0 x10E3/uL   Lymphocytes Absolute 3.2 (H) 0.7 - 3.1 x10E3/uL   Monocytes Absolute 0.7 0.1 - 0.9 x10E3/uL   EOS (ABSOLUTE) 0.2 0.0 - 0.4 x10E3/uL   Basophils Absolute 0.1 0.0 -  0.2 x10E3/uL   Immature Granulocytes 0 Not Estab. %   Immature Grans (Abs) 0.0 0.0 - 0.1 x10E3/uL  CMP14+EGFR  Result Value Ref Range   Glucose 97 70 - 99 mg/dL   BUN 10 6 - 24 mg/dL   Creatinine, Ser 8.46 0.76 - 1.27 mg/dL   eGFR 83 >96 EX/BMW/4.13   BUN/Creatinine Ratio 9 9 - 20   Sodium 139 134 - 144 mmol/L   Potassium 4.6 3.5 - 5.2 mmol/L   Chloride 97 96 - 106 mmol/L   CO2 24 20 - 29 mmol/L   Calcium 9.8 8.7 - 10.2 mg/dL   Total Protein 7.6 6.0 - 8.5 g/dL   Albumin 4.8 3.8 - 4.9 g/dL   Globulin, Total 2.8 1.5 - 4.5 g/dL   Bilirubin Total 0.6 0.0 - 1.2 mg/dL   Alkaline Phosphatase 59 44 - 121 IU/L   AST 30 0 - 40 IU/L   ALT 44 0 - 44 IU/L    Assessment & Plan:   Problem List Items Addressed This Visit       Cardiovascular and Mediastinum   Hypertension - Primary   Relevant Medications   fenofibrate (TRICOR) 48 MG tablet   rosuvastatin (CRESTOR) 10 MG tablet   Other Relevant Orders   DG Chest 2 View   Ambulatory referral to Pulmonology     Other   Hypertriglyceridemia   Relevant Medications   fenofibrate (TRICOR) 48 MG tablet   rosuvastatin (CRESTOR) 10 MG tablet   Other Relevant Orders   CBC with Differential/Platelet   Lipid panel   Prediabetes   Relevant Orders   CBC with Differential/Platelet   CMP14+EGFR   Hyperlipidemia   Relevant Medications   fenofibrate (TRICOR) 48 MG tablet  rosuvastatin (CRESTOR) 10 MG tablet   Other Relevant Orders   Lipid panel   Other Visit Diagnoses     Prostate cancer screening       Relevant Orders   PSA, total and free       Continue current medicine, will do chest x-ray on the way out to look at his lungs and also do referral to pulmonology for exertional shortness of breath.  He is already had a cardiac workup and they seem to think the heart was fine.  Restart Crestor and fenofibrate  Chest x-ray: Await final read from radiology. Follow up plan: Return in about 6 months (around 02/18/2024), or if symptoms  worsen or fail to improve, for Dyslipidemia recheck.  Counseling provided for all of the vaccine components Orders Placed This Encounter  Procedures   DG Chest 2 View   CBC with Differential/Platelet   CMP14+EGFR   Lipid panel   PSA, total and free   Ambulatory referral to Pulmonology    Arville Care, MD [facility] 08/21/2023, 4:25 PM

## 2023-08-25 ENCOUNTER — Encounter: Payer: Self-pay | Admitting: Family Medicine

## 2023-09-11 ENCOUNTER — Other Ambulatory Visit: Payer: Self-pay | Admitting: Family Medicine

## 2023-09-15 ENCOUNTER — Telehealth (HOSPITAL_COMMUNITY): Payer: 59 | Admitting: Psychiatry

## 2023-09-15 ENCOUNTER — Encounter (HOSPITAL_COMMUNITY): Payer: Self-pay | Admitting: Psychiatry

## 2023-09-15 VITALS — Wt 283.0 lb

## 2023-09-15 DIAGNOSIS — F331 Major depressive disorder, recurrent, moderate: Secondary | ICD-10-CM

## 2023-09-15 DIAGNOSIS — F410 Panic disorder [episodic paroxysmal anxiety] without agoraphobia: Secondary | ICD-10-CM

## 2023-09-15 DIAGNOSIS — F4010 Social phobia, unspecified: Secondary | ICD-10-CM | POA: Diagnosis not present

## 2023-09-15 MED ORDER — ARIPIPRAZOLE 5 MG PO TABS: 5.0000 mg | ORAL_TABLET | Freq: Every day | ORAL | 2 refills | Status: DC

## 2023-09-15 MED ORDER — TRAZODONE HCL 100 MG PO TABS: 100.0000 mg | ORAL_TABLET | Freq: Every evening | ORAL | 2 refills | Status: DC | PRN

## 2023-09-15 MED ORDER — LORAZEPAM 0.5 MG PO TABS: ORAL_TABLET | ORAL | 2 refills | Status: DC

## 2023-09-15 MED ORDER — CLOMIPRAMINE HCL 75 MG PO CAPS: 75.0000 mg | ORAL_CAPSULE | Freq: Every day | ORAL | 2 refills | Status: DC

## 2023-09-15 NOTE — Progress Notes (Signed)
[facility] MD Virtual Progress Note   Patient Location: Home Provider Location: Home Office  I connect with patient by telephone and verified that I am speaking with correct person by using two identifiers. I discussed the limitations of evaluation and management by telemedicine and the availability of in person appointments. I also discussed with the patient that there may be a patient responsible charge related to this service. The patient expressed understanding and agreed to proceed.  Paul Parrish [id number] 52 y.o.  09/15/2023 2:53 PM  History of Present Illness:  Patient is evaluated by phone session.  On the last visit we increased Anafranil and Abilify.  He noticed improvement in his mood depression and anxiety.  He has no panic attacks since the last visit.  He is sleeping better with the help of trazodone and CPAP.  He has no tremors or shakes or any EPS.  Still struggle with social anxiety but denies any crying spells or any feeling of hopelessness or worthlessness.  Recently had a visit with the primary care and labs are drawn.  Hemoglobin A1c normal.  He is referred to see a pulmonologist.  He has taken few times Ativan to help the panic attacks which usually works for him.  He denies drinking or using any illegal substances.  Patient works from home.  Denies any mania, anger, agitation.  He has no rash or any itching.  He had gained weight but also reported not taking the metformin due to GI side effects.  Past Psychiatric History: H/O anxiety and depression. Saw provider at neuropsychiatry in [place] but not happy with the treatment.  Saw Dr. Rene Kocher in 2018 and given Prozac, Xanax, Effexor, Celexa, propanolol, Lamictal, Vistaril, Adderall, Wellbutrin,  Cymbalta (increased irritability ), Anafranil, Pristiq, Trintellix and Luvox (did not help). H/O of ECT with no response.  Had genetic testing in 2019.  Paxil, Lexapro, Celexa and Tegretol had significant drug  interaction.  No h/o suicidal attempt or inpatient. At [facility] tried [place], Klonopin but did not work. H/O heavy drinking but claims to be sober.      Outpatient Encounter Medications as of 09/15/2023  Medication Sig   ARIPiprazole (ABILIFY) 5 MG tablet Take 1 tablet (5 mg total) by mouth daily.   clomiPRAMINE (ANAFRANIL) 75 MG capsule Take 1 capsule (75 mg total) by mouth at bedtime.   fenofibrate (TRICOR) 48 MG tablet Take 1 tablet (48 mg total) by mouth daily.   LORazepam (ATIVAN) 0.5 MG tablet Take one tab daily and 2nd if needed for anxiety   Melatonin 10 MG TABS Take 1 tablet by mouth at bedtime.   metoprolol tartrate (LOPRESSOR) 25 MG tablet Take 1 tablet (25 mg total) by mouth 2 (two) times daily. Please call [phone] to schedule an April appointment for future refills. Thank you.   Multiple Vitamin (MULTI-VITAMIN DAILY PO) Take 1 capsule by mouth 2 (two) times daily. Name: Life Extensions   Omega-3 Fatty Acids (FISH OIL) 1000 MG CAPS Take 1 capsule by mouth 2 (two) times daily.   Risankizumab-rzaa (SKYRIZI PEN) 150 MG/ML SOAJ Inject 150 mg into the skin as directed. Every 12 weeks for maintenance.   rosuvastatin (CRESTOR) 10 MG tablet Take 1 tablet (10 mg total) by mouth daily.   sildenafil (REVATIO) 20 MG tablet TAKE 1 TABLET BY MOUTH ONCE DAILY AS NEEDED   SKYRIZI 150 MG/ML SOSY INJECT 150MG  SUBCUTANEOUSLY EVERY 12 WEEKS AS DIRECTED.   SYRINGE-NEEDLE, DISP, 3 ML 21G X 1-1/2" 3 ML MISC Use  to inject testosterone every week   testosterone cypionate (DEPOTESTOSTERONE CYPIONATE) 200 MG/ML injection 50 mg (0.25 ml) IM every 10 days   traZODone (DESYREL) 100 MG tablet Take 1 tablet (100 mg total) by mouth at bedtime as needed for sleep.   No facility-administered encounter medications on file as of 09/15/2023.    Recent Results (from the past 2160 hours)  Bayer DCA Hb A1c Waived     Status: None   Collection Time: 08/15/23  9:13 AM  Result Value Ref Range   HB A1C (BAYER  DCA - WAIVED) 5.4 4.8 - 5.6 %    Comment:          Prediabetes: 5.7 - 6.4          Diabetes: >6.4          Glycemic control for adults with diabetes: <7.0   Lipid panel     Status: Abnormal   Collection Time: 08/15/23  9:15 AM  Result Value Ref Range   Cholesterol, Total 233 (H) 100 - 199 mg/dL   Triglycerides 528 (H) 0 - 149 mg/dL   HDL 29 (L) >41 mg/dL   VLDL Cholesterol Cal 63 (H) 5 - 40 mg/dL   LDL Chol Calc (NIH) 324 (H) 0 - 99 mg/dL   Chol/HDL Ratio 8.0 (H) 0.0 - 5.0 ratio    Comment:                                   T. Chol/HDL Ratio                                             Men  Women                               1/2 Avg.Risk  3.4    3.3                                   Avg.Risk  5.0    4.4                                2X Avg.Risk  9.6    7.1                                3X Avg.Risk 23.4   11.0   CBC with Differential/Platelet     Status: Abnormal   Collection Time: 08/15/23  9:15 AM  Result Value Ref Range   WBC 7.5 3.4 - 10.8 x10E3/uL   RBC 6.01 (H) 4.14 - 5.80 x10E6/uL   Hemoglobin 18.5 (H) 13.0 - 17.7 g/dL   Hematocrit 40.1 (H) 02.7 - 51.0 %   MCV 92 79 - 97 fL   MCH 30.8 26.6 - 33.0 pg   MCHC 33.6 31.5 - 35.7 g/dL   RDW 25.3 66.4 - 40.3 %   Platelets 248 150 - 450 x10E3/uL   Neutrophils 45 Not Estab. %   Lymphs 43 Not Estab. %   Monocytes 9 Not Estab. %   Eos 2 Not Estab. %  Basos 1 Not Estab. %   Neutrophils Absolute 3.3 1.4 - 7.0 x10E3/uL   Lymphocytes Absolute 3.2 (H) 0.7 - 3.1 x10E3/uL   Monocytes Absolute 0.7 0.1 - 0.9 x10E3/uL   EOS (ABSOLUTE) 0.2 0.0 - 0.4 x10E3/uL   Basophils Absolute 0.1 0.0 - 0.2 x10E3/uL   Immature Granulocytes 0 Not Estab. %   Immature Grans (Abs) 0.0 0.0 - 0.1 x10E3/uL  CMP14+EGFR     Status: None   Collection Time: 08/15/23  9:15 AM  Result Value Ref Range   Glucose 97 70 - 99 mg/dL   BUN 10 6 - 24 mg/dL   Creatinine, Ser 4.09 0.76 - 1.27 mg/dL   eGFR 83 >81 XB/JYN/8.29   BUN/Creatinine Ratio 9 9 - 20   Sodium  139 134 - 144 mmol/L   Potassium 4.6 3.5 - 5.2 mmol/L   Chloride 97 96 - 106 mmol/L   CO2 24 20 - 29 mmol/L   Calcium 9.8 8.7 - 10.2 mg/dL   Total Protein 7.6 6.0 - 8.5 g/dL   Albumin 4.8 3.8 - 4.9 g/dL   Globulin, Total 2.8 1.5 - 4.5 g/dL   Bilirubin Total 0.6 0.0 - 1.2 mg/dL   Alkaline Phosphatase 59 44 - 121 IU/L   AST 30 0 - 40 IU/L   ALT 44 0 - 44 IU/L     Psychiatric Specialty Exam: Physical Exam  Review of Systems  Weight 283 lb (128.4 kg).There is no height or weight on file to calculate BMI.  General Appearance: NA  Eye Contact:  NA  Speech:  Normal Rate  Volume:  Normal  Mood:  Euthymic  Affect:  NA  Thought Process:  Goal Directed  Orientation:  Full (Time, Place, and Person)  Thought Content:  Logical  Suicidal Thoughts:  No  Homicidal Thoughts:  No  Memory:  Immediate;   Good Recent;   Good Remote;   Good  Judgement:  Intact  Insight:  Present  Psychomotor Activity:  NA  Concentration:  Concentration: Fair and Attention Span: Fair  Recall:  Good  Fund of Knowledge:  Good  Language:  Good  Akathisia:  No  Handed:  Right  AIMS (if indicated):     Assets:  Communication Skills Desire for Improvement Housing Social Support Transportation  ADL's:  Intact  Cognition:  WNL  Sleep:  ok with CPAP     Assessment/Plan: Moderate episode of recurrent major depressive disorder (HCC) - Plan: clomiPRAMINE (ANAFRANIL) 75 MG capsule, ARIPiprazole (ABILIFY) 5 MG tablet  Social anxiety disorder - Plan: clomiPRAMINE (ANAFRANIL) 75 MG capsule, ARIPiprazole (ABILIFY) 5 MG tablet, LORazepam (ATIVAN) 0.5 MG tablet, traZODone (DESYREL) 100 MG tablet  Panic attacks - Plan: clomiPRAMINE (ANAFRANIL) 75 MG capsule, ARIPiprazole (ABILIFY) 5 MG tablet, LORazepam (ATIVAN) 0.5 MG tablet  I reviewed blood work results.  Hemoglobin A1c normal.  He admitted weight gain because not taking the metformin due to GI side effects.  Now like to go back due to the fear that he had gained  weight.  His insurance does not approve Ozempic.  He feels the current psychotropic medication is working very well and his panic attacks and depression is stable.  Discussed watching his calorie intake, regular exercise.  He is working on it.  He also like to keep the appointment with his pulmonologist is coming up.  Continue Abilify 5 mg daily, Anafranil 75 mg at bedtime, trazodone 100 mg at bedtime and Ativan 0.5 mg to take up to 2 a day as  needed for severe panic attack.  We had referred him to see therapist.  Discussed medication side effects and benefits.  Recommend to call us back if he has any question or any concern.  Follow-up in 3 months   Follow Up Instructions:     I discussed the assessment and treatment plan with the patient. The patient was provided an opportunity to ask questions and all were answered. The patient agreed with the plan and demonstrated an understanding of the instructions.   The patient was advised to call back or seek an in-person evaluation if the symptoms worsen or if the condition fails to improve as anticipated.    Collaboration of Care: Other provider involved in patient's care AEB notes are available in epic to review  Patient/Guardian was advised Release of Information must be obtained prior to any record release in order to collaborate their care with an outside provider. Patient/Guardian was advised if they have not already done so to contact the registration department to sign all necessary forms in order for [place] to release information regarding their care.   Consent: Patient/Guardian gives verbal consent for treatment and assignment of benefits for services provided during this visit. Patient/Guardian expressed understanding and agreed to proceed.     I provided 25 minutes of non face to face time during this encounter.  Note: This document was prepared by [employer] voice dictation technology and any errors that results from this process are  unintentional.    Cleotis Nipper, MD 09/15/2023

## 2023-09-29 ENCOUNTER — Other Ambulatory Visit: Payer: 59

## 2023-10-01 LAB — CBC WITH DIFFERENTIAL/PLATELET
Basophils Absolute: 0.1 10*3/uL (ref 0.0–0.2)
Basos: 1 %
EOS (ABSOLUTE): 0.1 10*3/uL (ref 0.0–0.4)
Eos: 2 %
Hematocrit: 52.7 % — ABNORMAL HIGH (ref 37.5–51.0)
Hemoglobin: 17.9 g/dL — ABNORMAL HIGH (ref 13.0–17.7)
Immature Grans (Abs): 0 10*3/uL (ref 0.0–0.1)
Immature Granulocytes: 0 %
Lymphocytes Absolute: 2.3 10*3/uL (ref 0.7–3.1)
Lymphs: 34 %
MCH: 31.2 pg (ref 26.6–33.0)
MCHC: 34 g/dL (ref 31.5–35.7)
MCV: 92 fL (ref 79–97)
Monocytes Absolute: 0.6 10*3/uL (ref 0.1–0.9)
Monocytes: 9 %
Neutrophils Absolute: 3.6 10*3/uL (ref 1.4–7.0)
Neutrophils: 54 %
Platelets: 240 10*3/uL (ref 150–450)
RBC: 5.74 x10E6/uL (ref 4.14–5.80)
RDW: 12.9 % (ref 11.6–15.4)
WBC: 6.7 10*3/uL (ref 3.4–10.8)

## 2023-10-01 LAB — TESTOSTERONE, FREE, TOTAL, SHBG
Sex Hormone Binding: 22 nmol/L (ref 19.3–76.4)
Testosterone, Free: 6.3 pg/mL — ABNORMAL LOW (ref 7.2–24.0)
Testosterone: 192 ng/dL — ABNORMAL LOW (ref 264–916)

## 2023-10-06 ENCOUNTER — Encounter: Payer: Self-pay | Admitting: "Endocrinology

## 2023-10-06 ENCOUNTER — Ambulatory Visit: Payer: 59 | Admitting: "Endocrinology

## 2023-10-06 VITALS — BP 110/76 | HR 72 | Ht 74.0 in | Wt 282.4 lb

## 2023-10-06 DIAGNOSIS — D751 Secondary polycythemia: Secondary | ICD-10-CM | POA: Diagnosis not present

## 2023-10-06 DIAGNOSIS — E291 Testicular hypofunction: Secondary | ICD-10-CM

## 2023-10-06 NOTE — Progress Notes (Signed)
 10/06/2023      Endocrinology follow-up note   HPI: Paul Parrish is a 53 y.o.-year-old man.  - He is being seen in follow-up for  hypogonadism.  He was diagnosed with hypogonadism in 2014, etiology unclear.   -Due to polycythemia, he undergoes phlebotomy periodically.  He is currently on testosterone  50 mg every 10 days.  His previsit labs show mild but improving erythrocytosis.    Patient wishes to continue on testosterone  treatment.  He reports improvement in his symptoms including fatigue and low libido.    -He reports history of seizures as a young man until his teenage years. -He denies any history of head injury.  - He also is recently diagnosed with sleep apnea currently on CPAP.  - He fathers 2 teenage boys biologically. - He has significant mood disorder on multiple antidepressants and antianxiety. No herbal medicines. -He denies family history of premature coronary artery disease.   ROS: Limited as above.  PE: BP 110/76   Pulse 72   Ht 6' 2 (1.88 m)   Wt 282 lb 6.4 oz (128.1 kg)   BMI 36.26 kg/m  Wt Readings from Last 3 Encounters:  10/06/23 282 lb 6.4 oz (128.1 kg)  08/21/23 283 lb (128.4 kg)  06/03/23 268 lb 3.2 oz (121.7 kg)     Recent Results (from the past 2160 hours)  Bayer DCA Hb A1c Waived     Status: None   Collection Time: 08/15/23  9:13 AM  Result Value Ref Range   HB A1C (BAYER DCA - WAIVED) 5.4 4.8 - 5.6 %    Comment:          Prediabetes: 5.7 - 6.4          Diabetes: >6.4          Glycemic control for adults with diabetes: <7.0   Lipid panel     Status: Abnormal   Collection Time: 08/15/23  9:15 AM  Result Value Ref Range   Cholesterol, Total 233 (H) 100 - 199 mg/dL   Triglycerides 658 (H) 0 - 149 mg/dL   HDL 29 (L) >60 mg/dL   VLDL Cholesterol Cal 63 (H) 5 - 40 mg/dL   LDL Chol Calc (NIH) 858 (H) 0 - 99 mg/dL   Chol/HDL Ratio 8.0 (H) 0.0 - 5.0 ratio    Comment:                                   T. Chol/HDL Ratio                                              Men  Women                               1/2 Avg.Risk  3.4    3.3                                   Avg.Risk  5.0    4.4                                2X Avg.Risk  9.6  7.1                                3X Avg.Risk 23.4   11.0   CBC with Differential/Platelet     Status: Abnormal   Collection Time: 08/15/23  9:15 AM  Result Value Ref Range   WBC 7.5 3.4 - 10.8 x10E3/uL   RBC 6.01 (H) 4.14 - 5.80 x10E6/uL   Hemoglobin 18.5 (H) 13.0 - 17.7 g/dL   Hematocrit 44.9 (H) 62.4 - 51.0 %   MCV 92 79 - 97 fL   MCH 30.8 26.6 - 33.0 pg   MCHC 33.6 31.5 - 35.7 g/dL   RDW 86.2 88.3 - 84.5 %   Platelets 248 150 - 450 x10E3/uL   Neutrophils 45 Not Estab. %   Lymphs 43 Not Estab. %   Monocytes 9 Not Estab. %   Eos 2 Not Estab. %   Basos 1 Not Estab. %   Neutrophils Absolute 3.3 1.4 - 7.0 x10E3/uL   Lymphocytes Absolute 3.2 (H) 0.7 - 3.1 x10E3/uL   Monocytes Absolute 0.7 0.1 - 0.9 x10E3/uL   EOS (ABSOLUTE) 0.2 0.0 - 0.4 x10E3/uL   Basophils Absolute 0.1 0.0 - 0.2 x10E3/uL   Immature Granulocytes 0 Not Estab. %   Immature Grans (Abs) 0.0 0.0 - 0.1 x10E3/uL  CMP14+EGFR     Status: None   Collection Time: 08/15/23  9:15 AM  Result Value Ref Range   Glucose 97 70 - 99 mg/dL   BUN 10 6 - 24 mg/dL   Creatinine, Ser 8.91 0.76 - 1.27 mg/dL   eGFR 83 >40 fO/fpw/8.26   BUN/Creatinine Ratio 9 9 - 20   Sodium 139 134 - 144 mmol/L   Potassium 4.6 3.5 - 5.2 mmol/L   Chloride 97 96 - 106 mmol/L   CO2 24 20 - 29 mmol/L   Calcium  9.8 8.7 - 10.2 mg/dL   Total Protein 7.6 6.0 - 8.5 g/dL   Albumin 4.8 3.8 - 4.9 g/dL   Globulin, Total 2.8 1.5 - 4.5 g/dL   Bilirubin Total 0.6 0.0 - 1.2 mg/dL   Alkaline Phosphatase 59 44 - 121 IU/L   AST 30 0 - 40 IU/L   ALT 44 0 - 44 IU/L  Testosterone , Free, Total, SHBG     Status: Abnormal   Collection Time: 09/29/23  9:57 AM  Result Value Ref Range   Testosterone  192 (L) 264 - 916 ng/dL    Comment: Adult male reference interval is based on a  population of healthy nonobese males (BMI <30) between 41 and 63 years old. Travison, et.al. JCEM [phone]. PMID: [id number].    Testosterone , Free 6.3 (L) 7.2 - 24.0 pg/mL   Sex Hormone Binding 22.0 19.3 - 76.4 nmol/L  CBC with Differential/Platelet     Status: Abnormal   Collection Time: 09/29/23  9:57 AM  Result Value Ref Range   WBC 6.7 3.4 - 10.8 x10E3/uL   RBC 5.74 4.14 - 5.80 x10E6/uL   Hemoglobin 17.9 (H) 13.0 - 17.7 g/dL   Hematocrit 47.2 (H) 62.4 - 51.0 %   MCV 92 79 - 97 fL   MCH 31.2 26.6 - 33.0 pg   MCHC 34.0 31.5 - 35.7 g/dL   RDW 87.0 88.3 - 84.5 %   Platelets 240 150 - 450 x10E3/uL   Neutrophils 54 Not Estab. %   Lymphs 34 Not Estab. %   Monocytes 9 Not Estab. %  Eos 2 Not Estab. %   Basos 1 Not Estab. %   Neutrophils Absolute 3.6 1.4 - 7.0 x10E3/uL   Lymphocytes Absolute 2.3 0.7 - 3.1 x10E3/uL   Monocytes Absolute 0.6 0.1 - 0.9 x10E3/uL   EOS (ABSOLUTE) 0.1 0.0 - 0.4 x10E3/uL   Basophils Absolute 0.1 0.0 - 0.2 x10E3/uL   Immature Granulocytes 0 Not Estab. %   Immature Grans (Abs) 0.0 0.0 - 0.1 x10E3/uL      ASSESSMENT: 1.  Hypogonadism  2. Erectile Dysfunction 3.  Polycythemia 4.  Prediabetes  PLAN:  1. Hypogonadism  -He did phlebotomy at least once since his last visit.  His recent labs are showing improved erythrocytosis with hemoglobin at 17.9 and hematocrit at 52.7.  His total T is at 192- suboptimal.  However, to balance safety, he is  advised to continue his  testosterone   at  50 mg every 10 days .  His history of  secondary polycythemia which continues to require periodic phlebotomy, as well as sleep apnea requiring CPAP treatment making him a poor candidate for more generous testosterone  replacement. Regarding his weight concern: His insurance did not provide coverage for injectable weight loss medications. He remains a good fit for lifestyle medicine.  - he acknowledges that there is a room for improvement in his food and drink  choices. - Suggestion is made for him to avoid simple carbohydrates  from his diet including Cakes, Sweet Desserts, Ice Cream, Soda (diet and regular), Sweet Tea, Candies, Chips, Cookies, Store Bought Juices, Alcohol , Artificial Sweeteners,  Coffee Creamer, and Sugar-free Products, Lemonade. This will help patient to have more stable blood glucose profile and potentially avoid unintended weight gain.  The following Lifestyle Medicine recommendations according to American College of Lifestyle Medicine  [facility]) were discussed and and offered to patient and he  agrees to start the journey:  A. Whole Foods, Plant-Based Nutrition comprising of fruits and vegetables, plant-based proteins, whole-grain carbohydrates was discussed in detail with the patient.   A list for source of those nutrients were also provided to the patient.  Patient will use only water or unsweetened tea for hydration. B.  The need to stay away from risky substances including alcohol, smoking; obtaining 7 to 9 hours of restorative sleep, at least 150 minutes of moderate intensity exercise weekly, the importance of healthy social connections,  and stress management techniques were discussed. C.  A full color page of  Calorie density of various food groups per pound showing examples of each food groups was provided to the patient.   He is advised to continue close follow-up with his PMD for primary care needs.    I spent  22 minutes in the care of the patient today including review of labs from Thyroid  Function, CMP, and other relevant labs ; imaging/biopsy records (current and previous including abstractions from other facilities); face-to-face time discussing  his lab results and symptoms, medications doses, his options of short and long term treatment based on the latest standards of care / guidelines;   and documenting the encounter.  Oneil Lunger  participated in the discussions, expressed understanding, and voiced agreement with  the above plans.  All questions were answered to his satisfaction. he is encouraged to contact clinic should he have any questions or concerns prior to his return visit.   Ranny Earl, MD Phone: [phone]  Fax: [phone]  -  This note was partially dictated with voice recognition software. Similar sounding words can be transcribed inadequately or  may not  be corrected upon review.  10/06/2023, 4:00 PM

## 2023-10-09 ENCOUNTER — Encounter: Payer: Self-pay | Admitting: Cardiovascular Disease

## 2023-10-09 DIAGNOSIS — R000 Tachycardia, unspecified: Secondary | ICD-10-CM

## 2023-10-09 NOTE — Telephone Encounter (Signed)
 Called pt report has had episodes of HR going up late into the day.  High HR happened 3 times last week and twice this week.  Had an episode last night where he called 911.  HR up to 150 around 7:30 pm last night.  Called EMS arrived around 8:15 pm.  Per pt HR 144 and down to 120's during EKG. BP140/73  Reports EMS felt episode may be anxiety related did not see anything concerning.   Pt does not check BP at home.  Has not missed any doses of metoprolol  25 mg BID.  Denies increased caffeine intake or dehydration.  Has a [employer] reports last night during episode it did show possible AF.  Advised pt to call 911 if symptoms occur again.  Will route to MD for recommendations; possible heart monitor.

## 2023-10-10 ENCOUNTER — Ambulatory Visit: Payer: 59 | Attending: Cardiovascular Disease

## 2023-10-10 DIAGNOSIS — R000 Tachycardia, unspecified: Secondary | ICD-10-CM

## 2023-10-10 MED ORDER — PROPRANOLOL HCL 20 MG PO TABS: 20.0000 mg | ORAL_TABLET | Freq: Four times a day (QID) | ORAL | 3 refills | Status: AC

## 2023-10-10 NOTE — Progress Notes (Unsigned)
 Enrolled patient for a 14 day Zio XT  monitor to be mailed to patients home

## 2023-10-10 NOTE — Telephone Encounter (Signed)
 Nahser, Aleene PARAS, MD sent to Verta Lauraine POUR, RN He has a hx of sinus tachycardia in the past . Rock and [place]  have been under lots of stress recently with the hospitalization of their son for Mono and splenomegaly . Lets place a 14 day event Zio monitor to make sure he is not having atrial fib He should be taking metoprolol  25 mg BID Please add Propranolol  20 mg po QID ORN tachycardia Have him see an APP or me in an open slot in the next several weeks PN  Called and reviewed above with patient who agrees to plan. Propranolol  sent to pharmacy, zio ordered, and appt scheduled for 11/07/23.

## 2023-10-13 ENCOUNTER — Other Ambulatory Visit: Payer: Self-pay

## 2023-10-13 ENCOUNTER — Encounter (HOSPITAL_BASED_OUTPATIENT_CLINIC_OR_DEPARTMENT_OTHER): Payer: Self-pay | Admitting: Emergency Medicine

## 2023-10-13 ENCOUNTER — Emergency Department (HOSPITAL_BASED_OUTPATIENT_CLINIC_OR_DEPARTMENT_OTHER)
Admission: EM | Admit: 2023-10-13 | Discharge: 2023-10-13 | Disposition: A | Discharge status: Home / Self Care | Payer: 59 | Attending: Emergency Medicine | Admitting: Emergency Medicine

## 2023-10-13 ENCOUNTER — Emergency Department (HOSPITAL_BASED_OUTPATIENT_CLINIC_OR_DEPARTMENT_OTHER): Payer: 59 | Admitting: Radiology

## 2023-10-13 DIAGNOSIS — R0602 Shortness of breath: Secondary | ICD-10-CM | POA: Diagnosis not present

## 2023-10-13 DIAGNOSIS — R0789 Other chest pain: Secondary | ICD-10-CM | POA: Diagnosis not present

## 2023-10-13 DIAGNOSIS — R079 Chest pain, unspecified: Secondary | ICD-10-CM | POA: Diagnosis present

## 2023-10-13 LAB — TROPONIN I (HIGH SENSITIVITY)
Troponin I (High Sensitivity): 2 ng/L (ref <18)
Troponin I (High Sensitivity): 2 ng/L (ref <18)

## 2023-10-13 LAB — CBC
HCT: 49.1 % (ref 39.0–52.0)
Hemoglobin: 17.6 g/dL — ABNORMAL HIGH (ref 13.0–17.0)
MCH: 30.8 pg (ref 26.0–34.0)
MCHC: 35.8 g/dL (ref 30.0–36.0)
MCV: 86 fL (ref 80.0–100.0)
Platelets: 316 10*3/uL (ref 150–400)
RBC: 5.71 MIL/uL (ref 4.22–5.81)
RDW: 12 % (ref 11.5–15.5)
WBC: 7.4 10*3/uL (ref 4.0–10.5)
nRBC: 0 % (ref 0.0–0.2)

## 2023-10-13 LAB — BASIC METABOLIC PANEL
Anion gap: 9 (ref 5–15)
BUN: 13 mg/dL (ref 6–20)
CO2: 26 mmol/L (ref 22–32)
Calcium: 9.4 mg/dL (ref 8.9–10.3)
Chloride: 99 mmol/L (ref 98–111)
Creatinine, Ser: 1.22 mg/dL (ref 0.61–1.24)
GFR, Estimated: >60 mL/min (ref >60)
Glucose, Bld: 132 mg/dL — ABNORMAL HIGH (ref 70–99)
Potassium: 3.7 mmol/L (ref 3.5–5.1)
Sodium: 134 mmol/L — ABNORMAL LOW (ref 135–145)

## 2023-10-13 LAB — TSH: TSH: 2.143 u[IU]/mL (ref 0.350–4.500)

## 2023-10-13 NOTE — ED Provider Notes (Signed)
 [place] EMERGENCY DEPARTMENT AT [facility] Provider Note   CSN: [id number] Arrival date & time: 10/13/23  1654     History  Chief Complaint  Patient presents with   Chest Pain    Paul Parrish is a 53 y.o. male who presents with chest pain and shortness of breath that began around 330 this afternoon.  Patient was standing in a food line when his symptoms began.  Endorses exertional component.  No history of cardiac issues however his cardiologist placed him on metoprolol  approximately 6 months ago for elevated heart rate.  However reports the past couple months he has noticed his heart rate has been climbing and even called EMS last week for heart rate in the 150s and feeling short of breath.  He did not go to the hospital at that time.  No history of blood clot or risk factors.  He does have a history of anxiety, however no recent increase of stressors and is not sure whether the symptoms are related.  Of note patient does report a chronic nonproductive cough for the past year, his first appointment with pulmonology is coming up this week.  Denies any unexpected weight loss or drenching night sweats.  He did have a CT cardiac scoring 6/24 without any acute unexpected extracardiac findings   Chest Pain      Home Medications Prior to Admission medications   Medication Sig Start Date End Date Taking? Authorizing Provider  ARIPiprazole  (ABILIFY ) 5 MG tablet Take 1 tablet (5 mg total) by mouth daily. 09/15/23   Arfeen, Leni DASEN, MD  clomiPRAMINE  (ANAFRANIL ) 75 MG capsule Take 1 capsule (75 mg total) by mouth at bedtime. 09/15/23 12/14/23  Arfeen, Leni DASEN, MD  fenofibrate  (TRICOR ) 48 MG tablet Take 1 tablet (48 mg total) by mouth daily. 08/21/23   Dettinger, Fonda LABOR, MD  LORazepam  (ATIVAN ) 0.5 MG tablet Take one tab daily and 2nd if needed for anxiety 09/15/23   Arfeen, Leni DASEN, MD  Melatonin 10 MG TABS Take 1 tablet by mouth at bedtime.    [provider]  metoprolol   tartrate (LOPRESSOR ) 25 MG tablet Take 1 tablet (25 mg total) by mouth 2 (two) times daily. Please call [phone] to schedule an April appointment for future refills. Thank you. 07/17/23   Nahser, Aleene PARAS, MD  Multiple Vitamin (MULTI-VITAMIN DAILY PO) Take 1 capsule by mouth 2 (two) times daily. Name: Life Extensions    [provider]  Omega-3 Fatty Acids (FISH OIL) 1000 MG CAPS Take 1 capsule by mouth 2 (two) times daily.    [provider]  propranolol  (INDERAL ) 20 MG tablet Take 1 tablet (20 mg total) by mouth 4 (four) times daily. Only as needed for tachycardia/palpitations 10/10/23   Nahser, Aleene PARAS, MD  Risankizumab -rzaa (SKYRIZI  PEN) 150 MG/ML SOAJ Inject 150 mg into the skin as directed. Every 12 weeks for maintenance. 04/30/22   Livingston Rigg, MD  rosuvastatin  (CRESTOR ) 10 MG tablet Take 1 tablet (10 mg total) by mouth daily. 08/21/23   Dettinger, Fonda LABOR, MD  sildenafil  (REVATIO ) 20 MG tablet TAKE 1 TABLET BY MOUTH ONCE DAILY AS NEEDED 09/11/23   Dettinger, Fonda LABOR, MD  SKYRIZI  150 MG/ML SOSY INJECT 150MG  SUBCUTANEOUSLY EVERY 12 WEEKS AS DIRECTED. 04/03/22   Sheffield, Kelli R, PA-C  SYRINGE-NEEDLE, DISP, 3 ML 21G X 1-1/2 3 ML MISC Use to inject testosterone  every week 06/03/23   Nida, Gebreselassie W, MD  testosterone  cypionate (DEPOTESTOSTERONE CYPIONATE) 200 MG/ML injection 50 mg (0.25  ml) IM every 10 days 06/03/23   Nida, Gebreselassie W, MD  traZODone  (DESYREL ) 100 MG tablet Take 1 tablet (100 mg total) by mouth at bedtime as needed for sleep. 09/15/23   Arfeen, Leni DASEN, MD      Allergies    Amoxicillin, Bactrim [sulfamethoxazole-trimethoprim], and Penicillins    Review of Systems   Review of Systems  Cardiovascular:  Positive for chest pain.    Physical Exam Updated Vital Signs BP (!) 123/100   Pulse (!) 116   Temp 98.2 F (36.8 C) (Oral)   Resp 18   SpO2 97%  Physical Exam Vitals and nursing note reviewed.  Constitutional:      General: He is not in  acute distress.    Appearance: He is well-developed.  HENT:     Head: Normocephalic and atraumatic.  Eyes:     Conjunctiva/sclera: Conjunctivae normal.  Cardiovascular:     Rate and Rhythm: Regular rhythm. Tachycardia present.     Heart sounds: No murmur heard. Pulmonary:     Effort: Pulmonary effort is normal. No respiratory distress.     Breath sounds: Normal breath sounds.  Abdominal:     Palpations: Abdomen is soft.     Tenderness: There is no abdominal tenderness.  Musculoskeletal:        General: No swelling.     Cervical back: Neck supple.     Right lower leg: No tenderness. No edema.     Left lower leg: No tenderness. No edema.  Skin:    General: Skin is warm and dry.     Capillary Refill: Capillary refill takes less than 2 seconds.  Neurological:     Mental Status: He is alert.  Psychiatric:        Mood and Affect: Mood normal.     ED Results / Procedures / Treatments   Labs (all labs ordered are listed, but only abnormal results are displayed) Labs Reviewed  BASIC METABOLIC PANEL - Abnormal; Notable for the following components:      Result Value   Sodium 134 (*)    Glucose, Bld 132 (*)    All other components within normal limits  CBC - Abnormal; Notable for the following components:   Hemoglobin 17.6 (*)    All other components within normal limits  TSH  TROPONIN I (HIGH SENSITIVITY)  TROPONIN I (HIGH SENSITIVITY)    EKG None  Radiology DG Chest Port 1 View Result Date: 10/13/2023 CLINICAL DATA:  Chest pain, shortness of breath EXAM: PORTABLE CHEST 1 VIEW COMPARISON:  08/21/2023 FINDINGS: Lungs are clear.  No pleural effusion or pneumothorax. The heart is normal in size. IMPRESSION: No acute cardiopulmonary disease. Electronically Signed   By: Pinkie Pebbles M.D.   On: 10/13/2023 19:24    Procedures Procedures    Medications Ordered in ED Medications - No data to display  ED Course/ Medical Decision Making/ A&P Clinical Course as of  10/13/23 2206  Mon Oct 13, 2023  1814 Troponin I (High Sensitivity) [JT]  2120 Troponin I (High Sensitivity): 2 [JT]  2121 Troponin I (High Sensitivity) [JT]    Clinical Course User Index [JT] Donnajean Lynwood DEL, PA-C                                 Medical Decision Making Amount and/or Complexity of Data Reviewed Labs: ordered. Decision-making details documented in ED Course. Radiology: ordered.   This patient presents  to the ED with chief complaint(s) of chest pain and shortness of breath.  The complaint involves an extensive differential diagnosis and also carries with it a high risk of complications and morbidity.   pertinent past medical history as listed in HPI  The differential diagnosis includes  ACS, PE, aortic dissection, pneumonia, pneumothorax, pericarditis, myocarditis, anxiety The initial plan is to  Will obtain basic labs, chest x-ray and EKG Additional history obtained: Additional history obtained from spouse Records reviewed Care Everywhere/External Records  Initial Assessment:   Nontoxic-appearing presenting tachycardic to 120s with chest pain and short of breath.  Has chronic tachycardia despite starting metoprolol  a a year ago.  Additionally has a chronic cough, nonproductive or acute symptoms, afebrile to suggest pneumonia.  No risk factors to suggest PE, additionally not hypoxic.   Independent ECG interpretation:  Sinus tachycardia with occasional PVCs  Independent labs interpretation:  The following labs were independently interpreted:  CBC and BMP without significant abnormality, TSH within normal limits, troponins without elevation  Independent visualization and interpretation of imaging: I independently visualized the following imaging with scope of interpretation limited to determining acute life threatening conditions related to emergency care: Chest x-ray, which revealed no acute cardiopulmonary disease  Treatment and Reassessment: No medications  administered during visit  Consultations obtained:   none  Disposition:   Patient will be discharged home.  Encouraged to follow-up with primary care doctor within the next 3 to 5 days and keep his appointment with his pulmonologist on Monday. The patient has been appropriately medically screened and/or stabilized in the ED. I have low suspicion for any other emergent medical condition which would require further screening, evaluation or treatment in the ED or require inpatient management. At time of discharge the patient is hemodynamically stable and in no acute distress. I have discussed work-up results and diagnosis with patient and answered all questions. Patient is agreeable with discharge plan. We discussed strict return precautions for returning to the emergency department and they verbalized understanding.     Social Determinants of Health:   none  This note was dictated with voice recognition software.  Despite best efforts at proofreading, errors may have occurred which can change the documentation meaning.          Final Clinical Impression(s) / ED Diagnoses Final diagnoses:  Atypical chest pain  Shortness of breath    Rx / DC Orders ED Discharge Orders     None         Donnajean Lynwood DEL, PA-C 10/13/23 2210    Franklyn Sid SAILOR, MD 10/13/23 [phone]

## 2023-10-13 NOTE — ED Triage Notes (Signed)
 Sob, chest pain, started at 3:30pm

## 2023-10-13 NOTE — ED Notes (Signed)
 Pt had a moment of increase HR while standing to urinate by the bed... Pt stated that he was feeling SOB during the episode... After the PT was finished and sitting back in the bed, his HR started to decrease to roughly where its been the entire time (HR 100-110)... Pts SOB started to disappear... Provider informed... EKG taken.[email or web address]

## 2023-10-13 NOTE — ED Notes (Signed)
 Report given to the next RN.Marland KitchenMarland Kitchen

## 2023-10-13 NOTE — Discharge Instructions (Addendum)
 You were evaluated in the emergency room for chest pain and shortness of breath.  Your lab work, EKG and chest x-ray did not show any significant abnormality.  I would recommend you follow-up with your primary care doctor within the next 3 days.  If you experience any new or persistent symptoms please return to the emergency room.

## 2023-10-14 ENCOUNTER — Telehealth (HOSPITAL_COMMUNITY): Payer: Self-pay | Admitting: *Deleted

## 2023-10-14 DIAGNOSIS — R000 Tachycardia, unspecified: Secondary | ICD-10-CM

## 2023-10-14 NOTE — Telephone Encounter (Signed)
 Pt says he has been taking the second dose. He is requesting 1 mg BID. Pt was transferred to front desk for appointment.

## 2023-10-14 NOTE — Telephone Encounter (Signed)
 Pt called to advise that he went to ED [facility]) yesterday with CP. Pt was worked up and it was found that there was no cardiac involvement. Pt feels these episodes may be panic attacks which he says he has about 3 times a week. Pt is currently taking Ativan  0.5 mg 1 tablet every day and second every day PRN #[id number]. Pt was visit was on 09/15/23 and currently does not have appointment for f/u in place. Please review and advise.

## 2023-10-14 NOTE — Telephone Encounter (Signed)
 Ok. Will advise.

## 2023-10-14 NOTE — Telephone Encounter (Signed)
 He needs appointment but in the meanwhile he can take 2 pills a day.

## 2023-10-16 ENCOUNTER — Telehealth (HOSPITAL_COMMUNITY): Payer: Self-pay | Admitting: *Deleted

## 2023-10-16 ENCOUNTER — Other Ambulatory Visit (HOSPITAL_COMMUNITY): Payer: Self-pay | Admitting: *Deleted

## 2023-10-16 DIAGNOSIS — F4010 Social phobia, unspecified: Secondary | ICD-10-CM

## 2023-10-16 DIAGNOSIS — F410 Panic disorder [episodic paroxysmal anxiety] without agoraphobia: Secondary | ICD-10-CM

## 2023-10-16 MED ORDER — CLORAZEPATE DIPOTASSIUM 7.5 MG PO TABS: 7.5000 mg | ORAL_TABLET | Freq: Two times a day (BID) | ORAL | 0 refills | Status: DC

## 2023-10-16 NOTE — Telephone Encounter (Signed)
Pt now has appointment scheduled for 11/10/23 and is requesting prescription for Ativan 1 mg BID. Please review.

## 2023-10-16 NOTE — Telephone Encounter (Signed)
Pt called again today regarding increasing Ativan to 1 mg BID. Writer spoke with pt about this on 10/14/23, see encounter. LVM for pt advising that he call front desk for f/u appointment and that no other changes will be made to this medication at this time.

## 2023-10-16 NOTE — Telephone Encounter (Signed)
I spoke to patient about his anxiety attack.  He is taking Ativan 0.5 mg and even taking 2 tablet twice a day still feel anxious and nervous.  I recommend to try clorazepate 7.5 mg twice a day and discontinue Ativan.  He agreed with the plan.  He will keep the appointment in few weeks.

## 2023-10-19 ENCOUNTER — Other Ambulatory Visit: Payer: Self-pay | Admitting: Cardiovascular Disease

## 2023-10-20 ENCOUNTER — Encounter: Payer: Self-pay | Admitting: Pulmonary Disease

## 2023-10-20 ENCOUNTER — Ambulatory Visit: Payer: 59 | Admitting: Pulmonary Disease

## 2023-10-20 VITALS — BP 119/87 | HR 82 | Temp 98.1°F | Ht 75.0 in | Wt 282.4 lb

## 2023-10-20 DIAGNOSIS — R0609 Other forms of dyspnea: Secondary | ICD-10-CM

## 2023-10-20 DIAGNOSIS — J672 Bird fancier's lung: Secondary | ICD-10-CM

## 2023-10-20 MED ORDER — ALBUTEROL SULFATE HFA 108 (90 BASE) MCG/ACT IN AERS: 2.0000 | INHALATION_SPRAY | Freq: Four times a day (QID) | RESPIRATORY_TRACT | 6 refills | Status: DC | PRN

## 2023-10-20 MED ORDER — BUDESONIDE-FORMOTEROL FUMARATE 160-4.5 MCG/ACT IN AERO: 2.0000 | INHALATION_SPRAY | Freq: Two times a day (BID) | RESPIRATORY_TRACT | 12 refills | Status: DC

## 2023-10-20 NOTE — Progress Notes (Signed)
@Patient  ID: Paul Parrish, male    DOB: [birth year], 53 y.o.   MRN: [id number]  Chief Complaint  Patient presents with  . Follow-up    Pulmonary hypertension- June 2024- worsening last couple months     Referring provider: Dettinger, Elige Radon, MD  HPI:   53 y.o. male whom are seen for evaluation of dyspnea on exertion.  Most recent PCP note reviewed.  ED note 10/2023 reviewed.  Patient notes onset of breathing trouble summer 2024.  May or June.  Associated shortness of breath.  At rest and with exertion.  Typically worse with exertion.  Can occur at rest.  Sometimes a bit worse in the evening.  She is lying in bed and feels that he cannot breathe.  No seasonal environmental factors he can evaluate things better or worse.  No real alleviating or exacerbating factors.  Seem to get better with time.  Does not use inhalers.  Went to the ED 10/2023.  EKG and troponins okay.  He denies any breathing treatments or other medications to help treat his shortness of breath.  Things gradually got better.  Chest x-ray on my review interpretation was clear bilaterally on 1 view.  Most recent chest x-ray 08/2023 clear bilaterally on PA and lateral views on my review interpretation.  CT cardiac reviewed sick/2024 parenchyma clear on my review and interpretation.  He states good days and bad days.  Not reliably reproducible symptoms.  He does have parakeets in the home.  Denies history of asthma.  He is a never smoker.  Questionaires / Pulmonary Flowsheets:  He ACT:      No data to display          MMRC:     No data to display          Epworth:      No data to display          Tests:   FENO:  No results found for: "NITRICOXIDE"  PFT:     No data to display          WALK:      No data to display          Imaging: Personally reviewed and as per EMR and discussion in this note DG Chest Port 1 View Result Date: 10/13/2023 CLINICAL DATA:  Chest pain, shortness of breath  EXAM: PORTABLE CHEST 1 VIEW COMPARISON:  08/21/2023 FINDINGS: Lungs are clear.  No pleural effusion or pneumothorax. The heart is normal in size. IMPRESSION: No acute cardiopulmonary disease. Electronically Signed   By: Charline Bills M.D.   On: 10/13/2023 19:24    Lab Results: Personally reviewed CBC    Component Value Date/Time   WBC 7.4 10/13/2023 1708   RBC 5.71 10/13/2023 1708   HGB 17.6 (H) 10/13/2023 1708   HGB 17.9 (H) 09/29/2023 0957   HCT 49.1 10/13/2023 1708   HCT 52.7 (H) 09/29/2023 0957   PLT 316 10/13/2023 1708   PLT 240 09/29/2023 0957   MCV 86.0 10/13/2023 1708   MCV 92 09/29/2023 0957   MCH 30.8 10/13/2023 1708   MCHC 35.8 10/13/2023 1708   RDW 12.0 10/13/2023 1708   RDW 12.9 09/29/2023 0957   LYMPHSABS 2.3 09/29/2023 0957   MONOABS 0.8 10/22/2016 0448   EOSABS 0.1 09/29/2023 0957   BASOSABS 0.1 09/29/2023 0957    BMET    Component Value Date/Time   NA 134 (L) 10/13/2023 1708   NA 139 08/15/2023 0915   K  3.7 10/13/2023 1708   CL 99 10/13/2023 1708   CO2 26 10/13/2023 1708   GLUCOSE 132 (H) 10/13/2023 1708   BUN 13 10/13/2023 1708   BUN 10 08/15/2023 0915   CREATININE 1.22 10/13/2023 1708   CREATININE 1.12 01/07/2013 1647   CALCIUM 9.4 10/13/2023 1708   GFRNONAA >60 10/13/2023 1708   GFRNONAA 81 01/07/2013 1647   GFRAA 87 08/14/2020 1136   GFRAA >89 01/07/2013 1647    BNP No results found for: "BNP"  ProBNP No results found for: "PROBNP"  Specialty Problems   None   Allergies  Allergen Reactions  . Amoxicillin Other (See Comments)    High fever and possible heart racing  . Bactrim [Sulfamethoxazole-Trimethoprim] Other (See Comments)    Fever, body aches, chills  . Penicillins Other (See Comments)    High fever Has patient had a PCN reaction causing immediate rash, facial/tongue/throat swelling, SOB or lightheadedness with hypotension: No Has patient had a PCN reaction causing severe rash involving mucus membranes or skin necrosis:  No Has patient had a PCN reaction that required hospitalization No Has patient had a PCN reaction occurring within the last 10 years: No If all of the above answers are "NO", then may proceed with Cephalosporin use.     Immunization History  Administered Date(s) Administered  . Influenza,inj,Quad PF,6+ Mos 09/07/2013, 06/27/2014, 07/04/2015, 07/06/2017, 07/04/2018, 06/24/2022  . Influenza-Unspecified 09/17/2011, 07/05/2012, 09/28/2016, 07/06/2017, 07/31/2019, 07/22/2021, 06/16/2023  . Moderna Sars-Covid-2 Vaccination 12/18/2019, 01/19/2020  . Tdap 02/15/2021    Past Medical History:  Diagnosis Date  . Anxiety    on meds  . Depression    on meds  . Elevated hemoglobin (HCC)   . Low testosterone   . Sleep apnea    uses CPAP  . Small bowel obstruction (HCC) 10/21/2016    Tobacco History: Social History   Tobacco Use  Smoking Status Never  Smokeless Tobacco Never   Counseling given: Not Answered   Continue to not smoke  Outpatient Encounter Medications as of 10/20/2023  Medication Sig  . albuterol (VENTOLIN HFA) 108 (90 Base) MCG/ACT inhaler Inhale 2 puffs into the lungs every 6 (six) hours as needed for wheezing or shortness of breath.  . ARIPiprazole (ABILIFY) 5 MG tablet Take 1 tablet (5 mg total) by mouth daily.  . budesonide-formoterol (SYMBICORT) 160-4.5 MCG/ACT inhaler Inhale 2 puffs into the lungs 2 (two) times daily.  . clomiPRAMINE (ANAFRANIL) 75 MG capsule Take 1 capsule (75 mg total) by mouth at bedtime.  . clorazepate (TRANXENE) 7.5 MG tablet Take 1 tablet (7.5 mg total) by mouth 2 (two) times daily.  . fenofibrate (TRICOR) 48 MG tablet Take 1 tablet (48 mg total) by mouth daily.  . Melatonin 10 MG TABS Take 1 tablet by mouth at bedtime.  . metoprolol tartrate (LOPRESSOR) 25 MG tablet Take 1 tablet (25 mg total) by mouth 2 (two) times daily. Please call [phone] to schedule an April appointment for future refills. Thank you.  . Multiple Vitamin  (MULTI-VITAMIN DAILY PO) Take 1 capsule by mouth 2 (two) times daily. Name: Life Extensions  . Omega-3 Fatty Acids (FISH OIL) 1000 MG CAPS Take 1 capsule by mouth 2 (two) times daily.  . propranolol (INDERAL) 20 MG tablet Take 1 tablet (20 mg total) by mouth 4 (four) times daily. Only as needed for tachycardia/palpitations  . Risankizumab-rzaa (SKYRIZI PEN) 150 MG/ML SOAJ Inject 150 mg into the skin as directed. Every 12 weeks for maintenance.  . rosuvastatin (CRESTOR) 10 MG tablet  Take 1 tablet (10 mg total) by mouth daily.  . sildenafil (REVATIO) 20 MG tablet TAKE 1 TABLET BY MOUTH ONCE DAILY AS NEEDED  . SKYRIZI 150 MG/ML SOSY INJECT 150MG  SUBCUTANEOUSLY EVERY 12 WEEKS AS DIRECTED.  Marland Kitchen SYRINGE-NEEDLE, DISP, 3 ML 21G X 1-1/2" 3 ML MISC Use to inject testosterone every week  . testosterone cypionate (DEPOTESTOSTERONE CYPIONATE) 200 MG/ML injection 50 mg (0.25 ml) IM every 10 days  . traZODone (DESYREL) 100 MG tablet Take 1 tablet (100 mg total) by mouth at bedtime as needed for sleep.   No facility-administered encounter medications on file as of 10/20/2023.     Review of Systems  Review of Systems  No chest pain reliably with exertion.  No orthopnea or PND.  Comprehensive review of systems otherwise negative. Physical Exam  BP 119/87 (BP Location: Left Arm, Patient Position: Sitting, Cuff Size: Large)   Pulse 82   Temp 98.1 F (36.7 C) (Oral)   Ht 6\' 3"  (1.905 m)   Wt 282 lb 6.4 oz (128.1 kg)   SpO2 97%   BMI 35.30 kg/m   Wt Readings from Last 5 Encounters:  10/20/23 282 lb 6.4 oz (128.1 kg)  10/06/23 282 lb 6.4 oz (128.1 kg)  08/21/23 283 lb (128.4 kg)  06/03/23 268 lb 3.2 oz (121.7 kg)  04/11/23 268 lb 12.8 oz (121.9 kg)    BMI Readings from Last 5 Encounters:  10/20/23 35.30 kg/m  10/06/23 36.26 kg/m  08/21/23 36.34 kg/m  06/03/23 34.43 kg/m  04/11/23 34.51 kg/m     Physical Exam General: Sitting in chair, no acute distress Eyes: EOMI, no icterus Neck:  Supple, no JVP Pulmonary: Clear, no work review Cardiac: Regular rate and rhythm, no murmur, warm, no edema Abdomen: Nondistended, bowel is present MSK: No synovitis, no joint effusion Neuro: Normal gait, no weakness Psych: Normal mood, full affect   Assessment & Plan:   Dyspnea on exertion: Intermittent, not reliably reproducible.  Associated with chest tightness.  EKG and troponin within normal limits.  CT cardiac reassuring 03/2023.  Suspicious for possible asthma or small airways disease given this finding.  He has birds at home, parakeets.  Development of HSP, hypersensitive pneumonitis possible.  PFTs for further evaluation.  High-resolution CT scan to further evaluate parenchyma, notably prior cross-sectional imaging excluding the apices, upper lobes, are clear.  Trial of Symbicort high-dose 2 puffs twice a day for maintenance inhaler, albuterol as needed.  Assess response.  Polycythemia: Normal oxygen saturation today.  Given habitus possible desaturations OSA, nocturnal hypoxemia at night.  Reassess after additional investigation as above, likely would benefit from home sleep study.   Return in about 3 months (around 01/18/2024) for f/u Dr. Judeth Horn, after PFT, after CT scan.   Karren Burly, MD 10/20/2023   This appointment required 61 minutes of patient care (this includes precharting, chart review, review of results, face-to-face care, etc.).

## 2023-10-20 NOTE — Patient Instructions (Addendum)
Nice to meet you  To further investigate your symptoms I ordered pulmonary function test, you may schedule these at the front desk when you leave at your earliest convenience  To further investigate your symptoms especially with birds in the home, I ordered a special CT scan of the chest to evaluate for changes that could be related to birds  Use albuterol 2 puffs every 4-6 hours as needed for shortness of breath or chest tightness.  This is a rescue inhaler.  Use as needed.  Use Symbicort 2 puffs twice a day every day, morning and evening.  Rinse mouth out thoroughly with water after every use.  This is a maintenance inhaler, I want to use it twice a day every day even if feeling well.  If the inhalers are too expensive please see me a message and I will look for a more cost effective solution.  Return to clinic in 3 months or sooner as needed with Dr. Judeth Horn

## 2023-10-21 ENCOUNTER — Telehealth (HOSPITAL_COMMUNITY): Payer: Self-pay | Admitting: *Deleted

## 2023-10-21 NOTE — Telephone Encounter (Signed)
Yes ativan d/c.

## 2023-10-21 NOTE — Telephone Encounter (Signed)
Pt called requesting a call to [facility] pharmacy in [place] to clarify medication order for the Tranxene 7.5 mg tabs 1 po bid for anxiety. Pt has not been able to fill prescription e-scribed on 10/16/23. Writer spoke with PhD at [facility] who needed to confirm that the Ativan 0.5 mg has been d/c. This nurse verified d/c of Ativan and start Tranxene per Dr. Sheela Stack order on 10/16/23. LVM for pt to advise.

## 2023-10-23 ENCOUNTER — Encounter: Payer: Self-pay | Admitting: Pulmonary Disease

## 2023-10-23 ENCOUNTER — Encounter: Payer: Self-pay | Admitting: Cardiovascular Disease

## 2023-10-28 ENCOUNTER — Encounter: Payer: Self-pay | Admitting: Cardiovascular Disease

## 2023-10-30 ENCOUNTER — Telehealth: Payer: Self-pay

## 2023-10-30 MED ORDER — METOPROLOL TARTRATE 50 MG PO TABS: 50.0000 mg | ORAL_TABLET | Freq: Two times a day (BID) | ORAL | 3 refills | Status: DC

## 2023-10-30 NOTE — Telephone Encounter (Signed)
-----   Message from Kristeen Miss sent at 10/28/2023  5:15 PM EST ----- Still having episodes of SVT .  He is on metoprolol 25 PO BID . Will increase his metoprolol to 50 mg PO BID I have called and discussed with  his wife .

## 2023-10-30 NOTE — Telephone Encounter (Signed)
Metoprolol 50mg  twice daily sent to pharmacy and [facility] updated.

## 2023-11-04 NOTE — Telephone Encounter (Signed)
 Error

## 2023-11-06 ENCOUNTER — Encounter: Payer: Self-pay | Admitting: Cardiovascular Disease

## 2023-11-06 NOTE — Progress Notes (Signed)
 Cardiology Office Note:    Date:  11/13/2023   ID:  Paul Parrish, DOB 13-May-1971, MRN [id number]  PCP:  Dettinger, Fonda LABOR, MD   [facility] Providers Cardiologist:  Levina Boyack   Referring MD: Dettinger, Fonda LABOR, MD   Chief Complaint  Patient presents with   Hypertension        Tachycardia           Jan. 16, 2023    Paul Parrish is a 53 y.o. male with a hx of anxiety, depression , anemia , OSA,  [facility]  The last several times he has attempted to give , [facility] refused [facility] to give [facility]  Has had fatigue , lack of energy ,   Naps frequently  Has OSA - his CPAP mask is working properly ,  Wakes up feeling well .      Has been present for the past several months ,   Has the impression that his HR has been fast   No weight loss,  no sweats, no diarrhea   Works at home ( [employer] )   We started metoprolol  on Sat PM - he seems to be feeling better  HR is better.  We discussed getting an echo . Given the cost, we will hold off for now and see if he feels better after being on the meds for several months     January 10, 2023 Paul Parrish is seen for follow up of his tachycardia, fatigue  Has OSA , CPAP has not been working well  Hx of anemia    Goes to the gym regularly  Is limited by his back at times Is watching his diet  Wt is 267 lbs ( down 15 lbs from Jan. 2023)  Needs to get a new CPAP  No cp, no dyspnea   Feb. 7, 2025 Paul Parrish is seen for follow up of his tachycardia He was seen in the ER on Jan. 13  with atypical CP  He has been having more palpitations for the past several months   ECG showed sinus tachycardia in 120s Event monitor reveals episodes of SVT  - the episodes were from 13 - 16 beats with a max HR of 194   EKG at [facility] from October 13, 2023 shows sinus tachycardia at 112.  No ST or T wave changes. We increased his metoprolol  to 50 mg PO BID He is feeling better on the  higher dose   Is not getting regular exercise  Wt is 179   He has not been wearing his CPAP ,, has known OSA              Past Medical History:  Diagnosis Date   Anxiety    on meds   Depression    on meds   Elevated hemoglobin (HCC)    Low testosterone     Sleep apnea    uses CPAP   Small bowel obstruction (HCC) 10/21/2016    Past Surgical History:  Procedure Laterality Date   COLONOSCOPY  08/17/2021   LUMBAR SPINE SURGERY  12/2020   NASAL SINUS SURGERY  02/2016   WISDOM TOOTH EXTRACTION      Current Medications: Current Meds  Medication Sig   albuterol  (VENTOLIN  HFA) 108 (90 Base) MCG/ACT inhaler Inhale 2 puffs into the lungs every 6 (six) hours as needed for wheezing or shortness of breath.   budesonide -formoterol  (SYMBICORT ) 160-4.5 MCG/ACT inhaler  Inhale 2 puffs into the lungs 2 (two) times daily.   fenofibrate  (TRICOR ) 48 MG tablet Take 1 tablet (48 mg total) by mouth daily.   Melatonin 10 MG TABS Take 1 tablet by mouth at bedtime.   metoprolol  tartrate (LOPRESSOR ) 50 MG tablet Take 1 tablet (50 mg total) by mouth 2 (two) times daily.   Multiple Vitamin (MULTI-VITAMIN DAILY PO) Take 1 capsule by mouth 2 (two) times daily. Name: Life Extensions   Omega-3 Fatty Acids (FISH OIL) 1000 MG CAPS Take 1 capsule by mouth 2 (two) times daily.   propranolol  (INDERAL ) 20 MG tablet Take 1 tablet (20 mg total) by mouth 4 (four) times daily. Only as needed for tachycardia/palpitations   Risankizumab -rzaa (SKYRIZI  PEN) 150 MG/ML SOAJ Inject 150 mg into the skin as directed. Every 12 weeks for maintenance.   rosuvastatin  (CRESTOR ) 10 MG tablet Take 1 tablet (10 mg total) by mouth daily.   sildenafil  (REVATIO ) 20 MG tablet TAKE 1 TABLET BY MOUTH ONCE DAILY AS NEEDED   SKYRIZI  150 MG/ML SOSY INJECT 150MG  SUBCUTANEOUSLY EVERY 12 WEEKS AS DIRECTED.   SYRINGE-NEEDLE, DISP, 3 ML 21G X 1-1/2 3 ML MISC Use to inject testosterone  every week   testosterone  cypionate (DEPOTESTOSTERONE  CYPIONATE) 200 MG/ML injection 50 mg (0.25 ml) IM every 10 days   [DISCONTINUED] ARIPiprazole  (ABILIFY ) 5 MG tablet Take 1 tablet (5 mg total) by mouth daily.   [DISCONTINUED] clomiPRAMINE  (ANAFRANIL ) 75 MG capsule Take 1 capsule (75 mg total) by mouth at bedtime.   [DISCONTINUED] clorazepate  (TRANXENE ) 7.5 MG tablet Take 1 tablet (7.5 mg total) by mouth 2 (two) times daily.   [DISCONTINUED] traZODone  (DESYREL ) 100 MG tablet Take 1 tablet (100 mg total) by mouth at bedtime as needed for sleep.     Allergies:   Amoxicillin, Bactrim [sulfamethoxazole-trimethoprim], and Penicillins   Social History   Socioeconomic History   Marital status: Married    Spouse name: Not on file   Number of children: Not on file   Years of education: Not on file   Highest education level: Not on file  Occupational History   Not on file  Tobacco Use   Smoking status: Never   Smokeless tobacco: Never  Vaping Use   Vaping status: Never Used  Substance and Sexual Activity   Alcohol use: Not Currently    Comment: 10/22/2016 nothing in over 1 year   Drug use: No   Sexual activity: Yes    Partners: Female    Birth control/protection: None  Other Topics Concern   Not on file  Social History Narrative   Not on file   Social Drivers of Health   Financial Resource Strain: Not on file  Food Insecurity: Not on file  Transportation Needs: Not on file  Physical Activity: Not on file  Stress: Not on file  Social Connections: Not on file     Family History: The patient's family history includes Cancer in his paternal grandmother; Diabetes in his brother and father; Diverticulitis in his mother; Hypertension in his father; Kidney cancer in his mother; Liver cancer in his mother.  ROS:   Please see the history of present illness.     All other systems reviewed and are negative.  EKGs/Labs/Other Studies Reviewed:    The following studies were reviewed today:   EKG:      Recent Labs: 08/15/2023: ALT  44 10/13/2023: BUN 13; Creatinine, Ser 1.22; Hemoglobin 17.6; Platelets 316; Potassium 3.7; Sodium 134; TSH 2.143  Recent Lipid Panel  Component Value Date/Time   CHOL 233 (H) 08/15/2023 0915   TRIG 341 (H) 08/15/2023 0915   HDL 29 (L) 08/15/2023 0915   CHOLHDL 8.0 (H) 08/15/2023 0915   LDLCALC 141 (H) 08/15/2023 0915     Risk Assessment/Calculations:           Physical Exam:     Physical Exam: Blood pressure 114/88, pulse 65, height 6' 2 (1.88 m), weight 279 lb 9.6 oz (126.8 kg), SpO2 98%.    GEN:  Well nourished, well developed in no acute distress HEENT: Normal NECK: No JVD; No carotid bruits LYMPHATICS: No lymphadenopathy CARDIAC: RRR , no murmurs, rubs, gallops RESPIRATORY:  Clear to auscultation without rales, wheezing or rhonchi  ABDOMEN: Soft, non-tender, non-distended MUSCULOSKELETAL:  No edema; No deformity  SKIN: Warm and dry NEUROLOGIC:  Alert and oriented x 3     ASSESSMENT:    1. SVT (supraventricular tachycardia) (HCC)     PLAN:       Sinus tachycardia:    Paul Parrish is feeling better after increasing his metoprolol  to 50 mg BID.   He has OSA but is not wearing his CPAP.  I've encouraged him to find a CPAP mask that he is able to wear .  Uncontrolled OSA will cause / contribute to sinus tach   2.  Obesity:    he has gained ~20-30 lbs over the past several months .   It has been a very stressful time for the family.   I've asked him to get back on a diet.   They have just started going back to the gym.     3.  Hyperlipidemia:  continue rosuvastatin  .               Medication Adjustments/Labs and Tests Ordered: Current medicines are reviewed at length with the patient today.  Concerns regarding medicines are outlined above.  No orders of the defined types were placed in this encounter.  No orders of the defined types were placed in this encounter.   Patient Instructions  Follow-Up: At [facility], you and your health needs  are our priority.  As part of our continuing mission to provide you with exceptional heart care, we have created designated Provider Care Teams.  These Care Teams include your primary Cardiologist (physician) and Advanced Practice Providers (APPs -  Physician Assistants and Nurse Practitioners) who all work together to provide you with the care you need, when you need it.  We recommend signing up for the patient portal called MyChart.  Sign up information is provided on this After Visit Summary.  MyChart is used to connect with patients for Virtual Visits (Telemedicine).  Patients are able to view lab/test results, encounter notes, upcoming appointments, etc.  Non-urgent messages can be sent to your provider as well.   To learn more about what you can do with MyChart, go to [email or web address].    Your next appointment:   6 month(s)  Provider:   Dr. Jeffrie, MD  Other Instructions   1st Floor: - Lobby - Registration  - Pharmacy  - Lab - Cafe  2nd Floor: - PV Lab - Diagnostic Testing (echo, CT, nuclear med)  3rd Floor: - Vacant  4th Floor: - TCTS (cardiothoracic surgery) - AFib Clinic - Structural Heart Clinic - Vascular Surgery  - Vascular Ultrasound  5th Floor: - HeartCare Cardiology (general and EP) - Clinical Pharmacy for coumadin, hypertension, lipid, weight-loss medications, and med management appointments    Valet parking services  will be available as well.          Catha is a smart device that can record a medical-grade electrocardiogram (EKG) right on your smartphone. EKGs measure the electrical activity of your heart and are used in hospitals to detect irregularities with your heart rate or rhythm, which may be indicators of a heart condition such as atrial fibrillation (AFib). KardiaMobile records a single-lead EKG, which provides you and your doctor with reliable information on your heart health. KardiaMobile can detect AFib, Bradycardia, and  Tachycardia, with more determinations available with a [employer]. Catha is clinically validated, CE marked, and FDA-cleared, making it one of the most reliable ways to check in on your heart from home. Kardia Mobile device by [employer]  is approximately $90 and the phone application is free.  The web site is:  [email or web address].alivecor.[email or web address]    Kardia Mobile - sending an EKG Download app and set up profile. Run EKG - by placing 1-2 fingers on the silver plates After EKG is complete - Download PDF - Skip password (if you apply a password the provider will need it to view the EKG) Click share button (square with upward arrow) in bottom left corner To send: choose MyChart (first time log into MyChart) Pop up window about sending ECG Click continue Choose type of message Choose provider Type subject and message Click send (EKG should be attached) - To send additional EKGs in one message click the paperclip image and bottom of page to attach.     Signed, Aleene Passe, MD  11/13/2023 9:10 AM    [facility]

## 2023-11-07 ENCOUNTER — Encounter: Payer: Self-pay | Admitting: Cardiovascular Disease

## 2023-11-07 ENCOUNTER — Ambulatory Visit: Payer: 59 | Attending: Cardiovascular Disease | Admitting: Cardiovascular Disease

## 2023-11-07 VITALS — BP 114/88 | HR 65 | Ht 74.0 in | Wt 279.6 lb

## 2023-11-07 DIAGNOSIS — I471 Supraventricular tachycardia, unspecified: Secondary | ICD-10-CM

## 2023-11-07 NOTE — Patient Instructions (Addendum)
 Follow-Up: At [facility], you and your health needs are our priority.  As part of our continuing mission to provide you with exceptional heart care, we have created designated Provider Care Teams.  These Care Teams include your primary Cardiologist (physician) and Advanced Practice Providers (APPs -  Physician Assistants and Nurse Practitioners) who all work together to provide you with the care you need, when you need it.  We recommend signing up for the patient portal called MyChart.  Sign up information is provided on this After Visit Summary.  MyChart is used to connect with patients for Virtual Visits (Telemedicine).  Patients are able to view lab/test results, encounter notes, upcoming appointments, etc.  Non-urgent messages can be sent to your provider as well.   To learn more about what you can do with MyChart, go to [email or web address].    Your next appointment:   6 month(s)  Provider:   Dr. Jeffrie, MD  Other Instructions   1st Floor: - Lobby - Registration  - Pharmacy  - Lab - Cafe  2nd Floor: - PV Lab - Diagnostic Testing (echo, CT, nuclear med)  3rd Floor: - Vacant  4th Floor: - TCTS (cardiothoracic surgery) - AFib Clinic - Structural Heart Clinic - Vascular Surgery  - Vascular Ultrasound  5th Floor: - HeartCare Cardiology (general and EP) - Clinical Pharmacy for coumadin, hypertension, lipid, weight-loss medications, and med management appointments    Valet parking services will be available as well.          Catha is a smart device that can record a medical-grade electrocardiogram (EKG) right on your smartphone. EKGs measure the electrical activity of your heart and are used in hospitals to detect irregularities with your heart rate or rhythm, which may be indicators of a heart condition such as atrial fibrillation (AFib). [employer] records a single-lead EKG, which provides you and your doctor with reliable information on your  heart health. [employer]ia[employer] can detect AFib, Bradycardia, and Tachycardia, with more determinations available with a [employer]. Catha is clinically validated, CE marked, and FDA-cleared, making it one of the most reliable ways to check in on your heart from home. Kardia Mobile device by [employer]  is approximately $90 and the phone application is free.  The web site is:  https://www.alivecor.com    Kardia Mobile - sending an EKG Download app and set up profile. Run EKG - by placing 1-2 fingers on the silver plates After EKG is complete - Download PDF - Skip password (if you apply a password the provider will need it to view the EKG) Click share button (square with upward arrow) in bottom left corner To send: choose MyChart (first time log into MyChart) Pop up window about sending ECG Click continue Choose type of message Choose provider Type subject and message Click send (EKG should be attached) - To send additional EKGs in one message click the paperclip image and bottom of page to attach.

## 2023-11-10 ENCOUNTER — Encounter (HOSPITAL_COMMUNITY): Payer: Self-pay | Admitting: Psychiatry

## 2023-11-10 ENCOUNTER — Telehealth (HOSPITAL_BASED_OUTPATIENT_CLINIC_OR_DEPARTMENT_OTHER): Payer: 59 | Admitting: Psychiatry

## 2023-11-10 VITALS — Wt 279.0 lb

## 2023-11-10 DIAGNOSIS — F4010 Social phobia, unspecified: Secondary | ICD-10-CM | POA: Diagnosis not present

## 2023-11-10 DIAGNOSIS — F331 Major depressive disorder, recurrent, moderate: Secondary | ICD-10-CM

## 2023-11-10 DIAGNOSIS — F410 Panic disorder [episodic paroxysmal anxiety] without agoraphobia: Secondary | ICD-10-CM

## 2023-11-10 MED ORDER — ARIPIPRAZOLE 15 MG PO TABS: 7.5000 mg | ORAL_TABLET | Freq: Every day | ORAL | 2 refills | Status: DC

## 2023-11-10 MED ORDER — CLOMIPRAMINE HCL 75 MG PO CAPS: 75.0000 mg | ORAL_CAPSULE | Freq: Every day | ORAL | 2 refills | Status: DC

## 2023-11-10 MED ORDER — CLORAZEPATE DIPOTASSIUM 7.5 MG PO TABS: 7.5000 mg | ORAL_TABLET | Freq: Two times a day (BID) | ORAL | 2 refills | Status: DC

## 2023-11-10 MED ORDER — TRAZODONE HCL 100 MG PO TABS: 100.0000 mg | ORAL_TABLET | Freq: Every evening | ORAL | 2 refills | Status: DC | PRN

## 2023-11-10 NOTE — Progress Notes (Signed)
 [facility] MD Virtual Progress Note   Patient Location: Home Provider Location: Home Office  I connect with patient by telephone and verified that I am speaking with correct person by using two identifiers. I discussed the limitations of evaluation and management by telemedicine and the availability of in person appointments. I also discussed with the patient that there may be a patient responsible charge related to this service. The patient expressed understanding and agreed to proceed.  Paul Parrish [id number] 53 y.o.  11/10/2023 3:41 PM  History of Present Illness:  Patient is evaluated by phone session.  He is not taking clorazepate  7.5 mg twice a day and he noticed much improvement in his anxiety and denies any major panic attack.  However he is still struggling with depression chronic fatigue, lack of motivation and hopelessness.  He does not leave the house as frequently and last week he did not go outside.  His overall anxiety is much better and able to go to the grocery stores in public places without feeling overwhelmed.  He had a anxiety attack and seen in the emergency room few weeks ago and he has cardiac workup which was negative.  He is sleeping good with the help of trazodone  and CPAP.  Denies any suicidal thoughts.  His appetite is okay.  His weight is stable.  Lives with his wife and start driving.  He denies any agitation, anger, mania, aggression or violence.  I reviewed blood work results.  His testosterone  level is low.  Patient told his endocrinologist does not want to increase the testosterone  because of high hemoglobin.  Past Psychiatric History: H/O anxiety and depression. Saw provider at neuropsychiatry in [place] but not happy with the treatment.  Saw Dr. Milton Alpers in 2018 and given Prozac , Xanax , Effexor , Celexa , propanolol, Lamictal , Vistaril , Adderall, Wellbutrin ,  Cymbalta  (increased irritability ), Anafranil , Pristiq , Trintellix  and Luvox  (did not  help). H/O of ECT with no response.  Had genetic testing in 2019.  Paxil, Lexapro, Celexa  and Tegretol had significant drug interaction.  No h/o suicidal attempt or inpatient. At [facility] tried Viibryd , Klonopin  but did not work. H/O heavy drinking but claims to be sober.      Outpatient Encounter Medications as of 11/10/2023  Medication Sig   albuterol  (VENTOLIN  HFA) 108 (90 Base) MCG/ACT inhaler Inhale 2 puffs into the lungs every 6 (six) hours as needed for wheezing or shortness of breath.   ARIPiprazole  (ABILIFY ) 5 MG tablet Take 1 tablet (5 mg total) by mouth daily.   budesonide -formoterol  (SYMBICORT ) 160-4.5 MCG/ACT inhaler Inhale 2 puffs into the lungs 2 (two) times daily.   clomiPRAMINE  (ANAFRANIL ) 75 MG capsule Take 1 capsule (75 mg total) by mouth at bedtime.   clorazepate  (TRANXENE ) 7.5 MG tablet Take 1 tablet (7.5 mg total) by mouth 2 (two) times daily.   fenofibrate  (TRICOR ) 48 MG tablet Take 1 tablet (48 mg total) by mouth daily.   Melatonin 10 MG TABS Take 1 tablet by mouth at bedtime.   metoprolol  tartrate (LOPRESSOR ) 50 MG tablet Take 1 tablet (50 mg total) by mouth 2 (two) times daily.   Multiple Vitamin (MULTI-VITAMIN DAILY PO) Take 1 capsule by mouth 2 (two) times daily. Name: Life Extensions   Omega-3 Fatty Acids (FISH OIL) 1000 MG CAPS Take 1 capsule by mouth 2 (two) times daily.   propranolol  (INDERAL ) 20 MG tablet Take 1 tablet (20 mg total) by mouth 4 (four) times daily. Only as needed for tachycardia/palpitations   Risankizumab -rzaa (SKYRIZI  PEN)  150 MG/ML SOAJ Inject 150 mg into the skin as directed. Every 12 weeks for maintenance.   rosuvastatin  (CRESTOR ) 10 MG tablet Take 1 tablet (10 mg total) by mouth daily.   sildenafil  (REVATIO ) 20 MG tablet TAKE 1 TABLET BY MOUTH ONCE DAILY AS NEEDED   SKYRIZI  150 MG/ML SOSY INJECT 150MG  SUBCUTANEOUSLY EVERY 12 WEEKS AS DIRECTED.   SYRINGE-NEEDLE, DISP, 3 ML 21G X 1-1/2" 3 ML MISC Use to inject testosterone  every week    testosterone  cypionate (DEPOTESTOSTERONE CYPIONATE) 200 MG/ML injection 50 mg (0.25 ml) IM every 10 days   traZODone  (DESYREL ) 100 MG tablet Take 1 tablet (100 mg total) by mouth at bedtime as needed for sleep.   No facility-administered encounter medications on file as of 11/10/2023.    Recent Results (from the past 2160 hours)  Bayer DCA Hb A1c Waived     Status: None   Collection Time: 08/15/23  9:13 AM  Result Value Ref Range   HB A1C (BAYER DCA - WAIVED) 5.4 4.8 - 5.6 %    Comment:          Prediabetes: 5.7 - 6.4          Diabetes: >6.4          Glycemic control for adults with diabetes: <7.0   Lipid panel     Status: Abnormal   Collection Time: 08/15/23  9:15 AM  Result Value Ref Range   Cholesterol, Total 233 (H) 100 - 199 mg/dL   Triglycerides 409 (H) 0 - 149 mg/dL   HDL 29 (L) >81 mg/dL   VLDL Cholesterol Cal 63 (H) 5 - 40 mg/dL   LDL Chol Calc (NIH) 191 (H) 0 - 99 mg/dL   Chol/HDL Ratio 8.0 (H) 0.0 - 5.0 ratio    Comment:                                   T. Chol/HDL Ratio                                             Men  Women                               1/2 Avg.Risk  3.4    3.3                                   Avg.Risk  5.0    4.4                                2X Avg.Risk  9.6    7.1                                3X Avg.Risk 23.4   11.0   CBC with Differential/Platelet     Status: Abnormal   Collection Time: 08/15/23  9:15 AM  Result Value Ref Range   WBC 7.5 3.4 - 10.8 x10E3/uL   RBC 6.01 (H) 4.14 - 5.80 x10E6/uL   Hemoglobin 18.5 (H) 13.0 - 17.7 g/dL   Hematocrit  55.0 (H) 37.5 - 51.0 %   MCV 92 79 - 97 fL   MCH 30.8 26.6 - 33.0 pg   MCHC 33.6 31.5 - 35.7 g/dL   RDW 14.7 82.9 - 56.2 %   Platelets 248 150 - 450 x10E3/uL   Neutrophils 45 Not Estab. %   Lymphs 43 Not Estab. %   Monocytes 9 Not Estab. %   Eos 2 Not Estab. %   Basos 1 Not Estab. %   Neutrophils Absolute 3.3 1.4 - 7.0 x10E3/uL   Lymphocytes Absolute 3.2 (H) 0.7 - 3.1 x10E3/uL   Monocytes  Absolute 0.7 0.1 - 0.9 x10E3/uL   EOS (ABSOLUTE) 0.2 0.0 - 0.4 x10E3/uL   Basophils Absolute 0.1 0.0 - 0.2 x10E3/uL   Immature Granulocytes 0 Not Estab. %   Immature Grans (Abs) 0.0 0.0 - 0.1 x10E3/uL  CMP14+EGFR     Status: None   Collection Time: 08/15/23  9:15 AM  Result Value Ref Range   Glucose 97 70 - 99 mg/dL   BUN 10 6 - 24 mg/dL   Creatinine, Ser 1.30 0.76 - 1.27 mg/dL   eGFR 83 >86 VH/QIO/9.62   BUN/Creatinine Ratio 9 9 - 20   Sodium 139 134 - 144 mmol/L   Potassium 4.6 3.5 - 5.2 mmol/L   Chloride 97 96 - 106 mmol/L   CO2 24 20 - 29 mmol/L   Calcium  9.8 8.7 - 10.2 mg/dL   Total Protein 7.6 6.0 - 8.5 g/dL   Albumin 4.8 3.8 - 4.9 g/dL   Globulin, Total 2.8 1.5 - 4.5 g/dL   Bilirubin Total 0.6 0.0 - 1.2 mg/dL   Alkaline Phosphatase 59 44 - 121 IU/L   AST 30 0 - 40 IU/L   ALT 44 0 - 44 IU/L  Testosterone , Free, Total, SHBG     Status: Abnormal   Collection Time: 09/29/23  9:57 AM  Result Value Ref Range   Testosterone  192 (L) 264 - 916 ng/dL    Comment: Adult male reference interval is based on a population of healthy nonobese males (BMI <30) between 28 and 1 years old. Travison, et.al. JCEM [phone]. PMID: 25366440.    Testosterone , Free 6.3 (L) 7.2 - 24.0 pg/mL   Sex Hormone Binding 22.0 19.3 - 76.4 nmol/L  CBC with Differential/Platelet     Status: Abnormal   Collection Time: 09/29/23  9:57 AM  Result Value Ref Range   WBC 6.7 3.4 - 10.8 x10E3/uL   RBC 5.74 4.14 - 5.80 x10E6/uL   Hemoglobin 17.9 (H) 13.0 - 17.7 g/dL   Hematocrit 34.7 (H) 42.5 - 51.0 %   MCV 92 79 - 97 fL   MCH 31.2 26.6 - 33.0 pg   MCHC 34.0 31.5 - 35.7 g/dL   RDW 95.6 38.7 - 56.4 %   Platelets 240 150 - 450 x10E3/uL   Neutrophils 54 Not Estab. %   Lymphs 34 Not Estab. %   Monocytes 9 Not Estab. %   Eos 2 Not Estab. %   Basos 1 Not Estab. %   Neutrophils Absolute 3.6 1.4 - 7.0 x10E3/uL   Lymphocytes Absolute 2.3 0.7 - 3.1 x10E3/uL   Monocytes Absolute 0.6 0.1 - 0.9 x10E3/uL    EOS (ABSOLUTE) 0.1 0.0 - 0.4 x10E3/uL   Basophils Absolute 0.1 0.0 - 0.2 x10E3/uL   Immature Granulocytes 0 Not Estab. %   Immature Grans (Abs) 0.0 0.0 - 0.1 x10E3/uL  Basic metabolic panel     Status: Abnormal   Collection  Time: 10/13/23  5:08 PM  Result Value Ref Range   Sodium 134 (L) 135 - 145 mmol/L   Potassium 3.7 3.5 - 5.1 mmol/L   Chloride 99 98 - 111 mmol/L   CO2 26 22 - 32 mmol/L   Glucose, Bld 132 (H) 70 - 99 mg/dL    Comment: Glucose reference range applies only to samples taken after fasting for at least 8 hours.   BUN 13 6 - 20 mg/dL   Creatinine, Ser 8.29 0.61 - 1.24 mg/dL   Calcium  9.4 8.9 - 10.3 mg/dL   GFR, Estimated >56 >21 mL/min    Comment: (NOTE) Calculated using the CKD-EPI Creatinine Equation (2021)    Anion gap 9 5 - 15    Comment: Performed at [facility], [address], [place], [place] [address]  CBC     Status: Abnormal   Collection Time: 10/13/23  5:08 PM  Result Value Ref Range   WBC 7.4 4.0 - 10.5 K/uL   RBC 5.71 4.22 - 5.81 MIL/uL   Hemoglobin 17.6 (H) 13.0 - 17.0 g/dL   HCT 78.4 69.6 - 29.5 %   MCV 86.0 80.0 - 100.0 fL   MCH 30.8 26.0 - 34.0 pg   MCHC 35.8 30.0 - 36.0 g/dL   RDW 28.4 13.2 - 44.0 %   Platelets 316 150 - 400 K/uL   nRBC 0.0 0.0 - 0.2 %    Comment: Performed at [facility], [address] [place], [place] [address]  Troponin I (High Sensitivity)     Status: None   Collection Time: 10/13/23  5:08 PM  Result Value Ref Range   Troponin I (High Sensitivity) 2 <18 ng/L    Comment: (NOTE) Elevated high sensitivity troponin I (hsTnI) values and significant  changes across serial measurements may suggest ACS but many other  chronic and acute conditions are known to elevate hsTnI results.  Refer to the "Links" section for chest pain algorithms and additional  guidance. Performed at [facility], [address] [place], [place] [address]   TSH     Status: None    Collection Time: 10/13/23  7:00 PM  Result Value Ref Range   TSH 2.143 0.350 - 4.500 uIU/mL    Comment: Performed by a 3rd Generation assay with a functional sensitivity of <=0.01 uIU/mL. Performed at [facility], [address] [place], [place] [address]   Troponin I (High Sensitivity)     Status: None   Collection Time: 10/13/23  7:08 PM  Result Value Ref Range   Troponin I (High Sensitivity) 2 <18 ng/L    Comment: (NOTE) Elevated high sensitivity troponin I (hsTnI) values and significant  changes across serial measurements may suggest ACS but many other  chronic and acute conditions are known to elevate hsTnI results.  Refer to the "Links" section for chest pain algorithms and additional  guidance. Performed at [facility], [address], [place], [place] [address]      Psychiatric Specialty Exam: Physical Exam  Review of Systems  Weight 279 lb (126.6 kg).There is no height or weight on file to calculate BMI.  General Appearance: NA  Eye Contact:  NA  Speech:  Slow  Volume:  Decreased  Mood:  Depressed and Dysphoric  Affect:  NA  Thought Process:  Descriptions of Associations: Intact  Orientation:  Full (Time, Place, and Person)  Thought Content:  Rumination  Suicidal Thoughts:  No  Homicidal Thoughts:  No  Memory:  Immediate;   Good Recent;   Good Remote;   Fair  Judgement:  Intact  Insight:  Present  Psychomotor Activity:  Decreased  Concentration:  Concentration: Fair and Attention Span: Good  Recall:  Good  Fund of Knowledge:  Good  Language:  Good  Akathisia:  No  Handed:  Right  AIMS (if indicated):     Assets:  Communication Skills Desire for Improvement Housing Social Support Transportation  ADL's:  Intact  Cognition:  WNL  Sleep:  ok     Assessment/Plan: Moderate episode of recurrent major depressive disorder (HCC) - Plan: ARIPiprazole  (ABILIFY ) 15 MG tablet, clomiPRAMINE  (ANAFRANIL ) 75 MG capsule,  clorazepate  (TRANXENE ) 7.5 MG tablet  Social anxiety disorder - Plan: ARIPiprazole  (ABILIFY ) 15 MG tablet, clomiPRAMINE  (ANAFRANIL ) 75 MG capsule, clorazepate  (TRANXENE ) 7.5 MG tablet, traZODone  (DESYREL ) 100 MG tablet  Panic attacks - Plan: ARIPiprazole  (ABILIFY ) 15 MG tablet, clomiPRAMINE  (ANAFRANIL ) 75 MG capsule, clorazepate  (TRANXENE ) 7.5 MG tablet  I review blood work results, notes from other provider.  His testosterone  level is low.  His endocrinologist does not want to increase the testosterone  dose because of hemoglobin.  He likes the new medication for his panic attack but wondering if he can take higher dose of his antidepressant.  Currently he is taking Abilify  5 mg and Anafranil  75 mg.  He also takes trazodone .  I discussed that he is taking full dose of Anafranil  and higher the dose may cause side effects than therapeutic effect.  We can try increasing Abilify  7.5 mg but reminded that it can cause weight gain and high blood sugar.  Patient promised that he will watch his calorie intake and if noticed weight gain that he will call us  back.  Continue clorazepate  7.5 mg twice a day and trazodone  100 mg at bedtime.  We will in 3 months in person as patient cannot do video.  Patient agreed with the plan.   Follow Up Instructions:     I discussed the assessment and treatment plan with the patient. The patient was provided an opportunity to ask questions and all were answered. The patient agreed with the plan and demonstrated an understanding of the instructions.   The patient was advised to call back or seek an in-person evaluation if the symptoms worsen or if the condition fails to improve as anticipated.    Collaboration of Care: Other provider involved in patient's care AEB notes are available in epic to review  Patient/Guardian was advised Release of Information must be obtained prior to any record release in order to collaborate their care with an outside provider. Patient/Guardian  was advised if they have not already done so to contact the registration department to sign all necessary forms in order for us  to release information regarding their care.   Consent: Patient/Guardian gives verbal consent for treatment and assignment of benefits for services provided during this visit. Patient/Guardian expressed understanding and agreed to proceed.     I provided 28 minutes of non face to face time during this encounter.  Note: This document was prepared by [employer] voice dictation technology and any errors that results from this process are unintentional.    Arturo Late, MD 11/10/2023

## 2023-11-12 ENCOUNTER — Ambulatory Visit
Admission: RE | Admit: 2023-11-12 | Discharge: 2023-11-12 | Disposition: A | Discharge status: Home / Self Care | Payer: 59 | Source: Ambulatory Visit | Attending: Pulmonary Disease | Admitting: Pulmonary Disease

## 2023-11-12 ENCOUNTER — Encounter (HOSPITAL_COMMUNITY): Payer: Self-pay

## 2023-11-12 DIAGNOSIS — J672 Bird fancier's lung: Secondary | ICD-10-CM

## 2023-11-12 DIAGNOSIS — R0609 Other forms of dyspnea: Secondary | ICD-10-CM

## 2023-11-13 ENCOUNTER — Other Ambulatory Visit (HOSPITAL_COMMUNITY): Payer: Self-pay | Admitting: *Deleted

## 2023-11-13 ENCOUNTER — Telehealth (HOSPITAL_COMMUNITY): Payer: Self-pay | Admitting: *Deleted

## 2023-11-13 MED ORDER — BREXPIPRAZOLE 1 MG PO TABS: 1.0000 mg | ORAL_TABLET | Freq: Every day | ORAL | 0 refills | Status: DC

## 2023-11-13 NOTE — Progress Notes (Signed)
Writer spoke with pt to advise provider plan to start Rexulti 1 mg every day and d/c the Abilify 15 mg in regards to c/o weight gain. Med ed reviewed. Pt agrees to try the Rexulti. Pt advised also to stay on the Anafranil 75 mg at bedtime. Pt verbalizes understanding. Pt now has an appointment on 12/11/23.

## 2023-11-13 NOTE — Telephone Encounter (Signed)
He can try Rexulti 1 mg and discontinue Abilify.  He need to keep the Anafranil and maybe need a sooner appointment to discuss more options.  If agree please call Rexulti 1 mg to his pharmacy.

## 2023-11-13 NOTE — Telephone Encounter (Signed)
Pt called to f/u on MyChart message regarding discontinuing the Abilify and Anafranil due to 20-25 lb weight gain "in the last month". Pt would like to try another medication without weight gain s/e. Next visit is scheduled for 02/12/24. Please review.

## 2023-11-20 ENCOUNTER — Encounter: Payer: Self-pay | Admitting: Pulmonary Disease

## 2023-11-25 ENCOUNTER — Other Ambulatory Visit: Payer: Self-pay | Admitting: "Endocrinology

## 2023-12-01 ENCOUNTER — Encounter: Payer: Self-pay | Admitting: *Deleted

## 2023-12-02 ENCOUNTER — Encounter: Payer: Self-pay | Admitting: Pulmonary Disease

## 2023-12-11 ENCOUNTER — Ambulatory Visit (HOSPITAL_COMMUNITY): Payer: Self-pay | Admitting: Psychiatry

## 2023-12-11 ENCOUNTER — Other Ambulatory Visit: Payer: Self-pay

## 2023-12-11 ENCOUNTER — Encounter (HOSPITAL_COMMUNITY): Payer: Self-pay | Admitting: Psychiatry

## 2023-12-11 VITALS — BP 121/83 | HR 68 | Ht 74.0 in | Wt 282.0 lb

## 2023-12-11 DIAGNOSIS — F331 Major depressive disorder, recurrent, moderate: Secondary | ICD-10-CM

## 2023-12-11 DIAGNOSIS — F410 Panic disorder [episodic paroxysmal anxiety] without agoraphobia: Secondary | ICD-10-CM

## 2023-12-11 DIAGNOSIS — F4010 Social phobia, unspecified: Secondary | ICD-10-CM | POA: Diagnosis not present

## 2023-12-11 MED ORDER — BREXPIPRAZOLE 1 MG PO TABS: 1.0000 mg | ORAL_TABLET | Freq: Every day | ORAL | 1 refills | Status: DC

## 2023-12-11 MED ORDER — TRAZODONE HCL 100 MG PO TABS
100.0000 mg | ORAL_TABLET | Freq: Every evening | ORAL | 1 refills | Status: DC | PRN
Start: 2023-12-11 — End: 2024-01-22

## 2023-12-11 MED ORDER — CLOMIPRAMINE HCL 50 MG PO CAPS: 100.0000 mg | ORAL_CAPSULE | Freq: Every day | ORAL | 1 refills | Status: DC

## 2023-12-11 MED ORDER — CLORAZEPATE DIPOTASSIUM 7.5 MG PO TABS: 7.5000 mg | ORAL_TABLET | Freq: Three times a day (TID) | ORAL | 1 refills | Status: DC

## 2023-12-11 NOTE — Progress Notes (Signed)
 [facility] MD/PA/NP OP Progress Note  Patient location; office Provider location; office   12/11/2023 2:32 PM Paul Parrish  MRN:  [id number]  Chief Complaint:  Chief Complaint  Patient presents with   Depression   Anxiety   Follow-up   Medication Refill   HPI: Patient came today for his follow appointment.  I have not seen him in office in a while and usually we have virtual visits.  Patient reported symptoms are chronic and he does not feel it is getting better.  We tried Abilify which helped somewhat but he gained excessive weight.  Now he is taking Rexulti 1 mg.  He feels his weight is not increased since then but also he is not able to lose weight.  He does not exercise or walk and stays most of the time at home.  He gets very nervous and anxious around people.  He is taking trazodone and he is using CPAP just helping his sleep.  He is also on Anafranil 75 mg.  He is also on Tranxene 7.5 mg twice a day.  Patient told that he feels very anxious, nervous, sad, depressed but denies any suicidal thoughts or homicidal thoughts.  His diabetes is under control and his last hemoglobin A1c 5.4.  He had labs and his PCP told labs are okay.  Patient denies any chest pain or panic attack.  He is seeing cardiologist and taking medication for his cardiac issues.  He denies any suicidal thoughts or homicidal thoughts.  He denies drinking or using any illegal substances.  He had a visit to the emergency room in January for anxiety and chest pain but since then he has no more visits to the emergency room.  He is not interested in therapy.    Visit Diagnosis:    ICD-10-CM   1. Social anxiety disorder  F40.10 clorazepate (TRANXENE) 7.5 MG tablet    brexpiprazole (REXULTI) 1 MG TABS tablet    clomiPRAMINE (ANAFRANIL) 50 MG capsule    traZODone (DESYREL) 100 MG tablet    2. Moderate episode of recurrent major depressive disorder (HCC)  F33.1 clorazepate (TRANXENE) 7.5 MG tablet    brexpiprazole (REXULTI) 1 MG TABS  tablet    clomiPRAMINE (ANAFRANIL) 50 MG capsule    3. Panic attacks  F41.0 clorazepate (TRANXENE) 7.5 MG tablet    brexpiprazole (REXULTI) 1 MG TABS tablet    clomiPRAMINE (ANAFRANIL) 50 MG capsule      Past Psychiatric History: Reviewed H/O anxiety and depression. Saw provider at neuropsychiatry in [place] but not happy with the treatment.  Saw Dr. Rene Kocher in 2018 and given Prozac, Xanax, Effexor, Celexa, propanolol, Lamictal, Vistaril, Adderall, Wellbutrin,  Cymbalta (increased irritability ), Anafranil, Pristiq, Trintellix and Luvox (did not help). H/O of ECT with no response.  Had genetic testing in 2019.  Paxil, Lexapro, Celexa and Tegretol had significant drug interaction.  No h/o suicidal attempt or inpatient. At [facility] tried [place], Klonopin but did not work. H/O heavy drinking but claims to be sober.     Past Medical History:  Past Medical History:  Diagnosis Date   Anxiety    on meds   Depression    on meds   Elevated hemoglobin (HCC)    Low testosterone    Sleep apnea    uses CPAP   Small bowel obstruction (HCC) 10/21/2016    Past Surgical History:  Procedure Laterality Date   COLONOSCOPY  08/17/2021   LUMBAR SPINE SURGERY  12/2020   NASAL SINUS SURGERY  02/2016   WISDOM TOOTH EXTRACTION      Family Psychiatric History: Reviewed  Family History:  Family History  Problem Relation Age of Onset   Kidney cancer Mother        mets from LEFT hip   Liver cancer Mother        mets from LEFT hip   Diverticulitis Mother    Hypertension Father    Diabetes Father    Diabetes Brother    Cancer Paternal Grandmother     Social History:  Social History   Socioeconomic History   Marital status: Married    Spouse name: Not on file   Number of children: Not on file   Years of education: Not on file   Highest education level: Not on file  Occupational History   Not on file  Tobacco Use   Smoking status: Never   Smokeless tobacco: Never  Vaping Use    Vaping status: Never Used  Substance and Sexual Activity   Alcohol use: Not Currently    Comment: 10/22/2016 "nothing in over 1 year"   Drug use: No   Sexual activity: Yes    Partners: Female    Birth control/protection: None  Other Topics Concern   Not on file  Social History Narrative   Not on file   Social Drivers of Health   Financial Resource Strain: Not on file  Food Insecurity: Not on file  Transportation Needs: Not on file  Physical Activity: Not on file  Stress: Not on file  Social Connections: Not on file    Allergies:  Allergies  Allergen Reactions   Amoxicillin Other (See Comments)    High fever and possible heart racing   Bactrim [Sulfamethoxazole-Trimethoprim] Other (See Comments)    Fever, body aches, chills   Penicillins Other (See Comments)    High fever Has patient had a PCN reaction causing immediate rash, facial/tongue/throat swelling, SOB or lightheadedness with hypotension: No Has patient had a PCN reaction causing severe rash involving mucus membranes or skin necrosis: No Has patient had a PCN reaction that required hospitalization No Has patient had a PCN reaction occurring within the last 10 years: No If all of the above answers are "NO", then may proceed with Cephalosporin use.     Metabolic Disorder Labs: Lab Results  Component Value Date   HGBA1C 5.4 08/15/2023   Lab Results  Component Value Date   PROLACTIN 12.4 10/09/2017   PROLACTIN 14.5 09/08/2015   Lab Results  Component Value Date   CHOL 233 (H) 08/15/2023   TRIG 341 (H) 08/15/2023   HDL 29 (L) 08/15/2023   CHOLHDL 8.0 (H) 08/15/2023   LDLCALC 141 (H) 08/15/2023   LDLCALC 58 02/19/2023   Lab Results  Component Value Date   TSH 2.143 10/13/2023   TSH 3.890 08/16/2022    Therapeutic Level Labs: No results found for: "LITHIUM" No results found for: "VALPROATE" No results found for: "CBMZ"  Current Medications: Current Outpatient Medications  Medication Sig Dispense  Refill   albuterol (VENTOLIN HFA) 108 (90 Base) MCG/ACT inhaler Inhale 2 puffs into the lungs every 6 (six) hours as needed for wheezing or shortness of breath. 8 g 6   brexpiprazole (REXULTI) 1 MG TABS tablet Take 1 tablet (1 mg total) by mouth daily. 30 tablet 0   budesonide-formoterol (SYMBICORT) 160-4.5 MCG/ACT inhaler Inhale 2 puffs into the lungs 2 (two) times daily. 1 each 12   clomiPRAMINE (ANAFRANIL) 75 MG capsule Take 1 capsule (75 mg  total) by mouth at bedtime. 30 capsule 2   clorazepate (TRANXENE) 7.5 MG tablet Take 1 tablet (7.5 mg total) by mouth 2 (two) times daily. 60 tablet 2   fenofibrate (TRICOR) 48 MG tablet Take 1 tablet (48 mg total) by mouth daily. 90 tablet 3   Melatonin 10 MG TABS Take 1 tablet by mouth at bedtime.     metoprolol tartrate (LOPRESSOR) 50 MG tablet Take 1 tablet (50 mg total) by mouth 2 (two) times daily. 180 tablet 3   Multiple Vitamin (MULTI-VITAMIN DAILY PO) Take 1 capsule by mouth 2 (two) times daily. Name: Life Extensions     Omega-3 Fatty Acids (FISH OIL) 1000 MG CAPS Take 1 capsule by mouth 2 (two) times daily.     propranolol (INDERAL) 20 MG tablet Take 1 tablet (20 mg total) by mouth 4 (four) times daily. Only as needed for tachycardia/palpitations 120 tablet 3   Risankizumab-rzaa (SKYRIZI PEN) 150 MG/ML SOAJ Inject 150 mg into the skin as directed. Every 12 weeks for maintenance. 1 mL 6   rosuvastatin (CRESTOR) 10 MG tablet Take 1 tablet (10 mg total) by mouth daily. 90 tablet 3   sildenafil (REVATIO) 20 MG tablet TAKE 1 TABLET BY MOUTH ONCE DAILY AS NEEDED 60 tablet 1   SKYRIZI 150 MG/ML SOSY INJECT 150MG  SUBCUTANEOUSLY EVERY 12 WEEKS AS DIRECTED. 300 mL 10   SYRINGE-NEEDLE, DISP, 3 ML 21G X 1-1/2" 3 ML MISC Use to inject testosterone every week 50 each 1   testosterone cypionate (DEPOTESTOSTERONE CYPIONATE) 200 MG/ML injection INJECT 0.25 ML (50 MG) INTRAMUSCULARLY EVERY 10 DAYS 5 mL 0   traZODone (DESYREL) 100 MG tablet Take 1 tablet (100 mg  total) by mouth at bedtime as needed for sleep. 30 tablet 2   No current facility-administered medications for this visit.     Musculoskeletal: Strength & Muscle Tone: within normal limits Gait & Station: normal Patient leans: N/A  Psychiatric Specialty Exam: Review of Systems  Blood pressure 121/83, pulse 68, height 6\' 2"  (1.88 m), weight 282 lb (127.9 kg).There is no height or weight on file to calculate BMI.  General Appearance: Casual and overweight  Eye Contact:  Fair  Speech:  Slow  Volume:  Decreased  Mood:  Anxious, Depressed, and Dysphoric  Affect:  Constricted and Depressed  Thought Process:  Goal Directed  Orientation:  Full (Time, Place, and Person)  Thought Content: Rumination   Suicidal Thoughts:  No  Homicidal Thoughts:  No  Memory:  Immediate;   Good Recent;   Good Remote;   Fair  Judgement:  Fair  Insight:  Present  Psychomotor Activity:  Decreased  Concentration:  Concentration: Fair and Attention Span: Fair  Recall:  [employer] of Knowledge: Good  Language: Good  Akathisia:  No  Handed:  Right  AIMS (if indicated): not done  Assets:  Communication Skills Desire for Improvement Housing  ADL's:  Intact  Cognition: WNL  Sleep:   Okay with trazodone and CPAP   Screenings: GAD-7    Flowsheet Row Office Visit from 12[facility]11/2023 in [facility] Office Visit from 08/21/2023 in [facility] Office Visit from 02/20/2023 in [facility] Office Visit from 08/15/2022 in [facility] Office Visit from 02/11/2022 in [facility]  Total GAD-7 Score 15 12 11 8 3       PHQ2-9    Flowsheet Row Office Visit from 12/11/2023 in  [facility] Office Visit from 08[facility]21/2023 in [facility] Office Visit from 02/20/2023 in [facility] Office Visit from 08/15/2022 in [facility] Office Visit from 02/11/2022 in [facility]  PHQ-2 Total Score 2 4 2 4 2   PHQ-9 Total Score 10 12 11 12 6       Flowsheet Row Office Visit from 12/11/2023 in [facility] ED from 10/13/2023 in [facility] at [facility] Video Visit from 04/04/2021 in [facility]  C-SSRS RISK CATEGORY Error: Q3, 4, or 5 should not be populated when Q2 is No No Risk No Risk        Assessment and Plan: Patient is 53 year old man with history of hypertension, hypergonadism, tachycardia, prediabetes came for his appointment.  I reviewed his medication.  He continued to struggle with social anxiety, depression.  I review his previous medication.  He had tried numerous medication but did not feel good or have any side effects.  Abilify helped to some extent but he gained excessive weight.  He is now on Rexulti 1 mg.  I recommend to try increasing Tranxene 7.5 mg up to 3 times a day and Anafranil 100 mg daily.  We will keep the Rexulti 1 mg daily and trazodone 100 mg at bedtime.  In the past he had a ECT but has not seen significant improvement.  Recommend to consider TMS and patient is willing to try.  We will refer for TMS and follow-up in 6 weeks.  Discussed safety concerns at any time having active suicidal thoughts or homicidal thoughts and he need to call 911 or go to local emergency room.  Collaboration of Care: Collaboration of Care: Other provider involved in patient's care AEB notes are available in epic to review  Patient/Guardian was advised Release of Information must be obtained prior to any record release in order to collaborate their care with an outside provider. Patient/Guardian was advised if they have not already done so to contact the registration department  to sign all necessary forms in order for [place] to release information regarding their care.   Consent: Patient/Guardian gives verbal consent for treatment and assignment of benefits for services provided during this visit. Patient/Guardian expressed understanding and agreed to proceed.   I provided face-to-face time during this encounter.  Cleotis Nipper, MD 12/11/2023, 2:32 PM

## 2023-12-23 ENCOUNTER — Telehealth (HOSPITAL_COMMUNITY): Payer: Self-pay

## 2023-12-23 NOTE — Telephone Encounter (Signed)
 TMS C: Placed outgoing call to pt to conduct TMS phone screening. Call was forwarded to vm. Coordinator left message requesting call back for screening if still interested in treatment.

## 2023-12-29 ENCOUNTER — Telehealth (HOSPITAL_COMMUNITY): Payer: Self-pay

## 2023-12-29 NOTE — Telephone Encounter (Signed)
 TMS C: Placed outgoing call to pt again to conduct TMS phone screening. Coordinator informed pt via vm that they will be out of office on Thursday and Friday of this week (4/3-4/4) but happy to get them screened before then. Coordinator requested call back at direct office line [phone]). Will await pt response.

## 2024-01-05 ENCOUNTER — Ambulatory Visit: Payer: 59 | Admitting: Cardiovascular Disease

## 2024-01-07 ENCOUNTER — Other Ambulatory Visit: Payer: Self-pay | Admitting: Family Medicine

## 2024-01-22 ENCOUNTER — Ambulatory Visit (HOSPITAL_COMMUNITY): Admitting: Psychiatry

## 2024-01-22 ENCOUNTER — Other Ambulatory Visit: Payer: Self-pay

## 2024-01-22 ENCOUNTER — Encounter (HOSPITAL_COMMUNITY): Payer: Self-pay | Admitting: Psychiatry

## 2024-01-22 VITALS — BP 121/73 | HR 67 | Ht 74.0 in | Wt 281.0 lb

## 2024-01-22 DIAGNOSIS — F4010 Social phobia, unspecified: Secondary | ICD-10-CM | POA: Diagnosis not present

## 2024-01-22 DIAGNOSIS — F331 Major depressive disorder, recurrent, moderate: Secondary | ICD-10-CM

## 2024-01-22 DIAGNOSIS — F410 Panic disorder [episodic paroxysmal anxiety] without agoraphobia: Secondary | ICD-10-CM

## 2024-01-22 MED ORDER — CLOMIPRAMINE HCL 50 MG PO CAPS: 100.0000 mg | ORAL_CAPSULE | Freq: Every day | ORAL | 1 refills | Status: DC

## 2024-01-22 MED ORDER — TRAZODONE HCL 100 MG PO TABS: 100.0000 mg | ORAL_TABLET | Freq: Every evening | ORAL | 1 refills | Status: DC | PRN

## 2024-01-22 MED ORDER — LORAZEPAM 0.5 MG PO TABS: 0.5000 mg | ORAL_TABLET | Freq: Three times a day (TID) | ORAL | 1 refills | Status: DC | PRN

## 2024-01-22 MED ORDER — BREXPIPRAZOLE 2 MG PO TABS: 2.0000 mg | ORAL_TABLET | Freq: Every day | ORAL | 1 refills | Status: DC

## 2024-01-22 NOTE — Progress Notes (Signed)
 [facility] MD/PA/NP OP Progress Note  Patient location; office Provider location; office   01/22/2024 1:40 PM Paul Parrish  MRN:  [id number]  Chief Complaint:  Chief Complaint  Patient presents with   Anxiety   Depression   Follow-up   Obesity   HPI: Patient came today for her follow-up appointment.  On the last visit we increase Anafranil  and also increase Tranxene .  I also recommend to consider TMS.  He is taking Rexulti  1 mg since Abilify  caused weight gain.  Patient told continue to feel the chronic depression and anxiety and does not leave the house unless it is important.  He feel more comfortable in the house.  He works from home for [employer].  Patient told is been working for past 8 years and there has no issue but he does not feel comfortable leaving home.  He admitted having some time arguments with his wife who was not happy because he does not go outside.  He is sleeping okay with the help of CPAP.  He noticed not a huge improvement with increase Tranxene .  He like to go back on Ativan  which he took in the past but it was changed to Tranxene  after having chest pain/panic attack and needed to see in the emergency room.  He has no more episodes.  He is wondering if he can go back to Ativan .  He admitted in the beginning increase Anafranil  cause sedation but now he is taking split dose and 50 in the morning and 50 at bedtime.  He is tolerating well.  He admitted did not contact coordinator for TMS because he was nervous and anxious.  He denies any crying spells or any suicidal thoughts.  He denies any hallucination, paranoia, agitation.  He admitted feel very nervous and anxious around people and does not like crowded places.  He is happy that his weight is stable and he has no tremors shakes or any EPS.  Visit Diagnosis:    ICD-10-CM   1. Moderate episode of recurrent major depressive disorder (HCC)  F33.1 brexpiprazole  (REXULTI ) 2 MG TABS tablet    clomiPRAMINE  (ANAFRANIL )  50 MG capsule    LORazepam  (ATIVAN ) 0.5 MG tablet    2. Social anxiety disorder  F40.10 brexpiprazole  (REXULTI ) 2 MG TABS tablet    clomiPRAMINE  (ANAFRANIL ) 50 MG capsule    traZODone  (DESYREL ) 100 MG tablet    LORazepam  (ATIVAN ) 0.5 MG tablet    3. Panic attacks  F41.0 brexpiprazole  (REXULTI ) 2 MG TABS tablet    clomiPRAMINE  (ANAFRANIL ) 50 MG capsule    LORazepam  (ATIVAN ) 0.5 MG tablet       Past Psychiatric History: Reviewed H/O anxiety and depression. Saw provider at neuropsychiatry in [place] but not happy with the treatment.  Saw Dr. Milton Alpers in 2018 and given Prozac , Xanax , Effexor , Celexa , propanolol, Lamictal , Vistaril , Adderall, Wellbutrin ,  Cymbalta  (increased irritability ), Anafranil , Pristiq , Trintellix  and Luvox  (did not help). H/O of ECT with no response.  Had genetic testing in 2019.  Paxil, Lexapro, Celexa  and Tegretol had significant drug interaction.  No h/o suicidal attempt or inpatient. At [facility] tried Viibryd , Klonopin  but did not work. H/O heavy drinking but claims to be sober.     Past Medical History:  Past Medical History:  Diagnosis Date   Anxiety    on meds   Depression    on meds   Elevated hemoglobin (HCC)    Low testosterone     Sleep apnea    uses CPAP  Small bowel obstruction (HCC) 10/21/2016    Past Surgical History:  Procedure Laterality Date   COLONOSCOPY  08/17/2021   LUMBAR SPINE SURGERY  12/2020   NASAL SINUS SURGERY  02/2016   WISDOM TOOTH EXTRACTION      Family Psychiatric History: Reviewed  Family History:  Family History  Problem Relation Age of Onset   Kidney cancer Mother        mets from LEFT hip   Liver cancer Mother        mets from LEFT hip   Diverticulitis Mother    Hypertension Father    Diabetes Father    Diabetes Brother    Cancer Paternal Grandmother     Social History:  Social History   Socioeconomic History   Marital status: Married    Spouse name: Not on file   Number of children: Not on  file   Years of education: Not on file   Highest education level: Not on file  Occupational History   Not on file  Tobacco Use   Smoking status: Never   Smokeless tobacco: Never  Vaping Use   Vaping status: Never Used  Substance and Sexual Activity   Alcohol use: Not Currently    Comment: 10/22/2016 "nothing in over 1 year"   Drug use: No   Sexual activity: Yes    Partners: Female    Birth control/protection: None  Other Topics Concern   Not on file  Social History Narrative   Not on file   Social Drivers of Health   Financial Resource Strain: Not on file  Food Insecurity: Not on file  Transportation Needs: Not on file  Physical Activity: Not on file  Stress: Not on file  Social Connections: Not on file    Allergies:  Allergies  Allergen Reactions   Amoxicillin Other (See Comments)    High fever and possible heart racing   Bactrim [Sulfamethoxazole-Trimethoprim] Other (See Comments)    Fever, body aches, chills   Penicillins Other (See Comments)    High fever Has patient had a PCN reaction causing immediate rash, facial/tongue/throat swelling, SOB or lightheadedness with hypotension: No Has patient had a PCN reaction causing severe rash involving mucus membranes or skin necrosis: No Has patient had a PCN reaction that required hospitalization No Has patient had a PCN reaction occurring within the last 10 years: No If all of the above answers are "NO", then may proceed with Cephalosporin use.     Metabolic Disorder Labs: Lab Results  Component Value Date   HGBA1C 5.4 08/15/2023   Lab Results  Component Value Date   PROLACTIN 12.4 10/09/2017   PROLACTIN 14.5 09/08/2015   Lab Results  Component Value Date   CHOL 233 (H) 08/15/2023   TRIG 341 (H) 08/15/2023   HDL 29 (L) 08/15/2023   CHOLHDL 8.0 (H) 08/15/2023   LDLCALC 141 (H) 08/15/2023   LDLCALC 58 02/19/2023   Lab Results  Component Value Date   TSH 2.143 10/13/2023   TSH 3.890 08/16/2022     Therapeutic Level Labs: No results found for: "LITHIUM" No results found for: "VALPROATE" No results found for: "CBMZ"  Current Medications: Current Outpatient Medications  Medication Sig Dispense Refill   albuterol  (VENTOLIN  HFA) 108 (90 Base) MCG/ACT inhaler Inhale 2 puffs into the lungs every 6 (six) hours as needed for wheezing or shortness of breath. 8 g 6   brexpiprazole  (REXULTI ) 1 MG TABS tablet Take 1 tablet (1 mg total) by mouth daily. 30 tablet  1   budesonide -formoterol  (SYMBICORT ) 160-4.5 MCG/ACT inhaler Inhale 2 puffs into the lungs 2 (two) times daily. 1 each 12   clomiPRAMINE  (ANAFRANIL ) 50 MG capsule Take 2 capsules (100 mg total) by mouth at bedtime. 60 capsule 1   clorazepate  (TRANXENE ) 7.5 MG tablet Take 1 tablet (7.5 mg total) by mouth 3 (three) times daily. 90 tablet 1   fenofibrate  (TRICOR ) 48 MG tablet Take 1 tablet (48 mg total) by mouth daily. 90 tablet 3   Melatonin 10 MG TABS Take 1 tablet by mouth at bedtime.     metoprolol  tartrate (LOPRESSOR ) 50 MG tablet Take 1 tablet (50 mg total) by mouth 2 (two) times daily. 180 tablet 3   Multiple Vitamin (MULTI-VITAMIN DAILY PO) Take 1 capsule by mouth 2 (two) times daily. Name: Life Extensions     Omega-3 Fatty Acids (FISH OIL) 1000 MG CAPS Take 1 capsule by mouth 2 (two) times daily.     propranolol  (INDERAL ) 20 MG tablet Take 1 tablet (20 mg total) by mouth 4 (four) times daily. Only as needed for tachycardia/palpitations 120 tablet 3   Risankizumab -rzaa (SKYRIZI  PEN) 150 MG/ML SOAJ Inject 150 mg into the skin as directed. Every 12 weeks for maintenance. 1 mL 6   rosuvastatin  (CRESTOR ) 10 MG tablet Take 1 tablet (10 mg total) by mouth daily. 90 tablet 3   sildenafil  (REVATIO ) 20 MG tablet TAKE 1 TABLET BY MOUTH ONCE DAILY AS NEEDED 60 tablet 1   SKYRIZI  150 MG/ML SOSY INJECT 150MG  SUBCUTANEOUSLY EVERY 12 WEEKS AS DIRECTED. 300 mL 10   SYRINGE-NEEDLE, DISP, 3 ML 21G X 1-1/2" 3 ML MISC Use to inject testosterone  every  week 50 each 1   testosterone  cypionate (DEPOTESTOSTERONE CYPIONATE) 200 MG/ML injection INJECT 0.25 ML (50 MG) INTRAMUSCULARLY EVERY 10 DAYS 5 mL 0   traZODone  (DESYREL ) 100 MG tablet Take 1 tablet (100 mg total) by mouth at bedtime as needed for sleep. 30 tablet 1   No current facility-administered medications for this visit.     Musculoskeletal: Strength & Muscle Tone: within normal limits Gait & Station: normal Patient leans: N/A  Psychiatric Specialty Exam: Review of Systems  Blood pressure 121/73, pulse 67, height 6\' 2"  (1.88 m), weight 281 lb (127.5 kg).There is no height or weight on file to calculate BMI.  General Appearance: Casual  Eye Contact:  Fair  Speech:  Slow  Volume:  Decreased  Mood:  Anxious and Dysphoric  Affect:  Constricted and Restricted  Thought Process:  Goal Directed  Orientation:  Full (Time, Place, and Person)  Thought Content: Rumination   Suicidal Thoughts:  No  Homicidal Thoughts:  No  Memory:  Immediate;   Good Recent;   Good Remote;   Fair  Judgement:  Fair  Insight:  Present  Psychomotor Activity:  Decreased  Concentration:  Concentration: Fair and Attention Span: Fair  Recall:  [employer] of Knowledge: Good  Language: Good  Akathisia:  No  Handed:  Right  AIMS (if indicated): not done  Assets:  Communication Skills Desire for Improvement Housing  ADL's:  Intact  Cognition: WNL  Sleep:   Okay with trazodone  and CPAP   Screenings: GAD-7    Flowsheet Row Office Visit from [facility]11[facility]20[facility] in [facility] Office Visit from 08/21/2023 in [facility] Office Visit from 02/20/2023 in [facility] Office Visit from 08/15/2022 in [facility] Office Visit from 02/11/2022 in [facility]  Total GAD-7 Score 15 12 11 8 3       PHQ2-9    Flowsheet Row Office Visit from  12/11/2023 in [facility] Office Visit from 08/21/2023 in [facility] Office Visit from 02/20/2023 in [facility] Office Visit from 08/15/2022 in [facility] Office Visit from 02/11/2022 in [facility]  PHQ-2 Total Score 2 4 2 4 2   PHQ-9 Total Score 10 12 11 12 6       Flowsheet Row Office Visit from 12/11/2023 in [facility] ED from 10/13/2023 in [facility] Emergency Department at [facility] Video Visit from 04/04/2021 in [facility]  C-SSRS RISK CATEGORY Error: Q3, 4, or 5 should not be populated when Q2 is No No Risk No Risk        Assessment and Plan: Patient is 53 year old man with history of hypertension, hypergonadism, tachycardia, prediabetic came for his follow-up appointment.  Patient still have residual anxiety and dysphoria.  Increase Anafranil  and Tranxene  did not help as much.  Now he like to go back to Ativan .  I emphasized that he had tried multiple medication and should consider TMS.  Patient is nervous and anxious.  He lives in [place] and it is a 45-minute drive and not sure if he can do 5 days a week TMS treatment.  I discussed other possibility is to Spravato and patient is interested to look into it.  I have provided places where he can get treatment.  I also provided literature about Spravato treatment.  Recommend to try Rexulti  2 mg since he is tolerating well and has not seen significant improvement with 1 mg.  Discontinue Tranxene  as patient do not feel it help and like to go back on Ativan .  Will restart Ativan  0.5 mg 3 times a day, continue Anafranil  50 mg twice a day, trazodone  100 mg at bedtime and he will try Rexulti  2 mg daily.  I discussed since he had tried a lot of medication with limited response, he should  consider either TMS or Spravato treatment.  Patient agreed with the plan.  Follow-up in 2 months unless needed in a sooner appointment.  Collaboration of Care: Collaboration of Care: Other provider involved in patient's care AEB notes are available in epic to review  Patient/Guardian was advised Release of Information must be obtained prior to any record release in order to collaborate their care with an outside provider. Patient/Guardian was advised if they have not already done so to contact the registration department to sign all necessary forms in order for us  to release information regarding their care.   Consent: Patient/Guardian gives verbal consent for treatment and assignment of benefits for services provided during this visit. Patient/Guardian expressed understanding and agreed to proceed.   I provided face-to-face time during this encounter.  Arturo Late, MD 01/22/2024, 1:40 PM

## 2024-01-26 ENCOUNTER — Ambulatory Visit: Payer: 59 | Admitting: Pulmonary Disease

## 2024-02-03 ENCOUNTER — Ambulatory Visit: Payer: 59 | Admitting: "Endocrinology

## 2024-02-04 ENCOUNTER — Encounter: Payer: Self-pay | Admitting: Family Medicine

## 2024-02-06 ENCOUNTER — Other Ambulatory Visit

## 2024-02-06 DIAGNOSIS — E782 Mixed hyperlipidemia: Secondary | ICD-10-CM

## 2024-02-06 DIAGNOSIS — Z125 Encounter for screening for malignant neoplasm of prostate: Secondary | ICD-10-CM

## 2024-02-06 DIAGNOSIS — E781 Pure hyperglyceridemia: Secondary | ICD-10-CM

## 2024-02-06 DIAGNOSIS — R7303 Prediabetes: Secondary | ICD-10-CM

## 2024-02-07 LAB — LIPID PANEL
Chol/HDL Ratio: 4.3 ratio (ref 0.0–5.0)
Cholesterol, Total: 130 mg/dL (ref 100–199)
HDL: 30 mg/dL — ABNORMAL LOW (ref >39)
LDL Chol Calc (NIH): 72 mg/dL (ref 0–99)
Triglycerides: 163 mg/dL — ABNORMAL HIGH (ref 0–149)
VLDL Cholesterol Cal: 28 mg/dL (ref 5–40)

## 2024-02-07 LAB — CBC WITH DIFFERENTIAL/PLATELET
Basophils Absolute: 0.1 10*3/uL (ref 0.0–0.2)
Basos: 1 %
EOS (ABSOLUTE): 0.1 10*3/uL (ref 0.0–0.4)
Eos: 2 %
Hematocrit: 53 % — ABNORMAL HIGH (ref 37.5–51.0)
Hemoglobin: 17.6 g/dL (ref 13.0–17.7)
Immature Grans (Abs): 0 10*3/uL (ref 0.0–0.1)
Immature Granulocytes: 0 %
Lymphocytes Absolute: 2.8 10*3/uL (ref 0.7–3.1)
Lymphs: 41 %
MCH: 30.2 pg (ref 26.6–33.0)
MCHC: 33.2 g/dL (ref 31.5–35.7)
MCV: 91 fL (ref 79–97)
Monocytes Absolute: 0.6 10*3/uL (ref 0.1–0.9)
Monocytes: 9 %
Neutrophils Absolute: 3.2 10*3/uL (ref 1.4–7.0)
Neutrophils: 47 %
Platelets: 263 10*3/uL (ref 150–450)
RBC: 5.83 x10E6/uL — ABNORMAL HIGH (ref 4.14–5.80)
RDW: 12.9 % (ref 11.6–15.4)
WBC: 6.7 10*3/uL (ref 3.4–10.8)

## 2024-02-07 LAB — CMP14+EGFR
ALT: 33 IU/L (ref 0–44)
AST: 24 IU/L (ref 0–40)
Albumin: 4.7 g/dL (ref 3.8–4.9)
Alkaline Phosphatase: 54 IU/L (ref 44–121)
BUN/Creatinine Ratio: 10 (ref 9–20)
BUN: 13 mg/dL (ref 6–24)
Bilirubin Total: 0.5 mg/dL (ref 0.0–1.2)
CO2: 22 mmol/L (ref 20–29)
Calcium: 9.6 mg/dL (ref 8.7–10.2)
Chloride: 100 mmol/L (ref 96–106)
Creatinine, Ser: 1.3 mg/dL — ABNORMAL HIGH (ref 0.76–1.27)
Globulin, Total: 2.6 g/dL (ref 1.5–4.5)
Glucose: 94 mg/dL (ref 70–99)
Potassium: 4.6 mmol/L (ref 3.5–5.2)
Sodium: 138 mmol/L (ref 134–144)
Total Protein: 7.3 g/dL (ref 6.0–8.5)
eGFR: 66 mL/min/{1.73_m2} (ref >59)

## 2024-02-07 LAB — PSA, TOTAL AND FREE
PSA, Free Pct: 33.3 %
PSA, Free: 0.1 ng/mL
Prostate Specific Ag, Serum: 0.3 ng/mL (ref 0.0–4.0)

## 2024-02-08 LAB — CBC WITH DIFFERENTIAL/PLATELET
Basophils Absolute: 0.1 10*3/uL (ref 0.0–0.2)
Basos: 1 %
EOS (ABSOLUTE): 0.1 10*3/uL (ref 0.0–0.4)
Eos: 2 %
Hematocrit: 53.4 % — ABNORMAL HIGH (ref 37.5–51.0)
Hemoglobin: 17.4 g/dL (ref 13.0–17.7)
Immature Grans (Abs): 0 10*3/uL (ref 0.0–0.1)
Immature Granulocytes: 0 %
Lymphocytes Absolute: 2.8 10*3/uL (ref 0.7–3.1)
Lymphs: 40 %
MCH: 29.7 pg (ref 26.6–33.0)
MCHC: 32.6 g/dL (ref 31.5–35.7)
MCV: 91 fL (ref 79–97)
Monocytes Absolute: 0.7 10*3/uL (ref 0.1–0.9)
Monocytes: 10 %
Neutrophils Absolute: 3.2 10*3/uL (ref 1.4–7.0)
Neutrophils: 47 %
Platelets: 258 10*3/uL (ref 150–450)
RBC: 5.85 x10E6/uL — ABNORMAL HIGH (ref 4.14–5.80)
RDW: 12.9 % (ref 11.6–15.4)
WBC: 6.9 10*3/uL (ref 3.4–10.8)

## 2024-02-08 LAB — TESTOSTERONE, FREE, TOTAL, SHBG
Sex Hormone Binding: 25.8 nmol/L (ref 19.3–76.4)
Testosterone, Free: 11.3 pg/mL (ref 7.2–24.0)
Testosterone: 445 ng/dL (ref 264–916)

## 2024-02-08 LAB — PSA: Prostate Specific Ag, Serum: 0.3 ng/mL (ref 0.0–4.0)

## 2024-02-11 ENCOUNTER — Ambulatory Visit: Admitting: "Endocrinology

## 2024-02-11 ENCOUNTER — Encounter: Payer: Self-pay | Admitting: "Endocrinology

## 2024-02-11 ENCOUNTER — Other Ambulatory Visit

## 2024-02-11 VITALS — BP 122/74 | HR 68 | Ht 74.0 in | Wt 277.0 lb

## 2024-02-11 DIAGNOSIS — E291 Testicular hypofunction: Secondary | ICD-10-CM

## 2024-02-11 MED ORDER — TESTOSTERONE CYPIONATE 200 MG/ML IM SOLN: INTRAMUSCULAR | 0 refills | Status: DC

## 2024-02-11 NOTE — Progress Notes (Signed)
 02/11/2024      Endocrinology follow-up note   HPI: Paul Parrish is a 53 y.o.-year-old man.  - He is being seen in follow-up for  hypogonadism.  He was diagnosed with hypogonadism in 2014, etiology unclear.   -Due to polycythemia, he undergoes phlebotomy periodically.  He is currently on testosterone  50 mg every 10 days.  His total testosterone  is on target, however presents with continued mild erythrocytosis.   Patient wishes to continue on testosterone  treatment.  He reports improvement in his symptoms including fatigue and low libido.    -He reports history of seizures as a young man until his teenage years. -He denies any history of head injury.  - He also is recently diagnosed with sleep apnea currently on CPAP.  - He fathers 2 teenage boys biologically. - He has significant mood disorder on multiple antidepressants and antianxiety. No herbal medicines. -He denies family history of premature coronary artery disease.   ROS: Limited as above.  PE: BP 122/74   Pulse 68   Ht 6\' 2"  (1.88 m)   Wt 277 lb (125.6 kg)   BMI 35.56 kg/m  Wt Readings from Last 3 Encounters:  02/11/24 277 lb (125.6 kg)  11/07/23 279 lb 9.6 oz (126.8 kg)  10/20/23 282 lb 6.4 oz (128.1 kg)     Recent Results (from the past 2160 hours)  Testosterone , Free, Total, SHBG     Status: None   Collection Time: 02/06/24  9:11 AM  Result Value Ref Range   Testosterone  445 264 - 916 ng/dL    Comment: Adult male reference interval is based on a population of healthy nonobese males (BMI <30) between 69 and 35 years old. Travison, et.al. JCEM [phone]. PMID: [id number].    Testosterone , Free 11.3 7.2 - 24.0 pg/mL   Sex Hormone Binding 25.8 19.3 - 76.4 nmol/L  CBC with Differential/Platelet     Status: Abnormal   Collection Time: 02/06/24  9:11 AM  Result Value Ref Range   WBC 6.9 3.4 - 10.8 x10E3/uL   RBC 5.85 (H) 4.14 - 5.80 x10E6/uL   Hemoglobin 17.4 13.0 - 17.7 g/dL   Hematocrit 62.1 (H)  37.5 - 51.0 %   MCV 91 79 - 97 fL   MCH 29.7 26.6 - 33.0 pg   MCHC 32.6 31.5 - 35.7 g/dL   RDW 30.8 65.7 - 84.6 %   Platelets 258 150 - 450 x10E3/uL   Neutrophils 47 Not Estab. %   Lymphs 40 Not Estab. %   Monocytes 10 Not Estab. %   Eos 2 Not Estab. %   Basos 1 Not Estab. %   Neutrophils Absolute 3.2 1.4 - 7.0 x10E3/uL   Lymphocytes Absolute 2.8 0.7 - 3.1 x10E3/uL   Monocytes Absolute 0.7 0.1 - 0.9 x10E3/uL   EOS (ABSOLUTE) 0.1 0.0 - 0.4 x10E3/uL   Basophils Absolute 0.1 0.0 - 0.2 x10E3/uL   Immature Granulocytes 0 Not Estab. %   Immature Grans (Abs) 0.0 0.0 - 0.1 x10E3/uL  PSA     Status: None   Collection Time: 02/06/24  9:11 AM  Result Value Ref Range   Prostate Specific Ag, Serum 0.3 0.0 - 4.0 ng/mL    Comment: Roche ECLIA methodology. According to the American Urological Association, Serum PSA should decrease and remain at undetectable levels after radical prostatectomy. The AUA defines biochemical recurrence as an initial PSA value 0.2 ng/mL or greater followed by a subsequent confirmatory PSA value 0.2 ng/mL or greater. Values obtained with different assay  methods or kits cannot be used interchangeably. Results cannot be interpreted as absolute evidence of the presence or absence of malignant disease.   PSA, total and free     Status: None   Collection Time: 02/06/24  9:17 AM  Result Value Ref Range   Prostate Specific Ag, Serum 0.3 0.0 - 4.0 ng/mL    Comment: Roche ECLIA methodology. According to the American Urological Association, Serum PSA should decrease and remain at undetectable levels after radical prostatectomy. The AUA defines biochemical recurrence as an initial PSA value 0.2 ng/mL or greater followed by a subsequent confirmatory PSA value 0.2 ng/mL or greater. Values obtained with different assay methods or kits cannot be used interchangeably. Results cannot be interpreted as absolute evidence of the presence or absence of malignant disease.    PSA,  Free 0.10 N/A ng/mL    Comment: Roche ECLIA methodology.   PSA, Free Pct 33.3 %    Comment: The table below lists the probability of prostate cancer for men with non-suspicious DRE results and total PSA between 4 and 10 ng/mL, by patient age Kalvin Orf, JAMA 1998, 440:1027).                   % Free PSA       50-64 yr        65-75 yr                   0.00-10.00%        56%             55%                  10.01-15.00%        24%             35%                  15.01-20.00%        17%             23%                  20.01-25.00%        10%             20%                       >25.00%         5%              9% Please note:  Catalona et al did not make specific               recommendations regarding the use of               percent free PSA for any other population               of men.   Lipid panel     Status: Abnormal   Collection Time: 02/06/24  9:17 AM  Result Value Ref Range   Cholesterol, Total 130 100 - 199 mg/dL   Triglycerides 253 (H) 0 - 149 mg/dL   HDL 30 (L) >66 mg/dL   VLDL Cholesterol Cal 28 5 - 40 mg/dL   LDL Chol Calc (NIH) 72 0 - 99 mg/dL   Chol/HDL Ratio 4.3 0.0 - 5.0 ratio    Comment:  T. Chol/HDL Ratio                                             Men  Women                               1/2 Avg.Risk  3.4    3.3                                   Avg.Risk  5.0    4.4                                2X Avg.Risk  9.6    7.1                                3X Avg.Risk 23.4   11.0   CMP14+EGFR     Status: Abnormal   Collection Time: 02/06/24  9:17 AM  Result Value Ref Range   Glucose 94 70 - 99 mg/dL   BUN 13 6 - 24 mg/dL   Creatinine, Ser 1.61 (H) 0.76 - 1.27 mg/dL   eGFR 66 >09 UE/AVW/0.98   BUN/Creatinine Ratio 10 9 - 20   Sodium 138 134 - 144 mmol/L   Potassium 4.6 3.5 - 5.2 mmol/L   Chloride 100 96 - 106 mmol/L   CO2 22 20 - 29 mmol/L   Calcium  9.6 8.7 - 10.2 mg/dL   Total Protein 7.3 6.0 - 8.5 g/dL   Albumin  4.7 3.8 - 4.9 g/dL   Globulin, Total 2.6 1.5 - 4.5 g/dL   Bilirubin Total 0.5 0.0 - 1.2 mg/dL   Alkaline Phosphatase 54 44 - 121 IU/L   AST 24 0 - 40 IU/L   ALT 33 0 - 44 IU/L  CBC with Differential/Platelet     Status: Abnormal   Collection Time: 02/06/24  9:17 AM  Result Value Ref Range   WBC 6.7 3.4 - 10.8 x10E3/uL   RBC 5.83 (H) 4.14 - 5.80 x10E6/uL   Hemoglobin 17.6 13.0 - 17.7 g/dL   Hematocrit 11.9 (H) 14.7 - 51.0 %   MCV 91 79 - 97 fL   MCH 30.2 26.6 - 33.0 pg   MCHC 33.2 31.5 - 35.7 g/dL   RDW 82.9 56.2 - 13.0 %   Platelets 263 150 - 450 x10E3/uL   Neutrophils 47 Not Estab. %   Lymphs 41 Not Estab. %   Monocytes 9 Not Estab. %   Eos 2 Not Estab. %   Basos 1 Not Estab. %   Neutrophils Absolute 3.2 1.4 - 7.0 x10E3/uL   Lymphocytes Absolute 2.8 0.7 - 3.1 x10E3/uL   Monocytes Absolute 0.6 0.1 - 0.9 x10E3/uL   EOS (ABSOLUTE) 0.1 0.0 - 0.4 x10E3/uL   Basophils Absolute 0.1 0.0 - 0.2 x10E3/uL   Immature Granulocytes 0 Not Estab. %   Immature Grans (Abs) 0.0 0.0 - 0.1 x10E3/uL      ASSESSMENT: 1.  Hypogonadism  2.  Polycythemia 3.  Prediabetes  PLAN:  1. Hypogonadism  -His last phlebotomy was in January 2025.  He presents with corrected total testosterone  at  445, however still elevated RBC 5.83, and hematocrit of 53.  He is advised to lower his testosterone  to 50 mg IM every 14 days.    His history of  secondary polycythemia which continues to require periodic phlebotomy, as well as sleep apnea requiring CPAP treatment making him a poor candidate for more generous testosterone  replacement. Regarding his weight concern: His insurance did not provide coverage for injectable weight loss medications. He remains a good fit for lifestyle medicine.  - he acknowledges that there is a room for improvement in his food and drink choices. - Suggestion is made for him to avoid simple carbohydrates  from his diet including Cakes, Sweet Desserts, Ice Cream, Soda (diet and regular),  Sweet Tea, Candies, Chips, Cookies, Store Bought Juices, Alcohol , Artificial Sweeteners,  Coffee Creamer, and "Sugar-free" Products, Lemonade. This will help patient to have more stable blood glucose profile and potentially avoid unintended weight gain.  The following Lifestyle Medicine recommendations according to American College of Lifestyle Medicine  [facility]) were discussed and and offered to patient and he  agrees to start the journey:  A. Whole Foods, Plant-Based Nutrition comprising of fruits and vegetables, plant-based proteins, whole-grain carbohydrates was discussed in detail with the patient.   A list for source of those nutrients were also provided to the patient.  Patient will use only water or unsweetened tea for hydration. B.  The need to stay away from risky substances including alcohol, smoking; obtaining 7 to 9 hours of restorative sleep, at least 150 minutes of moderate intensity exercise weekly, the importance of healthy social connections,  and stress management techniques were discussed. C.  A full color page of  Calorie density of various food groups per pound showing examples of each food groups was provided to the patient.   He is advised to continue close follow-up with his PMD for primary care needs.  I spent  22  minutes in the care of the patient today including review of labs from Thyroid  Function, CMP, and other relevant labs ; imaging/biopsy records (current and previous including abstractions from other facilities); face-to-face time discussing  his lab results and symptoms, medications doses, his options of short and long term treatment based on the latest standards of care / guidelines;   and documenting the encounter.  Aletha Hutching  participated in the discussions, expressed understanding, and voiced agreement with the above plans.  All questions were answered to his satisfaction. he is encouraged to contact clinic should he have any questions or concerns prior to his  return visit.    Kalvin Orf, MD Phone: [phone]  Fax: [phone]  -  This note was partially dictated with voice recognition software. Similar sounding words can be transcribed inadequately or may not  be corrected upon review.  02/11/2024, 3:50 PM

## 2024-02-12 ENCOUNTER — Ambulatory Visit (HOSPITAL_COMMUNITY): Payer: Self-pay | Admitting: Psychiatry

## 2024-02-13 ENCOUNTER — Ambulatory Visit: Payer: Self-pay | Admitting: Family Medicine

## 2024-02-18 ENCOUNTER — Ambulatory Visit: Payer: 59 | Admitting: Family Medicine

## 2024-02-19 ENCOUNTER — Ambulatory Visit: Admitting: Family Medicine

## 2024-02-19 ENCOUNTER — Encounter: Payer: Self-pay | Admitting: Family Medicine

## 2024-02-19 VITALS — BP 121/89 | HR 81 | Ht 74.0 in | Wt 273.0 lb

## 2024-02-19 DIAGNOSIS — E782 Mixed hyperlipidemia: Secondary | ICD-10-CM

## 2024-02-19 DIAGNOSIS — E781 Pure hyperglyceridemia: Secondary | ICD-10-CM

## 2024-02-19 DIAGNOSIS — I10 Essential (primary) hypertension: Secondary | ICD-10-CM

## 2024-02-19 DIAGNOSIS — R7303 Prediabetes: Secondary | ICD-10-CM | POA: Diagnosis not present

## 2024-02-19 NOTE — Progress Notes (Signed)
 BP 121/89   Pulse 81   Ht 6\' 2"  (1.88 m)   Wt 273 lb (123.8 kg)   SpO2 97%   BMI 35.05 kg/m    Subjective:   Patient ID: Paul Parrish, male    DOB: [birth year], 53 y.o.   MRN: [id number]  HPI: Paul Parrish is a 53 y.o. male presenting on 02/19/2024 for Medical Management of Chronic Issues, Hypertension, and Hyperlipidemia   HPI Hypertension Patient is currently on propranolol  and metoprolol , and their blood pressure today is 121/89. Patient denies any lightheadedness or dizziness. Patient denies headaches, blurred vision, chest pains, shortness of breath, or weakness. Denies any side effects from medication and is content with current medication.   Hyperlipidemia Patient is coming in for recheck of his hyperlipidemia. The patient is currently taking Crestor  and fenofibrate . They deny any issues with myalgias or history of liver damage from it. They deny any focal numbness or weakness or chest pain.   Prediabetes Patient comes in today for recheck of his diabetes. Patient has been currently taking no medication currently. Patient is not currently on an ACE inhibitor/ARB. Patient has not seen an ophthalmologist this year. Patient denies any new issues with their feet. The symptom started onset as an adult hypertension and ARE RELATED TO DM   Patient says his only real issue is symptom that he is having currently is fatigue.  With his blood work he looks pretty good today but he does say he has a sleep apnea machine that has had for 7 or 8 years and has not had it rechecked or calibrated.  He also does have a doctor that he sees for anxiety and depression and he does state his motivation and energy is part of what is making him feel down.  He is going to discuss it with them further.  He denies any chest pain or palpitations.  He also says they did increase the metoprolol  earlier on in the year but he thinks he was having this issue before the increase in metoprolol   Relevant past medical,  surgical, family and social history reviewed and updated as indicated. Interim medical history since our last visit reviewed. Allergies and medications reviewed and updated.  Review of Systems  Constitutional:  Negative for chills and fever.  Eyes:  Negative for visual disturbance.  Respiratory:  Negative for shortness of breath and wheezing.   Cardiovascular:  Negative for chest pain and leg swelling.  Musculoskeletal:  Negative for back pain and gait problem.  Skin:  Negative for rash.  Neurological:  Negative for dizziness and light-headedness.  All other systems reviewed and are negative.   Per HPI unless specifically indicated above   Allergies as of 02/19/2024       Reactions   Amoxicillin Other (See Comments)   High fever and possible heart racing   Bactrim [sulfamethoxazole-trimethoprim] Other (See Comments)   Fever, body aches, chills   Penicillins Other (See Comments)   High fever Has patient had a PCN reaction causing immediate rash, facial/tongue/throat swelling, SOB or lightheadedness with hypotension: No Has patient had a PCN reaction causing severe rash involving mucus membranes or skin necrosis: No Has patient had a PCN reaction that required hospitalization No Has patient had a PCN reaction occurring within the last 10 years: No If all of the above answers are "NO", then may proceed with Cephalosporin use.        Medication List        Accurate as of Feb 19, 2024  4:26 PM. If you have any questions, ask your nurse or doctor.          STOP taking these medications    albuterol  108 (90 Base) MCG/ACT inhaler Commonly known as: VENTOLIN  HFA Stopped by: Lucio Sabin Brooke Payes   budesonide -formoterol  160-4.5 MCG/ACT inhaler Commonly known as: Symbicort  Stopped by: Lucio Sabin Nikia Levels   clomiPRAMINE  50 MG capsule Commonly known as: ANAFRANIL  Stopped by: Lucio Sabin Tierra Divelbiss       TAKE these medications    clorazepate  7.5 MG tablet Commonly known as:  TRANXENE  Take 1 tablet (7.5 mg total) by mouth 3 (three) times daily.   fenofibrate  48 MG tablet Commonly known as: Tricor  Take 1 tablet (48 mg total) by mouth daily.   Fish Oil 1000 MG Caps Take 1 capsule by mouth 2 (two) times daily.   LORazepam  0.5 MG tablet Commonly known as: Ativan  Take 1 tablet (0.5 mg total) by mouth 3 (three) times daily as needed for anxiety.   Melatonin 10 MG Tabs Take 1 tablet by mouth at bedtime.   metoprolol  tartrate 50 MG tablet Commonly known as: LOPRESSOR  Take 1 tablet (50 mg total) by mouth 2 (two) times daily.   MULTI-VITAMIN DAILY PO Take 1 capsule by mouth 2 (two) times daily. Name: Life Extensions   propranolol  20 MG tablet Commonly known as: INDERAL  Take 1 tablet (20 mg total) by mouth 4 (four) times daily. Only as needed for tachycardia/palpitations   rosuvastatin  10 MG tablet Commonly known as: Crestor  Take 1 tablet (10 mg total) by mouth daily.   sildenafil  20 MG tablet Commonly known as: REVATIO  TAKE 1 TABLET BY MOUTH ONCE DAILY AS NEEDED   Skyrizi  150 MG/ML Sosy prefilled syringe Generic drug: risankizumab -rzaa INJECT 150MG  SUBCUTANEOUSLY EVERY 12 WEEKS AS DIRECTED.   Skyrizi  Pen 150 MG/ML pen Generic drug: risankizumab -rzaa Inject 150 mg into the skin as directed. Every 12 weeks for maintenance.   SYRINGE-NEEDLE (DISP) 3 ML 21G X 1-1/2" 3 ML Misc Use to inject testosterone  every week   testosterone  cypionate 200 MG/ML injection Commonly known as: DEPOTESTOSTERONE CYPIONATE INJECT 0.25 ML (50 MG) INTRAMUSCULARLY EVERY 14 DAYS   traZODone  100 MG tablet Commonly known as: DESYREL  Take 1 tablet (100 mg total) by mouth at bedtime as needed for sleep.         Objective:   BP 121/89   Pulse 81   Ht 6\' 2"  (1.88 m)   Wt 273 lb (123.8 kg)   SpO2 97%   BMI 35.05 kg/m   Wt Readings from Last 3 Encounters:  02/19/24 273 lb (123.8 kg)  02/11/24 277 lb (125.6 kg)  11/07/23 279 lb 9.6 oz (126.8 kg)    Physical  Exam Vitals and nursing note reviewed.  Constitutional:      General: He is not in acute distress.    Appearance: He is well-developed. He is not diaphoretic.  Eyes:     General: No scleral icterus.    Conjunctiva/sclera: Conjunctivae normal.  Neck:     Thyroid : No thyromegaly.  Cardiovascular:     Rate and Rhythm: Normal rate and regular rhythm.     Heart sounds: Normal heart sounds. No murmur heard. Pulmonary:     Effort: Pulmonary effort is normal. No respiratory distress.     Breath sounds: Normal breath sounds. No wheezing.  Abdominal:     General: Abdomen is flat. Bowel sounds are normal. There is no distension.     Palpations: Abdomen is soft.  Tenderness: There is no abdominal tenderness.  Musculoskeletal:        General: Normal range of motion.     Cervical back: Neck supple.  Lymphadenopathy:     Cervical: No cervical adenopathy.  Skin:    General: Skin is warm and dry.     Findings: No rash.  Neurological:     Mental Status: He is alert and oriented to person, place, and time.     Coordination: Coordination normal.  Psychiatric:        Behavior: Behavior normal.     Results for orders placed or performed in visit on 02/06/24  PSA, total and free   Collection Time: 02/06/24  9:17 AM  Result Value Ref Range   Prostate Specific Ag, Serum 0.3 0.0 - 4.0 ng/mL   PSA, Free 0.10 N/A ng/mL   PSA, Free Pct 33.3 %  Lipid panel   Collection Time: 02/06/24  9:17 AM  Result Value Ref Range   Cholesterol, Total 130 100 - 199 mg/dL   Triglycerides 161 (H) 0 - 149 mg/dL   HDL 30 (L) >09 mg/dL   VLDL Cholesterol Cal 28 5 - 40 mg/dL   LDL Chol Calc (NIH) 72 0 - 99 mg/dL   Chol/HDL Ratio 4.3 0.0 - 5.0 ratio  CMP14+EGFR   Collection Time: 02/06/24  9:17 AM  Result Value Ref Range   Glucose 94 70 - 99 mg/dL   BUN 13 6 - 24 mg/dL   Creatinine, Ser 6.04 (H) 0.76 - 1.27 mg/dL   eGFR 66 >54 UJ/WJX/9.14   BUN/Creatinine Ratio 10 9 - 20   Sodium 138 134 - 144 mmol/L    Potassium 4.6 3.5 - 5.2 mmol/L   Chloride 100 96 - 106 mmol/L   CO2 22 20 - 29 mmol/L   Calcium  9.6 8.7 - 10.2 mg/dL   Total Protein 7.3 6.0 - 8.5 g/dL   Albumin 4.7 3.8 - 4.9 g/dL   Globulin, Total 2.6 1.5 - 4.5 g/dL   Bilirubin Total 0.5 0.0 - 1.2 mg/dL   Alkaline Phosphatase 54 44 - 121 IU/L   AST 24 0 - 40 IU/L   ALT 33 0 - 44 IU/L  CBC with Differential/Platelet   Collection Time: 02/06/24  9:17 AM  Result Value Ref Range   WBC 6.7 3.4 - 10.8 x10E3/uL   RBC 5.83 (H) 4.14 - 5.80 x10E6/uL   Hemoglobin 17.6 13.0 - 17.7 g/dL   Hematocrit 78.2 (H) 95.6 - 51.0 %   MCV 91 79 - 97 fL   MCH 30.2 26.6 - 33.0 pg   MCHC 33.2 31.5 - 35.7 g/dL   RDW 21.3 08.6 - 57.8 %   Platelets 263 150 - 450 x10E3/uL   Neutrophils 47 Not Estab. %   Lymphs 41 Not Estab. %   Monocytes 9 Not Estab. %   Eos 2 Not Estab. %   Basos 1 Not Estab. %   Neutrophils Absolute 3.2 1.4 - 7.0 x10E3/uL   Lymphocytes Absolute 2.8 0.7 - 3.1 x10E3/uL   Monocytes Absolute 0.6 0.1 - 0.9 x10E3/uL   EOS (ABSOLUTE) 0.1 0.0 - 0.4 x10E3/uL   Basophils Absolute 0.1 0.0 - 0.2 x10E3/uL   Immature Granulocytes 0 Not Estab. %   Immature Grans (Abs) 0.0 0.0 - 0.1 x10E3/uL    Assessment & Plan:   Problem List Items Addressed This Visit       Cardiovascular and Mediastinum   Hypertension - Primary     Other  Hypertriglyceridemia   Prediabetes   Hyperlipidemia    Blood work looks good except for kidney function is mildly elevated at 1.3.  Recommend that he increase his hydration, otherwise I do not see any abnormalities Follow up plan: Return in about 6 months (around 08/21/2024), or if symptoms worsen or fail to improve, for Hypertension prediabetes and hyperlipidemia and physical.  Counseling provided for all of the vaccine components No orders of the defined types were placed in this encounter.   Jolyne Needs, MD [facility] 02/19/2024, 4:26 PM

## 2024-03-29 ENCOUNTER — Other Ambulatory Visit (HOSPITAL_COMMUNITY): Payer: Self-pay | Admitting: *Deleted

## 2024-03-29 DIAGNOSIS — F4010 Social phobia, unspecified: Secondary | ICD-10-CM

## 2024-03-29 DIAGNOSIS — F331 Major depressive disorder, recurrent, moderate: Secondary | ICD-10-CM

## 2024-03-29 DIAGNOSIS — F410 Panic disorder [episodic paroxysmal anxiety] without agoraphobia: Secondary | ICD-10-CM

## 2024-03-29 MED ORDER — LORAZEPAM 0.5 MG PO TABS: 0.5000 mg | ORAL_TABLET | Freq: Three times a day (TID) | ORAL | 0 refills | Status: DC | PRN

## 2024-04-08 ENCOUNTER — Other Ambulatory Visit: Payer: Self-pay | Admitting: "Endocrinology

## 2024-04-11 ENCOUNTER — Encounter: Payer: Self-pay | Admitting: Cardiovascular Disease

## 2024-04-22 ENCOUNTER — Encounter (HOSPITAL_COMMUNITY): Payer: Self-pay | Admitting: Psychiatry

## 2024-04-22 ENCOUNTER — Other Ambulatory Visit: Payer: Self-pay

## 2024-04-22 ENCOUNTER — Ambulatory Visit (HOSPITAL_COMMUNITY): Admitting: Psychiatry

## 2024-04-22 VITALS — BP 131/89 | HR 73 | Ht 74.0 in | Wt 275.0 lb

## 2024-04-22 DIAGNOSIS — F410 Panic disorder [episodic paroxysmal anxiety] without agoraphobia: Secondary | ICD-10-CM

## 2024-04-22 DIAGNOSIS — F331 Major depressive disorder, recurrent, moderate: Secondary | ICD-10-CM

## 2024-04-22 DIAGNOSIS — F4010 Social phobia, unspecified: Secondary | ICD-10-CM

## 2024-04-22 MED ORDER — LORAZEPAM 0.5 MG PO TABS: 0.5000 mg | ORAL_TABLET | Freq: Three times a day (TID) | ORAL | 2 refills | Status: DC | PRN

## 2024-04-22 MED ORDER — TRAZODONE HCL 100 MG PO TABS
100.0000 mg | ORAL_TABLET | Freq: Every evening | ORAL | 2 refills | Status: DC | PRN
Start: 2024-04-22 — End: 2024-07-15

## 2024-04-22 MED ORDER — CLOMIPRAMINE HCL 50 MG PO CAPS: 50.0000 mg | ORAL_CAPSULE | Freq: Two times a day (BID) | ORAL | 2 refills | Status: DC

## 2024-04-22 NOTE — Progress Notes (Signed)
 [facility] MD/PA/NP OP Progress Note  Patient location; office Provider location; office   04/22/2024 3:10 PM Paul Parrish  MRN:  [id number]  Chief Complaint:  Chief Complaint  Patient presents with   Follow-up   Medication Refill   HPI: Patient came today to the office for his follow-up appointment.  He is taking Ativan  mostly twice a day but there are time he needs to take third pill when he has to go outside or to the public places.  He admitted not taking Rexulti  because it was making him tired and exhausted.  He also not consistent taking Anafranil  but like to restart to take 50 mg twice a day.  He has chronic depression with ruminative thoughts.  We have referred for TMS but has not able to contact them.  He is no longer taking Tranxene .  Recently he had blood work at his PCP office.  His creatinine is 1.30.  He sleeps fair with the help of CPAP.  He denies any crying spells or any major panic attack.  He continue to address chronic depressive thoughts with lack of motivation to do things.  He feels withdrawn but no agitation or paranoia or hallucination.  His appetite is fair.  He has no tremors or shakes.  He also takes trazodone .  He claimed to be not drinking alcohol or using any illegal substances.  Visit Diagnosis:    ICD-10-CM   1. Moderate episode of recurrent major depressive disorder (HCC)  F33.1 LORazepam  (ATIVAN ) 0.5 MG tablet    clomiPRAMINE  (ANAFRANIL ) 50 MG capsule    2. Social anxiety disorder  F40.10 LORazepam  (ATIVAN ) 0.5 MG tablet    traZODone  (DESYREL ) 100 MG tablet    clomiPRAMINE  (ANAFRANIL ) 50 MG capsule    3. Panic attacks  F41.0 LORazepam  (ATIVAN ) 0.5 MG tablet    clomiPRAMINE  (ANAFRANIL ) 50 MG capsule        Past Psychiatric History: Reviewed H/O anxiety and depression. Saw provider at neuropsychiatry in [place] but not happy with the treatment.  Saw Dr. Leila in 2018 and given Prozac , Xanax , Effexor , Celexa , propanolol, Lamictal , Vistaril , Adderall,  Wellbutrin ,  Cymbalta  (increased irritability ), Anafranil , Pristiq , Trintellix  and Luvox  (did not help). H/O of ECT with no response.  Had genetic testing in 2019.  Paxil, Lexapro, Celexa  and Tegretol had significant drug interaction.  No h/o suicidal attempt or inpatient. At [facility] tried Viibryd , Klonopin  but did not work. H/O heavy drinking but claims to be sober.     Past Medical History:  Past Medical History:  Diagnosis Date   Anxiety    on meds   Depression    on meds   Elevated hemoglobin (HCC)    Low testosterone     Sleep apnea    uses CPAP   Small bowel obstruction (HCC) 10/21/2016    Past Surgical History:  Procedure Laterality Date   COLONOSCOPY  08/17/2021   LUMBAR SPINE SURGERY  12/2020   NASAL SINUS SURGERY  02/2016   WISDOM TOOTH EXTRACTION      Family Psychiatric History: Reviewed  Family History:  Family History  Problem Relation Age of Onset   Kidney cancer Mother        mets from LEFT hip   Liver cancer Mother        mets from LEFT hip   Diverticulitis Mother    Hypertension Father    Diabetes Father    Diabetes Brother    Cancer Paternal Grandmother     Social History:  Social  History   Socioeconomic History   Marital status: Married    Spouse name: Not on file   Number of children: Not on file   Years of education: Not on file   Highest education level: Not on file  Occupational History   Not on file  Tobacco Use   Smoking status: Never   Smokeless tobacco: Never  Vaping Use   Vaping status: Never Used  Substance and Sexual Activity   Alcohol use: Not Currently    Comment: 10/22/2016 nothing in over 1 year   Drug use: No   Sexual activity: Yes    Partners: Female    Birth control/protection: None  Other Topics Concern   Not on file  Social History Narrative   Not on file   Social Drivers of Health   Financial Resource Strain: Not on file  Food Insecurity: Not on file  Transportation Needs: Not on file  Physical  Activity: Not on file  Stress: Not on file  Social Connections: Not on file    Allergies:  Allergies  Allergen Reactions   Amoxicillin Other (See Comments)    High fever and possible heart racing   Bactrim [Sulfamethoxazole-Trimethoprim] Other (See Comments)    Fever, body aches, chills   Penicillins Other (See Comments)    High fever Has patient had a PCN reaction causing immediate rash, facial/tongue/throat swelling, SOB or lightheadedness with hypotension: No Has patient had a PCN reaction causing severe rash involving mucus membranes or skin necrosis: No Has patient had a PCN reaction that required hospitalization No Has patient had a PCN reaction occurring within the last 10 years: No If all of the above answers are NO, then may proceed with Cephalosporin use.     Metabolic Disorder Labs: Lab Results  Component Value Date   HGBA1C 5.4 08/15/2023   Lab Results  Component Value Date   PROLACTIN 12.4 10/09/2017   PROLACTIN 14.5 09/08/2015   Lab Results  Component Value Date   CHOL 130 02/06/2024   TRIG 163 (H) 02/06/2024   HDL 30 (L) 02/06/2024   CHOLHDL 4.3 02/06/2024   LDLCALC 72 02/06/2024   LDLCALC 141 (H) 08/15/2023   Lab Results  Component Value Date   TSH 2.143 10/13/2023   TSH 3.890 08/16/2022    Therapeutic Level Labs: No results found for: LITHIUM No results found for: VALPROATE No results found for: CBMZ  Current Medications: Current Outpatient Medications  Medication Sig Dispense Refill   clorazepate  (TRANXENE ) 7.5 MG tablet Take 1 tablet (7.5 mg total) by mouth 3 (three) times daily. 90 tablet 1   fenofibrate  (TRICOR ) 48 MG tablet Take 1 tablet (48 mg total) by mouth daily. 90 tablet 3   LORazepam  (ATIVAN ) 0.5 MG tablet Take 1 tablet (0.5 mg total) by mouth 3 (three) times daily as needed for anxiety. 75 tablet 0   Melatonin 10 MG TABS Take 1 tablet by mouth at bedtime.     metoprolol  tartrate (LOPRESSOR ) 50 MG tablet Take 1 tablet (50  mg total) by mouth 2 (two) times daily. 180 tablet 3   Multiple Vitamin (MULTI-VITAMIN DAILY PO) Take 1 capsule by mouth 2 (two) times daily. Name: Life Extensions     Omega-3 Fatty Acids (FISH OIL) 1000 MG CAPS Take 1 capsule by mouth 2 (two) times daily.     propranolol  (INDERAL ) 20 MG tablet Take 1 tablet (20 mg total) by mouth 4 (four) times daily. Only as needed for tachycardia/palpitations 120 tablet 3   Risankizumab -rzaa (  SKYRIZI  PEN) 150 MG/ML SOAJ Inject 150 mg into the skin as directed. Every 12 weeks for maintenance. 1 mL 6   rosuvastatin  (CRESTOR ) 10 MG tablet Take 1 tablet (10 mg total) by mouth daily. 90 tablet 3   sildenafil  (REVATIO ) 20 MG tablet TAKE 1 TABLET BY MOUTH ONCE DAILY AS NEEDED 60 tablet 1   SKYRIZI  150 MG/ML SOSY INJECT 150MG  SUBCUTANEOUSLY EVERY 12 WEEKS AS DIRECTED. 300 mL 10   SYRINGE-NEEDLE, DISP, 3 ML 21G X 1-1/2 3 ML MISC Use to inject testosterone  every week 50 each 1   testosterone  cypionate (DEPOTESTOSTERONE CYPIONATE) 200 MG/ML injection INJECT 0.25 ML (50 MG) INTRAMUSCULARLY EVERY 10 DAYS 5 mL 0   traZODone  (DESYREL ) 100 MG tablet Take 1 tablet (100 mg total) by mouth at bedtime as needed for sleep. 30 tablet 1   No current facility-administered medications for this visit.     Musculoskeletal: Strength & Muscle Tone: within normal limits Gait & Station: normal Patient leans: N/A  Psychiatric Specialty Exam: Review of Systems  Blood pressure 131/89, pulse 73, height 6' 2 (1.88 m), weight 275 lb (124.7 kg).There is no height or weight on file to calculate BMI.  General Appearance: Casual  Eye Contact:  Fair  Speech:  Slow  Volume:  Decreased  Mood:  Dysphoric  Affect:  Constricted and Restricted  Thought Process:  Goal Directed  Orientation:  Full (Time, Place, and Person)  Thought Content: Rumination   Suicidal Thoughts:  No  Homicidal Thoughts:  No  Memory:  Immediate;   Good Recent;   Good Remote;   Fair  Judgement:  Fair  Insight:   Present  Psychomotor Activity:  Decreased  Concentration:  Concentration: Fair and Attention Span: Fair  Recall:  Fair  Fund of Knowledge: Good  Language: Good  Akathisia:  No  Handed:  Right  AIMS (if indicated): not done  Assets:  Communication Skills Desire for Improvement Housing  ADL's:  Intact  Cognition: WNL  Sleep:  Okay with trazodone  and CPAP   Screenings: GAD-7    Flowsheet Row Office Visit from 02/19/2024 in [facility] Office Visit from 12/11/2023 in [facility] Office Visit from 08/21/2023 in [facility] Office Visit from 02/20/2023 in [facility] Office Visit from 08/15/2022 in [facility]  Total GAD-7 Score 10 15 12 11 8    PHQ2-9    Flowsheet Row Office Visit from 02/19/2024 in [facility] Office Visit from 12/11/2023 in [facility] Office Visit from 08/21/2023 in [facility] Office Visit from 02/20/2023 in [facility] Office Visit from 08/15/2022 in [facility]  PHQ-2 Total Score 2 2 4 2 4   PHQ-9 Total Score 8 10 12 11 12    Flowsheet Row Office Visit from 12/11/2023 in [facility] ED from 10/13/2023 in [facility] Video Visit from 04/04/2021 in [facility]  C-SSRS RISK CATEGORY Error: Q3, 4, or 5 should not be populated when Q2 is No No Risk No Risk     Assessment and Plan: Patient is 53 year old man with history of hypertension, hypergonadism, tachycardia, prediabetic came for his follow-up appointment.  Discussed blood work results, collateral information and current medication.  He is no longer  taking Rexulti  after feeling tired and  exhausted.  He is still like to pursue TMS and like to get referral.  He feels better on Ativan  as does not feel anxious and few times had gone to the public places in grocery stores.  I encouraged to restart Anafranil  which he has been not taking regularly.  He was taking Anafranil  50 mg twice a day.  Continue trazodone  100 mg at bedtime and Ativan  0.5 mg up to 3 times a day as needed.  Discussed benzodiazepine dependence tolerance and withdrawal.  We will discontinue Rexulti  as patient stopped taking it.  Will refer to [employer] coordinator.  Will follow-up in 3 months however patient can ask an earlier appointment if needed.  Collaboration of Care: Collaboration of Care: Other provider involved in patient's care AEB notes are available in epic to review  Patient/Guardian was advised Release of Information must be obtained prior to any record release in order to collaborate their care with an outside provider. Patient/Guardian was advised if they have not already done so to contact the registration department to sign all necessary forms in order for us  to release information regarding their care.   Consent: Patient/Guardian gives verbal consent for treatment and assignment of benefits for services provided during this visit. Patient/Guardian expressed understanding and agreed to proceed.   I provided face-to-face time during this encounter.  Leni ONEIDA Client, MD 04/22/2024, 3:10 PM

## 2024-04-27 ENCOUNTER — Telehealth (HOSPITAL_COMMUNITY): Payer: Self-pay

## 2024-04-27 NOTE — Telephone Encounter (Signed)
 [employer] Coordinator placed outgoing call to pt again. Call was sent to vm. Coordinator left message requesting call back to discuss TMS tx and eligibility requirements.

## 2024-05-04 ENCOUNTER — Other Ambulatory Visit: Payer: Self-pay | Admitting: Medical Genetics

## 2024-05-11 ENCOUNTER — Other Ambulatory Visit (HOSPITAL_COMMUNITY)
Admission: RE | Admit: 2024-05-11 | Discharge: 2024-05-11 | Disposition: A | Discharge status: Home / Self Care | Payer: Self-pay | Source: Ambulatory Visit | Attending: Oncology | Admitting: Oncology

## 2024-05-22 LAB — GENECONNECT MOLECULAR SCREEN: Genetic Analysis Overall Interpretation: NEGATIVE

## 2024-05-27 ENCOUNTER — Other Ambulatory Visit: Payer: Self-pay | Admitting: Family Medicine

## 2024-06-01 ENCOUNTER — Ambulatory Visit: Attending: Physician Assistant | Admitting: Physician Assistant

## 2024-06-01 ENCOUNTER — Encounter: Payer: Self-pay | Admitting: Physician Assistant

## 2024-06-01 VITALS — BP 113/70 | HR 68 | Ht 74.0 in | Wt 269.6 lb

## 2024-06-01 DIAGNOSIS — E781 Pure hyperglyceridemia: Secondary | ICD-10-CM

## 2024-06-01 DIAGNOSIS — R000 Tachycardia, unspecified: Secondary | ICD-10-CM

## 2024-06-01 DIAGNOSIS — I471 Supraventricular tachycardia, unspecified: Secondary | ICD-10-CM

## 2024-06-01 DIAGNOSIS — E782 Mixed hyperlipidemia: Secondary | ICD-10-CM | POA: Diagnosis not present

## 2024-06-01 NOTE — Progress Notes (Signed)
 Cardiology Office Note   Date:  06/01/2024  ID:  Paul Parrish, DOB [birth year], MRN [id number] PCP: Dettinger, Fonda LABOR, MD  [facility] Providers Cardiologist:  None   History of Present Illness Paul Parrish is a 53 y.o. male with a past medical history of anxiety, depression, anemia, OSA here for follow-up appointment.  Patient has given to [employer] regularly for blood donation but the last several times he attempted to give the [facility] refused him.  He has had lack of energy and fatigue.  Frequently naps.  Has OSA and his CPAP is working properly.  Wakes up feeling well.  Over the past several months he has a impression that his heart rate has been fast.  No weight loss, sweats, or diarrhea.  He works from home.  He was started on metoprolol  Saturday night and was feeling better.  Heart rate was better.  Discussed getting an echo at his last follow-up visit but given the cost he wanted to hold off for now and see how he felt after being on the medication after several months.  He was last seen February 2025 and was in the ER January 13 but atypical chest pain.  Having more palpitations for the past several months.  EKG shows sinus tachycardia in the 120s.  Event monitor revealed episodes of SVT anywhere from 13-16 beats with max heart rate of 194.  He was in the [facility] for sinus tachycardia on January 13 with a heart rate of 112.  No ST or T wave changes.  His metoprolol  was increased to 50 mg p.o. twice daily and was feeling better on the higher dose.  Was not getting any regular exercise.  Weight was 179 pounds.  Has not been wearing his CPAP.  Today, he presents with a hx of supraventricular tachycardia for follow-up regarding heart rate management.  He experiences episodes of increased heart rate two to three times since his last visit, managed with propranolol . Metoprolol  50 mg twice daily effectively controls his heart rate. In January, his heart rate reached 180 bpm,  leading to an ER visit for chest pain and palpitations, with a heart monitor recording 194 bpm during sleep. He can predict when his heart rate is increasing and takes propranolol  to reduce it.  He is concerned that exercise might trigger an increased heart rate and has not been exercising regularly due to this concern and other life events. He worries about his heart rate remaining elevated during physical activity.  He takes Crestor  10 mg daily for cholesterol management. His LDL is 72 mg/dL, triglycerides are slightly elevated at 163 mg/dL, and total cholesterol is 130 mg/dL. His creatinine level is 1.3 mg/dL, consistent with previous results.  A KardiaMobile device indicated atrial fibrillation with a heart rate of 148 bpm, followed by readings of tachycardia. He has not observed atrial fibrillation on the device since that initial reading. He is hesitant to wear another monitor due to the discomfort.   Reports no shortness of breath nor dyspnea on exertion. Reports no chest pain, pressure, or tightness. No edema, orthopnea, PND.   Discussed the use of AI scribe software for clinical note transcription with the patient, who gave verbal consent to proceed.  ROS: Pertinent ROS in HPI  Studies Reviewed     Long term monitor 10/10/23     Predominant rhythm is sinus rhythm.   He had 3 episodes of supraventricular tachycardia.  The fastest interval lasted 13 beats with a maximal heart  rate of 194.  The longest beat lasted 16 beats with an average heart rate of 107.   Rare premature atrial contractions, rare premature ventricular contractions   No serious arrhythmias were observed.     Patch Wear Time:  7 days and 21 hours (2025-01-14T18:06:20-0500 to 2025-01-22T15:17:03-499)   Patient had a min HR of 44 bpm, max HR of 194 bpm, and avg HR of 79 bpm. Predominant underlying rhythm was Sinus Rhythm. 3 Supraventricular Tachycardia runs occurred, the run with the fastest interval lasting 13 beats  with a max rate of 194 bpm, the  longest lasting 16 beats with an avg rate of 107 bpm. Some episodes of Supraventricular Tachycardia may be possible Atrial Tachycardia with variable block. Isolated SVEs were rare (<1.0%), SVE Couplets were rare (<1.0%), and no SVE Triplets were present.  Isolated VEs were rare (<1.0%), VE Couplets were rare (<1.0%), and no VE Triplets were present.     Physical Exam VS:  BP 113/70 (BP Location: Right Arm, Patient Position: Sitting, Cuff Size: Normal)   Pulse 68   Ht 6' 2 (1.88 m)   Wt 269 lb 9.6 oz (122.3 kg)   SpO2 97%   BMI 34.61 kg/m        Wt Readings from Last 3 Encounters:  06/01/24 269 lb 9.6 oz (122.3 kg)  02/19/24 273 lb (123.8 kg)  02/11/24 277 lb (125.6 kg)    GEN: Well nourished, well developed in no acute distress NECK: No JVD; No carotid bruits CARDIAC: RRR, no murmurs, rubs, gallops RESPIRATORY:  Clear to auscultation without rales, wheezing or rhonchi  ABDOMEN: Soft, non-tender, non-distended EXTREMITIES:  No edema; No deformity   ASSESSMENT AND PLAN  Paroxysmal supraventricular tachycardia with suspected paroxysmal atrial fibrillation Episodes of tachycardia with AFib detected once on KardiaMobile. AFib poses stroke risk, requiring anticoagulation if confirmed. Current management with metoprolol  and propranolol . Discussed potential COVID vaccine correlation, no definitive studies. Preferred monitoring with KardiaMobile before heart monitor. - Continue metoprolol  50 mg twice daily. - Use propranolol  as needed for episodes. - Monitor heart rate using KardiaMobile and report AFib readings. - Consider heart monitor if AFib detected again to assess stroke risk and anticoagulation need.  Mixed hyperlipidemia LDL controlled at 72 mg/dL. Triglycerides slightly elevated at 163 mg/dL, target <849 mg/dL. Total cholesterol at 130 mg/dL. Crestor  10 mg daily effective. - Continue Crestor  10 mg daily.  Anxiety disorder Anxiety managed with  Ativan  as needed, occasionally up to three doses per day during stress. - Continue Ativan  as needed for anxiety management.   Dispo:  He can follow-up in 6 months  Signed, Orren LOISE Fabry, PA-C

## 2024-06-01 NOTE — Patient Instructions (Addendum)
 Medication Instructions:  Your physician recommends that you continue on your current medications as directed. Please refer to the Current Medication list given to you today. *If you need a refill on your cardiac medications before your next appointment, please call your pharmacy*  Lab Work: None ordered If you have labs (blood work) drawn today and your tests are completely normal, you will receive your results only by: MyChart Message (if you have MyChart) OR A paper copy in the mail If you have any lab test that is abnormal or we need to change your treatment, we will call you to review the results.  Testing/Procedures: None ordered  Follow-Up: At [facility], you and your health needs are our priority.  As part of our continuing mission to provide you with exceptional heart care, our providers are all part of one team.  This team includes your primary Cardiologist (physician) and Advanced Practice Providers or APPs (Physician Assistants and Nurse Practitioners) who all work together to provide you with the care you need, when you need it.  Your next appointment:   6 month(s)  Provider:   Llewellyn Riles, MD    We recommend signing up for the patient portal called "MyChart".  Sign up information is provided on this After Visit Summary.  MyChart is used to connect with patients for Virtual Visits (Telemedicine).  Patients are able to view lab/test results, encounter notes, upcoming appointments, etc.  Non-urgent messages can be sent to your provider as well.   To learn more about what you can do with MyChart, go to [email or web address].   Other Instructions

## 2024-06-17 ENCOUNTER — Other Ambulatory Visit

## 2024-06-22 ENCOUNTER — Other Ambulatory Visit (HOSPITAL_COMMUNITY): Payer: Self-pay

## 2024-06-22 ENCOUNTER — Telehealth: Payer: Self-pay

## 2024-06-22 ENCOUNTER — Ambulatory Visit: Admitting: "Endocrinology

## 2024-06-22 ENCOUNTER — Encounter: Payer: Self-pay | Admitting: "Endocrinology

## 2024-06-22 VITALS — BP 112/82 | HR 64 | Ht 74.0 in | Wt 272.2 lb

## 2024-06-22 DIAGNOSIS — R7303 Prediabetes: Secondary | ICD-10-CM

## 2024-06-22 DIAGNOSIS — E291 Testicular hypofunction: Secondary | ICD-10-CM | POA: Diagnosis not present

## 2024-06-22 DIAGNOSIS — D751 Secondary polycythemia: Secondary | ICD-10-CM

## 2024-06-22 LAB — CBC WITH DIFFERENTIAL/PLATELET
Basophils Absolute: 0.1 x10E3/uL (ref 0.0–0.2)
Basos: 1 %
EOS (ABSOLUTE): 0.1 x10E3/uL (ref 0.0–0.4)
Eos: 2 %
Hematocrit: 55 % — ABNORMAL HIGH (ref 37.5–51.0)
Hemoglobin: 17.6 g/dL (ref 13.0–17.7)
Immature Grans (Abs): 0 x10E3/uL (ref 0.0–0.1)
Immature Granulocytes: 0 %
Lymphocytes Absolute: 2.4 x10E3/uL (ref 0.7–3.1)
Lymphs: 35 %
MCH: 29.4 pg (ref 26.6–33.0)
MCHC: 32 g/dL (ref 31.5–35.7)
MCV: 92 fL (ref 79–97)
Monocytes Absolute: 0.7 x10E3/uL (ref 0.1–0.9)
Monocytes: 11 %
Neutrophils Absolute: 3.5 x10E3/uL (ref 1.4–7.0)
Neutrophils: 51 %
Platelets: 265 x10E3/uL (ref 150–450)
RBC: 5.98 x10E6/uL — ABNORMAL HIGH (ref 4.14–5.80)
RDW: 13.9 % (ref 11.6–15.4)
WBC: 6.8 x10E3/uL (ref 3.4–10.8)

## 2024-06-22 LAB — TESTOSTERONE, FREE, TOTAL, SHBG
Sex Hormone Binding: 28 nmol/L (ref 19.3–76.4)
Testosterone, Free: 8.4 pg/mL (ref 7.2–24.0)
Testosterone: 533 ng/dL (ref 264–916)

## 2024-06-22 MED ORDER — TESTOSTERONE 20.25 MG/ACT (1.62%) TD GEL: TRANSDERMAL | 0 refills | Status: DC

## 2024-06-22 NOTE — Progress Notes (Signed)
 06/22/2024      Endocrinology follow-up note   HPI: Paul Parrish is a 53 y.o.-year-old man.  - He is being seen in follow-up for  hypogonadism.  He was diagnosed with hypogonadism in 2014. -Due to polycythemia, he undergoes phlebotomy periodically.  He is currently on testosterone  50 mg every 10 days.  His total testosterone  is on target, however patient continues to have erythrocytosis.    Patient wishes to continue on testosterone  treatment.  He reports improvement in his symptoms including fatigue and low libido.    -He reports history of seizures as a young man until his teenage years. -He denies any history of head injury.  - He also is recently diagnosed with sleep apnea currently on CPAP.  - He fathers 2 teenage boys biologically. - He has significant mood disorder on multiple antidepressants and antianxiety. No herbal medicines. -He denies family history of premature coronary artery disease.   ROS: Limited as above.  PE: BP 112/82   Pulse 64   Ht 6' 2 (1.88 m)   Wt 272 lb 3.2 oz (123.5 kg)   BMI 34.95 kg/m  Wt Readings from Last 3 Encounters:  06/22/24 272 lb 3.2 oz (123.5 kg)  06/01/24 269 lb 9.6 oz (122.3 kg)  02/19/24 273 lb (123.8 kg)     Recent Results (from the past 2160 hours)  GeneConnect Molecular Screen - Blood ([facility])     Status: None   Collection Time: 05/11/24  3:11 PM  Result Value Ref Range   Genetic Analysis Overall Interpretation Negative    Genetic Disease Assessed      This is a screening test and does not detect all pathogenic or likely pathogenic variant(s) in the tested genes; diagnostic testing is recommended for individuals with a personal or family history of heart disease or hereditary cancer. [facility] Tier One  Population Screen is a screening test that analyzes 11 genes related to hereditary breast and ovarian cancer (HBOC) syndrome, Lynch syndrome, and familial hypercholesterolemia. This test only reports clinically  significant pathogenic and likely  pathogenic variants but does not report variants of uncertain significance (VUS). In addition, analysis of the PMS2 gene excludes exons 11-15, which overlap with a known pseudogene (PMS2CL).    Genetic Analysis Report      No pathogenic or likely pathogenic variants were detected in the genes analyzed by this test.Genetic test results should be interpreted in the context of an individual's personal medical and family history. Alteration to medical management is NOT  recommended based solely on this result. Clinical correlation is advised.Additional Considerations- This is a screening test; individuals may still carry pathogenic or likely pathogenic variant(s) in the tested genes that are not detected by this test.-  For individuals at risk for these or other related conditions based on factors including personal or family history, diagnostic testing is recommended.- The absence of pathogenic or likely pathogenic variant(s) in the analyzed genes, while reassuring,  does not eliminate the possibility of a hereditary condition; there are other variants and genes associated with heart disease and hereditary cancer that are not included in this test.    Genes Tested See Notes     Comment: APOB, BRCA1, BRCA2, EPCAM, LDLR, LDLRAP1, PCSK9, PMS2, MLH1, MSH2, MSH6   Disclaimer See Notes     Comment: This test was developed and validated by [facility] This test has not been cleared or approved by the [place]  Food and Drug Administration (FDA). The [facility] laboratory is accredited  by the College of American Pathologists (CAP) and certified under  the Clinical Laboratory Improvement Amendments (CLIA #: [id number]) to perform high-complexity clinical tests. This test is used for clinical purposes. It should not be regarded as investigational or for research.    Sequencing Location See Notes     Comment: Sequencing done at [facility] [address], [address],  [place] [address] (CLIA# [id number])   Interpretation Methods and Limitations See Notes     Comment: Extracted DNA is enriched for targeted regions and then sequenced using the [facility] Exome+ (R) assay on an Illumina DNA sequencing system. Data is then aligned to a modified version of GRCh38 and all genes are analyzed using the MANE transcript and MANE  Plus Clinical transcript, when available. Small variant calling is completed using a customized version of [employer]'s DNAseq software, augmented by a proprietary small variant caller for difficult variants. Copy number variants (CNVs) are then called  using a proprietary bioinformatics pipeline based on depth analysis with a comparison to similarly sequenced samples. Analysis of the PMS2 gene is limited to exons 1-10. The interpretation and reporting of variants in APOB, PCSK9, and LDLR is specific to  familial hypercholesterolemia; variants associated with hypobetalipoproteinemia are not included. Interpretation is based upon guidelines published by the [employer] and [employer]), the Association for Mol ecular Pathology  (AMP) or their modification by [employer] when available and/or review of previous clinical assertions available in the [employer]. Interpretation is limited to the transcripts indicated on the report and +/- 10 bp into  intronic regions, except as noted below. [facility] variant classifications include pathogenic, likely pathogenic, variant of uncertain significance (VUS), likely benign, and benign. Only variants classified as pathogenic and likely pathogenic are included in  the report. All reported variants are confirmed through secondary manual inspection of DNA sequence data or orthogonal testing. Risk estimations and management guidelines included in this report are based on analysis of primary literature and  recommendations of applicable professional societies, and should  be regarded as approximations.Based on validation studies, this assay delivers > 99% sensitivity and specificity for single nucleotide variants and insertions  and deletions (indels) up to  20 bp. Larger indels and complex variants are also reported but sensitivity may be reduced. Based on validation studies, this assay delivers > 99% sensitivity to multi-exon CNVs and > 90% sensitivity to single-exon CNVs. This test may not detect variants  in challenging regions (such as short tandem repeats, homopolymer runs, and segment duplications), sub-exonic CNVs, chromosomal aneuploidy, or variants in the presence of mosaicism. Phasing will be attempted and reported, when possible. Structural  rearrangements such as inversions, translocations, and gene conversions are not tested in this assay unless explicitly indicated. Additionally, deep intronic, promoter, and enhancer regions may not be covered. It is important to note that this is a  screening test and cannot detect all disease-causing variants. A negative result does not guarantee the absence of a rare, undetectable variant in the genes analyzed; consider using a diagnostic test if there i s significant personal and/or family history  of one of the conditions analyzed by this test. Any potential incidental findings outside of these genes and conditions will not be identified, nor reported. The results of a genetic test may be influenced by various factors, including bone marrow  transplantation, blood transfusions, or in rare cases, hematolymphoid neoplasms.Gene Specific Notes:APOB: analysis is limited to c.10580G>A and c.10579C>T; BRCA1: sequencing analysis extends to  CDS +/-20 bp; BRCA2: sequencing analysis extends to CDS  +/-20 bp. EPCAM: analysis is limited to CNVof exons 8-9; LDLR: analysis includes CNV ofthe promoter; MLH1: analysis includes CNV of the promoter; PMS2: analysis is limited to exons 1-10.Donnice JINNY Kemp, PhD, [email or web address]    CBC with Differential/Platelet     Status: Abnormal   Collection Time: 06/17/24  8:34 AM  Result Value Ref Range   WBC 6.8 3.4 - 10.8 x10E3/uL   RBC 5.98 (H) 4.14 - 5.80 x10E6/uL   Hemoglobin 17.6 13.0 - 17.7 g/dL   Hematocrit 44.9 (H) 62.4 - 51.0 %   MCV 92 79 - 97 fL   MCH 29.4 26.6 - 33.0 pg   MCHC 32.0 31.5 - 35.7 g/dL   RDW 86.0 88.3 - 84.5 %   Platelets 265 150 - 450 x10E3/uL   Neutrophils 51 Not Estab. %   Lymphs 35 Not Estab. %   Monocytes 11 Not Estab. %   Eos 2 Not Estab. %   Basos 1 Not Estab. %   Neutrophils Absolute 3.5 1.4 - 7.0 x10E3/uL   Lymphocytes Absolute 2.4 0.7 - 3.1 x10E3/uL   Monocytes Absolute 0.7 0.1 - 0.9 x10E3/uL   EOS (ABSOLUTE) 0.1 0.0 - 0.4 x10E3/uL   Basophils Absolute 0.1 0.0 - 0.2 x10E3/uL   Immature Granulocytes 0 Not Estab. %   Immature Grans (Abs) 0.0 0.0 - 0.1 x10E3/uL  Testosterone , Free, Total, SHBG     Status: None   Collection Time: 06/17/24  8:34 AM  Result Value Ref Range   Testosterone  533 264 - 916 ng/dL    Comment: Adult male reference interval is based on a population of healthy nonobese males (BMI <30) between 76 and 33 years old. Travison, et.al. JCEM [phone]. PMID: [id number].    Testosterone , Free 8.4 7.2 - 24.0 pg/mL   Sex Hormone Binding 28.0 19.3 - 76.4 nmol/L      ASSESSMENT: 1.  Hypogonadism  2.  Polycythemia 3.  Prediabetes  PLAN:  1. Hypogonadism  -His last phlebotomy was in January 2025.  He presents with corrected total testosterone  at 533, however still elevated RBC 5.98, and hematocrit of 55.  He is approached to discontinue testosterone  cypionate and discussed initiated AndroGel  20.25 mg topically daily until next measurement.  His testosterone  will be optimized based on his labs and CBC response. His history of  secondary polycythemia which continues to require periodic phlebotomy, as well as sleep apnea requiring CPAP treatment making him a poor candidate for more generous testosterone   replacement. Regarding his weight concern: His insurance did not provide coverage for injectable weight loss medications. He remains a good fit for lifestyle medicine.  - he acknowledges that there is a room for improvement in his food and drink choices. - Suggestion is made for him to avoid simple carbohydrates  from his diet including Cakes, Sweet Desserts, Ice Cream, Soda (diet and regular), Sweet Tea, Candies, Chips, Cookies, Store Bought Juices, Alcohol , Artificial Sweeteners,  Coffee Creamer, and Sugar-free Products, Lemonade. This will help patient to have more stable blood glucose profile and potentially avoid unintended weight gain.  The following Lifestyle Medicine recommendations according to American College of Lifestyle Medicine  [facility]) were discussed and and offered to patient and he  agrees to start the journey:  A. Whole Foods, Plant-Based Nutrition comprising of fruits and vegetables, plant-based proteins, whole-grain carbohydrates was discussed in detail with the patient.   A list for source of those nutrients were also provided to  the patient.  Patient will use only water or unsweetened tea for hydration. B.  The need to stay away from risky substances including alcohol, smoking; obtaining 7 to 9 hours of restorative sleep, at least 150 minutes of moderate intensity exercise weekly, the importance of healthy social connections,  and stress management techniques were discussed. C.  A full color page of  Calorie density of various food groups per pound showing examples of each food groups was provided to the patient.   He is advised to continue close follow-up with his PMD for primary care needs.   I spent  22  minutes in the care of the patient today including review of labs from Thyroid  Function, CMP, and other relevant labs ; imaging/biopsy records (current and previous including abstractions from other facilities); face-to-face time discussing  his lab results and symptoms,  medications doses, his options of short and long term treatment based on the latest standards of care / guidelines;   and documenting the encounter.  Oneil Lunger  participated in the discussions, expressed understanding, and voiced agreement with the above plans.  All questions were answered to his satisfaction. he is encouraged to contact clinic should he have any questions or concerns prior to his return visit.    Ranny Earl, MD Phone: [phone]  Fax: [phone]  -  This note was partially dictated with voice recognition software. Similar sounding words can be transcribed inadequately or may not  be corrected upon review.  06/22/2024, 5:05 PM

## 2024-06-22 NOTE — Telephone Encounter (Signed)
 Pharmacy Patient Advocate Encounter   Received notification from CoverMyMeds that prior authorization for Testosterone  1.62% gel is required/requested.   Insurance verification completed.   The patient is insured through [facility] .   Per test claim: PA required; PA submitted to above mentioned insurance via Latent Key/confirmation #/EOC [id number] Status is pending

## 2024-06-23 ENCOUNTER — Ambulatory Visit: Admitting: Nurse Practitioner

## 2024-06-23 ENCOUNTER — Encounter: Payer: Self-pay | Admitting: Nurse Practitioner

## 2024-06-23 VITALS — BP 135/86 | HR 67 | Temp 97.7°F | Ht 74.0 in | Wt 271.0 lb

## 2024-06-23 DIAGNOSIS — J069 Acute upper respiratory infection, unspecified: Secondary | ICD-10-CM | POA: Diagnosis not present

## 2024-06-23 DIAGNOSIS — R051 Acute cough: Secondary | ICD-10-CM | POA: Insufficient documentation

## 2024-06-23 DIAGNOSIS — R6889 Other general symptoms and signs: Secondary | ICD-10-CM | POA: Diagnosis not present

## 2024-06-23 DIAGNOSIS — J029 Acute pharyngitis, unspecified: Secondary | ICD-10-CM | POA: Insufficient documentation

## 2024-06-23 LAB — VERITOR FLU A/B WAIVED
Influenza A: NEGATIVE
Influenza B: NEGATIVE

## 2024-06-23 MED ORDER — AZELASTINE HCL 0.1 % NA SOLN: 1.0000 | Freq: Two times a day (BID) | NASAL | 12 refills | Status: DC

## 2024-06-23 MED ORDER — GUAIFENESIN 400 MG PO TABS: 400.0000 mg | ORAL_TABLET | Freq: Four times a day (QID) | ORAL | 0 refills | Status: DC | PRN

## 2024-06-23 NOTE — Telephone Encounter (Signed)
 Pharmacy Patient Advocate Encounter  Received notification from [employer] that Prior Authorization for Testosterone  1.62% gel has been APPROVED from 06/22/24 to 06/22/25   PA #/Case ID/Reference #: [id number]

## 2024-06-23 NOTE — Progress Notes (Signed)
 Follow-up    Subjective:  Patient ID: Paul Parrish, male    DOB: [birth year], 53 y.o.   MRN: [id number]  Patient Care Team: Dettinger, Fonda LABOR, MD as PCP - General (Family Medicine) Pennie Elsie PARAS, FNP (Family Medicine) Kassie Mallick, MD (Inactive) as Consulting Physician (Endocrinology) Livingston Rigg, MD as Consulting Physician (Dermatology)   Chief Complaint:  Sore Throat (Symptoms started Monday ), Cough, and Nasal Congestion   HPI: Paul Parrish is a 53 y.o. male presenting on 06/23/2024 for Sore Throat (Symptoms started Monday ), Cough, and Nasal Congestion   Discussed the use of AI scribe software for clinical note transcription with the patient, who gave verbal consent to proceed.  History of Present Illness Paul Parrish is a 53 year old male who presents with a sore throat and persistent cough.  His symptoms began on Monday night with an itchy throat, which progressed to a sore throat by Tuesday. The sore throat is most painful when drinking liquids such as water, and as of today, it is the worst it has been.  He also has a persistent cough that has not improved with over-the-counter medications. He has tried Delsym, which usually helps, but it has not been effective this time. The cough sometimes produces white phlegm.  No fever, but he mentions waking up with a wet shirt at night. His temperature has remained around 98 degrees when checked. No muscle aches, headache, or ear pain. His appetite remains good, and he is able to drink fluids without difficulty.  His wife was sick a couple of weeks ago, and she swabbed him for the flu. He is currently awaiting the results of an at-home COVID test that his wife is obtaining.      Relevant past medical, surgical, family, and social history reviewed and updated as indicated.  Allergies and medications reviewed and updated. Data reviewed: Chart in Epic.   Past Medical History:  Diagnosis Date   Anxiety    on meds   Depression     on meds   Elevated hemoglobin    Low testosterone     Sleep apnea    uses CPAP   Small bowel obstruction (HCC) 10/21/2016    Past Surgical History:  Procedure Laterality Date   COLONOSCOPY  08/17/2021   LUMBAR SPINE SURGERY  12/2020   NASAL SINUS SURGERY  02/2016   WISDOM TOOTH EXTRACTION      Social History   Socioeconomic History   Marital status: Married    Spouse name: Not on file   Number of children: Not on file   Years of education: Not on file   Highest education level: Not on file  Occupational History   Not on file  Tobacco Use   Smoking status: Never   Smokeless tobacco: Never  Vaping Use   Vaping status: Never Used  Substance and Sexual Activity   Alcohol use: Not Currently    Comment: 10/22/2016 nothing in over 1 year   Drug use: No   Sexual activity: Yes    Partners: Female    Birth control/protection: None  Other Topics Concern   Not on file  Social History Narrative   Not on file   Social Drivers of Health   Financial Resource Strain: Not on file  Food Insecurity: Not on file  Transportation Needs: Not on file  Physical Activity: Not on file  Stress: Not on file  Social Connections: Not on file  Intimate Partner Violence: Not on file    Outpatient  Encounter Medications as of 06/23/2024  Medication Sig   azelastine  (ASTELIN ) 0.1 % nasal spray Place 1 spray into both nostrils 2 (two) times daily. Use in each nostril as directed   clomiPRAMINE  (ANAFRANIL ) 50 MG capsule Take 1 capsule (50 mg total) by mouth 2 (two) times daily. (Patient taking differently: Take 50 mg by mouth daily.)   fenofibrate  (TRICOR ) 48 MG tablet Take 1 tablet (48 mg total) by mouth daily.   guaifenesin  (HUMIBID E) 400 MG TABS tablet Take 1 tablet (400 mg total) by mouth every 6 (six) hours as needed.   LORazepam  (ATIVAN ) 0.5 MG tablet Take 1 tablet (0.5 mg total) by mouth 3 (three) times daily as needed for anxiety.   Melatonin 10 MG TABS Take 1 tablet by mouth at  bedtime.   metoprolol  tartrate (LOPRESSOR ) 50 MG tablet Take 1 tablet (50 mg total) by mouth 2 (two) times daily.   Multiple Vitamin (MULTI-VITAMIN DAILY PO) Take 1 capsule by mouth 2 (two) times daily. Name: Life Extensions   Omega-3 Fatty Acids (FISH OIL) 1000 MG CAPS Take 1 capsule by mouth 2 (two) times daily.   propranolol  (INDERAL ) 20 MG tablet Take 1 tablet (20 mg total) by mouth 4 (four) times daily. Only as needed for tachycardia/palpitations (Patient taking differently: Take 20 mg by mouth as needed (For fast heartrate). Only as needed for tachycardia/palpitations)   Risankizumab -rzaa (SKYRIZI  PEN) 150 MG/ML SOAJ Inject 150 mg into the skin as directed. Every 12 weeks for maintenance.   rosuvastatin  (CRESTOR ) 10 MG tablet Take 1 tablet (10 mg total) by mouth daily.   sildenafil  (REVATIO ) 20 MG tablet TAKE 1 TABLET BY MOUTH ONCE DAILY AS NEEDED   SKYRIZI  150 MG/ML SOSY INJECT 150MG  SUBCUTANEOUSLY EVERY 12 WEEKS AS DIRECTED.   SYRINGE-NEEDLE, DISP, 3 ML 21G X 1-1/2 3 ML MISC Use to inject testosterone  every week   Testosterone  20.25 MG/ACT (1.62%) GEL Apply 20.25mg /ACT on one shoulder every morning (alternate shoulders every day).   traZODone  (DESYREL ) 100 MG tablet Take 1 tablet (100 mg total) by mouth at bedtime as needed for sleep.   No facility-administered encounter medications on file as of 06/23/2024.    Allergies  Allergen Reactions   Amoxicillin Other (See Comments)    High fever and possible heart racing   Bactrim [Sulfamethoxazole-Trimethoprim] Other (See Comments)    Fever, body aches, chills   Penicillins Other (See Comments)    High fever Has patient had a PCN reaction causing immediate rash, facial/tongue/throat swelling, SOB or lightheadedness with hypotension: No Has patient had a PCN reaction causing severe rash involving mucus membranes or skin necrosis: No Has patient had a PCN reaction that required hospitalization No Has patient had a PCN reaction occurring  within the last 10 years: No If all of the above answers are NO, then may proceed with Cephalosporin use.     Pertinent ROS per HPI, otherwise unremarkable      Objective:  BP 135/86   Pulse 67   Temp 97.7 F (36.5 C) (Temporal)   Ht 6' 2 (1.88 m)   Wt 271 lb (122.9 kg)   SpO2 98%   BMI 34.79 kg/m    Wt Readings from Last 3 Encounters:  06/23/24 271 lb (122.9 kg)  06/22/24 272 lb 3.2 oz (123.5 kg)  06/01/24 269 lb 9.6 oz (122.3 kg)    Physical Exam Vitals and nursing note reviewed.  Constitutional:      General: He is not in acute distress. HENT:  Head: Normocephalic and atraumatic.     Right Ear: Tympanic membrane, ear canal and external ear normal. There is no impacted cerumen.     Left Ear: Tympanic membrane, ear canal and external ear normal. There is no impacted cerumen.     Nose: Congestion present.     Mouth/Throat:     Mouth: Mucous membranes are moist.  Eyes:     General: No scleral icterus.    Extraocular Movements: Extraocular movements intact.     Conjunctiva/sclera: Conjunctivae normal.     Pupils: Pupils are equal, round, and reactive to light.  Cardiovascular:     Heart sounds: Normal heart sounds.  Pulmonary:     Effort: Pulmonary effort is normal.     Breath sounds: Normal breath sounds.  Musculoskeletal:        General: Normal range of motion.     Right lower leg: No edema.     Left lower leg: No edema.  Skin:    General: Skin is warm and dry.  Neurological:     Mental Status: He is alert and oriented to person, place, and time.  Psychiatric:        Mood and Affect: Mood normal.        Behavior: Behavior normal.        Thought Content: Thought content normal.        Judgment: Judgment normal.    Physical Exam HEENT: Throat normal. No sinus tenderness.     Results for orders placed or performed in visit on 06/23/24  Veritor Flu A/B Waived   Collection Time: 06/23/24  3:15 PM  Result Value Ref Range   Influenza A Negative  Negative   Influenza B Negative Negative       Pertinent labs & imaging results that were available during my care of the patient were reviewed by me and considered in my medical decision making.  Assessment & Plan:  Paul Parrish was seen today for sore throat, cough and nasal congestion.  Diagnoses and all orders for this visit:  Flu-like symptoms -     Veritor Flu A/B Waived  Viral pharyngitis  URI with cough and congestion  Other orders -     guaifenesin  (HUMIBID E) 400 MG TABS tablet; Take 1 tablet (400 mg total) by mouth every 6 (six) hours as needed. -     azelastine  (ASTELIN ) 0.1 % nasal spray; Place 1 spray into both nostrils 2 (two) times daily. Use in each nostril as directed    Paul Parrish is a 53 year old Caucasian male seen today for URI symptoms, no acute distress Assessment and Plan Assessment & Plan Acute cough and sore throat Acute sore throat and cough, likely viral URI. COVID-19 possible due to exposure. - Await at-home COVID test results, report if positive. - Maintain hydration. - Guaifenesin  400 mg every 8 hours as needed for cough - Astelin  twice daily for congestion Increase hydration, Tylenol /ibuprofen for fever     Continue all other maintenance medications.  Follow up plan: Return if symptoms worsen or fail to improve.   Continue healthy lifestyle choices, including diet (rich in fruits, vegetables, and lean proteins, and low in salt and simple carbohydrates) and exercise (at least 30 minutes of moderate physical activity daily).  Educational handout given for  Viral Respiratory Infection A viral respiratory infection is an illness that affects parts of the body that are used for breathing. These include the lungs, nose, and throat. It is caused by a germ called a virus. Some  examples of this kind of infection are: A cold. The flu (influenza). A respiratory syncytial virus (RSV) infection. What are the causes? This condition is caused by a virus. It  spreads from person to person. You can get the virus if: You breathe in droplets from someone who is sick. You come in contact with people who are sick. You touch mucus or other fluid from a person who is sick. What are the signs or symptoms? Symptoms of this condition include: A stuffy or runny nose. A sore throat. A cough. Shortness of breath. Trouble breathing. Yellow or green fluid in the nose. Other symptoms may include: A fever. Sweating or chills. Tiredness (fatigue). Achy muscles. A headache. How is this treated? This condition may be treated with: Medicines that treat viruses. Medicines that make it easy to breathe. Medicines that are sprayed into the nose. Acetaminophen  or NSAIDs, such as ibuprofen, to treat fever. Follow these instructions at home: Managing pain and congestion Take over-the-counter and prescription medicines only as told by your doctor. If you have a sore throat, gargle with salt water. Do this 3-4 times a day or as needed. To make salt water, dissolve -1 tsp (3-6 g) of salt in 1 cup (237 mL) of warm water. Make sure that all the salt dissolves. Use nose drops made from salt water. This helps with stuffiness (congestion). It also helps soften the skin around your nose. Take 2 tsp (10 mL) of honey at bedtime to lessen coughing at night. Do not give honey to children who are younger than 32 year old. Drink enough fluid to keep your pee (urine) pale yellow. General instructions  Rest as much as possible. Do not drink alcohol. Do not smoke or use any products that contain nicotine or tobacco. If you need help quitting, ask your doctor. Keep all follow-up visits. How is this prevented?     Get a flu shot every year. Ask your doctor when you should get your flu shot. Do not let other people get your germs. If you are sick: Wash your hands with soap and water often. Wash your hands after you cough or sneeze. Wash hands for at least 20 seconds. If you  cannot use soap and water, use hand sanitizer. Cover your mouth when you cough. Cover your nose and mouth when you sneeze. Do not share cups or eating utensils. Clean commonly used objects often. Clean commonly touched surfaces. Stay home from work or school. Avoid contact with people who are sick during cold and flu season. This is in fall and winter. Get help if: Your symptoms last for 10 days or longer. Your symptoms get worse over time. You have very bad pain in your face or forehead. Parts of your jaw or neck get very swollen. You have shortness of breath. Get help right away if: You feel pain or pressure in your chest. You have trouble breathing. You faint or feel like you will faint. You keep vomiting and it gets worse. You feel confused. These symptoms may be an emergency. Get help right away. Call your local emergency services (911 in the [place] Do not wait to see if the symptoms will go away. Do not drive yourself to the hospital. Summary A viral respiratory infection is an illness that affects parts of the body that are used for breathing. Examples of this illness include a cold, the flu, and a respiratory syncytial virus (RSV) infection. The infection can cause a runny nose, cough, sore throat, and fever.  Follow what your doctor tells you about taking medicines, drinking lots of fluid, washing your hands, resting at home, and avoiding people who are sick. This information is not intended to replace advice given to you by your health care provider. Make sure you discuss any questions you have with your health care provider. Document Revised: 12/21/2020 Document Reviewed: 12/21/2020 Elsevier Patient Education  2024 [employer] Upper Respiratory Infection, Adult An upper respiratory infection (URI) is a common viral infection of the nose, throat, and upper air passages that lead to the lungs. The most common type of URI is the common cold. URIs usually get better on their  own, without medical treatment. What are the causes? A URI is caused by a virus. You may catch a virus by: Breathing in droplets from an infected person's cough or sneeze. Touching something that has been exposed to the virus (is contaminated) and then touching your mouth, nose, or eyes. What increases the risk? You are more likely to get a URI if: You are very young or very old. You have close contact with others, such as at work, school, or a health care facility. You smoke. You have long-term (chronic) heart or lung disease. You have a weakened disease-fighting system (immune system). You have nasal allergies or asthma. You are experiencing a lot of stress. You have poor nutrition. What are the signs or symptoms? A URI usually involves some of the following symptoms: Runny or stuffy (congested) nose. Cough. Sneezing. Sore throat. Headache. Fatigue. Fever. Loss of appetite. Pain in your forehead, behind your eyes, and over your cheekbones (sinus pain). Muscle aches. Redness or irritation of the eyes. Pressure in the ears or face. How is this diagnosed? This condition may be diagnosed based on your medical history and symptoms, and a physical exam. Your health care provider may use a swab to take a mucus sample from your nose (nasal swab). This sample can be tested to determine what virus is causing the illness. How is this treated? URIs usually get better on their own within 7-10 days. Medicines cannot cure URIs, but your health care provider may recommend certain medicines to help relieve symptoms, such as: Over-the-counter cold medicines. Cough suppressants. Coughing is a type of defense against infection that helps to clear the respiratory system, so take these medicines only as recommended by your health care provider. Fever-reducing medicines. Follow these instructions at home: Activity Rest as needed. If you have a fever, stay home from work or school until your fever  is gone or until your health care provider says your URI cannot spread to other people (is no longer contagious). Your health care provider may have you wear a face mask to prevent your infection from spreading. Relieving symptoms Gargle with a mixture of salt and water 3-4 times a day or as needed. To make salt water, completely dissolve -1 tsp (3-6 g) of salt in 1 cup (237 mL) of warm water. Use a cool-mist humidifier to add moisture to the air. This can help you breathe more easily. Eating and drinking  Drink enough fluid to keep your urine pale yellow. Eat soups and other clear broths. General instructions  Take over-the-counter and prescription medicines only as told by your health care provider. These include cold medicines, fever reducers, and cough suppressants. Do not use any products that contain nicotine or tobacco. These products include cigarettes, chewing tobacco, and vaping devices, such as e-cigarettes. If you need help quitting, ask your health care provider. Stay away  from secondhand smoke. Stay up to date on all immunizations, including the yearly (annual) flu vaccine. Keep all follow-up visits. This is important. How to prevent the spread of infection to others URIs can be contagious. To prevent the infection from spreading: Wash your hands with soap and water for at least 20 seconds. If soap and water are not available, use hand sanitizer. Avoid touching your mouth, face, eyes, or nose. Cough or sneeze into a tissue or your sleeve or elbow instead of into your hand or into the air.   Contact a health care provider if: You are getting worse instead of better. You have a fever or chills. Your mucus is brown or red. You have yellow or brown discharge coming from your nose. You have pain in your face, especially when you bend forward. You have swollen neck glands. You have pain while swallowing. You have white areas in the back of your throat. Get help right away  if: You have shortness of breath that gets worse. You have severe or persistent: Headache. Ear pain. Sinus pain. Chest pain. You have chronic lung disease along with any of the following: Making high-pitched whistling sounds when you breathe, most often when you breathe out (wheezing). Prolonged cough (more than 14 days). Coughing up blood. A change in your usual mucus. You have a stiff neck. You have changes in your: Vision. Hearing. Thinking. Mood. These symptoms may be an emergency. Get help right away. Call 911. Do not wait to see if the symptoms will go away. Do not drive yourself to the hospital. Summary An upper respiratory infection (URI) is a common infection of the nose, throat, and upper air passages that lead to the lungs. A URI is caused by a virus. URIs usually get better on their own within 7-10 days. Medicines cannot cure URIs, but your health care provider may recommend certain medicines to help relieve symptoms. This information is not intended to replace advice given to you by your health care provider. Make sure you discuss any questions you have with your health care provider. Document Revised: 04/18/2021 Document Reviewed: 04/18/2021 Elsevier Patient Education  2024 [employer] Pharyngitis  Pharyngitis is a sore throat (pharynx). This is when there is redness, pain, and swelling in your throat. Most of the time, this condition gets better on its own. In some cases, you may need medicine. What are the causes? An infection from a virus. An infection from bacteria. Allergies. What increases the risk? Being 66-24 years old. Being in crowded environments. These include: Daycares. Schools. Dormitories. Living in a place with cold temperatures outside. Having a weakened disease-fighting (immune) system. What are the signs or symptoms? Symptoms may vary depending on the cause. Common symptoms include: Sore throat. Tiredness (fatigue). Low-grade  fever. Stuffy nose. Cough. Headache. Other symptoms may include: Glands in the neck (lymph nodes) that are swollen. Skin rashes. Film on the throat or tonsils. This can be caused by an infection from bacteria. Vomiting. Red, itchy eyes. Loss of appetite. Joint pain and muscle aches. Tonsils that are temporarily bigger than usual (enlarged). How is this treated? Many times, treatment is not needed. This condition usually gets better in 3-4 days without treatment. If the infection is caused by a bacteria, you may be need to take antibiotics. Follow these instructions at home: Medicines Take over-the-counter and prescription medicines only as told by your doctor. If you were prescribed an antibiotic medicine, take it as told by your doctor. Do not stop taking the  antibiotic even if you start to feel better. Use throat lozenges or sprays to soothe your throat as told by your doctor. Children can get pharyngitis. Do not give your child aspirin. Managing pain To help with pain, try: Sipping warm liquids, such as: Broth. Herbal tea. Warm water. Eating or drinking cold or frozen liquids, such as frozen ice pops. Rinsing your mouth (gargle) with a salt water mixture 3-4 times a day or as needed. To make salt water, dissolve -1 tsp (3-6 g) of salt in 1 cup (237 mL) of warm water. Do not swallow this mixture. Sucking on hard candy or throat lozenges. Putting a cool-mist humidifier in your bedroom at night to moisten the air. Sitting in the bathroom with the door closed for 5-10 minutes while you run hot water in the shower.   General instructions  Do not smoke or use any products that contain nicotine or tobacco. If you need help quitting, ask your doctor. Rest as told by your doctor. Drink enough fluid to keep your pee (urine) pale yellow. How is this prevented? Wash your hands often for at least 20 seconds with soap and water. If soap and water are not available, use hand  sanitizer. Do not touch your eyes, nose, or mouth with unwashed hands. Wash hands after touching these areas. Do not share cups or eating utensils. Avoid close contact with people who are sick. Contact a doctor if: You have large, tender lumps in your neck. You have a rash. You cough up green, yellow-brown, or bloody spit. Get help right away if: You have a stiff neck. You drool or cannot swallow liquids. You cannot drink or take medicines without vomiting. You have very bad pain that does not go away with medicine. You have problems breathing, and it is not from a stuffy nose. You have new pain and swelling in your knees, ankles, wrists, or elbows. These symptoms may be an emergency. Get help right away. Call your local emergency services (911 in the [place] Do not wait to see if the symptoms will go away. Do not drive yourself to the hospital. Summary Pharyngitis is a sore throat (pharynx). This is when there is redness, pain, and swelling in your throat. Most of the time, pharyngitis gets better on its own. Sometimes, you may need medicine. If you were prescribed an antibiotic medicine, take it as told by your doctor. Do not stop taking the antibiotic even if you start to feel better. This information is not intended to replace advice given to you by your health care provider. Make sure you discuss any questions you have with your health care provider. Document Revised: 12/13/2020 Document Reviewed: 12/13/2020 Elsevier Patient Education  2024 [employer] Cough, Adult A cough helps to clear your throat and lungs. It may be a sign of an illness or another condition. A short-term (acute) cough may last 2-3 weeks. A long-term (chronic) cough may last 8 or more weeks. Many things can cause a cough. They include: Illnesses such as: An infection in your throat or lungs. Asthma or other heart or lung problems. Gastroesophageal reflux. This is when acid comes back up from your  stomach. Breathing in things that bother (irritate) your lungs. Allergies. Postnasal drip. This is when mucus runs down the back of your throat. Smoking. Some medicines. Follow these instructions at home: Medicines Take over-the-counter and prescription medicines only as told by your doctor. Talk with your doctor before you take cough medicine (cough suppressants). Eating and drinking  Do not drink alcohol. Do not drink caffeine. Drink enough fluid to keep your pee (urine) pale yellow. Lifestyle Stay away from cigarette smoke. Do not smoke or use any products that contain nicotine or tobacco. If you need help quitting, ask your doctor. Stay away from things that make you cough. These may include perfume, candles, cleaning products, or campfire smoke. General instructions  Watch for any changes to your cough. Tell your doctor about them. Always cover your mouth when you cough. If the air is dry in your home, use a cool mist vaporizer or humidifier. If your cough is worse at night, try using extra pillows to raise your head up higher while you sleep. Rest as needed. Contact a doctor if: You have new symptoms. Your symptoms get worse. You cough up pus. You have a fever that does not go away. Your cough does not get better after 2-3 weeks. Cough medicine does not help, and you are not sleeping well. You have pain that gets worse or is not helped with medicine. You are losing weight and do not know why. You have night sweats. Get help right away if: You cough up blood. You have trouble breathing. Your heart is beating very fast. These symptoms may be an emergency. Get help right away. Call 911. Do not wait to see if the symptoms will go away. Do not drive yourself to the hospital. This information is not intended to replace advice given to you by your health care provider. Make sure you discuss any questions you have with your health care provider. Document Revised: 05/17/2022  Document Reviewed: 08/18/2  The above assessment and management plan was discussed with the patient. The patient verbalized understanding of and has agreed to the management plan. Patient is aware to call the clinic if they develop any new symptoms or if symptoms persist or worsen. Patient is aware when to return to the clinic for a follow-up visit. Patient educated on when it is appropriate to go to the emergency department.  Jae Skeet [place] Thompson, DNP [facility] [address] [place], [place] [address] [phone]

## 2024-07-15 ENCOUNTER — Ambulatory Visit (HOSPITAL_COMMUNITY): Admitting: Psychiatry

## 2024-07-15 ENCOUNTER — Other Ambulatory Visit: Payer: Self-pay

## 2024-07-15 ENCOUNTER — Encounter (HOSPITAL_COMMUNITY): Payer: Self-pay | Admitting: Psychiatry

## 2024-07-15 VITALS — BP 121/82 | HR 67 | Ht 74.0 in | Wt 272.0 lb

## 2024-07-15 DIAGNOSIS — F4010 Social phobia, unspecified: Secondary | ICD-10-CM | POA: Diagnosis not present

## 2024-07-15 DIAGNOSIS — F331 Major depressive disorder, recurrent, moderate: Secondary | ICD-10-CM

## 2024-07-15 DIAGNOSIS — F410 Panic disorder [episodic paroxysmal anxiety] without agoraphobia: Secondary | ICD-10-CM

## 2024-07-15 MED ORDER — LORAZEPAM 0.5 MG PO TABS: 0.5000 mg | ORAL_TABLET | Freq: Three times a day (TID) | ORAL | 1 refills | Status: DC | PRN

## 2024-07-15 MED ORDER — BUPROPION HCL ER (XL) 150 MG PO TB24: 150.0000 mg | ORAL_TABLET | Freq: Every day | ORAL | 1 refills | Status: DC

## 2024-07-15 MED ORDER — TRAZODONE HCL 100 MG PO TABS: 100.0000 mg | ORAL_TABLET | Freq: Every evening | ORAL | 1 refills | Status: DC | PRN

## 2024-07-15 MED ORDER — CLOMIPRAMINE HCL 50 MG PO CAPS: 50.0000 mg | ORAL_CAPSULE | Freq: Every day | ORAL | 1 refills | Status: DC

## 2024-07-15 NOTE — Progress Notes (Signed)
 [facility] MD/PA/NP OP Progress Note  Patient location; office Provider location; office   07/15/2024 3:52 PM Paul Parrish  MRN:  [id number]  Chief Complaint:  Chief Complaint  Patient presents with   Anxiety   Medication Refill   Headache   HPI: Patient came to the office for his follow-up appointment.  He had a good summer.  He did go to beach with the family and had a good time.  He is taking Anafranil  only 50 mg as he could not tolerate more than 1 pill.  He is taking Ativan  most of the time 2 tablet and very rarely he needs 3 pill when he is in the public and get very anxious.  He still struggle with lack of motivation, desire to do things.  He feels decreased energy and procrastination.  He admitted withdrawn, socially isolated but denies any agitation, paranoia, hallucination.  We have recommended for TMS but he could not do TMS due to travel distance.  He feels sometimes dysphoria and hopelessness but no suicidal thoughts or homicidal thoughts.  Today he brought a list of medication that he took in 2018-2019 and thought some of the medication worked for him.  These medicines were Wellbutrin , Adderall.  In recent months we have tried Tranxene , Rexulti  but did not work.  He had tried various other medication including ECT but limited response.  He takes propranolol  which helps his nervousness but does not take it every day.  He is on metoprolol  prescribed by PCP.  His appetite is okay.  His weight is unchanged from the past.  He is on trazodone , Ativan  beside Anafranil .  He has no tremor or shakes or any EPS.  Visit Diagnosis:    ICD-10-CM   1. Moderate episode of recurrent major depressive disorder (HCC)  F33.1 clomiPRAMINE  (ANAFRANIL ) 50 MG capsule    LORazepam  (ATIVAN ) 0.5 MG tablet    buPROPion  (WELLBUTRIN  XL) 150 MG 24 hr tablet    2. Social anxiety disorder  F40.10 clomiPRAMINE  (ANAFRANIL ) 50 MG capsule    LORazepam  (ATIVAN ) 0.5 MG tablet    traZODone  (DESYREL ) 100 MG tablet     buPROPion  (WELLBUTRIN  XL) 150 MG 24 hr tablet    3. Panic attacks  F41.0 clomiPRAMINE  (ANAFRANIL ) 50 MG capsule    LORazepam  (ATIVAN ) 0.5 MG tablet    buPROPion  (WELLBUTRIN  XL) 150 MG 24 hr tablet         Past Psychiatric History: Reviewed H/O anxiety and depression. Saw provider at neuropsychiatry in [place] but not happy with the treatment.  Saw Dr. Leila in 2018 and given Prozac , Xanax , Effexor , Celexa , propanolol, Lamictal , Vistaril , Adderall, Wellbutrin ,  Cymbalta  (increased irritability ), Anafranil , Pristiq , Trintellix  and Luvox  (did not help). H/O of ECT with no response.  Had genetic testing in 2019.  Paxil, Lexapro, Celexa  and Tegretol had significant drug interaction.  No h/o suicidal attempt or inpatient.  At [facility] tried Viibryd , Klonopin  but did not work. H/O heavy drinking but claims to be sober.     Past Medical History:  Past Medical History:  Diagnosis Date   Anxiety    on meds   Depression    on meds   Elevated hemoglobin    Low testosterone     Sleep apnea    uses CPAP   Small bowel obstruction (HCC) 10/21/2016    Past Surgical History:  Procedure Laterality Date   COLONOSCOPY  08/17/2021   LUMBAR SPINE SURGERY  12/2020   NASAL SINUS SURGERY  02/2016   WISDOM  TOOTH EXTRACTION      Family Psychiatric History: Reviewed  Family History:  Family History  Problem Relation Age of Onset   Kidney cancer Mother        mets from LEFT hip   Liver cancer Mother        mets from LEFT hip   Diverticulitis Mother    Hypertension Father    Diabetes Father    Diabetes Brother    Cancer Paternal Grandmother     Social History:  Social History   Socioeconomic History   Marital status: Married    Spouse name: Not on file   Number of children: Not on file   Years of education: Not on file   Highest education level: Not on file  Occupational History   Not on file  Tobacco Use   Smoking status: Never   Smokeless tobacco: Never  Vaping Use    Vaping status: Never Used  Substance and Sexual Activity   Alcohol use: Not Currently    Comment: 10/22/2016 nothing in over 1 year   Drug use: No   Sexual activity: Yes    Partners: Female    Birth control/protection: None  Other Topics Concern   Not on file  Social History Narrative   Not on file   Social Drivers of Health   Financial Resource Strain: Not on file  Food Insecurity: Not on file  Transportation Needs: Not on file  Physical Activity: Not on file  Stress: Not on file  Social Connections: Not on file    Allergies:  Allergies  Allergen Reactions   Amoxicillin Other (See Comments)    High fever and possible heart racing   Bactrim [Sulfamethoxazole-Trimethoprim] Other (See Comments)    Fever, body aches, chills   Penicillins Other (See Comments)    High fever Has patient had a PCN reaction causing immediate rash, facial/tongue/throat swelling, SOB or lightheadedness with hypotension: No Has patient had a PCN reaction causing severe rash involving mucus membranes or skin necrosis: No Has patient had a PCN reaction that required hospitalization No Has patient had a PCN reaction occurring within the last 10 years: No If all of the above answers are NO, then may proceed with Cephalosporin use.     Metabolic Disorder Labs: Lab Results  Component Value Date   HGBA1C 5.4 08/15/2023   Lab Results  Component Value Date   PROLACTIN 12.4 10/09/2017   PROLACTIN 14.5 09/08/2015   Lab Results  Component Value Date   CHOL 130 02/06/2024   TRIG 163 (H) 02/06/2024   HDL 30 (L) 02/06/2024   CHOLHDL 4.3 02/06/2024   LDLCALC 72 02/06/2024   LDLCALC 141 (H) 08/15/2023   Lab Results  Component Value Date   TSH 2.143 10/13/2023   TSH 3.890 08/16/2022    Therapeutic Level Labs: No results found for: LITHIUM No results found for: VALPROATE No results found for: CBMZ  Current Medications: Current Outpatient Medications  Medication Sig Dispense Refill    azelastine  (ASTELIN ) 0.1 % nasal spray Place 1 spray into both nostrils 2 (two) times daily. Use in each nostril as directed 30 mL 12   clomiPRAMINE  (ANAFRANIL ) 50 MG capsule Take 1 capsule (50 mg total) by mouth 2 (two) times daily. 60 capsule 2   fenofibrate  (TRICOR ) 48 MG tablet Take 1 tablet (48 mg total) by mouth daily. 90 tablet 3   guaifenesin  (HUMIBID E) 400 MG TABS tablet Take 1 tablet (400 mg total) by mouth every 6 (six) hours  as needed. 30 tablet 0   LORazepam  (ATIVAN ) 0.5 MG tablet Take 1 tablet (0.5 mg total) by mouth 3 (three) times daily as needed for anxiety. 75 tablet 2   Melatonin 10 MG TABS Take 1 tablet by mouth at bedtime.     metoprolol  tartrate (LOPRESSOR ) 50 MG tablet Take 1 tablet (50 mg total) by mouth 2 (two) times daily. 180 tablet 3   Multiple Vitamin (MULTI-VITAMIN DAILY PO) Take 1 capsule by mouth 2 (two) times daily. Name: Life Extensions     Omega-3 Fatty Acids (FISH OIL) 1000 MG CAPS Take 1 capsule by mouth 2 (two) times daily.     propranolol  (INDERAL ) 20 MG tablet Take 1 tablet (20 mg total) by mouth 4 (four) times daily. Only as needed for tachycardia/palpitations (Patient taking differently: Take 20 mg by mouth as needed (For fast heartrate). Only as needed for tachycardia/palpitations) 120 tablet 3   Risankizumab -rzaa (SKYRIZI  PEN) 150 MG/ML SOAJ Inject 150 mg into the skin as directed. Every 12 weeks for maintenance. 1 mL 6   rosuvastatin  (CRESTOR ) 10 MG tablet Take 1 tablet (10 mg total) by mouth daily. 90 tablet 3   sildenafil  (REVATIO ) 20 MG tablet TAKE 1 TABLET BY MOUTH ONCE DAILY AS NEEDED 60 tablet 0   SKYRIZI  150 MG/ML SOSY INJECT 150MG  SUBCUTANEOUSLY EVERY 12 WEEKS AS DIRECTED. 300 mL 10   SYRINGE-NEEDLE, DISP, 3 ML 21G X 1-1/2 3 ML MISC Use to inject testosterone  every week 50 each 1   Testosterone  20.25 MG/ACT (1.62%) GEL Apply 20.25mg /ACT on one shoulder every morning (alternate shoulders every day). 75 g 0   traZODone  (DESYREL ) 100 MG tablet  Take 1 tablet (100 mg total) by mouth at bedtime as needed for sleep. 30 tablet 2   No current facility-administered medications for this visit.     Musculoskeletal: Strength & Muscle Tone: within normal limits Gait & Station: normal Patient leans: N/A  Psychiatric Specialty Exam: Review of Systems  Blood pressure 121/82, pulse 67, height 6' 2 (1.88 m), weight 272 lb (123.4 kg).Body mass index is 34.92 kg/m.  General Appearance: Casual  Eye Contact:  Fair  Speech:  Slow  Volume:  Decreased  Mood:  Dysphoric  Affect:  Constricted and Restricted  Thought Process:  Goal Directed  Orientation:  Full (Time, Place, and Person)  Thought Content: Rumination   Suicidal Thoughts:  No  Homicidal Thoughts:  No  Memory:  Immediate;   Good Recent;   Good Remote;   Fair  Judgement:  Fair  Insight:  Present  Psychomotor Activity:  Decreased  Concentration:  Concentration: Fair and Attention Span: Fair  Recall:  [employer] of Knowledge: Good  Language: Good  Akathisia:  No  Handed:  Right  AIMS (if indicated): not done  Assets:  Communication Skills Desire for Improvement Housing  ADL's:  Intact  Cognition: WNL  Sleep:  Okay with trazodone  and CPAP   Screenings: GAD-7    Flowsheet Row Office Visit from 02/19/2024 in [facility] Office Visit from 12/11/2023 in [facility] Office Visit from 08/21/2023 in [facility] Office Visit from 02/20/2023 in [facility] Office Visit from 08/15/2022 in [facility]  Total GAD-7 Score 10 15 12 11 8    PHQ2-9    Flowsheet Row Office Visit from 02/19/2024 in [facility] Office Visit from 12/11/2023 in [facility] Office Visit from 08/21/2023 in [facility]  Visit from 02/20/2023 in [facility] Office Visit from 08/15/2022 in [facility]  PHQ-2 Total Score 2 2 4 2 4   PHQ-9 Total Score 8 10 12 11 12    Flowsheet Row Office Visit from 12/11/2023 [facility] from 10/13/2023 in [facility] Video Visit from 04/04/2021 in [facility]  C-SSRS RISK CATEGORY Error: Q3, 4, or 5 should not be populated when Q2 is No No Risk No Risk     Assessment and Plan: Patient is a 53 year old man with history of hypertension, tachycardia, hypergonadism, major depressive disorder, anxiety, hyperlipidemia and panic attacks.  Reviewed [employer] testing.  Wellbutrin  is a favorable choice however he had tried in the past by Dr. Hershal.  He like to go back on it because he recall it may had helped him and give him more motivation.  He also taking only 1 pill of Anafranil  for just 50 mg.  He decided not to pursue TMS due to travel distance.  Discussed medication choices and will try Wellbutrin  XL 150 mg again and I recommend to take in the morning.  I also recommend cut down the trazodone  as taking too many medication that may cause side effects.  He promised to cut down the trazodone  and take only on the weekends if needed.  He will keep the Anafranil  50 mg daily, Ativan  0.5 mg take up to 3 times a day as needed.  He is no longer on Rexulti .  Recommend to call back if is any question or any concern.  Follow-up in 2 months    Collaboration of Care: Collaboration of Care: Other provider involved in patient's care AEB notes are available in epic to review  Patient/Guardian was advised Release of Information must be obtained prior to any record release in order to collaborate their care with an outside provider. Patient/Guardian was advised if they have not already done so to contact the  registration department to sign all necessary forms in order for us  to release information regarding their care.   Consent: Patient/Guardian gives verbal consent for treatment and assignment of benefits for services provided during this visit. Patient/Guardian expressed understanding and agreed to proceed.   I provided 32 minutes face-to-face time during this encounter.  Leni ONEIDA Client, MD 07/15/2024, 3:52 PM

## 2024-07-19 ENCOUNTER — Other Ambulatory Visit: Payer: Self-pay | Admitting: Family Medicine

## 2024-08-06 ENCOUNTER — Encounter: Payer: Self-pay | Admitting: Family Medicine

## 2024-08-06 DIAGNOSIS — R5383 Other fatigue: Secondary | ICD-10-CM

## 2024-08-06 DIAGNOSIS — R7303 Prediabetes: Secondary | ICD-10-CM

## 2024-08-06 DIAGNOSIS — Z125 Encounter for screening for malignant neoplasm of prostate: Secondary | ICD-10-CM

## 2024-08-06 DIAGNOSIS — E781 Pure hyperglyceridemia: Secondary | ICD-10-CM

## 2024-08-06 DIAGNOSIS — E782 Mixed hyperlipidemia: Secondary | ICD-10-CM

## 2024-08-06 DIAGNOSIS — I10 Essential (primary) hypertension: Secondary | ICD-10-CM

## 2024-08-09 ENCOUNTER — Other Ambulatory Visit: Payer: Self-pay | Admitting: "Endocrinology

## 2024-08-13 ENCOUNTER — Other Ambulatory Visit

## 2024-08-13 DIAGNOSIS — R5383 Other fatigue: Secondary | ICD-10-CM

## 2024-08-13 DIAGNOSIS — E781 Pure hyperglyceridemia: Secondary | ICD-10-CM

## 2024-08-13 DIAGNOSIS — Z125 Encounter for screening for malignant neoplasm of prostate: Secondary | ICD-10-CM

## 2024-08-13 DIAGNOSIS — I10 Essential (primary) hypertension: Secondary | ICD-10-CM

## 2024-08-13 DIAGNOSIS — R7303 Prediabetes: Secondary | ICD-10-CM

## 2024-08-13 DIAGNOSIS — E782 Mixed hyperlipidemia: Secondary | ICD-10-CM

## 2024-08-13 LAB — BAYER DCA HB A1C WAIVED: HB A1C (BAYER DCA - WAIVED): 5.7 % — ABNORMAL HIGH (ref 4.8–5.6)

## 2024-08-14 LAB — CBC WITH DIFFERENTIAL/PLATELET
Basophils Absolute: 0.1 x10E3/uL (ref 0.0–0.2)
Basos: 1 %
EOS (ABSOLUTE): 0.2 x10E3/uL (ref 0.0–0.4)
Eos: 2 %
Hematocrit: 53.2 % — ABNORMAL HIGH (ref 37.5–51.0)
Hemoglobin: 17.8 g/dL — ABNORMAL HIGH (ref 13.0–17.7)
Immature Grans (Abs): 0 x10E3/uL (ref 0.0–0.1)
Immature Granulocytes: 0 %
Lymphocytes Absolute: 3 x10E3/uL (ref 0.7–3.1)
Lymphs: 45 %
MCH: 30.7 pg (ref 26.6–33.0)
MCHC: 33.5 g/dL (ref 31.5–35.7)
MCV: 92 fL (ref 79–97)
Monocytes Absolute: 0.6 x10E3/uL (ref 0.1–0.9)
Monocytes: 9 %
Neutrophils Absolute: 3 x10E3/uL (ref 1.4–7.0)
Neutrophils: 43 %
Platelets: 269 x10E3/uL (ref 150–450)
RBC: 5.8 x10E6/uL (ref 4.14–5.80)
RDW: 13.3 % (ref 11.6–15.4)
WBC: 6.9 x10E3/uL (ref 3.4–10.8)

## 2024-08-14 LAB — CMP14+EGFR
ALT: 32 IU/L (ref 0–44)
AST: 27 IU/L (ref 0–40)
Albumin: 4.9 g/dL (ref 3.8–4.9)
Alkaline Phosphatase: 56 IU/L (ref 47–123)
BUN/Creatinine Ratio: 9 (ref 9–20)
BUN: 12 mg/dL (ref 6–24)
Bilirubin Total: 0.5 mg/dL (ref 0.0–1.2)
CO2: 23 mmol/L (ref 20–29)
Calcium: 9.7 mg/dL (ref 8.7–10.2)
Chloride: 99 mmol/L (ref 96–106)
Creatinine, Ser: 1.37 mg/dL — ABNORMAL HIGH (ref 0.76–1.27)
Globulin, Total: 2.8 g/dL (ref 1.5–4.5)
Glucose: 87 mg/dL (ref 70–99)
Potassium: 4.4 mmol/L (ref 3.5–5.2)
Sodium: 138 mmol/L (ref 134–144)
Total Protein: 7.7 g/dL (ref 6.0–8.5)
eGFR: 62 mL/min/1.73 (ref >59)

## 2024-08-14 LAB — TESTOSTERONE,FREE AND TOTAL
Testosterone, Free: 10.6 pg/mL (ref 7.2–24.0)
Testosterone: 425 ng/dL (ref 264–916)

## 2024-08-14 LAB — LIPID PANEL
Chol/HDL Ratio: 4.5 ratio (ref 0.0–5.0)
Cholesterol, Total: 148 mg/dL (ref 100–199)
HDL: 33 mg/dL — ABNORMAL LOW (ref >39)
LDL Chol Calc (NIH): 84 mg/dL (ref 0–99)
Triglycerides: 177 mg/dL — ABNORMAL HIGH (ref 0–149)
VLDL Cholesterol Cal: 31 mg/dL (ref 5–40)

## 2024-08-14 LAB — PSA, TOTAL AND FREE
PSA, Free Pct: 12.2 %
PSA, Free: 0.11 ng/mL
Prostate Specific Ag, Serum: 0.9 ng/mL (ref 0.0–4.0)

## 2024-08-19 ENCOUNTER — Ambulatory Visit: Admitting: Family Medicine

## 2024-08-19 ENCOUNTER — Ambulatory Visit: Payer: Self-pay

## 2024-08-19 ENCOUNTER — Telehealth: Payer: Self-pay | Admitting: Family Medicine

## 2024-08-19 ENCOUNTER — Encounter: Payer: Self-pay | Admitting: Family Medicine

## 2024-08-19 ENCOUNTER — Ambulatory Visit: Payer: Self-pay | Admitting: Family Medicine

## 2024-08-19 VITALS — BP 113/77 | HR 72 | Ht 74.0 in | Wt 271.0 lb

## 2024-08-19 DIAGNOSIS — E781 Pure hyperglyceridemia: Secondary | ICD-10-CM | POA: Diagnosis not present

## 2024-08-19 DIAGNOSIS — E782 Mixed hyperlipidemia: Secondary | ICD-10-CM | POA: Diagnosis not present

## 2024-08-19 DIAGNOSIS — R7303 Prediabetes: Secondary | ICD-10-CM | POA: Diagnosis not present

## 2024-08-19 DIAGNOSIS — I10 Essential (primary) hypertension: Secondary | ICD-10-CM | POA: Diagnosis not present

## 2024-08-19 DIAGNOSIS — M549 Dorsalgia, unspecified: Secondary | ICD-10-CM

## 2024-08-19 MED ORDER — FENOFIBRATE 48 MG PO TABS: 48.0000 mg | ORAL_TABLET | Freq: Every day | ORAL | 3 refills | Status: AC

## 2024-08-19 MED ORDER — ROSUVASTATIN CALCIUM 10 MG PO TABS: 10.0000 mg | ORAL_TABLET | Freq: Every day | ORAL | 3 refills | Status: AC

## 2024-08-19 NOTE — Telephone Encounter (Signed)
 Pt has CPE 02/16/2025 and lab appt on 02/09/2025  Please place lab orders

## 2024-08-19 NOTE — Telephone Encounter (Signed)
 Future lab orders placed.

## 2024-08-19 NOTE — Progress Notes (Signed)
 BP 113/77   Pulse 72   Ht 6' 2 (1.88 m)   Wt 271 lb (122.9 kg)   SpO2 100%   BMI 34.79 kg/m    Subjective:   Patient ID: Paul Parrish, male    DOB: [birth year], 53 y.o.   MRN: [id number]  HPI: Paul Parrish is a 53 y.o. male presenting on 08/19/2024 for Medical Management of Chronic Issues and Hypertension   Discussed the use of AI scribe software for clinical note transcription with the patient, who gave verbal consent to proceed.  History of Present Illness   Paul Parrish is a 53 year old male who presents for a recheck of his health status.  Cephalic pulsatile sensation - Experiences a sensation of rushing through his head several times per week when lying down at night - Describes the sensation as feeling like his heartbeat is in his head - No associated migraines - No hearing problems  Hypoglycemic episodes - Episodes of hypoglycemia with blood glucose dropping to approximately 60 mg/dL - Symptoms include shakiness and confusion - Episodes occur despite regular meals, including after eating biscuits at [employer] - Symptoms have been present intermittently over the past few years  Sinus and abdominal pain - Dull pain in the sinus area radiating to the stomach - Pain lasts approximately ten seconds before resolving - Pain is not sharp but is noticeable - No associated bowel issues  Hypertriglyceridemia management - Currently taking fish oil capsules for cholesterol management (four capsules daily: three in the morning, two at night) - Triglyceride levels have improved from over 300 mg/dL to approximately 829 mg/dL  Psoriasis and testosterone  therapy - Under dermatology care for psoriasis, managed with Skyrizi  - Receives testosterone  therapy under endocrinology care - Testosterone  regimen switched from injections to gel due to elevated hemoglobin levels - Undergoes blood draws every eight weeks to monitor hemoglobin          Relevant past medical, surgical,  family and social history reviewed and updated as indicated. Interim medical history since our last visit reviewed. Allergies and medications reviewed and updated.  Review of Systems  Constitutional:  Negative for chills and fever.  Eyes:  Negative for visual disturbance.  Respiratory:  Negative for shortness of breath and wheezing.   Cardiovascular:  Negative for chest pain and leg swelling.  Musculoskeletal:  Positive for arthralgias and back pain. Negative for gait problem.  Skin:  Negative for rash.  Neurological:  Negative for dizziness, weakness and light-headedness.  All other systems reviewed and are negative.   Per HPI unless specifically indicated above   Allergies as of 08/19/2024       Reactions   Amoxicillin Other (See Comments)   High fever and possible heart racing   Bactrim [sulfamethoxazole-trimethoprim] Other (See Comments)   Fever, body aches, chills   Penicillins Other (See Comments)   High fever Has patient had a PCN reaction causing immediate rash, facial/tongue/throat swelling, SOB or lightheadedness with hypotension: No Has patient had a PCN reaction causing severe rash involving mucus membranes or skin necrosis: No Has patient had a PCN reaction that required hospitalization No Has patient had a PCN reaction occurring within the last 10 years: No If all of the above answers are NO, then may proceed with Cephalosporin use.        Medication List        Accurate as of August 19, 2024  4:05 PM. If you have any questions, ask your nurse or doctor.  STOP taking these medications    B-D 3CC LUER-LOK SYR 21GX1-1/2 21G X 1-1/2 3 ML Misc Generic drug: SYRINGE-NEEDLE (DISP) 3 ML Stopped by: Fonda LABOR Karam Dunson       TAKE these medications    azelastine  0.1 % nasal spray Commonly known as: ASTELIN  Place 1 spray into both nostrils 2 (two) times daily. Use in each nostril as directed   buPROPion  150 MG 24 hr tablet Commonly known  as: Wellbutrin  XL Take 1 tablet (150 mg total) by mouth daily.   clomiPRAMINE  50 MG capsule Commonly known as: ANAFRANIL  Take 1 capsule (50 mg total) by mouth at bedtime.   fenofibrate  48 MG tablet Commonly known as: Tricor  Take 1 tablet (48 mg total) by mouth daily.   Fish Oil 1000 MG Caps Take 1 capsule by mouth 2 (two) times daily.   guaifenesin  400 MG Tabs tablet Commonly known as: HUMIBID E Take 1 tablet (400 mg total) by mouth every 6 (six) hours as needed.   LORazepam  0.5 MG tablet Commonly known as: Ativan  Take 1 tablet (0.5 mg total) by mouth 3 (three) times daily as needed for anxiety.   Melatonin 10 MG Tabs Take 1 tablet by mouth at bedtime.   metoprolol  tartrate 50 MG tablet Commonly known as: LOPRESSOR  Take 1 tablet (50 mg total) by mouth 2 (two) times daily.   MULTI-VITAMIN DAILY PO Take 1 capsule by mouth 2 (two) times daily. Name: Life Extensions   propranolol  20 MG tablet Commonly known as: INDERAL  Take 1 tablet (20 mg total) by mouth 4 (four) times daily. Only as needed for tachycardia/palpitations What changed:  when to take this reasons to take this   rosuvastatin  10 MG tablet Commonly known as: Crestor  Take 1 tablet (10 mg total) by mouth daily.   sildenafil  20 MG tablet Commonly known as: REVATIO  TAKE 1 TABLET BY MOUTH ONCE DAILY AS NEEDED   Skyrizi  150 MG/ML Sosy prefilled syringe Generic drug: risankizumab -rzaa INJECT 150MG  SUBCUTANEOUSLY EVERY 12 WEEKS AS DIRECTED.   Skyrizi  Pen 150 MG/ML pen Generic drug: risankizumab -rzaa Inject 150 mg into the skin as directed. Every 12 weeks for maintenance.   Testosterone  20.25 MG/ACT (1.62%) Gel Apply 20.25mg /ACT on one shoulder every morning (alternate shoulders every day).   traZODone  100 MG tablet Commonly known as: DESYREL  Take 1 tablet (100 mg total) by mouth at bedtime as needed for sleep.         Objective:   BP 113/77   Pulse 72   Ht 6' 2 (1.88 m)   Wt 271 lb (122.9 kg)    SpO2 100%   BMI 34.79 kg/m   Wt Readings from Last 3 Encounters:  08/19/24 271 lb (122.9 kg)  06/23/24 271 lb (122.9 kg)  06/22/24 272 lb 3.2 oz (123.5 kg)    Physical Exam Vitals and nursing note reviewed.  Constitutional:      General: He is not in acute distress.    Appearance: He is well-developed. He is not diaphoretic.  Eyes:     General: No scleral icterus.       Right eye: No discharge.     Conjunctiva/sclera: Conjunctivae normal.     Pupils: Pupils are equal, round, and reactive to light.  Neck:     Thyroid : No thyromegaly.  Cardiovascular:     Rate and Rhythm: Normal rate and regular rhythm.     Heart sounds: Normal heart sounds. No murmur heard. Pulmonary:     Effort: Pulmonary effort is normal. No respiratory  distress.     Breath sounds: Normal breath sounds. No wheezing.  Musculoskeletal:        General: Normal range of motion.     Cervical back: Neck supple.       Back:  Lymphadenopathy:     Cervical: No cervical adenopathy.  Skin:    General: Skin is warm and dry.     Findings: No rash.  Neurological:     Mental Status: He is alert and oriented to person, place, and time.     Coordination: Coordination normal.  Psychiatric:        Behavior: Behavior normal.    Physical Exam   HEENT: Ears normal. CHEST: Lungs clear to auscultation bilaterally. CARDIOVASCULAR: Heart sounds normal.       Results for orders placed or performed in visit on 08/13/24  Bayer DCA Hb A1c Waived   Collection Time: 08/13/24  9:08 AM  Result Value Ref Range   HB A1C (BAYER DCA - WAIVED) 5.7 (H) 4.8 - 5.6 %  Testosterone ,Free and Total   Collection Time: 08/13/24  9:10 AM  Result Value Ref Range   Testosterone  425 264 - 916 ng/dL   Testosterone , Free 10.6 7.2 - 24.0 pg/mL  CMP14+EGFR   Collection Time: 08/13/24  9:10 AM  Result Value Ref Range   Glucose 87 70 - 99 mg/dL   BUN 12 6 - 24 mg/dL   Creatinine, Ser 8.62 (H) 0.76 - 1.27 mg/dL   eGFR 62 >40 fO/fpw/8.26    BUN/Creatinine Ratio 9 9 - 20   Sodium 138 134 - 144 mmol/L   Potassium 4.4 3.5 - 5.2 mmol/L   Chloride 99 96 - 106 mmol/L   CO2 23 20 - 29 mmol/L   Calcium  9.7 8.7 - 10.2 mg/dL   Total Protein 7.7 6.0 - 8.5 g/dL   Albumin 4.9 3.8 - 4.9 g/dL   Globulin, Total 2.8 1.5 - 4.5 g/dL   Bilirubin Total 0.5 0.0 - 1.2 mg/dL   Alkaline Phosphatase 56 47 - 123 IU/L   AST 27 0 - 40 IU/L   ALT 32 0 - 44 IU/L  Lipid panel   Collection Time: 08/13/24  9:10 AM  Result Value Ref Range   Cholesterol, Total 148 100 - 199 mg/dL   Triglycerides 822 (H) 0 - 149 mg/dL   HDL 33 (L) >60 mg/dL   VLDL Cholesterol Cal 31 5 - 40 mg/dL   LDL Chol Calc (NIH) 84 0 - 99 mg/dL   Chol/HDL Ratio 4.5 0.0 - 5.0 ratio  CBC with Differential/Platelet   Collection Time: 08/13/24  9:10 AM  Result Value Ref Range   WBC 6.9 3.4 - 10.8 x10E3/uL   RBC 5.80 4.14 - 5.80 x10E6/uL   Hemoglobin 17.8 (H) 13.0 - 17.7 g/dL   Hematocrit 46.7 (H) 62.4 - 51.0 %   MCV 92 79 - 97 fL   MCH 30.7 26.6 - 33.0 pg   MCHC 33.5 31.5 - 35.7 g/dL   RDW 86.6 88.3 - 84.5 %   Platelets 269 150 - 450 x10E3/uL   Neutrophils 43 Not Estab. %   Lymphs 45 Not Estab. %   Monocytes 9 Not Estab. %   Eos 2 Not Estab. %   Basos 1 Not Estab. %   Neutrophils Absolute 3.0 1.4 - 7.0 x10E3/uL   Lymphocytes Absolute 3.0 0.7 - 3.1 x10E3/uL   Monocytes Absolute 0.6 0.1 - 0.9 x10E3/uL   EOS (ABSOLUTE) 0.2 0.0 - 0.4 x10E3/uL  Basophils Absolute 0.1 0.0 - 0.2 x10E3/uL   Immature Granulocytes 0 Not Estab. %   Immature Grans (Abs) 0.0 0.0 - 0.1 x10E3/uL  PSA, total and free   Collection Time: 08/13/24  9:10 AM  Result Value Ref Range   Prostate Specific Ag, Serum 0.9 0.0 - 4.0 ng/mL   PSA, Free 0.11 N/A ng/mL   PSA, Free Pct 12.2 %    Assessment & Plan:   Problem List Items Addressed This Visit       Cardiovascular and Mediastinum   Hypertension - Primary   Relevant Medications   fenofibrate  (TRICOR ) 48 MG tablet   rosuvastatin  (CRESTOR ) 10 MG  tablet     Other   Hypertriglyceridemia   Relevant Medications   fenofibrate  (TRICOR ) 48 MG tablet   rosuvastatin  (CRESTOR ) 10 MG tablet   Prediabetes   Hyperlipidemia   Relevant Medications   fenofibrate  (TRICOR ) 48 MG tablet   rosuvastatin  (CRESTOR ) 10 MG tablet   Other Visit Diagnoses       Mid back pain on right side              Essential hypertension Blood pressure is well-controlled with current management.  Mixed hyperlipidemia and hypertriglyceridemia LDL cholesterol is well-controlled, and triglycerides have improved significantly with fish oil supplementation. - Continue fish oil supplementation, 3 in the morning and 2 at night.  Prediabetes with possible hypoglycemic episodes A1c is 5.7, indicating prediabetes. Reports episodes of hypoglycemia with blood sugar dropping to 60, likely due to rebound hypoglycemia after high carbohydrate intake. - Incorporate protein with meals to prevent hypoglycemic episodes.  Chronic kidney disease, unspecified Kidney function is slightly elevated but stable. Current creatinine levels are close to normal. - Increase water intake to maintain hydration.  Possible thoracic radiculopathy Intermittent dull pain in the sinus area radiating to the stomach, possibly related to thoracic radiculopathy. No bowel issues reported. Pain is brief and not severe. - Perform back stretches and exercises regularly. - Monitor frequency and duration of pain.  General Health Maintenance PSA level is 0.9, within normal range. Testosterone  levels are stable after switching to gel form due to high hemoglobin levels. - Continue testosterone  gel therapy. - Continue regular blood work every eight weeks.          Follow up plan: Return in about 6 months (around 02/16/2025), or if symptoms worsen or fail to improve, for Physical exam and hyperlipidemia recheck.  Counseling provided for all of the vaccine components No orders of the defined types were  placed in this encounter.   Fonda Levins, MD [facility] 08/19/2024, 4:05 PM

## 2024-08-23 ENCOUNTER — Other Ambulatory Visit: Payer: Self-pay | Admitting: "Endocrinology

## 2024-08-25 ENCOUNTER — Other Ambulatory Visit: Payer: Self-pay | Admitting: "Endocrinology

## 2024-08-25 MED ORDER — TESTOSTERONE 20.25 MG/ACT (1.62%) TD GEL: TRANSDERMAL | 0 refills | Status: DC

## 2024-09-02 ENCOUNTER — Ambulatory Visit (HOSPITAL_COMMUNITY): Admitting: Psychiatry

## 2024-09-07 ENCOUNTER — Other Ambulatory Visit (HOSPITAL_COMMUNITY): Payer: Self-pay | Admitting: Psychiatry

## 2024-09-07 DIAGNOSIS — F410 Panic disorder [episodic paroxysmal anxiety] without agoraphobia: Secondary | ICD-10-CM

## 2024-09-07 DIAGNOSIS — F4010 Social phobia, unspecified: Secondary | ICD-10-CM

## 2024-09-07 DIAGNOSIS — F331 Major depressive disorder, recurrent, moderate: Secondary | ICD-10-CM

## 2024-09-13 ENCOUNTER — Other Ambulatory Visit (HOSPITAL_COMMUNITY): Payer: Self-pay | Admitting: *Deleted

## 2024-09-13 DIAGNOSIS — F331 Major depressive disorder, recurrent, moderate: Secondary | ICD-10-CM

## 2024-09-13 DIAGNOSIS — F410 Panic disorder [episodic paroxysmal anxiety] without agoraphobia: Secondary | ICD-10-CM

## 2024-09-13 DIAGNOSIS — F4010 Social phobia, unspecified: Secondary | ICD-10-CM

## 2024-09-13 MED ORDER — CLOMIPRAMINE HCL 50 MG PO CAPS: 50.0000 mg | ORAL_CAPSULE | Freq: Every day | ORAL | 0 refills | Status: DC

## 2024-09-13 MED ORDER — BUPROPION HCL ER (XL) 150 MG PO TB24: 150.0000 mg | ORAL_TABLET | Freq: Every day | ORAL | 0 refills | Status: DC

## 2024-09-20 ENCOUNTER — Other Ambulatory Visit (HOSPITAL_COMMUNITY): Payer: Self-pay | Admitting: *Deleted

## 2024-09-20 DIAGNOSIS — F331 Major depressive disorder, recurrent, moderate: Secondary | ICD-10-CM

## 2024-09-20 DIAGNOSIS — F4010 Social phobia, unspecified: Secondary | ICD-10-CM

## 2024-09-20 DIAGNOSIS — F410 Panic disorder [episodic paroxysmal anxiety] without agoraphobia: Secondary | ICD-10-CM

## 2024-09-20 MED ORDER — LORAZEPAM 0.5 MG PO TABS: 0.5000 mg | ORAL_TABLET | Freq: Three times a day (TID) | ORAL | 0 refills | Status: DC | PRN

## 2024-10-11 ENCOUNTER — Telehealth (HOSPITAL_COMMUNITY): Admitting: Psychiatry

## 2024-10-11 ENCOUNTER — Other Ambulatory Visit

## 2024-10-11 ENCOUNTER — Encounter (HOSPITAL_COMMUNITY): Payer: Self-pay | Admitting: Psychiatry

## 2024-10-11 VITALS — Wt 271.0 lb

## 2024-10-11 DIAGNOSIS — F410 Panic disorder [episodic paroxysmal anxiety] without agoraphobia: Secondary | ICD-10-CM | POA: Diagnosis not present

## 2024-10-11 DIAGNOSIS — F331 Major depressive disorder, recurrent, moderate: Secondary | ICD-10-CM

## 2024-10-11 DIAGNOSIS — F4010 Social phobia, unspecified: Secondary | ICD-10-CM | POA: Diagnosis not present

## 2024-10-11 MED ORDER — BUPROPION HCL ER (XL) 300 MG PO TB24: 300.0000 mg | ORAL_TABLET | Freq: Every day | ORAL | 1 refills | Status: AC

## 2024-10-11 MED ORDER — TRAZODONE HCL 100 MG PO TABS: 100.0000 mg | ORAL_TABLET | Freq: Every evening | ORAL | 1 refills | Status: AC | PRN

## 2024-10-11 MED ORDER — LORAZEPAM 0.5 MG PO TABS: 0.5000 mg | ORAL_TABLET | Freq: Three times a day (TID) | ORAL | 0 refills | Status: AC | PRN

## 2024-10-11 MED ORDER — CLOMIPRAMINE HCL 50 MG PO CAPS: 50.0000 mg | ORAL_CAPSULE | Freq: Every day | ORAL | 1 refills | Status: AC

## 2024-10-11 NOTE — Progress Notes (Signed)
 " [facility] Virtual Progress Note   Patient Location: Home Provider Location: Home Office  I connect with patient by telephone and verified that I am speaking with correct person by using two identifiers. I discussed the limitations of evaluation and management by telemedicine and the availability of in person appointments. I also discussed with the patient that there may be a patient responsible charge related to this service. The patient expressed understanding and agreed to proceed.  Paul [facility] [id number] 54 y.o.  10/11/2024 2:44 PM  History of Present Illness:  Patient is evaluated by phone session.  He could not do video.  He started on Wellbutrin  and is taking 150 in the morning.  He noticed some improvement in his mood, motivation and anxiety.  He reported mornings are okay but in the afternoon he gets again very down, unmotivated and tired.  He tried to stop the trazodone  but he could not sleep.  He is also using CPAP.  Recently had a visit with primary care and labs are drawn.  So far tolerating his Wellbutrin  and reported no tremors, shakes or any EPS.  He denies any hallucination, paranoia, suicidal thoughts.  He is also taking Anafranil , Ativan .  He had a good [name] and holidays.  His appetite is okay.  Weight is unchanged from the past.  He denies drinking or using any illegal substances.  He admitted struggle with social anxiety and does not leave the house unless important.  So far no major panic attack but taking the Ativan  which is keeping his anxiety under control.  Past Psychiatric History: H/O anxiety and depression. [place] provider [facility] [place] but not happy with the treatment.  Saw Dr. Leila in 2018 and given Prozac , Xanax , Effexor , Celexa , propanolol, Lamictal , Vistaril , Adderall, Wellbutrin ,  Cymbalta  (increased irritability ), Anafranil , Pristiq , Trintellix  and Luvox  (did not help). H/O of ECT with no response.  Had genetic testing  in 2019.  Paxil, Lexapro, Celexa  and Tegretol had significant drug interaction.  No h/o suicidal attempt or inpatient.  At [facility] tried Viibryd , Klonopin  but did not work. H/O heavy drinking but claims to be sober.     Past Medical History:  Diagnosis Date   Anxiety    on meds   Depression    on meds   Elevated hemoglobin    Low testosterone     Sleep apnea    uses CPAP   Small bowel obstruction (HCC) 10/21/2016    Outpatient Encounter Medications as of 10/11/2024  Medication Sig   azelastine  (ASTELIN ) 0.1 % nasal spray Place 1 spray into both nostrils 2 (two) times daily. Use in each nostril as directed   buPROPion  (WELLBUTRIN  XL) 150 MG 24 hr tablet Take 1 tablet (150 mg total) by mouth daily.   clomiPRAMINE  (ANAFRANIL ) 50 MG capsule Take 1 capsule (50 mg total) by mouth at bedtime.   fenofibrate  (TRICOR ) 48 MG tablet Take 1 tablet (48 mg total) by mouth daily.   guaifenesin  (HUMIBID E) 400 MG TABS tablet Take 1 tablet (400 mg total) by mouth every 6 (six) hours as needed.   LORazepam  (ATIVAN ) 0.5 MG tablet Take 1 tablet (0.5 mg total) by mouth 3 (three) times daily as needed for anxiety.   Melatonin 10 MG TABS Take 1 tablet by mouth at bedtime.   metoprolol  tartrate (LOPRESSOR ) 50 MG tablet Take 1 tablet (50 mg total) by mouth 2 (two) times daily.   Multiple Vitamin (MULTI-VITAMIN DAILY PO) Take 1 capsule by mouth  2 (two) times daily. Name: Life Extensions   Omega-3 Fatty Acids (FISH OIL) 1000 MG CAPS Take 1 capsule by mouth 2 (two) times daily.   propranolol  (INDERAL ) 20 MG tablet Take 1 tablet (20 mg total) by mouth 4 (four) times daily. Only as needed for tachycardia/palpitations (Patient taking differently: Take 20 mg by mouth as needed (For fast heartrate). Only as needed for tachycardia/palpitations)   Risankizumab -rzaa (SKYRIZI  PEN) 150 MG/ML SOAJ Inject 150 mg into the skin as directed. Every 12 weeks for maintenance.   rosuvastatin  (CRESTOR ) 10 MG tablet Take 1 tablet  (10 mg total) by mouth daily.   sildenafil  (REVATIO ) 20 MG tablet TAKE 1 TABLET BY MOUTH ONCE DAILY AS NEEDED   SKYRIZI  150 MG/ML SOSY INJECT 150MG  SUBCUTANEOUSLY EVERY 12 WEEKS AS DIRECTED.   Testosterone  20.25 MG/ACT (1.62%) GEL Apply 20.25mg /ACT on one shoulder every morning (alternate shoulders every day).   traZODone  (DESYREL ) 100 MG tablet Take 1 tablet (100 mg total) by mouth at bedtime as needed for sleep.   No facility-administered encounter medications on file as of 10/11/2024.    Recent Results (from the past 2160 hours)  Bayer DCA Hb A1c Waived     Status: Abnormal   Collection Time: 08/13/24  9:08 AM  Result Value Ref Range   HB A1C (BAYER DCA - WAIVED) 5.7 (H) 4.8 - 5.6 %    Comment:          Prediabetes: 5.7 - 6.4          Diabetes: >6.4          Glycemic control for adults with diabetes: <7.0   Testosterone ,Free and Total     Status: None   Collection Time: 08/13/24  9:10 AM  Result Value Ref Range   Testosterone  425 264 - 916 ng/dL    Comment: Adult male reference interval is based on a population of healthy nonobese males (BMI <30) between 44 and 43 years old. Travison, et.al. JCEM [phone]. PMID: [id number].    Testosterone , Free 10.6 7.2 - 24.0 pg/mL  CMP14+EGFR     Status: Abnormal   Collection Time: 08/13/24  9:10 AM  Result Value Ref Range   Glucose 87 70 - 99 mg/dL   BUN 12 6 - 24 mg/dL   Creatinine, Ser 8.62 (H) 0.76 - 1.27 mg/dL   eGFR 62 >40 fO/fpw/8.26   BUN/Creatinine Ratio 9 9 - 20   Sodium 138 134 - 144 mmol/L   Potassium 4.4 3.5 - 5.2 mmol/L   Chloride 99 96 - 106 mmol/L   CO2 23 20 - 29 mmol/L   Calcium  9.7 8.7 - 10.2 mg/dL   Total Protein 7.7 6.0 - 8.5 g/dL   Albumin 4.9 3.8 - 4.9 g/dL   Globulin, Total 2.8 1.5 - 4.5 g/dL   Bilirubin Total 0.5 0.0 - 1.2 mg/dL   Alkaline Phosphatase 56 47 - 123 IU/L   AST 27 0 - 40 IU/L   ALT 32 0 - 44 IU/L  Lipid panel     Status: Abnormal   Collection Time: 08/13/24  9:10 AM  Result Value Ref  Range   Cholesterol, Total 148 100 - 199 mg/dL   Triglycerides 822 (H) 0 - 149 mg/dL   HDL 33 (L) >60 mg/dL   VLDL Cholesterol Cal 31 5 - 40 mg/dL   LDL Chol Calc (NIH) 84 0 - 99 mg/dL   Chol/HDL Ratio 4.5 0.0 - 5.0 ratio    Comment:  T. Chol/HDL Ratio                                             Men  Women                               1/2 Avg.Risk  3.4    3.3                                   Avg.Risk  5.0    4.4                                2X Avg.Risk  9.6    7.1                                3X Avg.Risk 23.4   11.0   CBC with Differential/Platelet     Status: Abnormal   Collection Time: 08/13/24  9:10 AM  Result Value Ref Range   WBC 6.9 3.4 - 10.8 x10E3/uL   RBC 5.80 4.14 - 5.80 x10E6/uL   Hemoglobin 17.8 (H) 13.0 - 17.7 g/dL   Hematocrit 46.7 (H) 62.4 - 51.0 %   MCV 92 79 - 97 fL   MCH 30.7 26.6 - 33.0 pg   MCHC 33.5 31.5 - 35.7 g/dL   RDW 86.6 88.3 - 84.5 %   Platelets 269 150 - 450 x10E3/uL   Neutrophils 43 Not Estab. %   Lymphs 45 Not Estab. %   Monocytes 9 Not Estab. %   Eos 2 Not Estab. %   Basos 1 Not Estab. %   Neutrophils Absolute 3.0 1.4 - 7.0 x10E3/uL   Lymphocytes Absolute 3.0 0.7 - 3.1 x10E3/uL   Monocytes Absolute 0.6 0.1 - 0.9 x10E3/uL   EOS (ABSOLUTE) 0.2 0.0 - 0.4 x10E3/uL   Basophils Absolute 0.1 0.0 - 0.2 x10E3/uL   Immature Granulocytes 0 Not Estab. %   Immature Grans (Abs) 0.0 0.0 - 0.1 x10E3/uL  PSA, total and free     Status: None   Collection Time: 08/13/24  9:10 AM  Result Value Ref Range   Prostate Specific Ag, Serum 0.9 0.0 - 4.0 ng/mL    Comment: Roche ECLIA methodology. According to the American Urological Association, Serum PSA should decrease and remain at undetectable levels after radical prostatectomy. The AUA defines biochemical recurrence as an initial PSA value 0.2 ng/mL or greater followed by a subsequent confirmatory PSA value 0.2 ng/mL or greater. Values obtained with different assay  methods or kits cannot be used interchangeably. Results cannot be interpreted as absolute evidence of the presence or absence of malignant disease.    PSA, Free 0.11 N/A ng/mL    Comment: Roche ECLIA methodology.   PSA, Free Pct 12.2 %    Comment: The table below lists the probability of prostate cancer for men with non-suspicious DRE results and total PSA between 4 and 10 ng/mL, by patient age Jaycee rosemarie cherry, JAMA 1998, [id number]).                   % Free PSA       50-64  yr        65-75 yr                   0.00-10.00%        56%             55%                  10.01-15.00%        24%             35%                  15.01-20.00%        17%             23%                  20.01-25.00%        10%             20%                       >25.00%         5%              9% Please note:  Catalona et al did not make specific               recommendations regarding the use of               percent free PSA for any other population               of men.      Psychiatric Specialty Exam: Physical Exam  Review of Systems  Weight 271 lb (122.9 kg).There is no height or weight on file to calculate BMI.  General Appearance: NA  Eye Contact:  NA  Speech:  Normal Rate  Volume:  Normal  Mood:  Dysphoric  Affect:  Congruent  Thought Process:  Goal Directed  Orientation:  Full (Time, Place, and Person)  Thought Content:  Logical  Suicidal Thoughts:  No  Homicidal Thoughts:  No  Memory:  Immediate;   Good Recent;   Good Remote;   Good  Judgement:  Good  Insight:  Present  Psychomotor Activity:  NA  Concentration:  Concentration: Good and Attention Span: Good  Recall:  Good  Fund of Knowledge:  Good  Language:  Good  Akathisia:  No  Handed:  Right  AIMS (if indicated):     Assets:  Communication Skills Desire for Improvement Housing  ADL's:  Intact  Cognition:  WNL  Sleep:  ok, need trazodone  and CPAP       08/19/2024    3:42 PM 02/19/2024    4:02 PM 12/11/2023    3:14 PM  08/21/2023    3:50 PM 02/20/2023    3:58 PM  Depression screen PHQ 2/9  Decreased Interest 1 1 1 2 1   Down, Depressed, Hopeless 1 1 1 2 1   PHQ - 2 Score 2 2 2 4 2   Altered sleeping 1 1 1 2 2   Tired, decreased energy 2 2 1 3 2   Change in appetite 2 1 0 1 1  Feeling bad or failure about yourself  0 0 1 0 1  Trouble concentrating 0 1 2 1 2   Moving slowly or fidgety/restless 0 1 2 1 1   Suicidal thoughts 0 0 1 0 0  PHQ-9 Score 7 8  10  12   11  Difficult doing work/chores Not difficult at all Very difficult Somewhat difficult Somewhat difficult Somewhat difficult     Data saved with a previous flowsheet row definition    Assessment/Plan: Moderate episode of recurrent major depressive disorder (HCC) - Plan: buPROPion  (WELLBUTRIN  XL) 300 MG 24 hr tablet, clomiPRAMINE  (ANAFRANIL ) 50 MG capsule, LORazepam  (ATIVAN ) 0.5 MG tablet  Social anxiety disorder - Plan: buPROPion  (WELLBUTRIN  XL) 300 MG 24 hr tablet, clomiPRAMINE  (ANAFRANIL ) 50 MG capsule, LORazepam  (ATIVAN ) 0.5 MG tablet, traZODone  (DESYREL ) 100 MG tablet  Panic attacks - Plan: buPROPion  (WELLBUTRIN  XL) 300 MG 24 hr tablet, clomiPRAMINE  (ANAFRANIL ) 50 MG capsule, LORazepam  (ATIVAN ) 0.5 MG tablet  Patient is 54 year old man with history of hypertension, tachycardia, hypogonadism, major depressive disorder, panic attacks and social anxiety disorder.  Reviewed blood work results from recent visit with primary care.  Currently taking Wellbutrin  XL 150.  Recommend to try 300 since he noticed mild improvement with the medication.  He is willing to give a try.  So far no tremor or shakes or any EPS.  To try to cut down trazodone  but could not sleep without it.  Once again emphasis given about multiple antidepressant can cause shakes and tremors but so far he is not having it.  Will keep the Anafranil  50 mg daily, Ativan  0.5 mg 2-3 times a day, Wellbutrin  increased 300 mg daily and trazodone  100 mg at bedtime.  Will consider lowering the dose of  Anafranil  if higher dose of Wellbutrin  works.  Encouraged to call back if is any question or any concern.  Follow-up in 2 months.   Follow Up Instructions:     I discussed the assessment and treatment plan with the patient. The patient was provided an opportunity to ask questions and all were answered. The patient agreed with the plan and demonstrated an understanding of the instructions.   The patient was advised to call back or seek an in-person evaluation if the symptoms worsen or if the condition fails to improve as anticipated.    Collaboration of Care: Other provider involved in patient's care AEB notes are available in epic to review  Patient/Guardian was advised Release of Information must be obtained prior to any record release in order to collaborate their care with an outside provider. Patient/Guardian was advised if they have not already done so to contact the registration department to sign all necessary forms in order for us  to release information regarding their care.   Consent: Patient/Guardian gives verbal consent for treatment and assignment of benefits for services provided during this visit. Patient/Guardian expressed understanding and agreed to proceed.     Total encounter time 17 minutes which includes face-to-face time, chart reviewed, care coordination, order entry and documentation during this encounter.   Note: This document was prepared by [employer] voice dictation technology and any errors that results from this process are unintentional.    Leni ONEIDA Client, MD 10/11/2024   "

## 2024-10-13 LAB — CBC WITH DIFFERENTIAL/PLATELET
Basophils Absolute: 0.1 x10E3/uL (ref 0.0–0.2)
Basos: 1 %
EOS (ABSOLUTE): 0.1 x10E3/uL (ref 0.0–0.4)
Eos: 2 %
Hematocrit: 49.4 % (ref 37.5–51.0)
Hemoglobin: 16 g/dL (ref 13.0–17.7)
Immature Grans (Abs): 0 x10E3/uL (ref 0.0–0.1)
Immature Granulocytes: 0 %
Lymphocytes Absolute: 3.2 x10E3/uL — ABNORMAL HIGH (ref 0.7–3.1)
Lymphs: 42 %
MCH: 30.5 pg (ref 26.6–33.0)
MCHC: 32.4 g/dL (ref 31.5–35.7)
MCV: 94 fL (ref 79–97)
Monocytes Absolute: 0.8 x10E3/uL (ref 0.1–0.9)
Monocytes: 10 %
Neutrophils Absolute: 3.3 x10E3/uL (ref 1.4–7.0)
Neutrophils: 45 %
Platelets: 283 x10E3/uL (ref 150–450)
RBC: 5.24 x10E6/uL (ref 4.14–5.80)
RDW: 13.4 % (ref 11.6–15.4)
WBC: 7.5 x10E3/uL (ref 3.4–10.8)

## 2024-10-13 LAB — TESTOSTERONE, FREE, TOTAL, SHBG
Sex Hormone Binding: 26.9 nmol/L (ref 19.3–76.4)
Testosterone, Free: 7.7 pg/mL (ref 7.2–24.0)
Testosterone: 275 ng/dL (ref 264–916)

## 2024-10-13 LAB — PSA: Prostate Specific Ag, Serum: 0.2 ng/mL (ref 0.0–4.0)

## 2024-10-19 ENCOUNTER — Encounter: Payer: Self-pay | Admitting: "Endocrinology

## 2024-10-19 ENCOUNTER — Ambulatory Visit: Admitting: "Endocrinology

## 2024-10-19 VITALS — BP 112/80 | HR 60 | Resp 18 | Ht 74.0 in | Wt 270.0 lb

## 2024-10-19 DIAGNOSIS — E291 Testicular hypofunction: Secondary | ICD-10-CM | POA: Diagnosis not present

## 2024-10-19 DIAGNOSIS — D751 Secondary polycythemia: Secondary | ICD-10-CM | POA: Diagnosis not present

## 2024-10-19 MED ORDER — TESTOSTERONE 20.25 MG/ACT (1.62%) TD GEL: TRANSDERMAL | 2 refills | Status: AC

## 2024-10-19 NOTE — Progress Notes (Signed)
 10/19/2024      Endocrinology follow-up note   HPI: Paul Parrish is a 54 y.o.-year-old man.  - He is being seen in follow-up for  hypogonadism.  He was diagnosed with hypogonadism in 2014. -Due to polycythemia, he undergoes phlebotomy periodically.  He is currently on testosterone  20.25 mg topical AndroGel  every day.    He presents with improved CBC profile, however his total testosterone  dropped to 275.  He feels more fatigued than before.  Patient wishes to continue on testosterone  treatment.    -He reports history of seizures as a young man until his teenage years. -He denies any history of head injury.  - He also is recently diagnosed with sleep apnea currently on CPAP.  - He fathers 2 teenage boys biologically. - He has significant mood disorder on multiple antidepressants and antianxiety. No herbal medicines. -He denies family history of premature coronary artery disease.   ROS: Limited as above.  PE: BP 112/80   Pulse 60   Resp 18   Ht 6' 2 (1.88 m)   Wt 270 lb (122.5 kg)   BMI 34.67 kg/m  Wt Readings from Last 3 Encounters:  10/19/24 270 lb (122.5 kg)  08/19/24 271 lb (122.9 kg)  06/23/24 271 lb (122.9 kg)     Recent Results (from the past 2160 hours)  Bayer DCA Hb A1c Waived     Status: Abnormal   Collection Time: 08/13/24  9:08 AM  Result Value Ref Range   HB A1C (BAYER DCA - WAIVED) 5.7 (H) 4.8 - 5.6 %    Comment:          Prediabetes: 5.7 - 6.4          Diabetes: >6.4          Glycemic control for adults with diabetes: <7.0   Testosterone ,Free and Total     Status: None   Collection Time: 08/13/24  9:10 AM  Result Value Ref Range   Testosterone  425 264 - 916 ng/dL    Comment: Adult male reference interval is based on a population of healthy nonobese males (BMI <30) between 105 and 65 years old. Travison, et.al. JCEM [phone]. PMID: 71675896.    Testosterone , Free 10.6 7.2 - 24.0 pg/mL  CMP14+EGFR     Status: Abnormal   Collection  Time: 08/13/24  9:10 AM  Result Value Ref Range   Glucose 87 70 - 99 mg/dL   BUN 12 6 - 24 mg/dL   Creatinine, Ser 8.62 (H) 0.76 - 1.27 mg/dL   eGFR 62 >40 fO/fpw/8.26   BUN/Creatinine Ratio 9 9 - 20   Sodium 138 134 - 144 mmol/L   Potassium 4.4 3.5 - 5.2 mmol/L   Chloride 99 96 - 106 mmol/L   CO2 23 20 - 29 mmol/L   Calcium  9.7 8.7 - 10.2 mg/dL   Total Protein 7.7 6.0 - 8.5 g/dL   Albumin 4.9 3.8 - 4.9 g/dL   Globulin, Total 2.8 1.5 - 4.5 g/dL   Bilirubin Total 0.5 0.0 - 1.2 mg/dL   Alkaline Phosphatase 56 47 - 123 IU/L   AST 27 0 - 40 IU/L   ALT 32 0 - 44 IU/L  Lipid panel     Status: Abnormal   Collection Time: 08/13/24  9:10 AM  Result Value Ref Range   Cholesterol, Total 148 100 - 199 mg/dL   Triglycerides 822 (H) 0 - 149 mg/dL   HDL 33 (L) >60 mg/dL   VLDL Cholesterol Cal 31 5 -  40 mg/dL   LDL Chol Calc (NIH) 84 0 - 99 mg/dL   Chol/HDL Ratio 4.5 0.0 - 5.0 ratio    Comment:                                   T. Chol/HDL Ratio                                             Men  Women                               1/2 Avg.Risk  3.4    3.3                                   Avg.Risk  5.0    4.4                                2X Avg.Risk  9.6    7.1                                3X Avg.Risk 23.4   11.0   CBC with Differential/Platelet     Status: Abnormal   Collection Time: 08/13/24  9:10 AM  Result Value Ref Range   WBC 6.9 3.4 - 10.8 x10E3/uL   RBC 5.80 4.14 - 5.80 x10E6/uL   Hemoglobin 17.8 (H) 13.0 - 17.7 g/dL   Hematocrit 46.7 (H) 62.4 - 51.0 %   MCV 92 79 - 97 fL   MCH 30.7 26.6 - 33.0 pg   MCHC 33.5 31.5 - 35.7 g/dL   RDW 86.6 88.3 - 84.5 %   Platelets 269 150 - 450 x10E3/uL   Neutrophils 43 Not Estab. %   Lymphs 45 Not Estab. %   Monocytes 9 Not Estab. %   Eos 2 Not Estab. %   Basos 1 Not Estab. %   Neutrophils Absolute 3.0 1.4 - 7.0 x10E3/uL   Lymphocytes Absolute 3.0 0.7 - 3.1 x10E3/uL   Monocytes Absolute 0.6 0.1 - 0.9 x10E3/uL   EOS (ABSOLUTE) 0.2 0.0 -  0.4 x10E3/uL   Basophils Absolute 0.1 0.0 - 0.2 x10E3/uL   Immature Granulocytes 0 Not Estab. %   Immature Grans (Abs) 0.0 0.0 - 0.1 x10E3/uL  PSA, total and free     Status: None   Collection Time: 08/13/24  9:10 AM  Result Value Ref Range   Prostate Specific Ag, Serum 0.9 0.0 - 4.0 ng/mL    Comment: Roche ECLIA methodology. According to the American Urological Association, Serum PSA should decrease and remain at undetectable levels after radical prostatectomy. The AUA defines biochemical recurrence as an initial PSA value 0.2 ng/mL or greater followed by a subsequent confirmatory PSA value 0.2 ng/mL or greater. Values obtained with different assay methods or kits cannot be used interchangeably. Results cannot be interpreted as absolute evidence of the presence or absence of malignant disease.    PSA, Free 0.11 N/A ng/mL    Comment: Roche ECLIA methodology.   PSA, Free Pct 12.2 %    Comment:  The table below lists the probability of prostate cancer for men with non-suspicious DRE results and total PSA between 4 and 10 ng/mL, by patient age Jaycee rosemarie cherry, JAMA 1998, [id number]).                   % Free PSA       50-64 yr        65-75 yr                   0.00-10.00%        56%             55%                  10.01-15.00%        24%             35%                  15.01-20.00%        17%             23%                  20.01-25.00%        10%             20%                       >25.00%         5%              9% Please note:  Catalona et al did not make specific               recommendations regarding the use of               percent free PSA for any other population               of men.   CBC with Differential/Platelet     Status: Abnormal   Collection Time: 10/11/24  9:20 AM  Result Value Ref Range   WBC 7.5 3.4 - 10.8 x10E3/uL   RBC 5.24 4.14 - 5.80 x10E6/uL   Hemoglobin 16.0 13.0 - 17.7 g/dL   Hematocrit 50.5 62.4 - 51.0 %   MCV 94 79 - 97 fL   MCH 30.5 26.6 -  33.0 pg   MCHC 32.4 31.5 - 35.7 g/dL   RDW 86.5 88.3 - 84.5 %   Platelets 283 150 - 450 x10E3/uL   Neutrophils 45 Not Estab. %   Lymphs 42 Not Estab. %   Monocytes 10 Not Estab. %   Eos 2 Not Estab. %   Basos 1 Not Estab. %   Neutrophils Absolute 3.3 1.4 - 7.0 x10E3/uL   Lymphocytes Absolute 3.2 (H) 0.7 - 3.1 x10E3/uL   Monocytes Absolute 0.8 0.1 - 0.9 x10E3/uL   EOS (ABSOLUTE) 0.1 0.0 - 0.4 x10E3/uL   Basophils Absolute 0.1 0.0 - 0.2 x10E3/uL   Immature Granulocytes 0 Not Estab. %   Immature Grans (Abs) 0.0 0.0 - 0.1 x10E3/uL  PSA     Status: None   Collection Time: 10/11/24  9:20 AM  Result Value Ref Range   Prostate Specific Ag, Serum 0.2 0.0 - 4.0 ng/mL    Comment: Roche ECLIA methodology. According to the American Urological Association, Serum PSA should decrease and remain at undetectable levels after radical prostatectomy. The AUA defines biochemical recurrence as an initial  PSA value 0.2 ng/mL or greater followed by a subsequent confirmatory PSA value 0.2 ng/mL or greater. Values obtained with different assay methods or kits cannot be used interchangeably. Results cannot be interpreted as absolute evidence of the presence or absence of malignant disease.   Testosterone , Free, Total, SHBG     Status: None   Collection Time: 10/11/24  9:20 AM  Result Value Ref Range   Testosterone  275 264 - 916 ng/dL    Comment: Adult male reference interval is based on a population of healthy nonobese males (BMI <30) between 6 and 76 years old. Travison, et.al. JCEM [phone]. PMID: 71675896.    Testosterone , Free 7.7 7.2 - 24.0 pg/mL   Sex Hormone Binding 26.9 19.3 - 76.4 nmol/L      ASSESSMENT: 1.  Hypogonadism  2.  Polycythemia 3.  Prediabetes  PLAN:  1. Hypogonadism  -His last phlebotomy was in January 2025.  He presents with corrected CBC profile, however dropping total testosterone .  He is advised to increase AndroGel  20.25 mg topically on both shoulders every  day until next measurement.  This will give him 40.50 mg of testosterone  daily.   His testosterone  will be optimized based on his labs and CBC response. His history of  secondary polycythemia which continues to require periodic phlebotomy, as well as sleep apnea requiring CPAP treatment making him a poor candidate for more generous testosterone  replacement. Regarding his weight concern: His insurance did not provide coverage for injectable weight loss medications. He remains a good fit for lifestyle medicine.  - he acknowledges that there is a room for improvement in his food and drink choices. - Suggestion is made for him to avoid simple carbohydrates  from his diet including Cakes, Sweet Desserts, Ice Cream, Soda (diet and regular), Sweet Tea, Candies, Chips, Cookies, Store Bought Juices, Alcohol , Artificial Sweeteners,  Coffee Creamer, and Sugar-free Products, Lemonade. This will help patient to have more stable blood glucose profile and potentially avoid unintended weight gain.  The following Lifestyle Medicine recommendations according to American College of Lifestyle Medicine  [facility]) were discussed and and offered to patient and he  agrees to start the journey:  A. Whole Foods, Plant-Based Nutrition comprising of fruits and vegetables, plant-based proteins, whole-grain carbohydrates was discussed in detail with the patient.   A list for source of those nutrients were also provided to the patient.  Patient will use only water or unsweetened tea for hydration. B.  The need to stay away from risky substances including alcohol, smoking; obtaining 7 to 9 hours of restorative sleep, at least 150 minutes of moderate intensity exercise weekly, the importance of healthy social connections,  and stress management techniques were discussed. C.  A full color page of  Calorie density of various food groups per pound showing examples of each food groups was provided to the patient.   He is advised to  continue close follow-up with his PMD for primary care needs.   I spent  20  minutes in the care of the patient today including review of labs from Thyroid  Function, CMP, and other relevant labs ; imaging/biopsy records (current and previous including abstractions from other facilities); face-to-face time discussing  his lab results and symptoms, medications doses, his options of short and long term treatment based on the latest standards of care / guidelines;   and documenting the encounter.  Oneil Lunger  participated in the discussions, expressed understanding, and voiced agreement with the above plans.  All questions were  answered to his satisfaction. he is encouraged to contact clinic should he have any questions or concerns prior to his return visit. Dear Patient: Feel free to review your progress notes.  If you are reviewing this progress note and have questions about the meaning of /or medical terms being used, please make a note and address it at your next follow-up appointment.  Medical notes are meant to be a communication tool between medical professionals and require medical terms to be used for efficiency and insurance approval.    Ranny Earl, MD Phone: [phone]  Fax: [phone]  -  This note was partially dictated with voice recognition software. Similar sounding words can be transcribed inadequately or may not  be corrected upon review.  10/19/2024, 5:38 PM

## 2024-11-01 ENCOUNTER — Other Ambulatory Visit: Payer: Self-pay | Admitting: Physician Assistant

## 2024-11-01 MED ORDER — METOPROLOL TARTRATE 50 MG PO TABS: 50.0000 mg | ORAL_TABLET | Freq: Two times a day (BID) | ORAL | 3 refills | Status: AC

## 2024-12-09 ENCOUNTER — Ambulatory Visit: Admitting: Internal Medicine

## 2024-12-13 ENCOUNTER — Telehealth (HOSPITAL_COMMUNITY): Admitting: Psychiatry

## 2025-02-09 ENCOUNTER — Other Ambulatory Visit

## 2025-02-16 ENCOUNTER — Encounter: Admitting: Family Medicine

## 2025-02-23 ENCOUNTER — Ambulatory Visit: Admitting: "Endocrinology
